# Patient Record
Sex: Female | Born: 1960 | Race: Black or African American | Hispanic: No | Marital: Single | Smoking: Current every day smoker
Health system: Southern US, Community
[De-identification: ages and names within clinical notes are randomized; demographics above are authoritative.]

## PROBLEM LIST (undated history)

## (undated) DIAGNOSIS — H15003 Unspecified scleritis, bilateral: Secondary | ICD-10-CM

## (undated) DIAGNOSIS — E876 Hypokalemia: Secondary | ICD-10-CM

## (undated) DIAGNOSIS — T884XXA Failed or difficult intubation, initial encounter: Secondary | ICD-10-CM

## (undated) DIAGNOSIS — H409 Unspecified glaucoma: Secondary | ICD-10-CM

## (undated) DIAGNOSIS — R011 Cardiac murmur, unspecified: Secondary | ICD-10-CM

## (undated) DIAGNOSIS — D689 Coagulation defect, unspecified: Secondary | ICD-10-CM

## (undated) DIAGNOSIS — K219 Gastro-esophageal reflux disease without esophagitis: Secondary | ICD-10-CM

## (undated) DIAGNOSIS — M1990 Unspecified osteoarthritis, unspecified site: Secondary | ICD-10-CM

## (undated) DIAGNOSIS — K5900 Constipation, unspecified: Secondary | ICD-10-CM

## (undated) DIAGNOSIS — Z86718 Personal history of other venous thrombosis and embolism: Secondary | ICD-10-CM

## (undated) DIAGNOSIS — M7989 Other specified soft tissue disorders: Secondary | ICD-10-CM

## (undated) DIAGNOSIS — F259 Schizoaffective disorder, unspecified: Secondary | ICD-10-CM

## (undated) DIAGNOSIS — L732 Hidradenitis suppurativa: Secondary | ICD-10-CM

## (undated) DIAGNOSIS — E119 Type 2 diabetes mellitus without complications: Secondary | ICD-10-CM

## (undated) DIAGNOSIS — F32A Depression, unspecified: Secondary | ICD-10-CM

## (undated) DIAGNOSIS — F419 Anxiety disorder, unspecified: Secondary | ICD-10-CM

## (undated) DIAGNOSIS — G709 Myoneural disorder, unspecified: Secondary | ICD-10-CM

## (undated) DIAGNOSIS — F329 Major depressive disorder, single episode, unspecified: Secondary | ICD-10-CM

## (undated) DIAGNOSIS — I10 Essential (primary) hypertension: Secondary | ICD-10-CM

## (undated) DIAGNOSIS — M359 Systemic involvement of connective tissue, unspecified: Secondary | ICD-10-CM

## (undated) DIAGNOSIS — G4730 Sleep apnea, unspecified: Secondary | ICD-10-CM

## (undated) DIAGNOSIS — E785 Hyperlipidemia, unspecified: Secondary | ICD-10-CM

## (undated) DIAGNOSIS — K2970 Gastritis, unspecified, without bleeding: Secondary | ICD-10-CM

## (undated) DIAGNOSIS — T8859XA Other complications of anesthesia, initial encounter: Secondary | ICD-10-CM

## (undated) DIAGNOSIS — I82409 Acute embolism and thrombosis of unspecified deep veins of unspecified lower extremity: Secondary | ICD-10-CM

## (undated) DIAGNOSIS — E2601 Conn's syndrome: Secondary | ICD-10-CM

## (undated) DIAGNOSIS — F429 Obsessive-compulsive disorder, unspecified: Secondary | ICD-10-CM

## (undated) DIAGNOSIS — G5600 Carpal tunnel syndrome, unspecified upper limb: Secondary | ICD-10-CM

## (undated) DIAGNOSIS — N289 Disorder of kidney and ureter, unspecified: Secondary | ICD-10-CM

## (undated) DIAGNOSIS — D6861 Antiphospholipid syndrome: Secondary | ICD-10-CM

## (undated) HISTORY — DX: Obsessive-compulsive disorder, unspecified: F42.9

## (undated) HISTORY — DX: Disorder of kidney and ureter, unspecified: N28.9

## (undated) HISTORY — PX: BUNIONECTOMY: SHX129

## (undated) HISTORY — DX: Unspecified osteoarthritis, unspecified site: M19.90

## (undated) HISTORY — DX: Anxiety disorder, unspecified: F41.9

## (undated) HISTORY — DX: Type 2 diabetes mellitus without complications: E11.9

## (undated) HISTORY — DX: Other specified soft tissue disorders: M79.89

## (undated) HISTORY — DX: Personal history of other venous thrombosis and embolism: Z86.718

## (undated) HISTORY — DX: Constipation, unspecified: K59.00

## (undated) HISTORY — DX: Schizoaffective disorder, unspecified: F25.9

## (undated) HISTORY — PX: LYMPH NODE DISSECTION: SHX5087

## (undated) HISTORY — DX: Hyperlipidemia, unspecified: E78.5

## (undated) HISTORY — DX: Unspecified scleritis, bilateral: H15.003

## (undated) HISTORY — DX: Coagulation defect, unspecified: D68.9

## (undated) HISTORY — DX: Systemic involvement of connective tissue, unspecified: M35.9

## (undated) HISTORY — DX: Carpal tunnel syndrome, unspecified upper limb: G56.00

## (undated) HISTORY — PX: CERVICAL CONIZATION W/BX: SHX1330

## (undated) HISTORY — DX: Conn's syndrome: E26.01

---
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
## 2001-01-20 ENCOUNTER — Other Ambulatory Visit (HOSPITAL_COMMUNITY): Admission: RE | Admit: 2001-01-20 | Discharge: 2001-02-01 | Payer: Self-pay | Admitting: *Deleted

## 2001-12-23 ENCOUNTER — Emergency Department (HOSPITAL_COMMUNITY): Admission: EM | Admit: 2001-12-23 | Discharge: 2001-12-23 | Payer: Self-pay | Admitting: Emergency Medicine

## 2010-11-10 ENCOUNTER — Inpatient Hospital Stay (INDEPENDENT_AMBULATORY_CARE_PROVIDER_SITE_OTHER)
Admission: RE | Admit: 2010-11-10 | Discharge: 2010-11-10 | Disposition: A | Discharge status: Home / Self Care | Payer: Medicare Other | Source: Ambulatory Visit | Attending: Emergency Medicine | Admitting: Emergency Medicine

## 2010-11-10 DIAGNOSIS — G609 Hereditary and idiopathic neuropathy, unspecified: Secondary | ICD-10-CM

## 2010-11-10 LAB — POCT I-STAT, CHEM 8
BUN: 9 mg/dL (ref 6–23)
Chloride: 103 mEq/L (ref 96–112)
HCT: 44 % (ref 36.0–46.0)
Sodium: 141 mEq/L (ref 135–145)

## 2010-11-11 LAB — VITAMIN B12: Vitamin B-12: 1144 pg/mL — ABNORMAL HIGH (ref 211–911)

## 2010-11-19 ENCOUNTER — Emergency Department (HOSPITAL_COMMUNITY)
Admission: EM | Admit: 2010-11-19 | Discharge: 2010-11-19 | Disposition: A | Discharge status: Home / Self Care | Payer: Medicare Other | Attending: Emergency Medicine | Admitting: Emergency Medicine

## 2010-11-19 DIAGNOSIS — H409 Unspecified glaucoma: Secondary | ICD-10-CM | POA: Insufficient documentation

## 2010-11-19 DIAGNOSIS — I10 Essential (primary) hypertension: Secondary | ICD-10-CM | POA: Insufficient documentation

## 2010-11-19 DIAGNOSIS — Z79899 Other long term (current) drug therapy: Secondary | ICD-10-CM | POA: Insufficient documentation

## 2010-11-19 DIAGNOSIS — R209 Unspecified disturbances of skin sensation: Secondary | ICD-10-CM | POA: Insufficient documentation

## 2010-11-19 DIAGNOSIS — G5690 Unspecified mononeuropathy of unspecified upper limb: Secondary | ICD-10-CM | POA: Insufficient documentation

## 2012-04-19 DIAGNOSIS — I82409 Acute embolism and thrombosis of unspecified deep veins of unspecified lower extremity: Secondary | ICD-10-CM

## 2012-04-19 HISTORY — DX: Acute embolism and thrombosis of unspecified deep veins of unspecified lower extremity: I82.409

## 2012-06-10 ENCOUNTER — Emergency Department (HOSPITAL_COMMUNITY)
Admission: EM | Admit: 2012-06-10 | Discharge: 2012-06-10 | Disposition: A | Discharge status: Home / Self Care | Payer: Medicare Other | Attending: Emergency Medicine | Admitting: Emergency Medicine

## 2012-06-10 ENCOUNTER — Encounter (HOSPITAL_COMMUNITY): Payer: Self-pay

## 2012-06-10 DIAGNOSIS — Z8669 Personal history of other diseases of the nervous system and sense organs: Secondary | ICD-10-CM | POA: Insufficient documentation

## 2012-06-10 DIAGNOSIS — I809 Phlebitis and thrombophlebitis of unspecified site: Secondary | ICD-10-CM | POA: Insufficient documentation

## 2012-06-10 DIAGNOSIS — I8000 Phlebitis and thrombophlebitis of superficial vessels of unspecified lower extremity: Secondary | ICD-10-CM

## 2012-06-10 DIAGNOSIS — Z8719 Personal history of other diseases of the digestive system: Secondary | ICD-10-CM | POA: Insufficient documentation

## 2012-06-10 DIAGNOSIS — F17200 Nicotine dependence, unspecified, uncomplicated: Secondary | ICD-10-CM | POA: Insufficient documentation

## 2012-06-10 DIAGNOSIS — I10 Essential (primary) hypertension: Secondary | ICD-10-CM | POA: Insufficient documentation

## 2012-06-10 DIAGNOSIS — M79609 Pain in unspecified limb: Secondary | ICD-10-CM

## 2012-06-10 DIAGNOSIS — I8390 Asymptomatic varicose veins of unspecified lower extremity: Secondary | ICD-10-CM | POA: Insufficient documentation

## 2012-06-10 DIAGNOSIS — R209 Unspecified disturbances of skin sensation: Secondary | ICD-10-CM | POA: Insufficient documentation

## 2012-06-10 DIAGNOSIS — Z8659 Personal history of other mental and behavioral disorders: Secondary | ICD-10-CM | POA: Insufficient documentation

## 2012-06-10 HISTORY — DX: Gastritis, unspecified, without bleeding: K29.70

## 2012-06-10 HISTORY — DX: Essential (primary) hypertension: I10

## 2012-06-10 HISTORY — DX: Schizoaffective disorder, unspecified: F25.9

## 2012-06-10 HISTORY — DX: Unspecified glaucoma: H40.9

## 2012-06-10 HISTORY — DX: Depression, unspecified: F32.A

## 2012-06-10 HISTORY — DX: Major depressive disorder, single episode, unspecified: F32.9

## 2012-06-10 MED ORDER — IBUPROFEN 800 MG PO TABS: 800.0000 mg | ORAL_TABLET | Freq: Three times a day (TID) | ORAL | Status: DC

## 2012-06-10 MED ORDER — HYDROCODONE-ACETAMINOPHEN 5-325 MG PO TABS: 2.0000 | ORAL_TABLET | ORAL | Status: DC | PRN

## 2012-06-10 MED ORDER — CEPHALEXIN 500 MG PO CAPS: 500.0000 mg | ORAL_CAPSULE | Freq: Four times a day (QID) | ORAL | Status: DC

## 2012-06-10 NOTE — ED Notes (Signed)
Patient presents with possible ruptured varicose vein to left lower extremity with tenderness, erythema since Friday evening.  DP and PT pulses 2+, patient ambulatory.  No history of DVT or blood clotting, positive family history.

## 2012-06-10 NOTE — ED Notes (Signed)
Pt discharged by another ED nurse

## 2012-06-10 NOTE — ED Provider Notes (Signed)
History     CSN: [id number]  Arrival date & time 06/10/12  0745   First MD Initiated Contact with Patient 06/10/12 [phone]      Chief Complaint  Patient presents with  . Leg Swelling    (Consider location/radiation/quality/duration/timing/severity/associated sxs/prior treatment) Patient is a 52 y.o. female presenting with extremity pain. The history is provided by the patient.  Extremity Pain This is a new problem. The current episode started yesterday. The problem has been gradually worsening. Associated symptoms include numbness. Pertinent negatives include no chest pain, diaphoresis or fever. Associated symptoms comments: Patient started having pain at 9pm located in the location of a chronic varicosity in her left lower leg. She denies having symptoms like this before. New numbness in left foot that began at 2am. She called her insurance nurse who told her to elevate her leg and come to the ER if no improvement. . The symptoms are aggravated by walking. Treatments tried: elevation of extremity. The treatment provided no relief.    Past Medical History  Diagnosis Date  . Hypertension   . Glaucoma   . Gastritis   . Schizo-affective psychosis   . Depression     Past Surgical History  Procedure Laterality Date  . Cervical conization w/bx    . Bunionectomy      No family history on file.  History  Substance Use Topics  . Smoking status: Current Every Day Smoker  . Smokeless tobacco: Not on file  . Alcohol Use: Yes    OB History   Grav Para Term Preterm Abortions TAB SAB Ect Mult Living                  Review of Systems  Constitutional: Negative for fever and diaphoresis.  Respiratory: Negative for shortness of breath.   Cardiovascular: Negative for chest pain.  Neurological: Positive for numbness.    Allergies  Ace inhibitors and Travatan  Home Medications  No current outpatient prescriptions on file.  BP 125/75  Temp(Src) 97.7 F (36.5 C) (Oral)  SpO2  98%  Physical Exam  Constitutional: She appears well-developed.  HENT:  Head: Normocephalic and atraumatic.  Neck: No tracheal deviation present.  Cardiovascular: Normal rate, regular rhythm, normal heart sounds and intact distal pulses.  Exam reveals no gallop and no friction rub.   No murmur heard. Pulmonary/Chest: Effort normal and breath sounds normal. No respiratory distress. She has no wheezes. She has no rales.  Musculoskeletal: She exhibits tenderness.  Tenderness on left posterior calf - tenderness over mildly erythematous 3cm lump on medial left lower leg  Neurological: She is alert.  Skin: Skin is warm and dry. There is erythema.  Mild erythema over left medial calf  Psychiatric: She has a normal mood and affect. Her behavior is normal.    ED Course  Procedures (including critical care time)  Labs Reviewed - No data to display No results found.   No diagnosis found. Superficial thrombophebitis Hx of varicose veins    MDM  Patient declines pain medications for now.   Venous doppler done and DVT ruled out. Evidence of superficial thrombophlebitis. Dr. Effie Shy in to see patient. Will advise warm compresses, elevation, anti-inflammatories. Will cover with an antibiotic and encourage follow up.   Mora Bellman, PA-C 06/10/12 1000

## 2012-06-10 NOTE — ED Provider Notes (Signed)
Jeanne [name] is a 52 y.o. female who is here for evaluation of left leg pain, with swelling. Onset is acute and pain is persistent.   Exam: Tender, red nodule, left medial calf. No fluctuance.  Doppler left leg. Negative for DVT  Evaluation is consistent with superficial thrombophlebitis.   Medical screening examination/treatment/procedure(s) were conducted as a shared visit with non-physician practitioner(s) and myself.  I personally evaluated the patient during the encounter  Flint Melter, MD 06/11/12 [phone]

## 2012-06-10 NOTE — Progress Notes (Signed)
VASCULAR LAB PRELIMINARY  PRELIMINARY  PRELIMINARY  PRELIMINARY  Left lower extremity venous Doppler completed.    Preliminary report:  There is no DVT noted in the left lower extremity.  There is superficial thrombosis noted in the greater saphenous vein at site of knot and tenderness.  Christyan Reger, RVT 06/10/2012, 9:20 AM

## 2012-06-19 ENCOUNTER — Encounter (INDEPENDENT_AMBULATORY_CARE_PROVIDER_SITE_OTHER): Payer: Medicare Other | Admitting: *Deleted

## 2012-06-19 DIAGNOSIS — I80299 Phlebitis and thrombophlebitis of other deep vessels of unspecified lower extremity: Secondary | ICD-10-CM

## 2012-06-19 DIAGNOSIS — R609 Edema, unspecified: Secondary | ICD-10-CM

## 2012-12-05 ENCOUNTER — Emergency Department (HOSPITAL_COMMUNITY)
Admission: EM | Admit: 2012-12-05 | Discharge: 2012-12-05 | Disposition: A | Discharge status: Home / Self Care | Payer: Medicare Other | Attending: Emergency Medicine | Admitting: Emergency Medicine

## 2012-12-05 ENCOUNTER — Encounter (HOSPITAL_COMMUNITY): Payer: Self-pay | Admitting: *Deleted

## 2012-12-05 ENCOUNTER — Emergency Department (HOSPITAL_COMMUNITY): Payer: Medicare Other

## 2012-12-05 DIAGNOSIS — Z8669 Personal history of other diseases of the nervous system and sense organs: Secondary | ICD-10-CM | POA: Insufficient documentation

## 2012-12-05 DIAGNOSIS — R202 Paresthesia of skin: Secondary | ICD-10-CM

## 2012-12-05 DIAGNOSIS — E876 Hypokalemia: Secondary | ICD-10-CM

## 2012-12-05 DIAGNOSIS — F259 Schizoaffective disorder, unspecified: Secondary | ICD-10-CM | POA: Insufficient documentation

## 2012-12-05 DIAGNOSIS — Z8719 Personal history of other diseases of the digestive system: Secondary | ICD-10-CM | POA: Insufficient documentation

## 2012-12-05 DIAGNOSIS — I10 Essential (primary) hypertension: Secondary | ICD-10-CM | POA: Insufficient documentation

## 2012-12-05 DIAGNOSIS — R42 Dizziness and giddiness: Secondary | ICD-10-CM | POA: Insufficient documentation

## 2012-12-05 DIAGNOSIS — Z79899 Other long term (current) drug therapy: Secondary | ICD-10-CM | POA: Insufficient documentation

## 2012-12-05 DIAGNOSIS — F3289 Other specified depressive episodes: Secondary | ICD-10-CM | POA: Insufficient documentation

## 2012-12-05 DIAGNOSIS — F329 Major depressive disorder, single episode, unspecified: Secondary | ICD-10-CM | POA: Insufficient documentation

## 2012-12-05 DIAGNOSIS — F17200 Nicotine dependence, unspecified, uncomplicated: Secondary | ICD-10-CM | POA: Insufficient documentation

## 2012-12-05 DIAGNOSIS — R209 Unspecified disturbances of skin sensation: Secondary | ICD-10-CM | POA: Insufficient documentation

## 2012-12-05 DIAGNOSIS — Z86718 Personal history of other venous thrombosis and embolism: Secondary | ICD-10-CM | POA: Insufficient documentation

## 2012-12-05 DIAGNOSIS — R109 Unspecified abdominal pain: Secondary | ICD-10-CM | POA: Insufficient documentation

## 2012-12-05 DIAGNOSIS — Z7901 Long term (current) use of anticoagulants: Secondary | ICD-10-CM | POA: Insufficient documentation

## 2012-12-05 HISTORY — DX: Acute embolism and thrombosis of unspecified deep veins of unspecified lower extremity: I82.409

## 2012-12-05 LAB — COMPREHENSIVE METABOLIC PANEL
AST: 28 U/L (ref 0–37)
Albumin: 3.5 g/dL (ref 3.5–5.2)
Alkaline Phosphatase: 119 U/L — ABNORMAL HIGH (ref 39–117)
CO2: 30 mEq/L (ref 19–32)
Chloride: 102 mEq/L (ref 96–112)
Creatinine, Ser: 0.54 mg/dL (ref 0.50–1.10)
GFR calc non Af Amer: >90 mL/min (ref >90)
Potassium: 2.8 mEq/L — ABNORMAL LOW (ref 3.5–5.1)
Total Bilirubin: 0.4 mg/dL (ref 0.3–1.2)

## 2012-12-05 LAB — URINALYSIS, ROUTINE W REFLEX MICROSCOPIC
Bilirubin Urine: NEGATIVE
Glucose, UA: NEGATIVE mg/dL
Hgb urine dipstick: NEGATIVE
Specific Gravity, Urine: 1.011 (ref 1.005–1.030)
Urobilinogen, UA: 0.2 mg/dL (ref 0.0–1.0)
pH: 7.5 (ref 5.0–8.0)

## 2012-12-05 LAB — CBC WITH DIFFERENTIAL/PLATELET
Basophils Absolute: 0 10*3/uL (ref 0.0–0.1)
HCT: 41.9 % (ref 36.0–46.0)
Hemoglobin: 14.9 g/dL (ref 12.0–15.0)
Lymphocytes Relative: 31 % (ref 12–46)
Monocytes Absolute: 0.5 10*3/uL (ref 0.1–1.0)
Monocytes Relative: 12 % (ref 3–12)
Neutro Abs: 2.5 10*3/uL (ref 1.7–7.7)
Neutrophils Relative %: 54 % (ref 43–77)
RDW: 13.4 % (ref 11.5–15.5)
WBC: 4.7 10*3/uL (ref 4.0–10.5)

## 2012-12-05 LAB — PROTIME-INR: INR: 1.11 (ref 0.00–1.49)

## 2012-12-05 MED ORDER — POTASSIUM CHLORIDE CRYS ER 20 MEQ PO TBCR
40.0000 meq | EXTENDED_RELEASE_TABLET | Freq: Once | ORAL | Status: AC
  Administered 2012-12-05: 40 meq via ORAL
  Filled 2012-12-05: qty 2

## 2012-12-05 MED ORDER — POTASSIUM CHLORIDE ER 10 MEQ PO TBCR: 10.0000 meq | EXTENDED_RELEASE_TABLET | ORAL | Status: DC

## 2012-12-05 MED ORDER — SODIUM CHLORIDE 0.9 % IV SOLN
Freq: Once | INTRAVENOUS | Status: AC
  Administered 2012-12-05: 03:00:00 via INTRAVENOUS

## 2012-12-05 NOTE — ED Provider Notes (Signed)
CSN: [id number]     Arrival date & time 12/05/12  0243 History     First MD Initiated Contact with Patient 12/05/12 0257     Chief Complaint  Patient presents with  . Abdominal Pain  . facial and neck numbness    (Consider location/radiation/quality/duration/timing/severity/associated sxs/prior Treatment) HPI Comments: Patient states she was awakened about midnight with right sided pain, and bloating.  This has since resolved, but now she is, "sore" but now the patient.  Has noticed, that her chin, and anterior neck, is numb , and has slight dizziness: denies any recent fevers, URI, symptoms, UTI symptoms, chest pain, shortness of breath.  Cold trauma visual disturbance, headache.  Patient is a 52 y.o. female presenting with abdominal pain. The history is provided by the patient.  Abdominal Pain Pain location: Right side. Pain quality: aching and bloating   Pain radiates to:  Does not radiate Pain severity:  Mild Onset quality:  Sudden Timing:  Unable to specify Progression:  Improving Chronicity:  New Relieved by: Time. Worsened by:  Nothing tried Ineffective treatments:  None tried Associated symptoms: no anorexia, no belching, no chest pain, no chills, no cough, no diarrhea, no dysuria, no fatigue, no fever, no flatus, no hematemesis, no melena, no nausea, no shortness of breath, no sore throat, no vaginal bleeding and no vaginal discharge   Associated symptoms comment:  Now has numbness of her chin, and anterior neck   Past Medical History  Diagnosis Date  . Hypertension   . Glaucoma   . Gastritis   . Schizo-affective psychosis   . Depression   . DVT (deep venous thrombosis)     on Xaralto   Past Surgical History  Procedure Laterality Date  . Cervical conization w/bx    . Bunionectomy     No family history on file. History  Substance Use Topics  . Smoking status: Current Every Day Smoker  . Smokeless tobacco: Not on file  . Alcohol Use: Yes   OB History   Grav Para Term Preterm Abortions TAB SAB Ect Mult Living                 Review of Systems  Constitutional: Negative for fever, chills and fatigue.  HENT: Negative for sore throat, neck pain and neck stiffness.   Respiratory: Negative for cough and shortness of breath.   Cardiovascular: Negative for chest pain.  Gastrointestinal: Positive for abdominal pain. Negative for nausea, diarrhea, melena, anorexia, flatus and hematemesis.  Genitourinary: Negative for dysuria, vaginal bleeding and vaginal discharge.  Neurological: Positive for dizziness. Negative for facial asymmetry, speech difficulty, weakness and headaches.    Allergies  Ace inhibitors and Travatan  Home Medications   Current Outpatient Rx  Name  Route  Sig  Dispense  Refill  . amLODipine (NORVASC) 5 MG tablet   Oral   Take 5 mg by mouth daily.         . ARIPiprazole (ABILIFY) 30 MG tablet   Oral   Take 30 mg by mouth daily.         . celecoxib (CELEBREX) 200 MG capsule   Oral   Take 200 mg by mouth daily.         [name] desvenlafaxine (PRISTIQ) 50 MG 24 hr tablet   Oral   Take 50 mg by mouth daily.         [name] eplerenone (INSPRA) 50 MG tablet   Oral   Take 50 mg by mouth 2 (two) times daily.         [name]  esomeprazole (NEXIUM) 40 MG capsule   Oral   Take 40 mg by mouth daily before breakfast.         . folic acid (FOLVITE) 1 MG tablet   Oral   Take 1 mg by mouth daily.         [name] latanoprost (XALATAN) 0.005 % ophthalmic solution   Both Eyes   Place 1 drop into both eyes at bedtime.         . methotrexate (RHEUMATREX) 2.5 MG tablet   Oral   Take 10 mg by mouth every 7 (seven) days. Mondays         . NIFEdipine (PROCARDIA XL) 60 MG 24 hr tablet   Oral   Take 120 mg by mouth daily.         . QUEtiapine (SEROQUEL) 300 MG tablet   Oral   Take 600 mg by mouth at bedtime.         . Rivaroxaban (XARELTO) 20 MG TABS tablet   Oral   Take 20 mg by mouth daily.         . verapamil  (CALAN-SR) 240 MG CR tablet   Oral   Take 240 mg by mouth daily.          . potassium chloride (K-DUR) 10 MEQ tablet   Oral   Take 1 tablet (10 mEq total) by mouth 1 day or 1 dose.   4 tablet   0    BP 125/56  Pulse 72  Temp(Src) 98.6 F (37 C) (Oral)  Resp 11  SpO2 99% Physical Exam  Nursing note and vitals reviewed. Constitutional: She appears well-developed and well-nourished.  HENT:  Head: Normocephalic.  Left Ear: External ear normal.  Eyes: Pupils are equal, round, and reactive to light.  Neck: Normal range of motion.  Cardiovascular: Normal rate and regular rhythm.   Pulmonary/Chest: Effort normal and breath sounds normal.  Abdominal: Soft. Bowel sounds are normal. She exhibits no distension. There is tenderness in the right upper quadrant and epigastric area.  Mild tenderness  Musculoskeletal: Normal range of motion.  Neurological: She is alert.  Skin: Skin is warm.    ED Course   Procedures (including critical care time)  Labs Reviewed  COMPREHENSIVE METABOLIC PANEL - Abnormal; Notable for the following:    Potassium 2.8 (*)    Glucose, Bld 122 (*)    Alkaline Phosphatase 119 (*)    All other components within normal limits  URINALYSIS, ROUTINE W REFLEX MICROSCOPIC - Abnormal; Notable for the following:    APPearance HAZY (*)    All other components within normal limits  CBC WITH DIFFERENTIAL  PROTIME-INR  TROPONIN I   Dg Chest 2 View  12/05/2012   *RADIOLOGY REPORT*  Clinical Data: Right-sided chest pain.  CHEST - 2 VIEW  Comparison: None.  Findings: Shallow inspiration.  Mild cardiac enlargement. Pulmonary vascularity is borderline but probably normal for technique.  No focal consolidation or airspace disease.  No blunting of costophrenic angles.  No pneumothorax.  Mediastinal contours appear intact.  Calcified and tortuous aorta.  IMPRESSION: Mild cardiac enlargement.  No evidence of active consolidation.   Original Report Authenticated By: Burman Nieves, M.D.   1. Paresthesia   2. Hypokalemia     Date: 12/05/2012  Rate: 67  Rhythm: normal sinus rhythm  QRS Axis: normal  Intervals: normal  ST/T Wave abnormalities: nonspecific T wave changes  Conduction Disutrbances:none  Narrative Interpretation: borderline  Old EKG Reviewed: none available  MDM  Patient's hypokalemia was treated in emergency milliequivalents of K. per by mouth.  She has been discharged home with a prescription for 20 mg milliequivalents daily.  For the next 4 days, and instructed to increase her dietary potassium.  Follow up with her primary care physician for recheck of her level.  Next week  Arman Filter, NP 12/05/12 [phone]

## 2012-12-05 NOTE — ED Notes (Signed)
Pt states was woke with right lateral abdominal pain and upper abdominal bloating and then developed facial and bilateral neck numbness.  No chest pain.

## 2012-12-05 NOTE — ED Notes (Signed)
NSR on monitor.

## 2012-12-05 NOTE — ED Provider Notes (Signed)
Medical screening examination/treatment/procedure(s) were performed by non-physician practitioner and as supervising physician I was immediately available for consultation/collaboration.   Latoi Giraldo B. Bernette Mayers, MD 12/05/12 [phone]

## 2013-01-24 ENCOUNTER — Other Ambulatory Visit: Payer: Self-pay

## 2013-01-24 DIAGNOSIS — Z1231 Encounter for screening mammogram for malignant neoplasm of breast: Secondary | ICD-10-CM

## 2013-02-19 ENCOUNTER — Telehealth: Payer: Self-pay | Admitting: Cardiology

## 2013-02-19 ENCOUNTER — Telehealth: Payer: Self-pay | Admitting: Hematology and Oncology

## 2013-02-19 NOTE — Telephone Encounter (Signed)
Called pt and gve np appt 11/19 @ 10:30 w/Dr Bertis Ruddy Referring Dr. Link Snuffer Dx- Thrombophilia Welcome packet mailed.

## 2013-02-19 NOTE — Telephone Encounter (Signed)
C/D 02/19/13 for appt. 03/07/13

## 2013-02-26 ENCOUNTER — Encounter: Payer: Self-pay | Admitting: Hematology and Oncology

## 2013-02-26 NOTE — Progress Notes (Signed)
Patient called and left message. I called and left her a message.

## 2013-02-27 ENCOUNTER — Encounter: Payer: Self-pay | Admitting: Hematology and Oncology

## 2013-02-27 NOTE — Progress Notes (Signed)
Had to leave the patient a message again. She called and left me a message.

## 2013-03-05 ENCOUNTER — Inpatient Hospital Stay (HOSPITAL_COMMUNITY)
Admission: EM | Admit: 2013-03-05 | Discharge: 2013-03-07 | DRG: 641 | Disposition: A | Discharge status: Home / Self Care | Payer: Medicare Other | Attending: Internal Medicine | Admitting: Internal Medicine

## 2013-03-05 ENCOUNTER — Telehealth: Payer: Self-pay | Admitting: Hematology and Oncology

## 2013-03-05 ENCOUNTER — Encounter (HOSPITAL_COMMUNITY): Payer: Self-pay | Admitting: Emergency Medicine

## 2013-03-05 DIAGNOSIS — F32A Depression, unspecified: Secondary | ICD-10-CM | POA: Diagnosis present

## 2013-03-05 DIAGNOSIS — Z833 Family history of diabetes mellitus: Secondary | ICD-10-CM

## 2013-03-05 DIAGNOSIS — I159 Secondary hypertension, unspecified: Secondary | ICD-10-CM | POA: Diagnosis present

## 2013-03-05 DIAGNOSIS — F17200 Nicotine dependence, unspecified, uncomplicated: Secondary | ICD-10-CM | POA: Diagnosis present

## 2013-03-05 DIAGNOSIS — I10 Essential (primary) hypertension: Secondary | ICD-10-CM | POA: Diagnosis present

## 2013-03-05 DIAGNOSIS — F429 Obsessive-compulsive disorder, unspecified: Secondary | ICD-10-CM | POA: Diagnosis present

## 2013-03-05 DIAGNOSIS — Z79899 Other long term (current) drug therapy: Secondary | ICD-10-CM

## 2013-03-05 DIAGNOSIS — H409 Unspecified glaucoma: Secondary | ICD-10-CM | POA: Diagnosis present

## 2013-03-05 DIAGNOSIS — F259 Schizoaffective disorder, unspecified: Secondary | ICD-10-CM | POA: Diagnosis present

## 2013-03-05 DIAGNOSIS — R9431 Abnormal electrocardiogram [ECG] [EKG]: Secondary | ICD-10-CM | POA: Diagnosis present

## 2013-03-05 DIAGNOSIS — Z86718 Personal history of other venous thrombosis and embolism: Secondary | ICD-10-CM

## 2013-03-05 DIAGNOSIS — F329 Major depressive disorder, single episode, unspecified: Secondary | ICD-10-CM | POA: Diagnosis present

## 2013-03-05 DIAGNOSIS — E873 Alkalosis: Secondary | ICD-10-CM | POA: Diagnosis present

## 2013-03-05 DIAGNOSIS — Z823 Family history of stroke: Secondary | ICD-10-CM

## 2013-03-05 DIAGNOSIS — E876 Hypokalemia: Principal | ICD-10-CM | POA: Diagnosis present

## 2013-03-05 DIAGNOSIS — Z803 Family history of malignant neoplasm of breast: Secondary | ICD-10-CM

## 2013-03-05 DIAGNOSIS — R209 Unspecified disturbances of skin sensation: Secondary | ICD-10-CM | POA: Diagnosis present

## 2013-03-05 DIAGNOSIS — R202 Paresthesia of skin: Secondary | ICD-10-CM | POA: Diagnosis present

## 2013-03-05 DIAGNOSIS — G4733 Obstructive sleep apnea (adult) (pediatric): Secondary | ICD-10-CM | POA: Diagnosis present

## 2013-03-05 DIAGNOSIS — M359 Systemic involvement of connective tissue, unspecified: Secondary | ICD-10-CM | POA: Diagnosis present

## 2013-03-05 HISTORY — DX: Hypokalemia: E87.6

## 2013-03-05 HISTORY — DX: Myoneural disorder, unspecified: G70.9

## 2013-03-05 HISTORY — DX: Sleep apnea, unspecified: G47.30

## 2013-03-05 HISTORY — DX: Unspecified osteoarthritis, unspecified site: M19.90

## 2013-03-05 HISTORY — DX: Cardiac murmur, unspecified: R01.1

## 2013-03-05 LAB — BASIC METABOLIC PANEL
CO2: 29 mEq/L (ref 19–32)
CO2: 28 mEq/L (ref 19–32)
Calcium: 9 mg/dL (ref 8.4–10.5)
Chloride: 102 mEq/L (ref 96–112)
Creatinine, Ser: 0.59 mg/dL (ref 0.50–1.10)
GFR calc Af Amer: >90 mL/min (ref >90)
GFR calc non Af Amer: >90 mL/min (ref >90)
Potassium: 2.9 mEq/L — ABNORMAL LOW (ref 3.5–5.1)
Sodium: 140 mEq/L (ref 135–145)
Sodium: 139 mEq/L (ref 135–145)

## 2013-03-05 LAB — COMPREHENSIVE METABOLIC PANEL
ALT: 23 U/L (ref 0–35)
AST: 24 U/L (ref 0–37)
Albumin: 3.7 g/dL (ref 3.5–5.2)
Alkaline Phosphatase: 123 U/L — ABNORMAL HIGH (ref 39–117)
BUN: 11 mg/dL (ref 6–23)
CO2: 29 mEq/L (ref 19–32)
Calcium: 8.7 mg/dL (ref 8.4–10.5)
Chloride: 101 mEq/L (ref 96–112)
Creatinine, Ser: 0.58 mg/dL (ref 0.50–1.10)
GFR calc Af Amer: >90 mL/min (ref >90)
GFR calc non Af Amer: >90 mL/min (ref >90)
Glucose, Bld: 111 mg/dL — ABNORMAL HIGH (ref 70–99)
Potassium: 2.4 mEq/L — CL (ref 3.5–5.1)
Sodium: 141 mEq/L (ref 135–145)
Total Bilirubin: 0.4 mg/dL (ref 0.3–1.2)
Total Protein: 7.6 g/dL (ref 6.0–8.3)

## 2013-03-05 LAB — CBC
HCT: 42.7 % (ref 36.0–46.0)
Hemoglobin: 15.6 g/dL — ABNORMAL HIGH (ref 12.0–15.0)
MCH: 34.3 pg — ABNORMAL HIGH (ref 26.0–34.0)
MCV: 93.8 fL (ref 78.0–100.0)
Platelets: 179 10*3/uL (ref 150–400)
RBC: 4.55 MIL/uL (ref 3.87–5.11)
WBC: 5.2 10*3/uL (ref 4.0–10.5)

## 2013-03-05 LAB — TROPONIN I: Troponin I: <0.3 ng/mL (ref <0.30)

## 2013-03-05 LAB — VITAMIN B12: Vitamin B-12: 1352 pg/mL — ABNORMAL HIGH (ref 211–911)

## 2013-03-05 LAB — TSH: TSH: 1.639 u[IU]/mL (ref 0.350–4.500)

## 2013-03-05 MED ORDER — ARIPIPRAZOLE 15 MG PO TABS
30.0000 mg | ORAL_TABLET | Freq: Every day | ORAL | Status: DC
  Filled 2013-03-05: qty 2

## 2013-03-05 MED ORDER — VERAPAMIL HCL ER 240 MG PO TBCR
240.0000 mg | EXTENDED_RELEASE_TABLET | Freq: Every day | ORAL | Status: DC
Start: 2013-03-05 — End: 2013-03-07
  Administered 2013-03-05 – 2013-03-07 (×3): 240 mg via ORAL
  Filled 2013-03-05 (×4): qty 1

## 2013-03-05 MED ORDER — POTASSIUM CHLORIDE 10 MEQ/100ML IV SOLN
10.0000 meq | Freq: Once | INTRAVENOUS | Status: AC
  Administered 2013-03-05: 10 meq via INTRAVENOUS
  Filled 2013-03-05: qty 100

## 2013-03-05 MED ORDER — VENLAFAXINE HCL ER 75 MG PO CP24
75.0000 mg | ORAL_CAPSULE | Freq: Every day | ORAL | Status: DC
  Administered 2013-03-05 – 2013-03-07 (×3): 75 mg via ORAL
  Filled 2013-03-05 (×5): qty 1

## 2013-03-05 MED ORDER — POTASSIUM CHLORIDE 10 MEQ/100ML IV SOLN
10.0000 meq | INTRAVENOUS | Status: AC
  Administered 2013-03-05 (×4): 10 meq via INTRAVENOUS
  Filled 2013-03-05 (×4): qty 100

## 2013-03-05 MED ORDER — CELECOXIB 200 MG PO CAPS
200.0000 mg | ORAL_CAPSULE | Freq: Every day | ORAL | Status: DC
  Administered 2013-03-05 – 2013-03-07 (×3): 200 mg via ORAL
  Filled 2013-03-05 (×3): qty 1

## 2013-03-05 MED ORDER — QUETIAPINE FUMARATE 300 MG PO TABS
600.0000 mg | ORAL_TABLET | Freq: Every day | ORAL | Status: DC
  Administered 2013-03-05 – 2013-03-06 (×2): 600 mg via ORAL
  Filled 2013-03-05 (×6): qty 2

## 2013-03-05 MED ORDER — PANTOPRAZOLE SODIUM 40 MG PO TBEC
40.0000 mg | DELAYED_RELEASE_TABLET | Freq: Every day | ORAL | Status: DC
  Administered 2013-03-05 – 2013-03-07 (×3): 40 mg via ORAL
  Filled 2013-03-05 (×4): qty 1

## 2013-03-05 MED ORDER — POTASSIUM CHLORIDE 10 MEQ/100ML IV SOLN
10.0000 meq | INTRAVENOUS | Status: AC
  Administered 2013-03-05 (×3): 10 meq via INTRAVENOUS
  Filled 2013-03-05 (×3): qty 100

## 2013-03-05 MED ORDER — LATANOPROST 0.005 % OP SOLN
1.0000 [drp] | Freq: Every day | OPHTHALMIC | Status: DC
  Administered 2013-03-05 – 2013-03-06 (×2): 1 [drp] via OPHTHALMIC
  Filled 2013-03-05 (×2): qty 2.5

## 2013-03-05 MED ORDER — BUPROPION HCL ER (XL) 300 MG PO TB24
300.0000 mg | ORAL_TABLET | Freq: Every day | ORAL | Status: DC
  Administered 2013-03-05 – 2013-03-07 (×3): 300 mg via ORAL
  Filled 2013-03-05 (×3): qty 1

## 2013-03-05 MED ORDER — FOLIC ACID 1 MG PO TABS
1.0000 mg | ORAL_TABLET | Freq: Every day | ORAL | Status: DC
  Administered 2013-03-05 – 2013-03-07 (×3): 1 mg via ORAL
  Filled 2013-03-05 (×3): qty 1

## 2013-03-05 MED ORDER — SPIRONOLACTONE 25 MG PO TABS
25.0000 mg | ORAL_TABLET | Freq: Every day | ORAL | Status: DC
  Administered 2013-03-05: 25 mg via ORAL
  Filled 2013-03-05 (×2): qty 1

## 2013-03-05 MED ORDER — NIFEDIPINE ER 60 MG PO TB24
120.0000 mg | ORAL_TABLET | Freq: Every day | ORAL | Status: DC
  Administered 2013-03-05 – 2013-03-07 (×3): 120 mg via ORAL
  Filled 2013-03-05 (×3): qty 2

## 2013-03-05 MED ORDER — MAGNESIUM OXIDE 400 (241.3 MG) MG PO TABS
400.0000 mg | ORAL_TABLET | Freq: Two times a day (BID) | ORAL | Status: DC
  Administered 2013-03-05 – 2013-03-06 (×3): 400 mg via ORAL
  Filled 2013-03-05 (×5): qty 1

## 2013-03-05 MED ORDER — AMLODIPINE BESYLATE 5 MG PO TABS
5.0000 mg | ORAL_TABLET | Freq: Every day | ORAL | Status: DC
  Administered 2013-03-05 – 2013-03-07 (×3): 5 mg via ORAL
  Filled 2013-03-05 (×3): qty 1

## 2013-03-05 MED ORDER — METHOTREXATE 2.5 MG PO TABS
10.0000 mg | ORAL_TABLET | ORAL | Status: DC
  Administered 2013-03-05: 10 mg via ORAL
  Filled 2013-03-05: qty 4

## 2013-03-05 MED ORDER — ARIPIPRAZOLE 10 MG PO TABS
30.0000 mg | ORAL_TABLET | Freq: Every day | ORAL | Status: DC
  Administered 2013-03-05 – 2013-03-07 (×3): 30 mg via ORAL
  Filled 2013-03-05 (×3): qty 3

## 2013-03-05 MED ORDER — POTASSIUM CHLORIDE CRYS ER 20 MEQ PO TBCR
40.0000 meq | EXTENDED_RELEASE_TABLET | Freq: Once | ORAL | Status: AC
  Administered 2013-03-05: 40 meq via ORAL
  Filled 2013-03-05: qty 2

## 2013-03-05 MED ORDER — ENOXAPARIN SODIUM 40 MG/0.4ML ~~LOC~~ SOLN
40.0000 mg | SUBCUTANEOUS | Status: DC
  Administered 2013-03-05 – 2013-03-06 (×2): 40 mg via SUBCUTANEOUS
  Filled 2013-03-05 (×3): qty 0.4

## 2013-03-05 NOTE — ED Notes (Signed)
Pt ambulatory to restroom unassisted with steady gait

## 2013-03-05 NOTE — ED Provider Notes (Signed)
Medical screening examination/treatment/procedure(s) were conducted as a shared visit with non-physician practitioner(s) and myself.  I personally evaluated the patient during the encounter.  EKG Interpretation     Ventricular Rate:  70 PR Interval:  182 QRS Duration: 110 QT Interval:  433 QTC Calculation: 467 R Axis:   9 Text Interpretation:  Sinus rhythm Borderline repolarization abnormality T wave inversions infero laterally with st depressions changed from prior            Pt with K+ of only 2.4, paresthesias in leg at home, abn ECG likely related to severe hypokalemia.  Will admit to obs status, spoke to Dr. Evlyn Kanner who will see pt in the hospital later today.  Will give 4 runs of 10 meq KCL by IV/  Pt understands plan.  40 meq given PO already.  Pt will be on telemetry during treatment.  BP meds can be adjusted and refilled prior to d/c home.     CRITICAL CARE Performed by: Lear Ng Total critical care time: 30 min Critical care time was exclusive of separately billable procedures and treating other patients. Critical care was necessary to treat or prevent imminent or life-threatening deterioration. Critical care was time spent personally by me on the following activities: development of treatment plan with patient and/or surrogate as well as nursing, discussions with consultants, evaluation of patient's response to treatment, examination of patient, obtaining history from patient or surrogate, ordering and performing treatments and interventions, ordering and review of laboratory studies, ordering and review of radiographic studies, pulse oximetry and re-evaluation of patient's condition.    Impression: Severe hypokalemia Abn ECG    Gavin Pound. Oletta Lamas, MD 03/05/13 [phone]

## 2013-03-05 NOTE — ED Notes (Signed)
PA-C at bedside 

## 2013-03-05 NOTE — Progress Notes (Signed)
Pt admitted to [facility] from [facility]. Pt is alert and oriented to staff, call bell, and room. Bed in lowest position. Call bell within reach. Full assessment to Epic. Will continue to monitor. Zimir Kittleson, RN   

## 2013-03-05 NOTE — Telephone Encounter (Signed)
C/D 03/05/13 for appt. 03/07/13 °

## 2013-03-05 NOTE — Progress Notes (Signed)
Pt has bipap at home of 12/8. Our machine would not give her pressure she was comfortable with so we placed machine on cpap of 14cmh2o.

## 2013-03-05 NOTE — ED Provider Notes (Signed)
CSN: [id number]     Arrival date & time 03/05/13  0548 History   First MD Initiated Contact with Patient 03/05/13 0555     Chief Complaint  Patient presents with  . Hypertension   (Consider location/radiation/quality/duration/timing/severity/associated sxs/prior Treatment) HPI Comments: The patient is a 52 year-old female with a past medical history of HTN, Thrombophilia, and DVT, presenting the Emergency Department with a chief complaint of high blood pressure and lower extremity hyperesthesia without pain. She reports she has been non-compliant with her Inspra 50 mg BID for 1 week. She reports "feeling like my blood pressure is up", reports home BP was 170/90.  She reports associated lightheadedness.  She also complains of Left Lower extremity paresthesia. And upper extremity paresthesia and pain which she reports started 4 days ago.  Denies chest pain, palpitations, headache, syncope or near syncope, confusion, or ataxia.     The history is provided by the patient. No language interpreter was used.    Past Medical History  Diagnosis Date  . Hypertension   . Glaucoma   . Gastritis   . Schizo-affective psychosis   . Depression   . DVT (deep venous thrombosis)     on Xaralto   Past Surgical History  Procedure Laterality Date  . Cervical conization w/bx    . Bunionectomy     No family history on file. History  Substance Use Topics  . Smoking status: Current Every Day Smoker  . Smokeless tobacco: Not on file  . Alcohol Use: Yes   OB History   Grav Para Term Preterm Abortions TAB SAB Ect Mult Living                 Review of Systems  All other systems reviewed and are negative.    Allergies  Ace inhibitors and Travatan  Home Medications   Current Outpatient Rx  Name  Route  Sig  Dispense  Refill  . amLODipine (NORVASC) 5 MG tablet   Oral   Take 5 mg by mouth daily.         . ARIPiprazole (ABILIFY) 30 MG tablet   Oral   Take 30 mg by mouth daily.         . celecoxib (CELEBREX) 200 MG capsule   Oral   Take 200 mg by mouth daily.         [name] desvenlafaxine (PRISTIQ) 50 MG 24 hr tablet   Oral   Take 50 mg by mouth daily.         [name] eplerenone (INSPRA) 50 MG tablet   Oral   Take 50 mg by mouth 2 (two) times daily.         [name] esomeprazole (NEXIUM) 40 MG capsule   Oral   Take 40 mg by mouth daily before breakfast.         . folic acid (FOLVITE) 1 MG tablet   Oral   Take 1 mg by mouth daily.         [name] latanoprost (XALATAN) 0.005 % ophthalmic solution   Both Eyes   Place 1 drop into both eyes at bedtime.         . methotrexate (RHEUMATREX) 2.5 MG tablet   Oral   Take 10 mg by mouth every 7 (seven) days. Mondays         . NIFEdipine (PROCARDIA XL) 60 MG 24 hr tablet   Oral   Take 120 mg by mouth daily.         [name]  potassium chloride (K-DUR) 10 MEQ tablet   Oral   Take 1 tablet (10 mEq total) by mouth 1 day or 1 dose.   4 tablet   0   . QUEtiapine (SEROQUEL) 300 MG tablet   Oral   Take 600 mg by mouth at bedtime.         . Rivaroxaban (XARELTO) 20 MG TABS tablet   Oral   Take 20 mg by mouth daily.         . verapamil (CALAN-SR) 240 MG CR tablet   Oral   Take 240 mg by mouth daily.           There were no vitals taken for this visit. Physical Exam  Nursing note and vitals reviewed. Constitutional: She is oriented to person, place, and time. She appears well-developed and well-nourished.  HENT:  Head: Normocephalic and atraumatic.  Eyes: EOM are normal. Pupils are equal, round, and reactive to light. Left eye exhibits no discharge.  Neck: Neck supple. No JVD present.  Cardiovascular: Normal rate and regular rhythm.   Murmur heard. Abdominal: Soft. Bowel sounds are normal. There is no tenderness. There is no rebound and no guarding.  Musculoskeletal: Normal range of motion. She exhibits no edema.  Neurological: She is alert and oriented to person, place, and time. She is not disoriented. She  displays no tremor. No cranial nerve deficit or sensory deficit. She exhibits normal muscle tone. Coordination normal. GCS eye subscore is 4. GCS verbal subscore is 5. GCS motor subscore is 6.  Reflex Scores:      Patellar reflexes are 1+ on the right side and 1+ on the left side.      Achilles reflexes are 1+ on the right side and 1+ on the left side. Skin: Skin is warm and dry.    ED Course  Procedures (including critical care time) Labs Review Labs Reviewed - No data to display Imaging Review No results found.  EKG Interpretation     Ventricular Rate:  70 PR Interval:  182 QRS Duration: 110 QT Interval:  433 QTC Calculation: 467 R Axis:   9 Text Interpretation:  Sinus rhythm Borderline repolarization abnormality T wave inversions infero laterally with st depressions changed from prior            MDM   1. Hypokalemia   2. Abnormal EKG    Patient with HTN and has been non compliant for 1 week.  She also complains of lower extremity hyperesthesias.  EKG, cardiac monitoring, labs ordered.   Discussed patient condition with Dr. Oletta Lamas who agrees on the current work up plan of the patient.  EKG changes noted since 12/05/2012-Lateral ST depressions, T-wave inversions K-2.4 will replete orally and IV at this time.  Discussed lab results with Dr. Oletta Lamas who will call for admission of the patient to correct her hypokalemia. Discussed these results with the patient and need to be admitted to correct these she reports understanding and her only concern is increase in stress due to going tot he heme-onc team for her Thrombophilia.       Clabe Seal, PA-C 03/08/13 1354

## 2013-03-05 NOTE — ED Notes (Signed)
Report given to McKenzie, RN

## 2013-03-05 NOTE — H&P (Signed)
PCP:   [name], MD   Chief Complaint:  Hands and feet tingling  HPI: This is a 52 year old black female with a history of hypertension under treatment. She had potassium added to her regimen back in August. She hasn't felt well for the last 3-4 days. She had general malaise and wasn't sure what was going on. She awakened at 4 AM this morning with both hands very numb in the left foot tingling and feeling odd. She was able to walk and had no motor deficits. She had no headache or other numbness or tingling in other body parts. She had a workup for neuropathy in her hands or 4 years ago with no specific diagnosis made. She did take B12 supplements many years ago. She's unaware of any thyroid history. She was seen and evaluated in the emergency room and had lab testing done. She was found to have very significantly low potassium at 2.4 mg/dL. She's been started on replacement. Her EKG however showed some ST changes in between this and the significance of the low potassium she is brought in on telemetry for an observation stay. She denies chest pain or shortness of breath. Her bowels are working well. She has some chronic arthritic complaints in her knees and back. Her weight has been stable. She's had no visual changes. She had no swallowing difficulty. She had been out of her medications for a few days prior to this presentation. At the moment she is feeling quite a bit better. Her foot feels entirely normal now. She has minimal neuropathy in her hands at the moment.  Review of Systems:  Review of Systems - negative except as above Past Medical History: Past Medical History  Diagnosis Date  . Hypertension   . Glaucoma   . Gastritis   . Schizo-affective psychosis   . Depression   . DVT (deep venous thrombosis)     on Xaralto  Past History Past Medical History (reviewed - no changes required): DVT , diagnosed June 19 2012, first DVT, unprovoked -- s/p 6 mo rx w/ xarelto  schizoaffective  disorder, major depression, OCD osteoarthritis, connective tissue disease, L sciatica , hydradenitis supporativa  B/L scleritis , CTD  HTN gastritis  glaucoma  Colonoscopy - [facility]   (Dr Hawkes*rheum)(Dr Cashwell*optho)(Dr Misty Stanley Flores*psych) prior PCP - Dr Posey Rea  Past Surgical History  Procedure Laterality Date  . Cervical conization w/bx    . Bunionectomy      Medications: Prior to Admission medications   Medication Sig Start Date End Date Taking? Authorizing Provider  amLODipine (NORVASC) 5 MG tablet Take 5 mg by mouth daily.   Yes Historical Provider, MD  ARIPiprazole (ABILIFY) 30 MG tablet Take 30 mg by mouth daily.   Yes Historical Provider, MD  buPROPion (WELLBUTRIN XL) 300 MG 24 hr tablet Take 300 mg by mouth daily.   Yes Historical Provider, MD  celecoxib (CELEBREX) 200 MG capsule Take 200 mg by mouth daily.   Yes Historical Provider, MD  desvenlafaxine (PRISTIQ) 50 MG 24 hr tablet Take 50 mg by mouth daily.   Yes Historical Provider, MD  esomeprazole (NEXIUM) 40 MG capsule Take 40 mg by mouth daily before breakfast.   Yes Historical Provider, MD  folic acid (FOLVITE) 1 MG tablet Take 1 mg by mouth daily.   Yes Historical Provider, MD  latanoprost (XALATAN) 0.005 % ophthalmic solution Place 1 drop into both eyes at bedtime.   Yes Historical Provider, MD  methotrexate (RHEUMATREX) 2.5 MG tablet Take  10 mg by mouth every 7 (seven) days. Mondays   Yes Historical Provider, MD  NIFEdipine (PROCARDIA XL) 60 MG 24 hr tablet Take 120 mg by mouth daily.   Yes Historical Provider, MD  potassium chloride (K-DUR) 10 MEQ tablet Take 1 tablet (10 mEq total) by mouth 1 day or 1 dose. 12/05/12  Yes Arman Filter, NP  QUEtiapine (SEROQUEL) 300 MG tablet Take 600 mg by mouth at bedtime.   Yes Historical Provider, MD  verapamil (CALAN-SR) 240 MG CR tablet Take 240 mg by mouth daily.    Yes Historical Provider, MD  eplerenone (INSPRA) 50 MG tablet Take 50 mg by mouth 2 (two) times  daily.    Historical Provider, MD    Allergies:   Allergies  Allergen Reactions  . Ace Inhibitors Anaphylaxis  . Travatan [Travoprost] Other (See Comments)    Red eyes, irritation     Social History:  reports that she has been smoking.  She does not have any smokeless tobacco history on file. She reports that she drinks alcohol. She reports that she does not use illicit drugs. Social History (reviewed - no changes required): lives at home w/ mother. 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 illness  EtOH - wine w/ dinner couple times per week drugs - none  tob - 1/2 PPD since 77   Family History: Family History (reviewed - no changes required): dad - d - failure of artificial aorta (placed 2/2 rheumatic fever) , HTN mom - DM, HTN, glaucoma, HLD, CVA > 60  sisters - HTN, HLD, mini-CVA, prediabetic  maternal aunt - breast cancer  mgm - MI at 89  mgf - MI at 50  Physical Exam: Filed Vitals:   03/05/13 0645 03/05/13 0715 03/05/13 0730 03/05/13 1036  BP: 156/72 155/73 150/79 173/79  Pulse: 78 72 63 65  Temp:    98.6 F (37 C)  TempSrc:    Oral  Resp: 12 14 16 20   Height:    5\' 11"  (1.803 m)  Weight:    114.3 kg (251 lb 15.8 oz)  SpO2: 96% 97% 97% 98%   General appearance: Black female sitting up in no distress  Mild prominence of her eyes is noted. There is no lid lag. Sclerae are anicteric. Extraocular movements are intact. Nasolabial folds are equal. Tongue protrudes midline. Neck: no adenopathy, no carotid bruit, no JVD and thyroid not enlarged, symmetric, no tenderness/mass/nodules Resp: Clear with no wheezes rales or rhonchi. No accessory muscles are in use. Cardio: Regular with systolic murmur. GI: Obese soft, non-tender; bowel sounds normal; no masses,  no organomegaly Extremities: Strong pulses with no edema. Neurologic: Alert and oriented X 3, with clear fluent speech . Grip is equal bilaterally.. Normal symmetric reflexes. No evidence of hallucination  or delusions are present. Skin exam reveals some acanthosis around the neck. NovoLog oh is noted.    Labs on Admission:   Recent Labs  03/05/13 0651  NA 141  K 2.4*  CL 101  CO2 29  GLUCOSE 111*  BUN 11  CREATININE 0.58  CALCIUM 8.7    Recent Labs  03/05/13 0651  AST 24  ALT 23  ALKPHOS 123*  BILITOT 0.4  PROT 7.6  ALBUMIN 3.7     Recent Labs  03/05/13 0651  WBC 5.2  HGB 15.6*  HCT 42.7  MCV 93.8  PLT 179        Radiological Exams on Admission: No results found. Orders placed during the hospital  encounter of 03/05/13  . ED EKG: Sinus rhythm Borderline repolarization abnormality T wave inversions infero laterally with st depressions changed from prior  .   .   .     Assessment/Plan Principal Problem:   Hypokalemia: With level this low and with EKG changes, needs observation stay to bring it up to safe levels. Add Mag Oxide as well. This appears to be easily provokable hypokalemia. In the setting of no significant diuretic use, we need to consider an aldosterone excess syndrome. She will need to be off the spironolactone to allow this to be done. She also will need to have her potassium repleted to get an accurate assessment. A plasma reading and activity and aldosterone at that time can be helpful. At the present we will replete her levels. Active Problems:   Nonspecific abnormal electrocardiogram (ECG) (EKG): will cycle enzymes but suspect this is related to K issues   Paresthesia: suspect K related but does have a history of other problems. Will check a B12 and thyroid level. Glucose is noted to be 111 postprandially.   Accelerated hypertension: due to missed Rx, coming down already.   Schizoaffective disorder: on RX, seems quite stable.   Obsessive compulsive disorder: on Rx   Depression: on several Rx   Obstructive sleep apnea   Glaucoma: continue Rx   Connective tissue disorder: on MTX   Suresh Audi ALAN 03/05/2013, 10:41 AM

## 2013-03-05 NOTE — ED Notes (Signed)
Pt c/o hypertension,numb hands and numb left leg. Pt states she ran out of her Inspra BP medication. At home her blood pressure was 170/90. Pt denies chest pain. Pt is A&Ox4, respirations equal and unlabored, skin warm and dry.

## 2013-03-06 ENCOUNTER — Observation Stay (HOSPITAL_COMMUNITY): Payer: Medicare Other

## 2013-03-06 LAB — BASIC METABOLIC PANEL
BUN: 9 mg/dL (ref 6–23)
Calcium: 9.1 mg/dL (ref 8.4–10.5)
Chloride: 101 mEq/L (ref 96–112)
GFR calc non Af Amer: 73 mL/min — ABNORMAL LOW (ref >90)
Sodium: 138 mEq/L (ref 135–145)

## 2013-03-06 LAB — MAGNESIUM: Magnesium: 2 mg/dL (ref 1.5–2.5)

## 2013-03-06 LAB — CBC
HCT: 40.8 % (ref 36.0–46.0)
Hemoglobin: 15.1 g/dL — ABNORMAL HIGH (ref 12.0–15.0)
MCHC: 37 g/dL — ABNORMAL HIGH (ref 30.0–36.0)
RBC: 4.36 MIL/uL (ref 3.87–5.11)

## 2013-03-06 LAB — NA AND K (SODIUM & POTASSIUM), RAND UR
Potassium Urine: 30 mEq/L
Sodium, Ur: 61 mEq/L

## 2013-03-06 LAB — COMPREHENSIVE METABOLIC PANEL
ALT: 22 U/L (ref 0–35)
Albumin: 3.5 g/dL (ref 3.5–5.2)
Alkaline Phosphatase: 113 U/L (ref 39–117)
CO2: 26 mEq/L (ref 19–32)
Calcium: 8.9 mg/dL (ref 8.4–10.5)
Creatinine, Ser: 0.58 mg/dL (ref 0.50–1.10)
GFR calc Af Amer: >90 mL/min (ref >90)
GFR calc non Af Amer: >90 mL/min (ref >90)
Glucose, Bld: 111 mg/dL — ABNORMAL HIGH (ref 70–99)
Sodium: 139 mEq/L (ref 135–145)
Total Protein: 7.6 g/dL (ref 6.0–8.3)

## 2013-03-06 LAB — URINALYSIS, ROUTINE W REFLEX MICROSCOPIC
Ketones, ur: NEGATIVE mg/dL
Leukocytes, UA: NEGATIVE
Nitrite: NEGATIVE
Protein, ur: NEGATIVE mg/dL
Specific Gravity, Urine: 1.031 — ABNORMAL HIGH (ref 1.005–1.030)
Urobilinogen, UA: 1 mg/dL (ref 0.0–1.0)

## 2013-03-06 LAB — CREATININE, URINE, RANDOM: Creatinine, Urine: 113.98 mg/dL

## 2013-03-06 MED ORDER — SPIRONOLACTONE 50 MG PO TABS
50.0000 mg | ORAL_TABLET | Freq: Every day | ORAL | Status: DC
  Administered 2013-03-06: 50 mg via ORAL
  Filled 2013-03-06: qty 1

## 2013-03-06 MED ORDER — POTASSIUM CHLORIDE 10 MEQ/100ML IV SOLN
10.0000 meq | INTRAVENOUS | Status: AC
  Administered 2013-03-06 (×4): 10 meq via INTRAVENOUS
  Filled 2013-03-06 (×4): qty 100

## 2013-03-06 MED ORDER — POTASSIUM CHLORIDE CRYS ER 20 MEQ PO TBCR
40.0000 meq | EXTENDED_RELEASE_TABLET | Freq: Once | ORAL | Status: AC
  Administered 2013-03-06: 40 meq via ORAL

## 2013-03-06 MED ORDER — AMILORIDE HCL 5 MG PO TABS
5.0000 mg | ORAL_TABLET | Freq: Every day | ORAL | Status: DC
  Administered 2013-03-06: 5 mg via ORAL
  Filled 2013-03-06 (×2): qty 1

## 2013-03-06 MED ORDER — POTASSIUM CHLORIDE CRYS ER 20 MEQ PO TBCR
20.0000 meq | EXTENDED_RELEASE_TABLET | Freq: Two times a day (BID) | ORAL | Status: DC
  Administered 2013-03-06: 20 meq via ORAL
  Filled 2013-03-06 (×3): qty 1

## 2013-03-06 MED ORDER — IOHEXOL 300 MG/ML  SOLN
100.0000 mL | Freq: Once | INTRAMUSCULAR | Status: AC | PRN
  Administered 2013-03-06: 100 mL via INTRAVENOUS

## 2013-03-06 NOTE — Progress Notes (Addendum)
Physician Daily Progress Note  Subjective: Her parasthesias are better but still some numbness in fingers K remains at 2.8 w/ level pending this AM On Tele On DVT PPx No other complaints   Objective: Vital signs in last 24 hours: Temp:  [97.4 F (36.3 C)-99 F (37.2 C)] 97.4 F (36.3 C) (11/18 0532) Pulse Rate:  [63-78] 78 (11/18 0532) Resp:  [18-21] 21 (11/18 0532) BP: (145-173)/(66-79) 149/76 mmHg (11/18 0532) SpO2:  [90 %-98 %] 94 % (11/18 0532) Weight:  [251 lb 15.8 oz (114.3 kg)-252 lb 6.4 oz (114.488 kg)] 252 lb 6.4 oz (114.488 kg) (11/17 2058) Weight change:  Last BM Date: 03/05/13  CBG (last 3)  No results found for this basename: GLUCAP,  in the last 72 hours  Intake/Output from previous day:  Intake/Output Summary (Last 24 hours) at 03/06/13 0750 Last data filed at 03/06/13 0600  Gross per 24 hour  Intake   1700 ml  Output      0 ml  Net   1700 ml   11/17 0701 - 11/18 0700 In: 1700 [P.O.:940; IV Piggyback:600] Out: -   Physical Exam General appearance: AAF in NAD  Eyes: no scleral icterus Throat: oropharynx moist without erythema Resp: CTAB, no wheezes or rales  Cardio: RRR, no MRG  GI: soft, non-tender; bowel sounds normal; no masses,  no organomegaly Extremities: no clubbing, cyanosis or edema   Lab Results:  Recent Labs  03/05/13 1500 03/05/13 2050  NA 140 139  K 2.9* 2.8*  CL 102 101  CO2 29 28  GLUCOSE 90 118*  BUN 10 10  CREATININE 0.62 0.59  CALCIUM 8.7 9.0     Recent Labs  03/05/13 0651  AST 24  ALT 23  ALKPHOS 123*  BILITOT 0.4  PROT 7.6  ALBUMIN 3.7     Recent Labs  03/05/13 0651  WBC 5.2  HGB 15.6*  HCT 42.7  MCV 93.8  PLT 179    Lab Results  Component Value Date   INR 1.11 12/05/2012     Recent Labs  03/05/13 1209 03/05/13 1607 03/05/13 2240  TROPONINI <0.30 <0.30 <0.30     Recent Labs  03/05/13 1500  TSH 1.639     Recent Labs  03/05/13 1500  VITAMINB12 1352*    Micro Results: No  results found for this or any previous visit (from the past 240 hour(s)).  Studies/Results: No results found.   Medications: Scheduled: . amLODipine  5 mg Oral Daily  . ARIPiprazole  30 mg Oral Daily  . buPROPion  300 mg Oral Daily  . celecoxib  200 mg Oral Daily  . enoxaparin (LOVENOX) injection  40 mg Subcutaneous Q24H  . folic acid  1 mg Oral Daily  . latanoprost  1 drop Both Eyes QHS  . magnesium oxide  400 mg Oral BID  . methotrexate  10 mg Oral Q7 days  . NIFEdipine  120 mg Oral Daily  . pantoprazole  40 mg Oral Daily  . QUEtiapine  600 mg Oral QHS  . spironolactone  50 mg Oral Daily  . venlafaxine XR  75 mg Oral Q breakfast  . verapamil  240 mg Oral Daily   Continuous:   Assessment/Plan:  Hypokalemia - concern that she may have a hyperaldo state contributing to her condition (she has HTN, hypokalemia,? alkalosis). We have resumed her aldactone today. She is s/p of KCl w/ K running 2.4>2.9>2.8>pending this AM. I am checking Mag, urine labs, VBG to help delineate  cause.I discussed case w/ nephrology and am adding 5mg  amiloride daily and ordering a CT A/P as well to look for adrenal adenoma, hyperplasia, etc    hypertension: moderate control on home meds  Schizoaffective disorder: on RX, seems quite stable.  Obsessive compulsive disorder: on Rx  Depression: on several Rx  Obstructive sleep apnea  Glaucoma: continue Rx  Connective tissue disorder: on MTX  DVT Prophylaxis - lovenox and PPI   Dispo - home when potassium stabilizes    LOS: 1 day   Theotis Gerdeman 03/06/2013, 7:50 AM

## 2013-03-06 NOTE — Progress Notes (Signed)
Pt is placing self on/off cpap. Rt will continue to monitor.

## 2013-03-06 NOTE — Progress Notes (Signed)
UR completed.  Patient changed to inpatient status r/t EKG changes on admission and requiring potassium IV

## 2013-03-07 ENCOUNTER — Ambulatory Visit: Payer: Medicare Other

## 2013-03-07 ENCOUNTER — Ambulatory Visit: Payer: Medicare Other | Admitting: Hematology and Oncology

## 2013-03-07 LAB — MAGNESIUM: Magnesium: 2.2 mg/dL (ref 1.5–2.5)

## 2013-03-07 LAB — CBC
HCT: 45.2 % (ref 36.0–46.0)
Hemoglobin: 16.7 g/dL — ABNORMAL HIGH (ref 12.0–15.0)
MCH: 35.1 pg — ABNORMAL HIGH (ref 26.0–34.0)
MCHC: 36.9 g/dL — ABNORMAL HIGH (ref 30.0–36.0)
MCV: 95 fL (ref 78.0–100.0)
RDW: 13.6 % (ref 11.5–15.5)
WBC: 4.4 10*3/uL (ref 4.0–10.5)

## 2013-03-07 LAB — COMPREHENSIVE METABOLIC PANEL
Albumin: 4.1 g/dL (ref 3.5–5.2)
BUN: 11 mg/dL (ref 6–23)
Chloride: 101 mEq/L (ref 96–112)
Creatinine, Ser: 0.72 mg/dL (ref 0.50–1.10)
GFR calc non Af Amer: >90 mL/min (ref >90)
Total Bilirubin: 0.6 mg/dL (ref 0.3–1.2)
Total Protein: 8.4 g/dL — ABNORMAL HIGH (ref 6.0–8.3)

## 2013-03-07 LAB — BASIC METABOLIC PANEL
BUN: 12 mg/dL (ref 6–23)
Calcium: 9.5 mg/dL (ref 8.4–10.5)
GFR calc non Af Amer: >90 mL/min (ref >90)
Glucose, Bld: 120 mg/dL — ABNORMAL HIGH (ref 70–99)
Sodium: 138 mEq/L (ref 135–145)

## 2013-03-07 MED ORDER — POTASSIUM CHLORIDE CRYS ER 20 MEQ PO TBCR
40.0000 meq | EXTENDED_RELEASE_TABLET | Freq: Once | ORAL | Status: AC
  Administered 2013-03-07: 40 meq via ORAL
  Filled 2013-03-07: qty 2

## 2013-03-07 MED ORDER — AMILORIDE HCL 5 MG PO TABS
10.0000 mg | ORAL_TABLET | Freq: Two times a day (BID) | ORAL | Status: DC
  Administered 2013-03-07 (×2): 10 mg via ORAL
  Filled 2013-03-07 (×3): qty 2

## 2013-03-07 MED ORDER — POTASSIUM CHLORIDE CRYS ER 20 MEQ PO TBCR
40.0000 meq | EXTENDED_RELEASE_TABLET | Freq: Once | ORAL | Status: AC
  Administered 2013-03-07: 40 meq via ORAL

## 2013-03-07 MED ORDER — POTASSIUM CHLORIDE CRYS ER 20 MEQ PO TBCR: EXTENDED_RELEASE_TABLET | ORAL | Status: DC

## 2013-03-07 MED ORDER — POTASSIUM CHLORIDE CRYS ER 20 MEQ PO TBCR: 40.0000 meq | EXTENDED_RELEASE_TABLET | Freq: Two times a day (BID) | ORAL | Status: DC

## 2013-03-07 MED ORDER — AMILORIDE HCL 5 MG PO TABS: ORAL_TABLET | ORAL | Status: DC

## 2013-03-07 NOTE — Progress Notes (Signed)
Patient discharged.  Patient given her 1600 dose of potassium ( PO) and Amiloride.  Prescriptions sent electronically to patient pharmacy.  IV removed without complication.  Telemetry discontinued and CMT notified.  Patient educated on discharge instructions, new medications, follow-up appointments, and signs and symptoms of hypokalemia.  Patient given information on high potassium foods.  Patient verbalized understanding of all education materials and discharge instructions.  Belongings gathered.  Patient escorted to car with NT Chermaine.

## 2013-03-07 NOTE — Discharge Summary (Signed)
Physician Discharge Summary    Jeanne [name]  MR#: [id number]  DOB:[birth year]  Date of Admission: 03/05/2013 Date of Discharge: 03/07/2013  Attending Physician:Tammala Weider  Patient's WUJ:[id number], Lorin Picket, MD  Consults: none   Discharge Diagnoses: Principal Problem:   Hypokalemia Active Problems:   Nonspecific abnormal electrocardiogram (ECG) (EKG)   Paresthesia   Accelerated hypertension   Schizoaffective disorder   Obsessive compulsive disorder   Depression   Obstructive sleep apnea   Glaucoma   Connective tissue disorder   Discharge Medications:   Medication List    STOP taking these medications       eplerenone 50 MG tablet  Commonly known as:  INSPRA     potassium chloride 10 MEQ tablet  Commonly known as:  K-DUR      TAKE these medications       aMILoride 5 MG tablet  Commonly known as:  MIDAMOR  Take 2 tabs by mouth twice daily     amLODipine 5 MG tablet  Commonly known as:  NORVASC  Take 5 mg by mouth daily.     ARIPiprazole 30 MG tablet  Commonly known as:  ABILIFY  Take 30 mg by mouth daily.     buPROPion 300 MG 24 hr tablet  Commonly known as:  WELLBUTRIN XL  Take 300 mg by mouth daily.     celecoxib 200 MG capsule  Commonly known as:  CELEBREX  Take 200 mg by mouth daily.     desvenlafaxine 50 MG 24 hr tablet  Commonly known as:  PRISTIQ  Take 50 mg by mouth daily.     esomeprazole 40 MG capsule  Commonly known as:  NEXIUM  Take 40 mg by mouth daily before breakfast.     folic acid 1 MG tablet  Commonly known as:  FOLVITE  Take 1 mg by mouth daily.     latanoprost 0.005 % ophthalmic solution  Commonly known as:  XALATAN  Place 1 drop into both eyes at bedtime.     methotrexate 2.5 MG tablet  Commonly known as:  RHEUMATREX  Take 10 mg by mouth every 7 (seven) days. Mondays     potassium chloride SA 20 MEQ tablet  Commonly known as:  K-DUR,KLOR-CON  Take 2 tablets by mouth twice dialy     PROCARDIA XL 60 MG 24 hr  tablet  Generic drug:  NIFEdipine  Take 120 mg by mouth daily.     QUEtiapine 300 MG tablet  Commonly known as:  SEROQUEL  Take 600 mg by mouth at bedtime.     verapamil 240 MG CR tablet  Commonly known as:  CALAN-SR  Take 240 mg by mouth daily.        Hospital Procedures: Ct Abdomen Pelvis Wo Contrast  03/06/2013   CLINICAL DATA:  Hypertension, evaluate for adrenal hyperplasia versus adenoma  EXAM: CT ABDOMEN AND PELVIS WITHOUT CONTRAST  TECHNIQUE: Multidetector CT imaging of the abdomen and pelvis was performed following the standard protocol without intravenous contrast.  COMPARISON:  None.  FINDINGS: Lung bases are essentially clear.  Cardiomegaly.  10 mm cyst in the medial segment left hepatic lobe (series 8/ image 56).  Spleen, pancreas, and right adrenal glands are within normal limits.  2.1 x 1.6 cm left adrenal nodule (series 8/image 50). Washout characteristics are typical of an adrenal adenoma (nodule measures 10, 70, and 23 HUs on all three phases).  Kidneys are within normal limits. No renal calculi or hydronephrosis.  No evidence of bowel obstruction.  Normal appendix.  Mild atherosclerotic calcifications of the abdominal aorta and branch vessels. No evidence of abdominal aortic aneurysm.  No abdominopelvic ascites.  No suspicious abdominopelvic lymphadenopathy.  Uterus and bilateral ovaries are unremarkable.  Mildly thick-walled bladder.  Mild degenerative changes of the visualized thoracolumbar spine.  IMPRESSION: 2.1 x 1.6 cm left adrenal adenoma.  Additional ancillary findings as above.   Electronically Signed   By: Charline Bills M.D.   On: 03/06/2013 12:11    History of Present Illness: Admitted for severe hypokalemia w/ EKG changes   Hospital Course: Hypokalemia - Patient was admitted for K of 2.4 and EKG changes w/ extremity parasthesia. Trop was neg x 3 in w/u of EKG changes. She was also found to be mildly alkalotic w/ bicarb of 29. She does endorse missing her  dose of eplerenone 50mg  BID for the past week b/c she ran out. Upon admission, she initially remained resistant to both IV and PO KCl supplements, but after placing back on oral aldo antagonists, her K levels began to improve, up to 3.4 at best day prior to discharge. A CT A/P was ordered that showed a 2cm adrenal adenoma, raising suspicion for hyperfunctioning adenoma leading to a hyperaldo state causing her low potassium, HTN. The patient will follow up w/ endocrinology, Dr Ardyth Harps, after discharge, to continue the w/u of this adenoma. During her hospitalization, our primary goal was stabilizing her BP and potassium to a point safe for discharge. She will be placed on of KCl BID and amiloride 10mg  BID (max dose equivalent to the eplerenone that she was on prior to admission, which was changed 2/2 apparent lack of efficacy) to help stabilize her potassium. I will follow her up on Friday (48 hrs after discharge) to recheck her potassium levels on these medications. Rxs for both meds were sent to the pharmacy. Urine potassium levels and urine metaneph/catechol levels have been ordered. As well as PRA and serum aldo levels. These numbers will be followed up by myself and endocrine after discharge in further w/up of the cause of her hypokalemia .   Day of Discharge Exam BP 131/82  Pulse 59  Temp(Src) 98.4 F (36.9 C) (Oral)  Resp 18  Ht 5\' 11"  (1.803 m)  Wt 256 lb 6.4 oz (116.302 kg)  BMI 35.78 kg/m2  SpO2 95%  Physical Exam: General appearance: AAF in NAD  Eyes: no scleral icterus Throat: oropharynx moist without erythema Resp: CTAB, no wheezes or rales  Cardio: RRR, 2/6 SEM, no RG  GI: soft, non-tender; bowel sounds normal; no masses,  no organomegaly Extremities: no clubbing, cyanosis or edema  Discharge Labs:  Recent Labs  03/06/13 0843 03/06/13 1449 03/07/13 0615  NA  --  138 141  K  --  3.4* 3.1*  CL  --  101 101  CO2  --  27 27  GLUCOSE  --  141* 108*  BUN  --  9 11   CREATININE  --  0.89 0.72  CALCIUM  --  9.1 9.7  MG 2.0  --  2.2    Recent Labs  03/06/13 0629 03/07/13 0615  AST 25 22  ALT 22 26  ALKPHOS 113 132*  BILITOT 0.6 0.6  PROT 7.6 8.4*  ALBUMIN 3.5 4.1    Recent Labs  03/06/13 0843 03/07/13 0615  WBC 4.2 4.4  HGB 15.1* 16.7*  HCT 40.8 45.2  MCV 93.6 95.0  PLT 182 196   Lab Results  Component Value Date  INR 1.11 12/05/2012    Recent Labs  03/05/13 1209 03/05/13 1607 03/05/13 2240  TROPONINI <0.30 <0.30 <0.30    Recent Labs  03/05/13 1500  TSH 1.639    Recent Labs  03/05/13 1500  VITAMINB12 1352*    Discharge instructions:     Discharge Orders   Future Appointments Provider Department Dept Phone   03/22/2013 10:30 AM [facility] [phone]   03/22/2013 11:00 AM Artis Delay, MD [facility] [phone]   03/26/2013 2:40 PM [facility] [phone]   Patient should wear two piece clothing and wear no powder or deodorant. Patient should arrive 15 minutes early.   Future Orders Complete By Expires   Call MD for:  As directed    Comments:     Worsening numbness/tingling/weakness of extremities, heart racing or palpitations, or any changes in physical condition   Diet - low sodium heart healthy  As directed    Increase activity slowly  As directed      01-Home or Self Care   Disposition: home   Follow-up Appts: Follow-up with Dr. Link Snuffer at [facility] on friday.    You will also follow up w/ Dr Evlyn Kanner, endocrinology on the afternoon of 11/26. Call [phone] if need further appt details   Condition on Discharge: stable   Tests Needing Follow-up: BMet , PRA , serum aldo  Time spent in discharge (includes decision making & examination of pt): 45 minutes    Signed: Emer Onnen 03/07/2013, 8:14 AM

## 2013-03-10 LAB — CATECHOLAMINES, FRACTIONATED, URINE, 24 HOUR
Creatinine, Urine mg/day-CATEUR: 0.94 g/(24.h) (ref 0.63–2.50)
Dopamine 24 Hr Urine: 172 mcg/24 h (ref 52–480)
Epinephrine 24 Hr Urine: 7 mcg/24 h (ref 2–24)
Norepinephrine 24 Hr Urine: 39 mcg/24 h (ref 15–100)
Total urine volume: 3000 mL

## 2013-03-10 LAB — CATECHOLAMINES, FRACTIONATED, PLASMA
Epinephrine: 41 pg/mL
Norepinephrine: 294 pg/mL

## 2013-03-11 LAB — ALDOSTERONE + RENIN ACTIVITY W/ RATIO
Aldosterone: 61 ng/dL — ABNORMAL HIGH
PRA LC/MS/MS: 0.15 ng/mL/h — ABNORMAL LOW (ref 0.25–5.82)

## 2013-03-13 LAB — METANEPHRINES, URINE, 24 HOUR
Metaneph Total, Ur: 736 mcg/24 h (ref 224–832)
Normetanephrine, 24H Ur: 632 mcg/24 h (ref 122–676)
Volume, Urine-METAN: 3000 mL

## 2013-03-20 ENCOUNTER — Other Ambulatory Visit (HOSPITAL_COMMUNITY): Payer: Self-pay | Admitting: Interventional Radiology

## 2013-03-22 ENCOUNTER — Ambulatory Visit (HOSPITAL_BASED_OUTPATIENT_CLINIC_OR_DEPARTMENT_OTHER): Payer: Medicare Other | Admitting: Hematology and Oncology

## 2013-03-22 ENCOUNTER — Ambulatory Visit (HOSPITAL_BASED_OUTPATIENT_CLINIC_OR_DEPARTMENT_OTHER): Payer: Medicare Other

## 2013-03-22 ENCOUNTER — Encounter: Payer: Self-pay | Admitting: Hematology and Oncology

## 2013-03-22 VITALS — BP 151/72 | HR 82 | Temp 98.0°F | Resp 18 | Ht 71.0 in | Wt 263.2 lb

## 2013-03-22 DIAGNOSIS — F17200 Nicotine dependence, unspecified, uncomplicated: Secondary | ICD-10-CM

## 2013-03-22 DIAGNOSIS — I82402 Acute embolism and thrombosis of unspecified deep veins of left lower extremity: Secondary | ICD-10-CM

## 2013-03-22 DIAGNOSIS — I82409 Acute embolism and thrombosis of unspecified deep veins of unspecified lower extremity: Secondary | ICD-10-CM | POA: Insufficient documentation

## 2013-03-22 DIAGNOSIS — M329 Systemic lupus erythematosus, unspecified: Secondary | ICD-10-CM

## 2013-03-22 DIAGNOSIS — O2230 Deep phlebothrombosis in pregnancy, unspecified trimester: Secondary | ICD-10-CM | POA: Insufficient documentation

## 2013-03-22 DIAGNOSIS — M359 Systemic involvement of connective tissue, unspecified: Secondary | ICD-10-CM

## 2013-03-22 NOTE — Progress Notes (Signed)
[facility] CONSULT NOTE  Patient Care Team: [name], MD as PCP - General (Internal Medicine)  CHIEF COMPLAINTS/PURPOSE OF CONSULTATION:  History of left lower extremity DVT, abnormal blood work  HISTORY OF PRESENTING ILLNESS:  Jeanne [name] 52 y.o. female is here because of history of DVT. According to the patient, it started with phlebitis in the left leg. The patient had background history of chronic varicose veins. The patient is also an active smoker.  She denies lower extremity swelling, warmth, tenderness & erythema.  She denies recent chest pain on exertion, shortness of breath on minimal exertion, pre-syncopal episodes, hemoptysis, or palpitation. She denies recent  history of trauma,  long distance travel, dehydration, recent surgery, or prolonged immobilization. She had no prior history or diagnosis of cancer. Her age appropriate screening programs are up-to-date. She had prior surgeries before and never had perioperative thromboembolic events. Her mother had history of DVT after needle replacement surgery. 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 She denies any abnormal skin rash or joint pain. She had 20 pounds of intentional weight loss this year.  MEDICAL HISTORY:  Past Medical History  Diagnosis Date  . Hypertension   . Glaucoma   . Gastritis   . Schizo-affective psychosis   . Depression   . DVT (deep venous thrombosis)     on [place]  . Heart murmur   . Hypokalemia 03/05/2013  . Neuromuscular disorder     " NEUROPATHY IN  MY HANDS"  . Arthritis   . Sleep apnea   . Scleritis of both eyes     SURGICAL HISTORY: Past Surgical History  Procedure Laterality Date  . Cervical conization w/bx    . Bunionectomy    . Lymph node dissection      SOCIAL HISTORY: History   Social History  . Marital Status: Single    Spouse Name: N/A    Number of  Children: N/A  . Years of Education: N/A   Occupational History  . Not on file.   Social History Main Topics  . Smoking status: Current Every Day Smoker -- 0.50 packs/day for 25 years    Types: Cigarettes  . Smokeless tobacco: Never Used  . Alcohol Use: Yes     Comment: " Idrink just on weekends "  . Drug Use: No  . Sexual Activity: Not on file   Other Topics Concern  . Not on file   Social History Narrative  . No narrative on file    FAMILY HISTORY: Family History  Problem Relation Age of Onset  . Cancer Maternal Aunt     breast ca    ALLERGIES:  is allergic to ace inhibitors and travatan.  MEDICATIONS:  Current Outpatient Prescriptions  Medication Sig Dispense Refill  . aMILoride (MIDAMOR) 5 MG tablet Take 2 tabs by mouth twice daily  120 tablet  2  . amLODipine (NORVASC) 5 MG tablet Take 5 mg by mouth daily.      . ARIPiprazole (ABILIFY) 30 MG tablet Take 30 mg by mouth daily.      [name] buPROPion (WELLBUTRIN XL) 300 MG 24 hr tablet Take 300 mg by mouth daily.      . celecoxib (CELEBREX) 200 MG capsule Take 200 mg by mouth daily.      [name] desvenlafaxine (PRISTIQ) 50 MG 24 hr tablet Take 50 mg by mouth daily.      [name] esomeprazole (  NEXIUM) 40 MG capsule Take 40 mg by mouth daily before breakfast.      . folic acid (FOLVITE) 1 MG tablet Take 1 mg by mouth daily.      [name] latanoprost (XALATAN) 0.005 % ophthalmic solution Place 1 drop into both eyes at bedtime.      . methotrexate (RHEUMATREX) 2.5 MG tablet Take 10 mg by mouth every 7 (seven) days. Mondays      . NIFEdipine (PROCARDIA XL) 60 MG 24 hr tablet Take 120 mg by mouth daily.      . potassium chloride SA (K-DUR,KLOR-CON) 20 MEQ tablet Take 2 tablets by mouth twice dialy  120 tablet  2  . QUEtiapine (SEROQUEL) 300 MG tablet Take 600 mg by mouth at bedtime.      . verapamil (CALAN-SR) 240 MG CR tablet Take 240 mg by mouth daily.        No current facility-administered medications for this visit.    REVIEW OF SYSTEMS:    Constitutional: Denies fevers, chills or abnormal night sweats Eyes: Denies blurriness of vision, double vision or watery eyes Ears, nose, mouth, throat, and face: Denies mucositis or sore throat Respiratory: Denies cough, dyspnea or wheezes Cardiovascular: Denies palpitation, chest discomfort or lower extremity swelling Gastrointestinal:  Denies nausea, heartburn or change in bowel habits Skin: Denies abnormal skin rashes Lymphatics: Denies new lymphadenopathy or easy bruising Neurological:Denies numbness, tingling or new weaknesses Behavioral/Psych: Mood is stable, no new changes  All other systems were reviewed with the patient and are negative.  PHYSICAL EXAMINATION: ECOG PERFORMANCE STATUS: 0 - Asymptomatic  Filed Vitals:   03/22/13 1101  BP: 151/72  Pulse: 82  Temp: 98 F (36.7 C)  Resp: 18   Filed Weights   03/22/13 1101  Weight: 263 lb 3.2 oz (119.387 kg)    GENERAL:alert, no distress and comfortable. She looked older than stated age SKIN: skin color, texture, turgor are normal, no rashes or significant lesions EYES: normal, conjunctiva are pink and non-injected, sclera clear OROPHARYNX:no exudate, no erythema and lips, buccal mucosa, and tongue normal  NECK: supple, thyroid normal size, non-tender, without nodularity LYMPH:  no palpable lymphadenopathy in the cervical, axillary or inguinal LUNGS: clear to auscultation and percussion with normal breathing effort HEART: regular rate & rhythm and no murmurs and no lower extremity edema ABDOMEN:abdomen soft, non-tender and normal bowel sounds Musculoskeletal:no cyanosis of digits and no clubbing . Noted mild variscose veins on the left leg PSYCH: alert & oriented x 3 with fluent speech NEURO: no focal motor/sensory deficits  LABORATORY DATA:  I have reviewed the data as listed Recent Results (from the past 2160 hour(s))  CBC     Status: Abnormal   Collection Time    03/05/13  6:51 AM      Result Value Range    WBC 5.2  4.0 - 10.5 K/uL   RBC 4.55  3.87 - 5.11 MIL/uL   Hemoglobin 15.6 (*) 12.0 - 15.0 g/dL   HCT 16.1  09.6 - 04.5 %   MCV 93.8  78.0 - 100.0 fL   MCH 34.3 (*) 26.0 - 34.0 pg   MCHC 36.5 (*) 30.0 - 36.0 g/dL   RDW 40.9  81.1 - 91.4 %   Platelets 179  150 - 400 K/uL  COMPREHENSIVE METABOLIC PANEL     Status: Abnormal   Collection Time    03/05/13  6:51 AM      Result Value Range   Sodium 141  135 - 145 mEq/L  Potassium 2.4 (*) 3.5 - 5.1 mEq/L   Comment: CRITICAL RESULT CALLED TO, READ BACK BY AND VERIFIED WITH:     L.SHUALER,RN 03/05/13 0748 BY BSLADE   Chloride 101  96 - 112 mEq/L   CO2 29  19 - 32 mEq/L   Glucose, Bld 111 (*) 70 - 99 mg/dL   BUN 11  6 - 23 mg/dL   Creatinine, Ser 0.86  0.50 - 1.10 mg/dL   Calcium 8.7  8.4 - 57.8 mg/dL   Total Protein 7.6  6.0 - 8.3 g/dL   Albumin 3.7  3.5 - 5.2 g/dL   AST 24  0 - 37 U/L   ALT 23  0 - 35 U/L   Alkaline Phosphatase 123 (*) 39 - 117 U/L   Total Bilirubin 0.4  0.3 - 1.2 mg/dL   GFR calc non Af Amer >90  >90 mL/min   GFR calc Af Amer >90  >90 mL/min   Comment: (NOTE)     The eGFR has been calculated using the CKD EPI equation.     This calculation has not been validated in all clinical situations.     eGFR's persistently <90 mL/min signify possible Chronic Kidney     Disease.  TROPONIN I     Status: None   Collection Time    03/05/13 12:09 PM      Result Value Range   Troponin I <0.30  <0.30 ng/mL   Comment:            Due to the release kinetics of cTnI,     a negative result within the first hours     of the onset of symptoms does not rule out     myocardial infarction with certainty.     If myocardial infarction is still suspected,     repeat the test at appropriate intervals.  BASIC METABOLIC PANEL     Status: Abnormal   Collection Time    03/05/13  3:00 PM      Result Value Range   Sodium 140  135 - 145 mEq/L   Potassium 2.9 (*) 3.5 - 5.1 mEq/L   Comment: NO VISIBLE HEMOLYSIS   Chloride 102  96 - 112 mEq/L    CO2 29  19 - 32 mEq/L   Glucose, Bld 90  70 - 99 mg/dL   BUN 10  6 - 23 mg/dL   Creatinine, Ser 4.69  0.50 - 1.10 mg/dL   Calcium 8.7  8.4 - 62.9 mg/dL   GFR calc non Af Amer >90  >90 mL/min   GFR calc Af Amer >90  >90 mL/min   Comment: (NOTE)     The eGFR has been calculated using the CKD EPI equation.     This calculation has not been validated in all clinical situations.     eGFR's persistently <90 mL/min signify possible Chronic Kidney     Disease.  TSH     Status: None   Collection Time    03/05/13  3:00 PM      Result Value Range   TSH 1.639  0.350 - 4.500 uIU/mL   Comment: Performed at [facility]  VITAMIN B12     Status: Abnormal   Collection Time    03/05/13  3:00 PM      Result Value Range   Vitamin B-12 1352 (*) 211 - 911 pg/mL   Comment: Performed at [facility]  TROPONIN I     Status: None  Collection Time    03/05/13  4:07 PM      Result Value Range   Troponin I <0.30  <0.30 ng/mL   Comment:            Due to the release kinetics of cTnI,     a negative result within the first hours     of the onset of symptoms does not rule out     myocardial infarction with certainty.     If myocardial infarction is still suspected,     repeat the test at appropriate intervals.  BASIC METABOLIC PANEL     Status: Abnormal   Collection Time    03/05/13  8:50 PM      Result Value Range   Sodium 139  135 - 145 mEq/L   Potassium 2.8 (*) 3.5 - 5.1 mEq/L   Chloride 101  96 - 112 mEq/L   CO2 28  19 - 32 mEq/L   Glucose, Bld 118 (*) 70 - 99 mg/dL   BUN 10  6 - 23 mg/dL   Creatinine, Ser 1.61  0.50 - 1.10 mg/dL   Calcium 9.0  8.4 - 09.6 mg/dL   GFR calc non Af Amer >90  >90 mL/min   GFR calc Af Amer >90  >90 mL/min   Comment: (NOTE)     The eGFR has been calculated using the CKD EPI equation.     This calculation has not been validated in all clinical situations.     eGFR's persistently <90 mL/min signify possible Chronic Kidney     Disease.  TROPONIN  I     Status: None   Collection Time    03/05/13 10:40 PM      Result Value Range   Troponin I <0.30  <0.30 ng/mL   Comment:            Due to the release kinetics of cTnI,     a negative result within the first hours     of the onset of symptoms does not rule out     myocardial infarction with certainty.     If myocardial infarction is still suspected,     repeat the test at appropriate intervals.  COMPREHENSIVE METABOLIC PANEL     Status: Abnormal   Collection Time    03/06/13  6:29 AM      Result Value Range   Sodium 139  135 - 145 mEq/L   Potassium 2.9 (*) 3.5 - 5.1 mEq/L   Comment: HEMOLYSIS AT THIS LEVEL MAY AFFECT RESULT   Chloride 100  96 - 112 mEq/L   CO2 26  19 - 32 mEq/L   Glucose, Bld 111 (*) 70 - 99 mg/dL   BUN 8  6 - 23 mg/dL   Creatinine, Ser 0.45  0.50 - 1.10 mg/dL   Calcium 8.9  8.4 - 40.9 mg/dL   Total Protein 7.6  6.0 - 8.3 g/dL   Albumin 3.5  3.5 - 5.2 g/dL   AST 25  0 - 37 U/L   ALT 22  0 - 35 U/L   Alkaline Phosphatase 113  39 - 117 U/L   Total Bilirubin 0.6  0.3 - 1.2 mg/dL   GFR calc non Af Amer >90  >90 mL/min   GFR calc Af Amer >90  >90 mL/min   Comment: (NOTE)     The eGFR has been calculated using the CKD EPI equation.     This calculation has not been validated in  all clinical situations.     eGFR's persistently <90 mL/min signify possible Chronic Kidney     Disease.  CBC     Status: Abnormal   Collection Time    03/06/13  8:43 AM      Result Value Range   WBC 4.2  4.0 - 10.5 K/uL   Comment: WHITE COUNT CONFIRMED ON SMEAR   RBC 4.36  3.87 - 5.11 MIL/uL   Hemoglobin 15.1 (*) 12.0 - 15.0 g/dL   HCT 98.1  19.1 - 47.8 %   MCV 93.6  78.0 - 100.0 fL   MCH 34.6 (*) 26.0 - 34.0 pg   MCHC 37.0 (*) 30.0 - 36.0 g/dL   RDW 29.5  62.1 - 30.8 %   Platelets 182  150 - 400 K/uL  MAGNESIUM     Status: None   Collection Time    03/06/13  8:43 AM      Result Value Range   Magnesium 2.0  1.5 - 2.5 mg/dL  CATECHOLAMINES, FRACTIONATED, PLASMA      Status: None   Collection Time    03/06/13  2:00 PM      Result Value Range   Norepinephrine 294     Epinephrine 41     Dopamine REPORT     Comment: (NOTE)     Results are below reportable range for this analyte,     which is 30 pg/mL.   Total Catecholamines (Nor+Epi) 335     Comment: (NOTE)     Adult Reference Ranges for Catecholamines, Plasma     Epinephrine         Supine:  LESS THAN 50 pg/mL                        Upright: LESS THAN 95 pg/mL     Norepinephrine      Supine:  112-658 pg/mL                        Upright: [id number] pg/mL     Dopamine            Supine:  LESS THAN 30 pg/mL                        Upright: LESS THAN 30 pg/mL     Total (N+E)         Supine:  123-671 pg/mL                        Upright: [phone] pg/mL     Pediatric Reference Ranges for Catecholamines, Plasma     Due to stress, plasma catecholamine levels are     generally unreliable in infants and small children.     Urinary catecholamine assays are more reliable.     Epinephrine         Supine:  LESS THAN OR EQUAL       3-15 Years                TO 464 pg/mL                        Upright: Not Available     Norepinephrine      Supine:  LESS THAN OR EQUAL       3-15 Years                TO  1251 pg/mL                        Upright: Not Available     Dopamine            Supine:  LESS THAN 60 pg/mL       3-15 Years       Upright: Not Available     Pediatric data from [facility] [phone].     Performed at [facility]  BASIC METABOLIC PANEL     Status: Abnormal   Collection Time    03/06/13  2:49 PM      Result Value Range   Sodium 138  135 - 145 mEq/L   Potassium 3.4 (*) 3.5 - 5.1 mEq/L   Comment: HEMOLYZED SPECIMEN, RESULTS MAY BE AFFECTED   Chloride 101  96 - 112 mEq/L   CO2 27  19 - 32 mEq/L   Glucose, Bld 141 (*) 70 - 99 mg/dL   BUN 9  6 - 23 mg/dL   Creatinine, Ser 1.30  0.50 - 1.10 mg/dL   Calcium 9.1  8.4 - 86.5 mg/dL   GFR calc non Af Amer 73 (*) >90 mL/min   GFR calc Af Amer 85  (*) >90 mL/min   Comment: (NOTE)     The eGFR has been calculated using the CKD EPI equation.     This calculation has not been validated in all clinical situations.     eGFR's persistently <90 mL/min signify possible Chronic Kidney     Disease.  NA AND K (SODIUM & POTASSIUM), RAND UR     Status: None   Collection Time    03/06/13  3:24 PM      Result Value Range   Sodium, Ur 61     Potassium Urine Timed 30    CREATININE, URINE, RANDOM     Status: None   Collection Time    03/06/13  3:24 PM      Result Value Range   Creatinine, Urine 113.98    URINALYSIS, ROUTINE W REFLEX MICROSCOPIC     Status: Abnormal   Collection Time    03/06/13  3:24 PM      Result Value Range   Color, Urine YELLOW  YELLOW   APPearance CLEAR  CLEAR   Specific Gravity, Urine 1.031 (*) 1.005 - 1.030   pH 7.0  5.0 - 8.0   Glucose, UA NEGATIVE  NEGATIVE mg/dL   Hgb urine dipstick NEGATIVE  NEGATIVE   Bilirubin Urine NEGATIVE  NEGATIVE   Ketones, ur NEGATIVE  NEGATIVE mg/dL   Protein, ur NEGATIVE  NEGATIVE mg/dL   Urobilinogen, UA 1.0  0.0 - 1.0 mg/dL   Nitrite NEGATIVE  NEGATIVE   Leukocytes, UA NEGATIVE  NEGATIVE   Comment: MICROSCOPIC NOT DONE ON URINES WITH NEGATIVE PROTEIN, BLOOD, LEUKOCYTES, NITRITE, OR GLUCOSE <1000 mg/dL.  ALDOSTERONE + RENIN ACTIVITY W/ RATIO     Status: Abnormal   Collection Time    03/06/13  9:00 PM      Result Value Range   PRA LC/MS/MS 0.15 (*) 0.25 - 5.82 ng/mL/h   ALDO / PRA Ratio 406.7 (*) 0.9 - 28.9 Ratio   Aldosterone 61 (*)    Comment: (NOTE)       Adult Reference Ranges for Aldosterone,       LC/MS/MS:        Upright 8:00-10:00 am    < or = 28 ng/dL  Upright 4:00-6:00 pm     < or = 21 ng/dL        Supine  1:61-09:60 am    3-16 ng/dL     Performed at [facility], [place]  CBC     Status: Abnormal   Collection Time    03/07/13  6:15 AM      Result Value Range   WBC 4.4  4.0 - 10.5 K/uL   RBC 4.76  3.87 - 5.11 MIL/uL   Hemoglobin 16.7 (*) 12.0 - 15.0 g/dL    HCT 45.4  09.8 - 11.9 %   MCV 95.0  78.0 - 100.0 fL   MCH 35.1 (*) 26.0 - 34.0 pg   MCHC 36.9 (*) 30.0 - 36.0 g/dL   RDW 14.7  82.9 - 56.2 %   Platelets 196  150 - 400 K/uL  COMPREHENSIVE METABOLIC PANEL     Status: Abnormal   Collection Time    03/07/13  6:15 AM      Result Value Range   Sodium 141  135 - 145 mEq/L   Potassium 3.1 (*) 3.5 - 5.1 mEq/L   Chloride 101  96 - 112 mEq/L   CO2 27  19 - 32 mEq/L   Glucose, Bld 108 (*) 70 - 99 mg/dL   BUN 11  6 - 23 mg/dL   Creatinine, Ser 1.30  0.50 - 1.10 mg/dL   Calcium 9.7  8.4 - 86.5 mg/dL   Total Protein 8.4 (*) 6.0 - 8.3 g/dL   Albumin 4.1  3.5 - 5.2 g/dL   AST 22  0 - 37 U/L   ALT 26  0 - 35 U/L   Alkaline Phosphatase 132 (*) 39 - 117 U/L   Total Bilirubin 0.6  0.3 - 1.2 mg/dL   GFR calc non Af Amer >90  >90 mL/min   GFR calc Af Amer >90  >90 mL/min   Comment: (NOTE)     The eGFR has been calculated using the CKD EPI equation.     This calculation has not been validated in all clinical situations.     eGFR's persistently <90 mL/min signify possible Chronic Kidney     Disease.  MAGNESIUM     Status: None   Collection Time    03/07/13  6:15 AM      Result Value Range   Magnesium 2.2  1.5 - 2.5 mg/dL  BASIC METABOLIC PANEL     Status: Abnormal   Collection Time    03/07/13  1:47 PM      Result Value Range   Sodium 138  135 - 145 mEq/L   Potassium 3.2 (*) 3.5 - 5.1 mEq/L   Chloride 101  96 - 112 mEq/L   CO2 26  19 - 32 mEq/L   Glucose, Bld 120 (*) 70 - 99 mg/dL   BUN 12  6 - 23 mg/dL   Creatinine, Ser 7.84  0.50 - 1.10 mg/dL   Calcium 9.5  8.4 - 69.6 mg/dL   GFR calc non Af Amer >90  >90 mL/min   GFR calc Af Amer >90  >90 mL/min   Comment: (NOTE)     The eGFR has been calculated using the CKD EPI equation.     This calculation has not been validated in all clinical situations.     eGFR's persistently <90 mL/min signify possible Chronic Kidney     Disease.  METANEPHRINES, URINE, 24 HOUR     Status: None    Collection  Time    03/07/13  2:48 PM      Result Value Range   Volume, Urine-METAN 3000     Metanephrines, Ur 104  90 - 315 mcg/24 h   Normetanephrine, 24H Ur 632  122 - 676 mcg/24 h   Metaneph Total, Ur 736  224 - 832 mcg/24 h   Comment: (NOTE)     A four fold elevation of urinary normetanephrines     is extremely likely to be due to a tumor, while a     four fold elevation of urinary metanephrines is     highly suggestive, but not diagnostic of the tumor.     Measurement of plasma Metanephrines and Chromogranin     A is recommended for confirmation.     Performed at [facility]  CATECHOLAMINES, FRACTIONATED, URINE, 24 HOUR     Status: None   Collection Time    03/07/13  2:48 PM      Result Value Range   Creatinine, Urine mg/day-CATEUR 0.94  0.63 - 2.50 g/24 h   Catecholamines T 46  26 - 121 mcg/24 h   Epinephrine 24 Hr Urine 7  2 - 24 mcg/24 h   Norepinephrine 24 Hr Urine 39  15 - 100 mcg/24 h   Dopamine 24 Hr Urine 172  52 - 480 mcg/24 h   Total urine volume 3000     Comment: Performed at [facility]    RADIOGRAPHIC STUDIES: I have personally reviewed the radiological images as listed and agreed with the findings in the report. Ct Abdomen Pelvis Wo Contrast  03/06/2013   CLINICAL DATA:  Hypertension, evaluate for adrenal hyperplasia versus adenoma  EXAM: CT ABDOMEN AND PELVIS WITHOUT CONTRAST  TECHNIQUE: Multidetector CT imaging of the abdomen and pelvis was performed following the standard protocol without intravenous contrast.  COMPARISON:  None.  FINDINGS: Lung bases are essentially clear.  Cardiomegaly.  10 mm cyst in the medial segment left hepatic lobe (series 8/ image 56).  Spleen, pancreas, and right adrenal glands are within normal limits.  2.1 x 1.6 cm left adrenal nodule (series 8/image 50). Washout characteristics are typical of an adrenal adenoma (nodule measures 10, 70, and 23 HUs on all three phases).  Kidneys are within normal limits. No renal calculi or  hydronephrosis.  No evidence of bowel obstruction.  Normal appendix.  Mild atherosclerotic calcifications of the abdominal aorta and branch vessels. No evidence of abdominal aortic aneurysm.  No abdominopelvic ascites.  No suspicious abdominopelvic lymphadenopathy.  Uterus and bilateral ovaries are unremarkable.  Mildly thick-walled bladder.  Mild degenerative changes of the visualized thoracolumbar spine.  IMPRESSION: 2.1 x 1.6 cm left adrenal adenoma.  Additional ancillary findings as above.   Electronically Signed   By: Charline Bills M.D.   On: 03/06/2013 12:11   Ct Abdomen W Contrast  03/08/2013   ADDENDUM REPORT: 03/08/2013 08:46  ADDENDUM: Addended to correct the exam title:  CT ABDOMEN WITH AND WITHOUT CONTRAST  CT PELVIS WITHOUT CONTRAST  The remainder of the report, including the findings and impression, remain unchanged.   Electronically Signed   By: Charline Bills M.D.   On: 03/08/2013 08:46   03/08/2013   CLINICAL DATA:  Hypertension, evaluate for adrenal hyperplasia versus adenoma  EXAM: CT ABDOMEN AND PELVIS WITHOUT CONTRAST  TECHNIQUE: Multidetector CT imaging of the abdomen and pelvis was performed following the standard protocol without intravenous contrast.  COMPARISON:  None.  FINDINGS: Lung bases are essentially clear.  Cardiomegaly.  10 mm cyst in the medial segment left hepatic lobe (series 8/ image 56).  Spleen, pancreas, and right adrenal glands are within normal limits.  2.1 x 1.6 cm left adrenal nodule (series 8/image 50). Washout characteristics are typical of an adrenal adenoma (nodule measures 10, 70, and 23 HUs on all three phases).  Kidneys are within normal limits. No renal calculi or hydronephrosis.  No evidence of bowel obstruction. Normal appendix.  Mild atherosclerotic calcifications of the abdominal aorta and branch vessels. No evidence of abdominal aortic aneurysm.  No abdominopelvic ascites.  No suspicious abdominopelvic lymphadenopathy.  Uterus and bilateral  ovaries are unremarkable.  Mildly thick-walled bladder.  Mild degenerative changes of the visualized thoracolumbar spine.  IMPRESSION: 2.1 x 1.6 cm left adrenal adenoma.  Additional ancillary findings as above.  Electronically Signed: By: Charline Bills M.D. On: 03/08/2013 08:31    ASSESSMENT:  #1 History of left lower extremity DVT #2 Positive tests for lupus anticoagulant #3 smoker  PLAN:  I reviewed with the patient about the plan for care for her history of DVT. The patient had underwent extensive testing recently. The only abnormalities found is the persistence of elevated aPTT. The elevated aPTT was not corrected by mixing study. DRVVT screen were within normal limits. Anti-cardiolipin antibody was mildly elevated. The patient had history of autoimmune disease and currently on methotrexate. That could probably accounts for the positive testing for lupus anticoagulant. The patient would be at risk of having recurrence of DVT due to her smoking habit.  I recommend treatment with aspirin, 2 tablets a day with food to reduce her risk of blood clot.  Finally, at the end of our consultation today, I reinforced the importance of preventive strategies such as avoiding hormonal supplement, avoiding cigarette smoking, keeping up-to-date with screening programs for early cancer detection, frequent ambulation for long distance travel and aggressive DVT prophylaxis in all surgical settings. Specifically, I reinforced the importance of smoking cessation and gave her phone number to call to quit line. I have not made a return appointment for the patient to come back.  All questions were answered. The patient knows to call the clinic with any problems, questions or concerns. I spent 40 minutes counseling the patient face to face. The total time spent in the appointment was 55 minutes and more than 50% was on counseling.     Alexandre Faries, MD 03/22/2013 11:55 AM

## 2013-03-22 NOTE — Progress Notes (Signed)
Checked in new patient with no financial issues. She has appt card and has not been to [place]. °

## 2013-03-22 NOTE — Patient Instructions (Signed)
Lupus Anticoagulant This test is used to help evaluate a prolonged activated partial thromboplastin time (aPTT) and/or a clotting episode. It is also used to help determine the cause of recurrent miscarriages, especially in the 2nd and 3rd trimesters. It is also used as a part of an evaluation for antiphospholipid syndrome. This is not a diagnostic test for lupus. Lupus anticoagulant is a protein that increases the risk of developing blood clots in both the veins and arteries. These clots may block blood flow in any part of the body, leading to strokes, heart attacks, pulmonary embolisms, deep vein thrombosis (usually clots in the legs), and to recurrent fetal loss, especially in the 2nd and 3rd trimesters (thought to be related to clotting in placental blood vessels). The lupus anticoagulant is an acquired, not inherited, condition. Although it is found most frequently in those with autoimmune diseases, such as Systemic Lupus Erythematosus (SLE) and HIV, the lupus anticoagulant may also be seen chronically or temporarily in those with infections or cancers and in those who are taking certain medications, such as phenothiazines, chlorpromazine, procainamide, and fansidar. It is thought to be present in about 1  2% of the general population, and may develop in people with no known risk factors.  The lupus anticoagulant (LA) is not a diagnostic test for lupus. It gets its name because it was first discovered to be associated with SLE.  PREPARATION FOR TEST  No preparation or fasting is necessary unless you have been informed otherwise. A blood sample is obtained by inserting a needle into a vein in the arm. NORMAL FINDINGS  Less than 23 GPL (IgG phospholipid units).  Less than 11 MPL (IgL phospholipid units). Ranges for normal findings may vary among different laboratories and hospitals. You should always check with your doctor after having lab work or other tests done to discuss the meaning of your test  results and whether your values are considered within normal limits. MEANING OF TEST  Your caregiver will go over the test results with you and discuss the importance and meaning of your results, as well as treatment options and the need for additional tests if necessary. OBTAINING THE TEST RESULTS It is your responsibility to obtain your test results. Ask the lab or department performing the test when and how you will get your results. Document Released: 05/08/2004 Document Revised: 06/28/2011 Document Reviewed: 03/18/2008 [facility] Patient Information 2014 [place], [place].

## 2013-03-26 ENCOUNTER — Ambulatory Visit: Payer: Medicare Other

## 2013-04-11 ENCOUNTER — Other Ambulatory Visit (HOSPITAL_COMMUNITY): Payer: Self-pay | Admitting: Internal Medicine

## 2013-04-11 ENCOUNTER — Ambulatory Visit (HOSPITAL_COMMUNITY)
Admission: RE | Admit: 2013-04-11 | Discharge: 2013-04-11 | Disposition: A | Discharge status: Home / Self Care | Payer: Medicare Other | Source: Ambulatory Visit | Attending: Vascular Surgery | Admitting: Vascular Surgery

## 2013-04-11 DIAGNOSIS — I82819 Embolism and thrombosis of superficial veins of unspecified lower extremities: Secondary | ICD-10-CM | POA: Insufficient documentation

## 2013-04-11 DIAGNOSIS — R52 Pain, unspecified: Secondary | ICD-10-CM

## 2013-04-11 DIAGNOSIS — M79609 Pain in unspecified limb: Secondary | ICD-10-CM | POA: Insufficient documentation

## 2013-05-17 ENCOUNTER — Other Ambulatory Visit (HOSPITAL_COMMUNITY): Payer: Self-pay | Admitting: Interventional Radiology

## 2013-05-17 DIAGNOSIS — D3500 Benign neoplasm of unspecified adrenal gland: Secondary | ICD-10-CM

## 2013-05-18 ENCOUNTER — Encounter (HOSPITAL_COMMUNITY): Payer: Self-pay | Admitting: Pharmacy Technician

## 2013-05-18 ENCOUNTER — Other Ambulatory Visit: Payer: Self-pay | Admitting: Radiology

## 2013-05-21 ENCOUNTER — Ambulatory Visit (HOSPITAL_COMMUNITY)
Admission: RE | Admit: 2013-05-21 | Discharge: 2013-05-21 | Disposition: A | Discharge status: Home / Self Care | Payer: Medicare Other | Source: Ambulatory Visit | Attending: Interventional Radiology | Admitting: Interventional Radiology

## 2013-05-21 ENCOUNTER — Encounter (HOSPITAL_COMMUNITY): Payer: Self-pay

## 2013-05-21 ENCOUNTER — Other Ambulatory Visit (HOSPITAL_COMMUNITY): Payer: Self-pay | Admitting: Interventional Radiology

## 2013-05-21 DIAGNOSIS — F17200 Nicotine dependence, unspecified, uncomplicated: Secondary | ICD-10-CM | POA: Insufficient documentation

## 2013-05-21 DIAGNOSIS — Z7901 Long term (current) use of anticoagulants: Secondary | ICD-10-CM | POA: Insufficient documentation

## 2013-05-21 DIAGNOSIS — R011 Cardiac murmur, unspecified: Secondary | ICD-10-CM | POA: Insufficient documentation

## 2013-05-21 DIAGNOSIS — H409 Unspecified glaucoma: Secondary | ICD-10-CM | POA: Insufficient documentation

## 2013-05-21 DIAGNOSIS — G4730 Sleep apnea, unspecified: Secondary | ICD-10-CM | POA: Insufficient documentation

## 2013-05-21 DIAGNOSIS — F329 Major depressive disorder, single episode, unspecified: Secondary | ICD-10-CM | POA: Insufficient documentation

## 2013-05-21 DIAGNOSIS — E269 Hyperaldosteronism, unspecified: Secondary | ICD-10-CM | POA: Insufficient documentation

## 2013-05-21 DIAGNOSIS — F3289 Other specified depressive episodes: Secondary | ICD-10-CM | POA: Insufficient documentation

## 2013-05-21 DIAGNOSIS — F259 Schizoaffective disorder, unspecified: Secondary | ICD-10-CM | POA: Insufficient documentation

## 2013-05-21 DIAGNOSIS — Z86718 Personal history of other venous thrombosis and embolism: Secondary | ICD-10-CM | POA: Insufficient documentation

## 2013-05-21 DIAGNOSIS — E876 Hypokalemia: Secondary | ICD-10-CM | POA: Insufficient documentation

## 2013-05-21 DIAGNOSIS — G5690 Unspecified mononeuropathy of unspecified upper limb: Secondary | ICD-10-CM | POA: Insufficient documentation

## 2013-05-21 DIAGNOSIS — D3500 Benign neoplasm of unspecified adrenal gland: Secondary | ICD-10-CM | POA: Insufficient documentation

## 2013-05-21 DIAGNOSIS — M129 Arthropathy, unspecified: Secondary | ICD-10-CM | POA: Insufficient documentation

## 2013-05-21 DIAGNOSIS — I10 Essential (primary) hypertension: Secondary | ICD-10-CM | POA: Insufficient documentation

## 2013-05-21 LAB — BASIC METABOLIC PANEL
BUN: 12 mg/dL (ref 6–23)
CHLORIDE: 100 meq/L (ref 96–112)
CO2: 25 meq/L (ref 19–32)
Calcium: 9.8 mg/dL (ref 8.4–10.5)
Creatinine, Ser: 0.84 mg/dL (ref 0.50–1.10)
GFR calc Af Amer: >90 mL/min (ref >90)
GFR calc non Af Amer: 78 mL/min — ABNORMAL LOW (ref >90)
GLUCOSE: 103 mg/dL — AB (ref 70–99)
POTASSIUM: 4.6 meq/L (ref 3.7–5.3)
Sodium: 139 mEq/L (ref 137–147)

## 2013-05-21 LAB — PROTIME-INR
INR: 1.06 (ref 0.00–1.49)
Prothrombin Time: 13.6 seconds (ref 11.6–15.2)

## 2013-05-21 LAB — CORTISOL: Cortisol, Plasma: 12.4 ug/dL

## 2013-05-21 LAB — CBC
HEMATOCRIT: 40.8 % (ref 36.0–46.0)
HEMOGLOBIN: 14.8 g/dL (ref 12.0–15.0)
MCH: 35.3 pg — AB (ref 26.0–34.0)
MCHC: 36.3 g/dL — ABNORMAL HIGH (ref 30.0–36.0)
MCV: 97.4 fL (ref 78.0–100.0)
Platelets: 194 10*3/uL (ref 150–400)
RBC: 4.19 MIL/uL (ref 3.87–5.11)
RDW: 13.2 % (ref 11.5–15.5)
WBC: 4.1 10*3/uL (ref 4.0–10.5)

## 2013-05-21 LAB — APTT: aPTT: 30 seconds (ref 24–37)

## 2013-05-21 MED ORDER — MIDAZOLAM HCL 2 MG/2ML IJ SOLN
INTRAMUSCULAR | Status: AC | PRN
  Administered 2013-05-21: 2 mg via INTRAVENOUS
  Administered 2013-05-21 (×4): 1 mg via INTRAVENOUS

## 2013-05-21 MED ORDER — FENTANYL CITRATE 0.05 MG/ML IJ SOLN
INTRAMUSCULAR | Status: AC
  Filled 2013-05-21: qty 2

## 2013-05-21 MED ORDER — IOHEXOL 300 MG/ML  SOLN
100.0000 mL | Freq: Once | INTRAMUSCULAR | Status: AC | PRN
  Administered 2013-05-21: 60 mL via INTRAVENOUS

## 2013-05-21 MED ORDER — FENTANYL CITRATE 0.05 MG/ML IJ SOLN
INTRAMUSCULAR | Status: AC | PRN
  Administered 2013-05-21 (×6): 50 ug via INTRAVENOUS

## 2013-05-21 MED ORDER — MIDAZOLAM HCL 2 MG/2ML IJ SOLN
INTRAMUSCULAR | Status: AC
  Filled 2013-05-21: qty 6

## 2013-05-21 MED ORDER — SODIUM CHLORIDE 0.9 % IV SOLN: INTRAVENOUS | Status: DC

## 2013-05-21 MED ORDER — SODIUM CHLORIDE 0.9 % IV SOLN
Freq: Once | INTRAVENOUS | Status: AC
Start: 2013-05-21 — End: 2013-05-21
  Administered 2013-05-21: 07:00:00 via INTRAVENOUS

## 2013-05-21 MED ORDER — FENTANYL CITRATE 0.05 MG/ML IJ SOLN
INTRAMUSCULAR | Status: AC
  Filled 2013-05-21: qty 4

## 2013-05-21 MED ORDER — NITROGLYCERIN 1 MG/10 ML FOR IR/CATH LAB
INTRA_ARTERIAL | Status: AC
  Administered 2013-05-21: 200 ug via INTRAVENOUS
  Filled 2013-05-21: qty 10

## 2013-05-21 MED ORDER — SODIUM CHLORIDE 0.9 % IV SOLN
Freq: Once | INTRAVENOUS | Status: AC
  Administered 2013-05-21: 11:00:00 via INTRAVENOUS
  Filled 2013-05-21: qty 250

## 2013-05-21 NOTE — Progress Notes (Signed)
Discharge instruction given per MD order.  Pt was able to verbalize understanding.  Pt to car via wheelchair. 

## 2013-05-21 NOTE — H&P (Signed)
Agree.  For adrenal vein sampling today.

## 2013-05-21 NOTE — Progress Notes (Signed)
Received pt fom Radiology procedure alert and complain of chronic back and knee pain from arthritis.

## 2013-05-21 NOTE — H&P (Signed)
Jeanne [name] is an 53 y.o. female.   Chief Complaint: Pt has moved to [place] from [place] approx 1.5 yrs ago Developed dizziness; tingling in fingers and was evaluated by PMD - Dr Ardeth Perfect. Hypokalemia/ hyperaldosterone and referred to Dr Forde Dandy.  Further work up- CT revealed L adrenal adenoma Has been using potassium supplement; sxs continue although better Now scheduled for adrenal venous sampling  HPI: HTN; glaucoma; Schizo- affective disorder; OSA; hypokalemia Hx DVT 06/2012- on Xarelto x 6 mo  Past Medical History  Diagnosis Date  . Hypertension   . Glaucoma   . Gastritis   . Schizo-affective psychosis   . Depression   . DVT (deep venous thrombosis)     on Xaralto  . Heart murmur   . Hypokalemia 03/05/2013  . Neuromuscular disorder     " NEUROPATHY IN  MY HANDS"  . Arthritis   . Sleep apnea   . Scleritis of both eyes     Past Surgical History  Procedure Laterality Date  . Cervical conization w/bx    . Bunionectomy    . Lymph node dissection      Family History  Problem Relation Age of Onset  . Cancer Maternal Aunt     breast ca   Social History:  reports that she has been smoking Cigarettes.  She has a 12.5 pack-year smoking history. She has never used smokeless tobacco. She reports that she drinks alcohol. She reports that she does not use illicit drugs.  Allergies:  Allergies  Allergen Reactions  . Ace Inhibitors Anaphylaxis  . Travatan [Travoprost] Other (See Comments)    Red eyes, irritation      (Not in a hospital admission)  Results for orders placed during the hospital encounter of 05/21/13 (from the past 48 hour(s))  APTT     Status: None   Collection Time    05/21/13  7:07 AM      Result Value Range   aPTT 30  24 - 37 seconds  BASIC METABOLIC PANEL     Status: Abnormal   Collection Time    05/21/13  7:07 AM      Result Value Range   Sodium 139  137 - 147 mEq/L   Potassium 4.6  3.7 - 5.3 mEq/L   Chloride 100  96 - 112 mEq/L   CO2 25  19 - 32  mEq/L   Glucose, Bld 103 (*) 70 - 99 mg/dL   BUN 12  6 - 23 mg/dL   Creatinine, Ser 0.84  0.50 - 1.10 mg/dL   Calcium 9.8  8.4 - 10.5 mg/dL   GFR calc non Af Amer 78 (*) >90 mL/min   GFR calc Af Amer >90  >90 mL/min   Comment: (NOTE)     The eGFR has been calculated using the CKD EPI equation.     This calculation has not been validated in all clinical situations.     eGFR's persistently <90 mL/min signify possible Chronic Kidney     Disease.  CBC     Status: Abnormal   Collection Time    05/21/13  7:07 AM      Result Value Range   WBC 4.1  4.0 - 10.5 K/uL   RBC 4.19  3.87 - 5.11 MIL/uL   Hemoglobin 14.8  12.0 - 15.0 g/dL   HCT 40.8  36.0 - 46.0 %   MCV 97.4  78.0 - 100.0 fL   MCH 35.3 (*) 26.0 - 34.0 pg   MCHC 36.3 (*)  30.0 - 36.0 g/dL   RDW 13.2  11.5 - 15.5 %   Platelets 194  150 - 400 K/uL  PROTIME-INR     Status: None   Collection Time    05/21/13  7:07 AM      Result Value Range   Prothrombin Time 13.6  11.6 - 15.2 seconds   INR 1.06  0.00 - 1.49   No results found.  Review of Systems  Constitutional: Negative for fever and weight loss.  HENT: Negative for hearing loss.   Eyes: Negative for blurred vision.  Respiratory: Negative for shortness of breath.   Cardiovascular: Negative for chest pain.  Gastrointestinal: Negative for nausea, vomiting and abdominal pain.  Neurological: Positive for dizziness, tingling and weakness. Negative for headaches.  Psychiatric/Behavioral: Positive for substance abuse. The patient is not nervous/anxious.        Smoker    Blood pressure 140/67, pulse 77, temperature 98.1 F (36.7 C), temperature source Oral, resp. rate 20, height $RemoveBe'5\' 11"'SIRVQemfP$  (1.803 m), weight 265 lb (120.203 kg), SpO2 94.00%. Physical Exam  Constitutional: She is oriented to person, place, and time. She appears well-nourished.  Cardiovascular: Normal rate and regular rhythm.   No murmur heard. Respiratory: Effort normal and breath sounds normal. She has no wheezes.   GI: Soft. Bowel sounds are normal. There is no tenderness.  Musculoskeletal: Normal range of motion.  Neurological: She is alert and oriented to person, place, and time.  Skin: Skin is warm and dry.  Psychiatric: She has a normal mood and affect. Her behavior is normal. Judgment and thought content normal.     Assessment/Plan Sxs of tingling in fingers and light headedness 02/2013; found to have hypokalemia/hyperaldosteronism Further work up revealed L adrenal adenoma Now scheduled for Adrenal venous sampling Pt aware of procedure benefits and risks and agreeable to proceed Consent signed and in chart  Jackson A 05/21/2013, 8:01 AM

## 2013-05-21 NOTE — Discharge Instructions (Signed)
Venogram, Care After ° °Refer to this sheet in the next few weeks. These instructions provide you with information on caring for yourself after your procedure. Your health care provider may also give you more specific instructions. Your treatment has been planned according to current medical practices, but problems sometimes occur. Call your health care provider if you have any problems or questions after your procedure. °WHAT TO EXPECT AFTER THE PROCEDURE °After your procedure, it is typical to have the following sensations: °· Mild discomfort at the catheter insertion site. °HOME CARE INSTRUCTIONS  °· Take all medicines exactly as directed. °· Follow any prescribed diet. °· Follow instructions regarding both rest and physical activity. °· Drink more fluids for the first several days after the procedure, in order to help flush dye from your kidneys. °SEEK MEDICAL CARE IF: °· You develop a rash. °SEEK IMMEDIATE MEDICAL CARE IF °· You have fever not controlled by medicine. °· There is pain, drainage, bleeding, redness, swelling, warmth or a red streak at the site of the IV tube. °· The extremity where your IV tube was placed becomes discolored, numb, or cool. °· You have difficulty breathing or shortness of breath. °· You develop chest pain. °· You have excessive dizziness or fainting. °Document Released: 01/24/2013 Document Reviewed: 12/11/2012 °[employer]® Patient Information ©2014 [employer]. ° °

## 2013-05-21 NOTE — Procedures (Signed)
Procedure:  Bilateral renal and adrenal venography with bilateral venous sampling Findings:  Left adrenal vein catheterized with microcatheter.  Right adrenal vein catheterized, but due to small size, spasm and poor flow, sampling was performed at the orifice of the vessel. Prestimulation sampling was followed by post-ACTH stimulation sampling after bolus of 100 mcg ACTH and infusion of 50 mcg/hr. Both common femoral veins accessed.

## 2013-05-22 ENCOUNTER — Other Ambulatory Visit (HOSPITAL_COMMUNITY): Payer: Self-pay | Admitting: Interventional Radiology

## 2013-05-22 DIAGNOSIS — D3500 Benign neoplasm of unspecified adrenal gland: Secondary | ICD-10-CM

## 2013-05-22 LAB — CORTISOL
CORTISOL PLASMA: 13.3 ug/dL
CORTISOL PLASMA: 25.8 ug/dL
CORTISOL PLASMA: 3.1 ug/dL
Cortisol, Plasma: 4.8 ug/dL
Cortisol, Plasma: 4.8 ug/dL
Cortisol, Plasma: 23.5 ug/dL
Cortisol, Plasma: 30.8 ug/dL

## 2013-05-24 LAB — ALDOSTERONE
ALDOSTERONE, SERUM: 5 ng/dL
ALDOSTERONE, SERUM: 101 ng/dL
ALDOSTERONE, SERUM: 279 ng/dL
ALDOSTERONE, SERUM: 7 ng/dL
Aldosterone, Serum: 27 ng/dL
Aldosterone, Serum: 6 ng/dL
Aldosterone, Serum: 287 ng/dL
Aldosterone, Serum: 105 ng/dL

## 2013-05-30 LAB — ALDOSTERONE: Aldosterone, Serum: 29122 ng/dL

## 2013-06-15 ENCOUNTER — Encounter: Payer: Self-pay | Admitting: Neurology

## 2013-06-19 ENCOUNTER — Encounter: Payer: Self-pay | Admitting: Neurology

## 2013-06-19 ENCOUNTER — Ambulatory Visit (INDEPENDENT_AMBULATORY_CARE_PROVIDER_SITE_OTHER): Payer: Medicare Other | Admitting: Neurology

## 2013-06-19 VITALS — Ht 70.0 in | Wt 259.0 lb

## 2013-06-19 DIAGNOSIS — R209 Unspecified disturbances of skin sensation: Secondary | ICD-10-CM

## 2013-06-19 DIAGNOSIS — M359 Systemic involvement of connective tissue, unspecified: Secondary | ICD-10-CM

## 2013-06-19 DIAGNOSIS — R202 Paresthesia of skin: Secondary | ICD-10-CM
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
## 2013-06-20 ENCOUNTER — Encounter: Payer: Medicare Other | Admitting: Radiology

## 2013-07-03 ENCOUNTER — Encounter (INDEPENDENT_AMBULATORY_CARE_PROVIDER_SITE_OTHER): Payer: Self-pay | Admitting: Surgery

## 2013-07-03 ENCOUNTER — Ambulatory Visit (INDEPENDENT_AMBULATORY_CARE_PROVIDER_SITE_OTHER): Payer: Medicare Other | Admitting: Surgery

## 2013-07-03 VITALS — BP 130/86 | HR 76 | Temp 97.4°F | Resp 14 | Ht 71.0 in | Wt 263.4 lb

## 2013-07-03 DIAGNOSIS — D3500 Benign neoplasm of unspecified adrenal gland: Secondary | ICD-10-CM

## 2013-07-03 DIAGNOSIS — D3502 Benign neoplasm of left adrenal gland: Secondary | ICD-10-CM

## 2013-07-03 DIAGNOSIS — E269 Hyperaldosteronism, unspecified: Secondary | ICD-10-CM

## 2013-07-03 DIAGNOSIS — E876 Hypokalemia: Secondary | ICD-10-CM

## 2013-07-03 NOTE — Progress Notes (Signed)
General Surgery [facility]  Chief Complaint  Patient presents with  . New Evaluation    left adrenal adenoma, hyperaldosteronism - referral from Dr. Carrolyn Meiers    HISTORY: Patient is a 53 year old female referred by her endocrinologist for evaluation of newly identified left adrenal adenoma. Patient had been hospitalized in November 2014 with hypokalemia. Evaluation included a CT scan of the abdomen and pelvis on 03/06/2013. This showed a 2.1 cm left adrenal adenoma. Subsequent selective venous sampling was performed. This showed lateralization of elevated aldosterone level and cortisol level to the left adrenal vein. Patient is now referred for consideration for left adrenalectomy for management of hyperaldosteronism.  Patient has had no prior abdominal surgery. She has undergone a conization procedure by gynecology.  There is no family history of endocrine neoplasms.  Past Medical History  Diagnosis Date  . Hypertension   . Glaucoma   . Gastritis   . Schizo-affective psychosis   . Depression   . DVT (deep venous thrombosis)   . Heart murmur   . Hypokalemia 03/05/2013  . Neuromuscular disorder     " NEUROPATHY IN  MY HANDS"  . Arthritis   . Sleep apnea     Bipap  . Scleritis of both eyes   . Lupus anticoagulant disorder   . Conn disease   . Clotting disorder     Current Outpatient Prescriptions  Medication Sig Dispense Refill  . aMILoride (MIDAMOR) 5 MG tablet Take 2 tabs by mouth twice daily  120 tablet  2  . amLODipine (NORVASC) 5 MG tablet Take 5 mg by mouth daily.      [name] aspirin EC 81 MG tablet Take 162 mg by mouth daily.      [name] buPROPion (WELLBUTRIN XL) 300 MG 24 hr tablet Take 300 mg by mouth daily.      . celecoxib (CELEBREX) 200 MG capsule Take 200 mg by mouth daily.      . DULoxetine (CYMBALTA) 60 MG capsule Take 60 mg by mouth daily.      [name] esomeprazole (NEXIUM) 40 MG capsule Take 40 mg by mouth daily before breakfast.      . folic acid  (FOLVITE) 1 MG tablet Take 1 mg by mouth daily.      [name] levobunolol (BETAGAN) 0.5 % ophthalmic solution Place 1 drop into both eyes at bedtime.      . methotrexate (RHEUMATREX) 2.5 MG tablet Take 10 mg by mouth every 7 (seven) days. Mondays      . NIFEdipine (PROCARDIA XL) 60 MG 24 hr tablet Take 120 mg by mouth daily.      . potassium chloride SA (K-DUR,KLOR-CON) 20 MEQ tablet Take 2 tablets by mouth twice dialy  120 tablet  2  . QUEtiapine (SEROQUEL) 400 MG tablet Take 400 mg by mouth 2 (two) times daily.      . verapamil (CALAN-SR) 240 MG CR tablet Take 240 mg by mouth daily.        No current facility-administered medications for this visit.    Allergies  Allergen Reactions  . Ace Inhibitors Anaphylaxis  . Travatan [Travoprost] Other (See Comments)    Red eyes, irritation     Family History  Problem Relation Age of Onset  . Cancer Maternal Aunt     breast ca  . Heart attack Maternal Grandmother 54  . Heart attack Maternal Grandfather 62  . Hypertension Sister   . Hypertension Sister     History   Social History  .  Marital Status: Single    Spouse Name: N/A    Number of Children: 0  . Years of Education: college   Occupational History  . Retired    Social History Main Topics  . Smoking status: Current Every Day Smoker -- 0.50 packs/day for 25 years    Types: Cigarettes  . Smokeless tobacco: Never Used  . Alcohol Use: Yes     Comment: " drink just on weekends "  . Drug Use: No  . Sexual Activity: None   Other Topics Concern  . None   Social History Narrative  . None    REVIEW OF SYSTEMS - PERTINENT POSITIVES ONLY: Hypertension. Hypokalemia. No abdominal pain. No prior abdominal surgery.  EXAM: Filed Vitals:   07/03/13 0909  BP: 130/86  Pulse: 76  Temp: 97.4 F (36.3 C)  Resp: 14    GENERAL: well-developed, well-nourished, no acute distress HEENT: normocephalic; pupils equal and reactive; sclerae clear; dentition good; mucous membranes  moist NECK:  No palpable nodules in the thyroid bed; symmetric on extension; no palpable anterior or posterior cervical lymphadenopathy; no supraclavicular masses; no tenderness CHEST: clear to auscultation bilaterally without rales, rhonchi, or wheezes CARDIAC: regular rate and rhythm without significant murmur; peripheral pulses are full ABDOMEN: soft without distension; bowel sounds present; no mass; no hepatosplenomegaly; no hernia EXT:  non-tender without edema; no deformity NEURO: no gross focal deficits; no sign of tremor   LABORATORY RESULTS: See [facility] (CHL-Epic) for most recent results  RADIOLOGY RESULTS: See [facility] (CHL-Epic) for most recent results  IMPRESSION: #1 hyperaldosteronism with left adrenal adenoma, 2.1 cm #2 hypertension #3 hypokalemia #4 lupus anticoagulant on aspirin therapy  PLAN: Patient and I discussed the above findings at length. We reviewed her CT scan and her selective venous sampling. I have recommended left adrenalectomy which should be possible by laparoscopic technique. We discussed the procedure, the potential complications including conversion to open surgery and possible need for splenectomy. We discussed the hospital stay to be anticipated and her recovery and return to activity. She understands and wishes to proceed in the near future.  The risks and benefits of the procedure have been discussed at length with the patient.  The patient understands the proposed procedure, potential alternative treatments, and the course of recovery to be expected.  All of the patient's questions have been answered at this time.  The patient wishes to proceed with surgery.  Earnstine Regal, MD, [facility]  Primary Care Physician: Velna Hatchet, MD

## 2013-07-03 NOTE — Patient Instructions (Signed)
  [facility]  LAPAROSCOPIC SURGERY - POST-OP INSTRUCTIONS  Always review your discharge instruction sheet given to you by the facility where your surgery was performed.  A prescription for pain medication may be given to you upon discharge.  Take your pain medication as prescribed.  If narcotic pain medicine is not needed, then you may take acetaminophen (Tylenol) or ibuprofen (Advil) as needed.  Take your usually prescribed medications unless otherwise directed.  If you need a refill on your pain medication, please contact your pharmacy.  They will contact our office to request authorization. Prescriptions will not be filled after 5 P.M. or on weekends.  You should follow a light diet the first few days after arrival home, such as soup and crackers or toast.  Be sure to include plenty of fluids daily.  Most patients will experience some swelling and bruising in the area of the incisions.  Ice packs will help.  Swelling and bruising can take several days to resolve.   It is common to experience some constipation if taking pain medication after surgery.  Increasing fluid intake and taking a stool softener (such as Colace) will usually help or prevent this problem from occurring.  A mild laxative (Milk of Magnesia or Miralax) should be taken according to package instructions if there are no bowel movements after 48 hours.  Unless discharge instructions indicate otherwise, you may remove your bandages 24-48 hours after surgery, and you may shower at that time.  You may have steri-strips (small skin tapes) in place directly over the incision.  These strips should be left on the skin for 7-10 days.  If your surgeon used skin glue on the incision, you may shower in 24 hours.  The glue will flake off over the next 2-3 weeks.  Any sutures or staples will be removed at the office during your follow-up visit.  ACTIVITIES:  You may resume regular (light) daily activities beginning the  next day-such as daily self-care, walking, climbing stairs-gradually increasing activities as tolerated.  You may have sexual intercourse when it is comfortable.  Refrain from any heavy lifting or straining until approved by your doctor.  You may drive when you are no longer taking prescription pain medication, you can comfortably wear a seatbelt, and you can safely maneuver your car and apply brakes.  You should see your doctor in the office for a follow-up appointment approximately 2-3 weeks after your surgery.  Make sure that you call for this appointment within a day or two after you arrive home to insure a convenient appointment time.  WHEN TO CALL YOUR DOCTOR: 1. Fever over 101.0 2. Inability to urinate 3. Continued bleeding from incision 4. Increased pain, redness, or drainage from the incision 5. Increasing abdominal pain  The clinic staff is available to answer your questions during regular business hours.  Please don't hesitate to call and ask to speak to one of the nurses for clinical concerns.  If you have a medical emergency, go to the nearest emergency room or call 911.  A surgeon from [facility] is always on call for the hospital.  Yuji Walth M. Aayden Cefalu, MD, FACS [facility] Office: [phone] Toll Free:  [phone] FAX [phone]  Web site: [email or web address] 

## 2013-07-05 ENCOUNTER — Encounter (HOSPITAL_COMMUNITY): Payer: Self-pay | Admitting: Pharmacy Technician

## 2013-07-06 ENCOUNTER — Encounter (HOSPITAL_COMMUNITY): Payer: Self-pay

## 2013-07-06 ENCOUNTER — Encounter (HOSPITAL_COMMUNITY)
Admission: RE | Admit: 2013-07-06 | Discharge: 2013-07-06 | Disposition: A | Discharge status: Home / Self Care | Payer: Medicare Other | Source: Ambulatory Visit | Attending: Surgery | Admitting: Surgery

## 2013-07-06 DIAGNOSIS — Z01812 Encounter for preprocedural laboratory examination: Secondary | ICD-10-CM | POA: Insufficient documentation

## 2013-07-06 HISTORY — DX: Hidradenitis suppurativa: L73.2

## 2013-07-06 HISTORY — DX: Antiphospholipid syndrome: D68.61

## 2013-07-06 LAB — BASIC METABOLIC PANEL
BUN: 15 mg/dL (ref 6–23)
CALCIUM: 9.4 mg/dL (ref 8.4–10.5)
CO2: 26 meq/L (ref 19–32)
Chloride: 102 mEq/L (ref 96–112)
Creatinine, Ser: 0.81 mg/dL (ref 0.50–1.10)
GFR calc Af Amer: >90 mL/min (ref >90)
GFR, EST NON AFRICAN AMERICAN: 81 mL/min — AB (ref >90)
Glucose, Bld: 95 mg/dL (ref 70–99)
Potassium: 4.6 mEq/L (ref 3.7–5.3)
SODIUM: 140 meq/L (ref 137–147)

## 2013-07-06 LAB — ABO/RH: ABO/RH(D): B POS

## 2013-07-06 LAB — APTT: APTT: 30 s (ref 24–37)

## 2013-07-06 LAB — PROTIME-INR
INR: 1.02 (ref 0.00–1.49)
PROTHROMBIN TIME: 13.2 s (ref 11.6–15.2)

## 2013-07-06 NOTE — Patient Instructions (Signed)
Jeanne [name]  07/06/2013                           YOUR PROCEDURE IS SCHEDULED ON: 07/09/13               PLEASE REPORT TO SHORT STAY CENTER AT : 6:30 AM               CALL THIS NUMBER IF ANY PROBLEMS THE DAY OF SURGERY :               [phone]                                REMEMBER:   Do not eat food or drink liquids AFTER MIDNIGHT                  Take these medicines the morning of surgery with A SIP OF WATER:  AMLODIPINE / WELLBUTRIN / CYMBALTA / NEXIUM / METHOTREXATE / PROCARDIA / VERAPAMIL    Do not wear jewelry, make-up   Do not wear lotions, powders, or perfumes.   Do not shave legs or underarms 12 hrs. before surgery (men may shave face)  Do not bring valuables to the hospital.  Contacts, dentures or bridgework may not be worn into surgery.  Leave suitcase in the car. After surgery it may be brought to your room.  For patients admitted to the hospital more than one night, checkout time is            11:00 AM                                                       The day of discharge.   Patients discharged the day of surgery will not be allowed to drive home.            If going home same day of surgery, must have someone stay with you              FIRST 24 hrs at home and arrange for some one to drive you              home from hospital.    Special Instructions             Please read over the following fact sheets that you were given:               1. [place]                2. STOP ASPIRIN / HERBAL MEDS 5 DAYS PREOP                                                X_____________________________________________________________________        Failure to follow these instructions may result in cancellation of your surgery

## 2013-07-06 NOTE — Progress Notes (Signed)
Lab is unable to locate blood for CBC - will need to be redrawn when pt arrives for surg

## 2013-07-08 MED ORDER — DEXTROSE 5 % IV SOLN
3.0000 g | INTRAVENOUS | Status: AC
  Administered 2013-07-09: 3 g via INTRAVENOUS
  Filled 2013-07-08: qty 3000

## 2013-07-09 ENCOUNTER — Encounter (HOSPITAL_COMMUNITY): Payer: Self-pay | Admitting: *Deleted

## 2013-07-09 ENCOUNTER — Encounter (HOSPITAL_COMMUNITY): Payer: Medicare Other | Admitting: Anesthesiology

## 2013-07-09 ENCOUNTER — Inpatient Hospital Stay (HOSPITAL_COMMUNITY)
Admission: RE | Admit: 2013-07-09 | Discharge: 2013-07-16 | DRG: 614 | Disposition: A | Discharge status: Home / Self Care | Payer: Medicare Other | Source: Ambulatory Visit | Attending: Surgery | Admitting: Surgery

## 2013-07-09 ENCOUNTER — Encounter (HOSPITAL_COMMUNITY)
Admission: RE | Disposition: A | Discharge status: Home / Self Care | Payer: Self-pay | Source: Ambulatory Visit | Attending: Surgery

## 2013-07-09 ENCOUNTER — Inpatient Hospital Stay (HOSPITAL_COMMUNITY): Payer: Medicare Other | Admitting: Anesthesiology

## 2013-07-09 DIAGNOSIS — G4733 Obstructive sleep apnea (adult) (pediatric): Secondary | ICD-10-CM | POA: Diagnosis present

## 2013-07-09 DIAGNOSIS — E269 Hyperaldosteronism, unspecified: Secondary | ICD-10-CM | POA: Diagnosis present

## 2013-07-09 DIAGNOSIS — D3500 Benign neoplasm of unspecified adrenal gland: Principal | ICD-10-CM | POA: Diagnosis present

## 2013-07-09 DIAGNOSIS — D3502 Benign neoplasm of left adrenal gland: Secondary | ICD-10-CM

## 2013-07-09 DIAGNOSIS — E2749 Other adrenocortical insufficiency: Secondary | ICD-10-CM | POA: Diagnosis present

## 2013-07-09 DIAGNOSIS — F3289 Other specified depressive episodes: Secondary | ICD-10-CM | POA: Diagnosis present

## 2013-07-09 DIAGNOSIS — F32A Depression, unspecified: Secondary | ICD-10-CM | POA: Diagnosis present

## 2013-07-09 DIAGNOSIS — M329 Systemic lupus erythematosus, unspecified: Secondary | ICD-10-CM | POA: Diagnosis present

## 2013-07-09 DIAGNOSIS — F259 Schizoaffective disorder, unspecified: Secondary | ICD-10-CM | POA: Diagnosis present

## 2013-07-09 DIAGNOSIS — K929 Disease of digestive system, unspecified: Secondary | ICD-10-CM | POA: Diagnosis not present

## 2013-07-09 DIAGNOSIS — Z6837 Body mass index (BMI) 37.0-37.9, adult: Secondary | ICD-10-CM

## 2013-07-09 DIAGNOSIS — I10 Essential (primary) hypertension: Secondary | ICD-10-CM | POA: Diagnosis present

## 2013-07-09 DIAGNOSIS — M129 Arthropathy, unspecified: Secondary | ICD-10-CM | POA: Diagnosis present

## 2013-07-09 DIAGNOSIS — R894 Abnormal immunological findings in specimens from other organs, systems and tissues: Secondary | ICD-10-CM | POA: Diagnosis present

## 2013-07-09 DIAGNOSIS — H15009 Unspecified scleritis, unspecified eye: Secondary | ICD-10-CM | POA: Diagnosis present

## 2013-07-09 DIAGNOSIS — Z01812 Encounter for preprocedural laboratory examination: Secondary | ICD-10-CM

## 2013-07-09 DIAGNOSIS — F329 Major depressive disorder, single episode, unspecified: Secondary | ICD-10-CM | POA: Diagnosis present

## 2013-07-09 DIAGNOSIS — K560 Paralytic ileus: Secondary | ICD-10-CM | POA: Diagnosis not present

## 2013-07-09 DIAGNOSIS — K567 Ileus, unspecified: Secondary | ICD-10-CM | POA: Diagnosis present

## 2013-07-09 DIAGNOSIS — Z7982 Long term (current) use of aspirin: Secondary | ICD-10-CM

## 2013-07-09 DIAGNOSIS — Z8249 Family history of ischemic heart disease and other diseases of the circulatory system: Secondary | ICD-10-CM

## 2013-07-09 DIAGNOSIS — R011 Cardiac murmur, unspecified: Secondary | ICD-10-CM | POA: Diagnosis present

## 2013-07-09 DIAGNOSIS — Z888 Allergy status to other drugs, medicaments and biological substances status: Secondary | ICD-10-CM

## 2013-07-09 DIAGNOSIS — E66813 Obesity, class 3: Secondary | ICD-10-CM

## 2013-07-09 DIAGNOSIS — T884XXA Failed or difficult intubation, initial encounter: Secondary | ICD-10-CM

## 2013-07-09 DIAGNOSIS — Z79899 Other long term (current) drug therapy: Secondary | ICD-10-CM

## 2013-07-09 DIAGNOSIS — R339 Retention of urine, unspecified: Secondary | ICD-10-CM | POA: Diagnosis not present

## 2013-07-09 DIAGNOSIS — M359 Systemic involvement of connective tissue, unspecified: Secondary | ICD-10-CM | POA: Diagnosis present

## 2013-07-09 DIAGNOSIS — E871 Hypo-osmolality and hyponatremia: Secondary | ICD-10-CM | POA: Diagnosis not present

## 2013-07-09 DIAGNOSIS — E876 Hypokalemia: Secondary | ICD-10-CM | POA: Diagnosis present

## 2013-07-09 DIAGNOSIS — D6859 Other primary thrombophilia: Secondary | ICD-10-CM | POA: Diagnosis present

## 2013-07-09 DIAGNOSIS — Z6841 Body Mass Index (BMI) 40.0 and over, adult: Secondary | ICD-10-CM

## 2013-07-09 DIAGNOSIS — H409 Unspecified glaucoma: Secondary | ICD-10-CM | POA: Diagnosis present

## 2013-07-09 DIAGNOSIS — F429 Obsessive-compulsive disorder, unspecified: Secondary | ICD-10-CM | POA: Diagnosis present

## 2013-07-09 DIAGNOSIS — Z86718 Personal history of other venous thrombosis and embolism: Secondary | ICD-10-CM

## 2013-07-09 HISTORY — DX: Failed or difficult intubation, initial encounter: T88.4XXA

## 2013-07-09 HISTORY — PX: LAPAROSCOPIC ADRENALECTOMY: SHX999

## 2013-07-09 LAB — CBC
HCT: 39.6 % (ref 36.0–46.0)
HEMATOCRIT: 39.9 % (ref 36.0–46.0)
Hemoglobin: 14.1 g/dL (ref 12.0–15.0)
Hemoglobin: 13.7 g/dL (ref 12.0–15.0)
MCH: 34.5 pg — ABNORMAL HIGH (ref 26.0–34.0)
MCH: 34.3 pg — ABNORMAL HIGH (ref 26.0–34.0)
MCHC: 35.3 g/dL (ref 30.0–36.0)
MCHC: 34.6 g/dL (ref 30.0–36.0)
MCV: 97.6 fL (ref 78.0–100.0)
MCV: 99.2 fL (ref 78.0–100.0)
Platelets: 185 10*3/uL (ref 150–400)
Platelets: 169 10*3/uL (ref 150–400)
RBC: 4.09 MIL/uL (ref 3.87–5.11)
RBC: 3.99 MIL/uL (ref 3.87–5.11)
RDW: 12.9 % (ref 11.5–15.5)
RDW: 13.1 % (ref 11.5–15.5)
WBC: 6.5 10*3/uL (ref 4.0–10.5)
WBC: 8.1 10*3/uL (ref 4.0–10.5)

## 2013-07-09 LAB — CREATININE, SERUM
CREATININE: 1.04 mg/dL (ref 0.50–1.10)
GFR, EST AFRICAN AMERICAN: 70 mL/min — AB (ref >90)
GFR, EST NON AFRICAN AMERICAN: 60 mL/min — AB (ref >90)

## 2013-07-09 LAB — TYPE AND SCREEN
ABO/RH(D): B POS
ANTIBODY SCREEN: NEGATIVE

## 2013-07-09 SURGERY — ADRENALECTOMY, LAPAROSCOPIC
Anesthesia: General | Laterality: Left

## 2013-07-09 MED ORDER — ENOXAPARIN SODIUM 40 MG/0.4ML ~~LOC~~ SOLN
40.0000 mg | SUBCUTANEOUS | Status: DC
  Administered 2013-07-10 – 2013-07-16 (×7): 40 mg via SUBCUTANEOUS
  Filled 2013-07-09 (×7): qty 0.4

## 2013-07-09 MED ORDER — DEXAMETHASONE SODIUM PHOSPHATE 10 MG/ML IJ SOLN
INTRAMUSCULAR | Status: AC
  Filled 2013-07-09: qty 1

## 2013-07-09 MED ORDER — HYDROCODONE-ACETAMINOPHEN 5-325 MG PO TABS
1.0000 | ORAL_TABLET | ORAL | Status: DC | PRN
  Administered 2013-07-10 – 2013-07-14 (×3): 2 via ORAL
  Filled 2013-07-09 (×3): qty 2

## 2013-07-09 MED ORDER — MIDAZOLAM HCL 5 MG/5ML IJ SOLN
INTRAMUSCULAR | Status: DC | PRN
  Administered 2013-07-09: 2 mg via INTRAVENOUS

## 2013-07-09 MED ORDER — PROPOFOL 10 MG/ML IV BOLUS
INTRAVENOUS | Status: DC | PRN
  Administered 2013-07-09: 100 mg via INTRAVENOUS
  Administered 2013-07-09 (×3): 50 mg via INTRAVENOUS
  Administered 2013-07-09: 200 mg via INTRAVENOUS
  Administered 2013-07-09: 50 mg via INTRAVENOUS

## 2013-07-09 MED ORDER — LACTATED RINGERS IV SOLN
INTRAVENOUS | Status: DC | PRN
  Administered 2013-07-09: 1000 mL via INTRAVENOUS

## 2013-07-09 MED ORDER — SUFENTANIL CITRATE 50 MCG/ML IV SOLN
INTRAVENOUS | Status: DC | PRN
  Administered 2013-07-09: 10 ug via INTRAVENOUS
  Administered 2013-07-09: 20 ug via INTRAVENOUS
  Administered 2013-07-09: 10 ug via INTRAVENOUS

## 2013-07-09 MED ORDER — SUFENTANIL CITRATE 50 MCG/ML IV SOLN
INTRAVENOUS | Status: AC
  Filled 2013-07-09: qty 1

## 2013-07-09 MED ORDER — HYDROMORPHONE HCL PF 1 MG/ML IJ SOLN
1.0000 mg | INTRAMUSCULAR | Status: DC | PRN
  Administered 2013-07-09 – 2013-07-14 (×22): 1 mg via INTRAVENOUS
  Filled 2013-07-09 (×23): qty 1

## 2013-07-09 MED ORDER — CISATRACURIUM BESYLATE 20 MG/10ML IV SOLN
INTRAVENOUS | Status: AC
  Filled 2013-07-09: qty 10

## 2013-07-09 MED ORDER — LEVOBUNOLOL HCL 0.5 % OP SOLN
1.0000 [drp] | Freq: Every morning | OPHTHALMIC | Status: DC
  Administered 2013-07-10 – 2013-07-12 (×3): 1 [drp] via OPHTHALMIC

## 2013-07-09 MED ORDER — LACTATED RINGERS IV SOLN: INTRAVENOUS | Status: DC

## 2013-07-09 MED ORDER — SUCCINYLCHOLINE CHLORIDE 20 MG/ML IJ SOLN
INTRAMUSCULAR | Status: DC | PRN
  Administered 2013-07-09: 40 mg via INTRAVENOUS
  Administered 2013-07-09: 60 mg via INTRAVENOUS
  Administered 2013-07-09: 100 mg via INTRAVENOUS

## 2013-07-09 MED ORDER — NIFEDIPINE ER 30 MG PO TB24
30.0000 mg | ORAL_TABLET | Freq: Every day | ORAL | Status: DC
  Administered 2013-07-10 – 2013-07-11 (×2): 30 mg via ORAL
  Filled 2013-07-09 (×2): qty 1

## 2013-07-09 MED ORDER — PROPOFOL 10 MG/ML IV BOLUS
INTRAVENOUS | Status: AC
  Filled 2013-07-09: qty 20

## 2013-07-09 MED ORDER — BUPROPION HCL ER (XL) 300 MG PO TB24
300.0000 mg | ORAL_TABLET | Freq: Every morning | ORAL | Status: DC
  Administered 2013-07-10 – 2013-07-16 (×7): 300 mg via ORAL
  Filled 2013-07-09 (×7): qty 1

## 2013-07-09 MED ORDER — GLYCOPYRROLATE 0.2 MG/ML IJ SOLN
INTRAMUSCULAR | Status: AC
  Filled 2013-07-09: qty 3

## 2013-07-09 MED ORDER — DEXAMETHASONE SODIUM PHOSPHATE 10 MG/ML IJ SOLN
INTRAMUSCULAR | Status: DC | PRN
  Administered 2013-07-09: 10 mg via INTRAVENOUS

## 2013-07-09 MED ORDER — ONDANSETRON HCL 4 MG/2ML IJ SOLN
4.0000 mg | Freq: Four times a day (QID) | INTRAMUSCULAR | Status: DC | PRN
Start: 2013-07-09 — End: 2013-07-16
  Administered 2013-07-10: 4 mg via INTRAVENOUS
  Filled 2013-07-09: qty 2

## 2013-07-09 MED ORDER — HYDRALAZINE HCL 20 MG/ML IJ SOLN
10.0000 mg | INTRAMUSCULAR | Status: DC | PRN
  Filled 2013-07-09: qty 0.5

## 2013-07-09 MED ORDER — AMLODIPINE BESYLATE 5 MG PO TABS: 5.0000 mg | ORAL_TABLET | Freq: Every morning | ORAL | Status: DC

## 2013-07-09 MED ORDER — LIDOCAINE HCL (CARDIAC) 20 MG/ML IV SOLN
INTRAVENOUS | Status: AC
  Filled 2013-07-09: qty 5

## 2013-07-09 MED ORDER — HYDROMORPHONE HCL PF 1 MG/ML IJ SOLN
0.2500 mg | INTRAMUSCULAR | Status: DC | PRN
  Administered 2013-07-09 (×4): 0.5 mg via INTRAVENOUS

## 2013-07-09 MED ORDER — ONDANSETRON HCL 4 MG/2ML IJ SOLN
INTRAMUSCULAR | Status: AC
  Filled 2013-07-09: qty 2

## 2013-07-09 MED ORDER — DULOXETINE HCL 60 MG PO CPEP
60.0000 mg | ORAL_CAPSULE | Freq: Every morning | ORAL | Status: DC
  Administered 2013-07-10 – 2013-07-16 (×7): 60 mg via ORAL
  Filled 2013-07-09 (×7): qty 1

## 2013-07-09 MED ORDER — LIDOCAINE HCL (CARDIAC) 20 MG/ML IV SOLN
INTRAVENOUS | Status: DC | PRN
  Administered 2013-07-09: 100 mg via INTRAVENOUS

## 2013-07-09 MED ORDER — ONDANSETRON HCL 4 MG PO TABS: 4.0000 mg | ORAL_TABLET | Freq: Four times a day (QID) | ORAL | Status: DC | PRN

## 2013-07-09 MED ORDER — PROMETHAZINE HCL 25 MG/ML IJ SOLN: 6.2500 mg | INTRAMUSCULAR | Status: DC | PRN

## 2013-07-09 MED ORDER — BUPIVACAINE-EPINEPHRINE 0.25% -1:200000 IJ SOLN
INTRAMUSCULAR | Status: AC
  Filled 2013-07-09: qty 1

## 2013-07-09 MED ORDER — KCL IN DEXTROSE-NACL 30-5-0.45 MEQ/L-%-% IV SOLN
INTRAVENOUS | Status: DC
  Administered 2013-07-09 – 2013-07-10 (×3): via INTRAVENOUS
  Filled 2013-07-09 (×5): qty 1000

## 2013-07-09 MED ORDER — GLYCOPYRROLATE 0.2 MG/ML IJ SOLN
INTRAMUSCULAR | Status: DC | PRN
  Administered 2013-07-09: .6 mg via INTRAVENOUS

## 2013-07-09 MED ORDER — VERAPAMIL HCL ER 240 MG PO TBCR
240.0000 mg | EXTENDED_RELEASE_TABLET | Freq: Every morning | ORAL | Status: DC
Start: 2013-07-09 — End: 2013-07-09

## 2013-07-09 MED ORDER — NIFEDIPINE ER 60 MG PO TB24: 120.0000 mg | ORAL_TABLET | Freq: Every morning | ORAL | Status: DC

## 2013-07-09 MED ORDER — CISATRACURIUM BESYLATE (PF) 10 MG/5ML IV SOLN
INTRAVENOUS | Status: DC | PRN
  Administered 2013-07-09: 1 mg via INTRAVENOUS
  Administered 2013-07-09 (×3): 2 mg via INTRAVENOUS
  Administered 2013-07-09: 12 mg via INTRAVENOUS

## 2013-07-09 MED ORDER — PANTOPRAZOLE SODIUM 40 MG PO TBEC
40.0000 mg | DELAYED_RELEASE_TABLET | Freq: Every day | ORAL | Status: DC
  Administered 2013-07-10 – 2013-07-16 (×7): 40 mg via ORAL
  Filled 2013-07-09 (×7): qty 1

## 2013-07-09 MED ORDER — ACETAMINOPHEN 325 MG PO TABS: 650.0000 mg | ORAL_TABLET | ORAL | Status: DC | PRN

## 2013-07-09 MED ORDER — QUETIAPINE FUMARATE 400 MG PO TABS
800.0000 mg | ORAL_TABLET | Freq: Every day | ORAL | Status: DC
  Administered 2013-07-10 – 2013-07-15 (×7): 800 mg via ORAL
  Filled 2013-07-09 (×8): qty 2

## 2013-07-09 MED ORDER — MIDAZOLAM HCL 2 MG/2ML IJ SOLN
INTRAMUSCULAR | Status: AC
  Filled 2013-07-09: qty 2

## 2013-07-09 MED ORDER — TISSEEL VH 10 ML EX KIT
PACK | CUTANEOUS | Status: AC
  Filled 2013-07-09: qty 1

## 2013-07-09 MED ORDER — HYDROMORPHONE HCL PF 1 MG/ML IJ SOLN
INTRAMUSCULAR | Status: AC
  Administered 2013-07-09: 1 mg
  Filled 2013-07-09: qty 1

## 2013-07-09 MED ORDER — LACTATED RINGERS IV SOLN
INTRAVENOUS | Status: DC
  Administered 2013-07-09: 10:00:00 via INTRAVENOUS
  Administered 2013-07-09: 1000 mL via INTRAVENOUS

## 2013-07-09 MED ORDER — HYDROMORPHONE HCL PF 1 MG/ML IJ SOLN
INTRAMUSCULAR | Status: AC
  Filled 2013-07-09: qty 1

## 2013-07-09 MED ORDER — ONDANSETRON HCL 4 MG/2ML IJ SOLN
INTRAMUSCULAR | Status: DC | PRN
  Administered 2013-07-09: 4 mg via INTRAVENOUS

## 2013-07-09 MED ORDER — BUPIVACAINE-EPINEPHRINE 0.25% -1:200000 IJ SOLN
INTRAMUSCULAR | Status: DC | PRN
  Administered 2013-07-09: 50 mL

## 2013-07-09 MED ORDER — NEOSTIGMINE METHYLSULFATE 1 MG/ML IJ SOLN
INTRAMUSCULAR | Status: DC | PRN
  Administered 2013-07-09: 4 mg via INTRAVENOUS

## 2013-07-09 MED ORDER — NEOSTIGMINE METHYLSULFATE 1 MG/ML IJ SOLN
INTRAMUSCULAR | Status: AC
  Filled 2013-07-09: qty 10

## 2013-07-09 MED ORDER — VERAPAMIL HCL ER 180 MG PO TBCR
180.0000 mg | EXTENDED_RELEASE_TABLET | Freq: Every day | ORAL | Status: DC
  Administered 2013-07-10 – 2013-07-16 (×7): 180 mg via ORAL
  Filled 2013-07-09 (×7): qty 1

## 2013-07-09 SURGICAL SUPPLY — 51 items
APPLIER CLIP ROT 10 11.4 M/L (STAPLE) ×2
APR CLP MED LRG 11.4X10 (STAPLE) ×1
BAG SPEC RTRVL LRG 6X4 10 (ENDOMECHANICALS) ×1
BLADE HEX COATED 2.75 (ELECTRODE) IMPLANT
CLAMP ENDO BABCK 10MM (STAPLE) IMPLANT
CLIP APPLIE ROT 10 11.4 M/L (STAPLE) IMPLANT
CONNECTOR 5 IN 1 STRAIGHT STRL (MISCELLANEOUS) ×2 IMPLANT
DECANTER SPIKE VIAL GLASS SM (MISCELLANEOUS) ×2 IMPLANT
DEVICE TROCAR PUNCTURE CLOSURE (ENDOMECHANICALS) ×1 IMPLANT
DISSECTOR BLUNT TIP ENDO 5MM (MISCELLANEOUS) ×2 IMPLANT
DRAIN CHANNEL RND F F (WOUND CARE) IMPLANT
DRAPE LAPAROSCOPIC ABDOMINAL (DRAPES) ×2 IMPLANT
DRAPE WARM FLUID 44X44 (DRAPE) ×2 IMPLANT
EVACUATOR SILICONE 100CC (DRAIN) IMPLANT
GLOVE BIOGEL PI IND STRL 7.0 (GLOVE) ×1 IMPLANT
GLOVE BIOGEL PI INDICATOR 7.0 (GLOVE) ×4
GLOVE SURG ORTHO 8.0 STRL STRW (GLOVE) ×4 IMPLANT
GOWN STRL REUS W/TWL LRG LVL3 (GOWN DISPOSABLE) ×1 IMPLANT
GOWN STRL REUS W/TWL XL LVL3 (GOWN DISPOSABLE) ×6 IMPLANT
HOLDER FOLEY CATH W/STRAP (MISCELLANEOUS) ×1 IMPLANT
KIT BASIN OR (CUSTOM PROCEDURE TRAY) ×2 IMPLANT
NS IRRIG 1000ML POUR BTL (IV SOLUTION) ×2 IMPLANT
PENCIL BUTTON HOLSTER BLD 10FT (ELECTRODE) IMPLANT
POUCH ENDO CATCH II 15MM (MISCELLANEOUS) IMPLANT
POUCH SPECIMEN RETRIEVAL 10MM (ENDOMECHANICALS) ×1 IMPLANT
RELOAD BLUE (STAPLE) IMPLANT
RELOAD WHITE ECR60W (STAPLE) IMPLANT
SCALPEL HARMONIC ACE (MISCELLANEOUS) ×2 IMPLANT
SCISSORS LAP 5X35 DISP (ENDOMECHANICALS) ×2 IMPLANT
SET IRRIG TUBING LAPAROSCOPIC (IRRIGATION / IRRIGATOR) ×2 IMPLANT
SOLUTION ANTI FOG 6CC (MISCELLANEOUS) ×2 IMPLANT
SPONGE LAP 18X18 X RAY DECT (DISPOSABLE) IMPLANT
STAPLER VISISTAT 35W (STAPLE) ×2 IMPLANT
SUT ETHILON 3 0 PS 1 (SUTURE) IMPLANT
SUT MNCRL AB 4-0 PS2 18 (SUTURE) ×2 IMPLANT
SYS LAPSCP GELPORT 120MM (MISCELLANEOUS)
SYSTEM LAPSCP GELPORT 120MM (MISCELLANEOUS) IMPLANT
TAPE CLOTH 4X10 WHT NS (GAUZE/BANDAGES/DRESSINGS) ×2 IMPLANT
TOWEL OR 17X26 10 PK STRL BLUE (TOWEL DISPOSABLE) ×2 IMPLANT
TRAY FOLEY CATH 14FRSI W/METER (CATHETERS) ×2 IMPLANT
TRAY LAP CHOLE (CUSTOM PROCEDURE TRAY) ×2 IMPLANT
TROCAR BLADELESS OPT 5 75 (ENDOMECHANICALS) ×2 IMPLANT
TROCAR XCEL 12X100 BLDLESS (ENDOMECHANICALS) IMPLANT
TROCAR XCEL BLUNT TIP 100MML (ENDOMECHANICALS) IMPLANT
TROCAR XCEL NON-BLD 11X100MML (ENDOMECHANICALS) ×1 IMPLANT
TROCAR XCEL NON-BLD 5MMX100MML (ENDOMECHANICALS) ×1 IMPLANT
TROCAR XCEL UNIV SLVE 11M 100M (ENDOMECHANICALS) ×3 IMPLANT
TUBING CONNECTING 10 (TUBING) ×2 IMPLANT
TUBING FILTER THERMOFLATOR (ELECTROSURGICAL) ×2 IMPLANT
WATER STERILE IRR 1500ML POUR (IV SOLUTION) ×1 IMPLANT
YANKAUER SUCT BULB TIP 10FT TU (MISCELLANEOUS) IMPLANT

## 2013-07-09 NOTE — Progress Notes (Signed)
Spoke with pt regarding bipap.  Pt stated she wants to watch some more tv and is not ready for bed at this time.  Pt asked that I come back after 1am to setup bipap.  RN aware, at bedside.  RT will follow-up with pt at that time.

## 2013-07-09 NOTE — Transfer of Care (Signed)
Immediate Anesthesia Transfer of Care Note  Patient: Jeanne [name]  Procedure(s) Performed: Procedure(s): LAPAROSCOPIC LEFT  ADRENALECTOMY (Left)  Patient Location: PACU  Anesthesia Type:General  Level of Consciousness: awake  Airway & Oxygen Therapy: Patient Spontanous Breathing and Patient connected to face mask oxygen  Post-op Assessment: Report given to PACU RN and Post -op Vital signs reviewed and stable  Post vital signs: Reviewed and stable  Complications: No apparent anesthesia complications

## 2013-07-09 NOTE — Interval H&P Note (Signed)
History and Physical Interval Note:  07/09/2013 7:17 AM  Jeanne [name]  has presented today for surgery, with the diagnosis of hyperaldosteronism.  The various methods of treatment have been discussed with the patient and family. After consideration of risks, benefits and other options for treatment, the patient has consented to    Procedure(s): LAPAROSCOPIC LEFT  ADRENALECTOMY (Left) as a surgical intervention .    The patient's history has been reviewed, patient examined, no change in status, stable for surgery.  I have reviewed the patient's chart and labs.  Questions were answered to the patient's satisfaction.    Earnstine Regal, MD, [facility] Office: [place]

## 2013-07-09 NOTE — Progress Notes (Signed)
IM Physician Consult Note  Subjective: Pt is resting comfortably in PACU w/ normal vitals on supplemental O2 at this time . I have been consulted to provide a medical regimen for her HTN at discharge   Objective: Vital signs in last 24 hours: Temp:  [98 F (36.7 C)-98.4 F (36.9 C)] 98.4 F (36.9 C) (03/23 1215) Pulse Rate:  [75-77] 75 (03/23 1215) Resp:  [18] 18 (03/23 1215) BP: (133-137)/(71-72) 137/72 mmHg (03/23 1215) SpO2:  [98 %-99 %] 98 % (03/23 1215) Weight change:     CBG (last 3)  No results found for this basename: GLUCAP,  in the last 72 hours  Intake/Output from previous day:  Intake/Output Summary (Last 24 hours) at 07/09/13 1258 Last data filed at 07/09/13 1219  Gross per 24 hour  Intake   1600 ml  Output    225 ml  Net   1375 ml      Physical Exam General appearance: AAF in NAD  Eyes: no scleral icterus Throat: oropharynx moist without erythema Resp: CTAB, no wheezing/rales  Cardio: RRR, no MRG  GI: soft, non-tender; bowel sounds normal; no masses,  no organomegaly Extremities: no clubbing, cyanosis or edema   Lab Results: No results found for this basename: NA, K, CL, CO2, GLUCOSE, BUN, CREATININE, CALCIUM, MG, PHOS,  in the last 72 hours  No results found for this basename: AST, ALT, ALKPHOS, BILITOT, PROT, ALBUMIN,  in the last 72 hours   Recent Labs  07/09/13 0700  WBC 6.5  HGB 14.1  HCT 39.9  MCV 97.6  PLT 185    Lab Results  Component Value Date   INR 1.02 07/06/2013   INR 1.06 05/21/2013   INR 1.11 12/05/2012    No results found for this basename: CKTOTAL, CKMB, CKMBINDEX, TROPONINI,  in the last 72 hours  No results found for this basename: TSH, T4TOTAL, FREET3, T3FREE, THYROIDAB,  in the last 72 hours  No results found for this basename: VITAMINB12, FOLATE, FERRITIN, TIBC, IRON, RETICCTPCT,  in the last 72 hours  Micro Results: No results found for this or any previous visit (from the past 240  hour(s)).  Studies/Results: No results found.   Medications: Scheduled: . HYDROmorphone      . HYDROmorphone       Continuous: . lactated ringers    . 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 She is now s/p L adrenalectomy in the setting of cohn's. We will decrease her HTN regimen as below. In addition, we will follow her up in my clinic later this week to ensure the new regimen is controlling her appropriately and make any further adjustments on an outpt basis. My office will contact her w/ appt details. I again thank Dr Harlow Asa for his care of Ms Niue.   New HTN regimen for discharge. I have entered the following meds to start in the AM, and all of these medications will require a new Rx at discharge:   1) discontinue her norvasc 5mg   2) Decrease her verapamil CR 240mg  to 180mg  tabs, once daily - send new Rx  3) discontinue her potassium chloride 41mEq BID  4) Decrease her procardia XL 60mg  to 30mg  tabs, once daily - send new Rx  5) discontinue her amiloride 10mg  daily   If you have any further questions, please contact my office.  LOS: 0 days   Draco Malczewski 07/09/2013, 12:58 PM

## 2013-07-09 NOTE — Anesthesia Preprocedure Evaluation (Addendum)
Anesthesia Evaluation  Patient identified by MRN, date of birth, ID band Patient awake    Reviewed: Allergy & Precautions, H&P , NPO status , Patient's Chart, lab work & pertinent test results  Airway Mallampati: III TM Distance: >3 FB Neck ROM: Full    Dental  (+) Missing,    Pulmonary neg pulmonary ROS, sleep apnea and Continuous Positive Airway Pressure Ventilation , Current Smoker,  breath sounds clear to auscultation  Pulmonary exam normal       Cardiovascular hypertension, Pt. on medications DVT negative cardio ROS  Rhythm:Regular Rate:Normal     Neuro/Psych PSYCHIATRIC DISORDERS Depression negative neurological ROS  negative psych ROS   GI/Hepatic negative GI ROS, Neg liver ROS,   Endo/Other  Morbid obesity  Renal/GU negative Renal ROS  negative genitourinary   Musculoskeletal negative musculoskeletal ROS (+)   Abdominal   Peds negative pediatric ROS (+)  Hematology negative hematology ROS (+)   Anesthesia Other Findings Glaucoma  Reproductive/Obstetrics negative OB ROS                        Anesthesia Physical Anesthesia Plan  ASA: III  Anesthesia Plan: General   Post-op Pain Management:    Induction: Intravenous  Airway Management Planned: Oral ETT  Additional Equipment:   Intra-op Plan:   Post-operative Plan: Extubation in OR  Informed Consent: I have reviewed the patients History and Physical, chart, labs and discussed the procedure including the risks, benefits and alternatives for the proposed anesthesia with the patient or authorized representative who has indicated his/her understanding and acceptance.   Dental advisory given  Plan Discussed with: CRNA  Anesthesia Plan Comments:       Anesthesia Quick Evaluation

## 2013-07-09 NOTE — Anesthesia Postprocedure Evaluation (Signed)
Anesthesia Post Note  Patient: Jeanne [name]  Procedure(s) Performed: Procedure(s) (LRB): LAPAROSCOPIC LEFT  ADRENALECTOMY (Left)  Anesthesia type: General  Patient location: PACU  Post pain: Pain level controlled  Post assessment: Post-op Vital signs reviewed  Last Vitals:  Filed Vitals:   07/09/13 1528  BP: 147/79  Pulse: 80  Temp: 36.8 C  Resp: 18    Post vital signs: Reviewed  Level of consciousness: sedated  Complications: No apparent anesthesia complications

## 2013-07-09 NOTE — Preoperative (Signed)
Beta Blockers   Reason not to administer Beta Blockers:Not Applicable 

## 2013-07-09 NOTE — Brief Op Note (Signed)
07/09/2013  12:05 PM  PATIENT:  Jeanne [name]  53 y.o. female  PRE-OPERATIVE DIAGNOSIS:  left adrenal adenoma, hyperaldosteronism  POST-OPERATIVE DIAGNOSIS:  same  PROCEDURE:  Procedure(s): LAPAROSCOPIC LEFT  ADRENALECTOMY (Left)  SURGEON:  Earnstine Regal, MD, FACS  ASSISTANT:  Leighton Ruff, MD   ANESTHESIA:   general  EBL:  Total I/O In: 1000 [I.V.:1000] Out: 200 [Urine:200]  BLOOD ADMINISTERED:none  DRAINS: none   LOCAL MEDICATIONS USED:  MARCAINE     SPECIMEN:  Excision  DISPOSITION OF SPECIMEN:  PATHOLOGY  COUNTS:  YES  TOURNIQUET:  * No tourniquets in log *  DICTATION: .Other Dictation: Dictation Number [id number]  PLAN OF CARE: Admit for overnight observation  PATIENT DISPOSITION:  PACU - hemodynamically stable.   Delay start of Pharmacological VTE agent (>24hrs) due to surgical blood loss or risk of bleeding: yes  Earnstine Regal, MD, [facility] Office: [phone]

## 2013-07-09 NOTE — Anesthesia Procedure Notes (Signed)
Procedure Name: Intubation Date/Time: 07/09/2013 9:09 AM Performed by: Danley Danker L Patient Re-evaluated:Patient Re-evaluated prior to inductionOxygen Delivery Method: Circle system utilized Preoxygenation: Pre-oxygenation with 100% oxygen Intubation Type: IV induction Ventilation: Mask ventilation without difficulty and Oral airway inserted - appropriate to patient size Grade View: Grade IV Tube type: Oral Tube size: 7.5 mm Number of attempts: 5 or more Airway Equipment and Method: Video-laryngoscopy Placement Confirmation: ETT inserted through vocal cords under direct vision,  positive ETCO2 and breath sounds checked- equal and bilateral Secured at: 21 cm Tube secured with: Tape Dental Injury: Bloody posterior oropharynx, Teeth and Oropharynx as per pre-operative assessment and Injury to lip  Difficulty Due To: Difficulty was unanticipated, Difficult Airway- due to reduced neck mobility and Difficult Airway- due to anterior larynx Future Recommendations: Recommend- induction with short-acting agent, and alternative techniques readily available Comments: Attempted oral intubation per CRNA x2 with Sabra Heck 2.  Unable to visualize cords.  Dr. Winfred Leeds attempted with Sabra Heck 3 and head cushion removed without success.  LMA 4.0 placed and  Glidescope sent for and on arrival, 3.0 blade placed on glidescope.  Difficult view but airway secured on 3rd attempt.

## 2013-07-09 NOTE — H&P (View-Only) (Signed)
General Surgery [facility]  Chief Complaint  Patient presents with  . New Evaluation    left adrenal adenoma, hyperaldosteronism - referral from Dr. Carrolyn Meiers    HISTORY: Patient is a 53 year old female referred by her endocrinologist for evaluation of newly identified left adrenal adenoma. Patient had been hospitalized in November 2014 with hypokalemia. Evaluation included a CT scan of the abdomen and pelvis on 03/06/2013. This showed a 2.1 cm left adrenal adenoma. Subsequent selective venous sampling was performed. This showed lateralization of elevated aldosterone level and cortisol level to the left adrenal vein. Patient is now referred for consideration for left adrenalectomy for management of hyperaldosteronism.  Patient has had no prior abdominal surgery. She has undergone a conization procedure by gynecology.  There is no family history of endocrine neoplasms.  Past Medical History  Diagnosis Date  . Hypertension   . Glaucoma   . Gastritis   . Schizo-affective psychosis   . Depression   . DVT (deep venous thrombosis)   . Heart murmur   . Hypokalemia 03/05/2013  . Neuromuscular disorder     " NEUROPATHY IN  MY HANDS"  . Arthritis   . Sleep apnea     Bipap  . Scleritis of both eyes   . Lupus anticoagulant disorder   . Conn disease   . Clotting disorder     Current Outpatient Prescriptions  Medication Sig Dispense Refill  . aMILoride (MIDAMOR) 5 MG tablet Take 2 tabs by mouth twice daily  120 tablet  2  . amLODipine (NORVASC) 5 MG tablet Take 5 mg by mouth daily.      [name] aspirin EC 81 MG tablet Take 162 mg by mouth daily.      [name] buPROPion (WELLBUTRIN XL) 300 MG 24 hr tablet Take 300 mg by mouth daily.      . celecoxib (CELEBREX) 200 MG capsule Take 200 mg by mouth daily.      . DULoxetine (CYMBALTA) 60 MG capsule Take 60 mg by mouth daily.      [name] esomeprazole (NEXIUM) 40 MG capsule Take 40 mg by mouth daily before breakfast.      . folic acid  (FOLVITE) 1 MG tablet Take 1 mg by mouth daily.      [name] levobunolol (BETAGAN) 0.5 % ophthalmic solution Place 1 drop into both eyes at bedtime.      . methotrexate (RHEUMATREX) 2.5 MG tablet Take 10 mg by mouth every 7 (seven) days. Mondays      . NIFEdipine (PROCARDIA XL) 60 MG 24 hr tablet Take 120 mg by mouth daily.      . potassium chloride SA (K-DUR,KLOR-CON) 20 MEQ tablet Take 2 tablets by mouth twice dialy  120 tablet  2  . QUEtiapine (SEROQUEL) 400 MG tablet Take 400 mg by mouth 2 (two) times daily.      . verapamil (CALAN-SR) 240 MG CR tablet Take 240 mg by mouth daily.        No current facility-administered medications for this visit.    Allergies  Allergen Reactions  . Ace Inhibitors Anaphylaxis  . Travatan [Travoprost] Other (See Comments)    Red eyes, irritation     Family History  Problem Relation Age of Onset  . Cancer Maternal Aunt     breast ca  . Heart attack Maternal Grandmother 54  . Heart attack Maternal Grandfather 62  . Hypertension Sister   . Hypertension Sister     History   Social History  .  Marital Status: Single    Spouse Name: N/A    Number of Children: 0  . Years of Education: college   Occupational History  . Retired    Social History Main Topics  . Smoking status: Current Every Day Smoker -- 0.50 packs/day for 25 years    Types: Cigarettes  . Smokeless tobacco: Never Used  . Alcohol Use: Yes     Comment: " drink just on weekends "  . Drug Use: No  . Sexual Activity: None   Other Topics Concern  . None   Social History Narrative  . None    REVIEW OF SYSTEMS - PERTINENT POSITIVES ONLY: Hypertension. Hypokalemia. No abdominal pain. No prior abdominal surgery.  EXAM: Filed Vitals:   07/03/13 0909  BP: 130/86  Pulse: 76  Temp: 97.4 F (36.3 C)  Resp: 14    GENERAL: well-developed, well-nourished, no acute distress HEENT: normocephalic; pupils equal and reactive; sclerae clear; dentition good; mucous membranes  moist NECK:  No palpable nodules in the thyroid bed; symmetric on extension; no palpable anterior or posterior cervical lymphadenopathy; no supraclavicular masses; no tenderness CHEST: clear to auscultation bilaterally without rales, rhonchi, or wheezes CARDIAC: regular rate and rhythm without significant murmur; peripheral pulses are full ABDOMEN: soft without distension; bowel sounds present; no mass; no hepatosplenomegaly; no hernia EXT:  non-tender without edema; no deformity NEURO: no gross focal deficits; no sign of tremor   LABORATORY RESULTS: See [facility] (CHL-Epic) for most recent results  RADIOLOGY RESULTS: See [facility] (CHL-Epic) for most recent results  IMPRESSION: #1 hyperaldosteronism with left adrenal adenoma, 2.1 cm #2 hypertension #3 hypokalemia #4 lupus anticoagulant on aspirin therapy  PLAN: Patient and I discussed the above findings at length. We reviewed her CT scan and her selective venous sampling. I have recommended left adrenalectomy which should be possible by laparoscopic technique. We discussed the procedure, the potential complications including conversion to open surgery and possible need for splenectomy. We discussed the hospital stay to be anticipated and her recovery and return to activity. She understands and wishes to proceed in the near future.  The risks and benefits of the procedure have been discussed at length with the patient.  The patient understands the proposed procedure, potential alternative treatments, and the course of recovery to be expected.  All of the patient's questions have been answered at this time.  The patient wishes to proceed with surgery.  Earnstine Regal, MD, [facility]  Primary Care Physician: Velna Hatchet, MD

## 2013-07-10 ENCOUNTER — Encounter (HOSPITAL_COMMUNITY): Payer: Self-pay | Admitting: Surgery

## 2013-07-10 LAB — BASIC METABOLIC PANEL
BUN: 14 mg/dL (ref 6–23)
CALCIUM: 9.4 mg/dL (ref 8.4–10.5)
CO2: 24 mEq/L (ref 19–32)
CREATININE: 0.9 mg/dL (ref 0.50–1.10)
Chloride: 96 mEq/L (ref 96–112)
GFR, EST AFRICAN AMERICAN: 83 mL/min — AB (ref >90)
GFR, EST NON AFRICAN AMERICAN: 72 mL/min — AB (ref >90)
Glucose, Bld: 128 mg/dL — ABNORMAL HIGH (ref 70–99)
Potassium: 4.2 mEq/L (ref 3.7–5.3)
Sodium: 134 mEq/L — ABNORMAL LOW (ref 137–147)

## 2013-07-10 LAB — GLUCOSE, CAPILLARY: Glucose-Capillary: 135 mg/dL — ABNORMAL HIGH (ref 70–99)

## 2013-07-10 LAB — CBC
HEMATOCRIT: 42.9 % (ref 36.0–46.0)
Hemoglobin: 15.2 g/dL — ABNORMAL HIGH (ref 12.0–15.0)
MCH: 34.7 pg — ABNORMAL HIGH (ref 26.0–34.0)
MCHC: 35.4 g/dL (ref 30.0–36.0)
MCV: 97.9 fL (ref 78.0–100.0)
Platelets: 187 10*3/uL (ref 150–400)
RBC: 4.38 MIL/uL (ref 3.87–5.11)
RDW: 12.9 % (ref 11.5–15.5)
WBC: 12.2 10*3/uL — AB (ref 4.0–10.5)

## 2013-07-10 MED ORDER — BISACODYL 5 MG PO TBEC: 5.0000 mg | DELAYED_RELEASE_TABLET | Freq: Every day | ORAL | Status: DC | PRN

## 2013-07-10 MED ORDER — BISACODYL 10 MG RE SUPP
10.0000 mg | Freq: Once | RECTAL | Status: AC
  Administered 2013-07-10: 10 mg via RECTAL
  Filled 2013-07-10: qty 1

## 2013-07-10 MED ORDER — SODIUM CHLORIDE 0.9 % IV BOLUS (SEPSIS)
500.0000 mL | Freq: Once | INTRAVENOUS | Status: AC
  Administered 2013-07-10: 500 mL via INTRAVENOUS

## 2013-07-10 NOTE — Progress Notes (Signed)
Reassessed patient, patient now sleeping peaceful Neta Mends RN 07-10-2013 18:11pm

## 2013-07-10 NOTE — Progress Notes (Signed)
Telephone order from [place] to infuse NS bolus 500 ml once and then increase current IV fluid rate to 125 ml/hr and order to in and put cath patient every 6 hours as needed Neta Mends RN 07-10-2013 19:03pm

## 2013-07-10 NOTE — Progress Notes (Signed)
Patient has not voided since foley catheter removal

## 2013-07-10 NOTE — Progress Notes (Signed)
Pt complains foley cath causing discomfort. Saline removed from balloon cath advanced 10cc saline replaced into bulb, foley draining straw colored urine.  Pain continued in left flank radiating to mid abdomen, abdomen is slightly more distended, bowel sounds active, denies nausea, no flatus.  Patient has ambulating x3 times through night shift and in chair greater than 2 hours.  Will pass on current information to day shift to continue to evaluate patient for changes.

## 2013-07-10 NOTE — Progress Notes (Signed)
UR completed 

## 2013-07-10 NOTE — Progress Notes (Signed)
Patient is alert and oriented and vital signs are stable. Patient ambulated in hall way several times and tolerated well. Patient still continues to complain of abdominal pain. Patient had a large bowel movement with some flatus. Patient's urinary catheter was removed and patient has still not voided. Patient suddenly started sweating profusely but vital signs and CBG were within normal limits. MD contacted. Will continue to monitor patient. Marlinda Mike (student nurse) 07/10/2013. 18:30pm

## 2013-07-10 NOTE — Progress Notes (Signed)
Patient awoke stating her pain is getting worse and she felt like MD needed to be called.  Explained to patient that she had not had medication for 4 hours as she had been sleeping.  Abdomen reevaluated, no change in distention, abd soft tender over left flank that radiates across the abdomen toward umbilicus.  This too is same pain described by patient throughout this nurses shift. Bowel sounds active, patient has burped but not passed gas.  I encouraged patient to walk in the hallway and administered IV Dilaudid.  Patient ambulated then helped to bed. Foley catheter in place draining golden yellow urine at quantity sufficient.

## 2013-07-10 NOTE — Op Note (Signed)
NAME:  Jeanne [name], Jeanne [name]            ACCOUNT NO.:  [id number]  MEDICAL RECORD NO.:  [id number]  LOCATION:  1517                         FACILITY:  [facility]  PHYSICIAN:  Earnstine Regal, MD      DATE OF BIRTH:  22-Jan-1961  DATE OF PROCEDURE:  07/09/2013                               OPERATIVE REPORT   PREOPERATIVE DIAGNOSIS:  Left adrenal adenoma, hypoaldosteronism.  POSTOPERATIVE DIAGNOSIS:  Left adrenal adenoma, hypoaldosteronism.  PROCEDURE:  Laparoscopic left adrenalectomy.  SURGEON:  Earnstine Regal, MD, FACS  ASSISTANT:  Leighton Ruff, MD  ANESTHESIA:  General per Dr. Freddie Apley.  ESTIMATED BLOOD LOSS:  Minimal.  PREPARATION:  ChloraPrep.  COMPLICATIONS:  None.  INDICATIONS:  The patient is a 53 year old female referred by her endocrinologist for left adrenalectomy for a newly identified left adrenal adenoma.  The patient had been found in November 2014 with hypokalemia.  Evaluation included a CT scan of the abdomen and pelvis on March 06, 2013.  This showed a 2.1 cm left adrenal adenoma. Subsequent selective venous sampling was performed and showed lateralization of elevated aldosterone level and cortisol level to the left adrenal vein.  The patient now comes to surgery for left adrenalectomy for management of hyperaldosteronism.  BODY OF REPORT:  Procedures done in OR #11 at the [facility].  The patient was brought to the operating room and placed in supine position on the operating room table.  Following induction of general anesthesia, the patient was turned to a right lateral decubitus position, the table was flexed, and the bean bag was inflated.  After properly padding and positioning the patient, the patient was prepped and draped in the usual aseptic fashion.  After ascertaining that an adequate level of anesthesia had been achieved, an incision was made in the anterior axillary line at the left costal margin.  Using a 5-mm Optiview  trocar, the camera was advanced directly into the peritoneal cavity and pneumoperitoneum was established.  Further operative reports were placed along the left costal margin in the midclavicular line, the posterior axillary line, and the left flank.  These trocars were advanced under direct vision.  Using the Harmonic Scalpel, the splenic flexure of the colon was mobilized partially.  The peritoneum lateral to the spleen was then incised and this incision was carried to the diaphragm and medially to the esophageal hiatus.  This allows the spleen to be gently mobilized medially and inferiorly, exposing the anterior left kidney and the medial aspect of the superior pole of the kidney.  The tail of the pancreas was identified.  Gerota's fascia was opened with the Harmonic scalpel.  Adrenal gland was identified.  The medial and inferior aspects of the adrenal are dissected out using the Harmonic scalpel for hemostasis. The adrenal vein was identified.  It was doubly clipped proximally and distally, and divided sharply with the scissors.  The adrenal gland was then mobilized and excised using the Harmonic scalpel.  There was a substantial medial arterial branch which was divided between Ligaclips with the Harmonic scalpel.  Remainder of the diaphragmatic attachments of the adrenal gland were divided with the Harmonic Scalpel and the adrenal was separated completely.  Adrenal was  placed into an EndoCatch bag and withdrawn through the 11-mm trocar site.  The site was dilated slightly with a Kelly clamp and the entire specimen was removed from the peritoneal cavity.  Left upper quadrant was inspected closely for hemostasis.  The adrenal bed was irrigated with warm saline which was evacuated.  Good hemostasis was noted.  The 11-mm trocar site was then closed with a 0 Vicryl using the EndoCatch, then passed a suture through the abdominal wall.  The suture was tied securely, closing this port  site.  Ports were removed under direct vision and good hemostasis was noted at all port sites. Pneumoperitoneum was released.  Port sites were anesthetized with local anesthetic.  Skin edges were reapproximated with interrupted 4-0 Monocryl subcuticular sutures.  Wounds were washed and dried and benzoin and Steri-Strips were applied.  Sterile dressings were applied.  Specimen was opened on the back table.  The adrenal gland was incised with a #15 blade.  There was normal appearing adrenal tissue and then there is a 2 cm nodular tumor which is cadmium yellow in appearance consistent with aldosteronoma.  The entire specimen was submitted to Pathology for review.  The patient was awakened from anesthesia and transported to the recovery room.  The patient tolerated the procedure well.   Earnstine Regal, MD, [facility] Office: [phone]   TMG/MEDQ  D:  07/09/2013  T:  07/10/2013  Job:  [id number]  cc:   Annie Main A. Forde Dandy, M.D. Fax: [phone]  Velna Hatchet, MD

## 2013-07-10 NOTE — Progress Notes (Signed)
Notified MD Rosenbower that patient is generally not feeling well, sweating profusely, cbg checked within normal limits, vital signs are stable, order for stat CBC from physican and order entered, supplemental oxygen applied as ordered Neta Mends RN 07-10-2013 17:30pm

## 2013-07-10 NOTE — Progress Notes (Signed)
Patient ID: Jeanne [name], female   DOB: [birth year], 52 y.o.   MRN: [id number]  [facility] - Progress Note  POD# 1  Subjective: Patient up in chair.  Complains of gas pains.  No nausea or emesis.  On clear liquid diet.  Objective: Vital signs in last 24 hours: Temp:  [97.5 F (36.4 C)-99.2 F (37.3 C)] 99.2 F (37.3 C) (03/24 0549) Pulse Rate:  [64-87] 87 (03/24 0549) Resp:  [13-20] 18 (03/24 0549) BP: (137-169)/(22-94) 149/94 mmHg (03/24 0549) SpO2:  [93 %-100 %] 93 % (03/24 0549) Weight:  [265 lb (120.203 kg)] 265 lb (120.203 kg) (03/23 1330) Last BM Date: 07/06/13  Intake/Output from previous day: 03/23 0701 - 03/24 0700 In: 3450 [P.O.:600; I.V.:2850] Out: 5150 [Urine:5125; Blood:25]  Exam: HEENT - clear, not icteric Neck - soft Chest - clear bilaterally Cor - RRR, no murmur Abd - soft, moderate distension; dressings dry and intact Ext - no significant edema Neuro - grossly intact, no focal deficits  Lab Results:   Recent Labs  07/09/13 0700 07/09/13 1430  WBC 6.5 8.1  HGB 14.1 13.7  HCT 39.9 39.6  PLT 185 169     Recent Labs  07/09/13 1430 07/10/13 0434  NA  --  134*  K  --  4.2  CL  --  96  CO2  --  24  GLUCOSE  --  128*  BUN  --  14  CREATININE 1.04 0.90  CALCIUM  --  9.4    Studies/Results: No results found.  Assessment / Plan: 1.  Status post lap left adrenalectomy  Encourage OOB, ambulation today  Advance to regular diet  Appreciate Dr. Ardeth Perfect management of HTN post op  Discontinue Foley catheter  Earnstine Regal, MD, [facility] Office: [phone]  07/10/2013

## 2013-07-10 NOTE — Progress Notes (Signed)
Telephone order from MD Gerkin for dulcolax suppository once today, prder entered Neta Mends RN 07-10-2013 13:42pm

## 2013-07-10 NOTE — Progress Notes (Signed)
Patient with complaints of gas pain, patient up and ambulated around the nursing station about four times, patient belching and tolerating diet no nausea but just feeling bloated, wants something ordered for gas pain, MD notified Neta Mends RN 07-10-2013 13:35pm

## 2013-07-11 ENCOUNTER — Observation Stay (HOSPITAL_COMMUNITY): Payer: Medicare Other

## 2013-07-11 DIAGNOSIS — K567 Ileus, unspecified: Secondary | ICD-10-CM | POA: Diagnosis present

## 2013-07-11 LAB — CBC WITH DIFFERENTIAL/PLATELET
BASOS ABS: 0 10*3/uL (ref 0.0–0.1)
BASOS PCT: 0 % (ref 0–1)
EOS ABS: 0 10*3/uL (ref 0.0–0.7)
Eosinophils Relative: 0 % (ref 0–5)
HCT: 39.5 % (ref 36.0–46.0)
HEMOGLOBIN: 13.8 g/dL (ref 12.0–15.0)
Lymphocytes Relative: 7 % — ABNORMAL LOW (ref 12–46)
Lymphs Abs: 0.9 10*3/uL (ref 0.7–4.0)
MCH: 34.3 pg — AB (ref 26.0–34.0)
MCHC: 34.9 g/dL (ref 30.0–36.0)
MCV: 98.3 fL (ref 78.0–100.0)
MONOS PCT: 14 % — AB (ref 3–12)
Monocytes Absolute: 1.7 10*3/uL — ABNORMAL HIGH (ref 0.1–1.0)
Neutro Abs: 9.7 10*3/uL — ABNORMAL HIGH (ref 1.7–7.7)
Neutrophils Relative %: 79 % — ABNORMAL HIGH (ref 43–77)
Platelets: 164 10*3/uL (ref 150–400)
RBC: 4.02 MIL/uL (ref 3.87–5.11)
RDW: 13 % (ref 11.5–15.5)
WBC: 12.3 10*3/uL — ABNORMAL HIGH (ref 4.0–10.5)

## 2013-07-11 LAB — BASIC METABOLIC PANEL
BUN: 23 mg/dL (ref 6–23)
CO2: 21 mEq/L (ref 19–32)
CREATININE: 1.44 mg/dL — AB (ref 0.50–1.10)
Calcium: 9.1 mg/dL (ref 8.4–10.5)
Chloride: 92 mEq/L — ABNORMAL LOW (ref 96–112)
GFR, EST AFRICAN AMERICAN: 47 mL/min — AB (ref >90)
GFR, EST NON AFRICAN AMERICAN: 41 mL/min — AB (ref >90)
Glucose, Bld: 126 mg/dL — ABNORMAL HIGH (ref 70–99)
Potassium: 4.4 mEq/L (ref 3.7–5.3)
Sodium: 129 mEq/L — ABNORMAL LOW (ref 137–147)

## 2013-07-11 MED ORDER — NIFEDIPINE ER 60 MG PO TB24
60.0000 mg | ORAL_TABLET | Freq: Every day | ORAL | Status: DC
  Administered 2013-07-12 – 2013-07-16 (×5): 60 mg via ORAL
  Filled 2013-07-11 (×5): qty 1

## 2013-07-11 MED ORDER — KCL IN DEXTROSE-NACL 20-5-0.9 MEQ/L-%-% IV SOLN
INTRAVENOUS | Status: DC
  Administered 2013-07-11 – 2013-07-14 (×8): via INTRAVENOUS
  Filled 2013-07-11 (×9): qty 1000

## 2013-07-11 MED ORDER — SODIUM CHLORIDE 0.9 % IV BOLUS (SEPSIS)
1000.0000 mL | Freq: Once | INTRAVENOUS | Status: AC
  Administered 2013-07-11: 1000 mL via INTRAVENOUS

## 2013-07-11 NOTE — Progress Notes (Signed)
Patient is alert and oriented and vital signs are stable. Patient still complains of severe abdominal pains. Patient is distended and has not had a bowel movement or flatus. Patient has been voiding well, patient ambulated independently in hallway around nursing station and tolerated well. Patient tolerated regular diet with no complaints of nausea or vomiting. Patient's X-ray of abdomen showed a bowel gas pattern consistent with a diffuse ileus. Will continue to monitor patient. Marlinda Mike (student nurse) 03/25/205. 17:45 pm.

## 2013-07-11 NOTE — Progress Notes (Signed)
UR Completed.  Jeanne [name] Jane [phone] 07/11/2013  

## 2013-07-11 NOTE — Plan of Care (Signed)
Re: med changes for discharge .  I had noted that she was on procardia 60 at home, but she states she was on 120 daily prior to admission  I had intended to cut dose by 50% from prior to admission.   Please d/c home on procardia 60mg  daily. I have made the med change while she is currently admitted , starting w/ tomorrow dose.   Velna Hatchet, MD 07/11/2013 6:01 PM

## 2013-07-11 NOTE — Clinical Documentation Improvement (Signed)
[facility] MD's, PA's and NP's  Noted NA 129 on admit patient given NS 500 ml IV bolus x2 with NA level at 134 on repeat BMET. If either condition listed below is appropriate please document in notes. Thank you  Possible Clinical Conditions?                      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

## 2013-07-11 NOTE — Progress Notes (Signed)
Patient ID: Jeanne [name], female   DOB: 1960/06/04, 53 y.o.   MRN: [id number]  [facility] - Progress Note  POD# 2  Subjective: Patient up in chair wearing CPAP.  Complains of abdominal pain, distension.  Unable to eat very much but not nauseated.  Had BM with suppository, but not passing flatus.  Complains she is unable to void, but 300cc with I&O cath at 3 AM.  Objective: Vital signs in last 24 hours: Temp:  [98.2 F (36.8 C)-99 F (37.2 C)] 99 F (37.2 C) (03/25 0621) Pulse Rate:  [86-99] 97 (03/25 0621) Resp:  [16-20] 18 (03/25 0621) BP: (136-180)/(89-114) 143/89 mmHg (03/25 0621) SpO2:  [90 %-93 %] 92 % (03/25 0621) Last BM Date: 07/09/13  Intake/Output from previous day: 03/24 0701 - 03/25 0700 In: 2682.9 [P.O.:720; I.V.:1962.9] Out: 1150 [Urine:1150]  Exam: HEENT - clear, not icteric Neck - soft Chest - clear bilaterally Cor - RRR, no murmur Abd - moderate distension, BS present; dressings dry and intact Ext - no significant edema Neuro - grossly intact, no focal deficits  Lab Results:   Recent Labs  07/09/13 1430 07/10/13 1757  WBC 8.1 12.2*  HGB 13.7 15.2*  HCT 39.6 42.9  PLT 169 187     Recent Labs  07/10/13 0434 07/11/13 0430  NA 134* 129*  K 4.2 4.4  CL 96 92*  CO2 24 21  GLUCOSE 128* 126*  BUN 14 23  CREATININE 0.90 1.44*  CALCIUM 9.4 9.1    Studies/Results: No results found.  Assessment / Plan: 1.  Status post left adrenalectomy for Conn's tumor  Pain control with narcotics  Encourage OOB, ambulation  Appreciate medical service guidance for BP control 2.  Post op ileus?  Increase IVF, bolus  Change to NS IV  Check AXR today  Check CBC today 3.  Urinary retention  I&O cath if needed  May need urology consult?  Earnstine Regal, MD, [facility] Office: [phone]  07/11/2013

## 2013-07-12 MED ORDER — LEVOBUNOLOL HCL 0.5 % OP SOLN
1.0000 [drp] | Freq: Every morning | OPHTHALMIC | Status: DC
  Administered 2013-07-13 – 2013-07-16 (×4): 1 [drp] via OPHTHALMIC
  Filled 2013-07-12: qty 5

## 2013-07-12 MED ORDER — BISACODYL 10 MG RE SUPP
10.0000 mg | Freq: Every day | RECTAL | Status: DC | PRN
  Administered 2013-07-12 – 2013-07-13 (×2): 10 mg via RECTAL
  Filled 2013-07-12 (×2): qty 1

## 2013-07-12 NOTE — Progress Notes (Signed)
Patient ID: Jeanne [name], female   DOB: July 30, 1960, 53 y.o.   MRN: [id number]  [facility] - Progress Note  POD# 3  Subjective: Patient ambulating in halls.  No nausea, but doesn't want to eat.  Complains of abdominal pain and tightness. Passing some flatus overnight and this AM.  No BM.  Objective: Vital signs in last 24 hours: Temp:  [99.1 F (37.3 C)-99.7 F (37.6 C)] 99.7 F (37.6 C) (03/26 0545) Pulse Rate:  [81-94] 94 (03/26 0545) Resp:  [16-18] 16 (03/26 0545) BP: (135-146)/(81-83) 136/81 mmHg (03/26 0545) SpO2:  [91 %-94 %] 93 % (03/26 0545) Last BM Date: 07/10/13  Intake/Output from previous day: 07-29-22 0701 - 03/26 0700 In: 3480 [P.O.:480; I.V.:3000] Out: 3025 [Urine:3025]  Exam: HEENT - clear, not icteric Neck - soft Chest - clear bilaterally Cor - RRR, no murmur Abd - tense, distended; diffuse mild tenderness; BS present; dressings dry and intact Ext - no significant edema Neuro - grossly intact, no focal deficits  Lab Results:   Recent Labs  07/10/13 1757 28-Jul-2013 0815  WBC 12.2* 12.3*  HGB 15.2* 13.8  HCT 42.9 39.5  PLT 187 164     Recent Labs  07/10/13 0434 07-28-13 0430  NA 134* 129*  K 4.2 4.4  CL 96 92*  CO2 24 21  GLUCOSE 128* 126*  BUN 14 23  CREATININE 0.90 1.44*  CALCIUM 9.4 9.1    Studies/Results: Dg Abd 2 Views  2013/07/28   CLINICAL DATA:  Postoperative from laparoscopic adrenalectomy 2 days ago now with diffuse abdominal pain and possible ileus  EXAM: ABDOMEN - 2 VIEW  COMPARISON:  None.  FINDINGS: There are numerous loops of gas-filled mildly distended small bowel over the mid and lower abdomen. There is gaseous distention of colonic loops as well. There is a moderate amount of stool in the right colon and there is some stool in the region of the rectum. No free extraluminal gas collections are demonstrated today. There are surgical clips to the left of the lower thoracic spine. There is  increased density at both lung bases with small air bronchograms.  IMPRESSION: 1. The bowel gas pattern is consistent with a diffuse ileus. Nasogastric suction may be of value. 2. Atelectasis versus early pneumonia is present at both lung bases.   Electronically Signed   By: David  Martinique   On: Jul 28, 2013 09:46    Assessment / Plan: 1. Status post left adrenalectomy for Conn's tumor   Encourage OOB, ambulation   Appreciate medical service guidance for BP control  2. Post op ileus  Continue IVF  Change to clear liquid diet - discussed with patient  AXR shows diffuse ileus  Discussed NG tube with patient - will hold off for now and place if nausea and emesis develops  Check CBC, BMET in AM 3. Urinary retention   Improved, voiding  Continue to monitor output   Earnstine Regal, MD, [facility] Office: [phone]  07/12/2013

## 2013-07-13 LAB — BASIC METABOLIC PANEL
BUN: 5 mg/dL — ABNORMAL LOW (ref 6–23)
CO2: 28 mEq/L (ref 19–32)
Calcium: 9.4 mg/dL (ref 8.4–10.5)
Chloride: 102 mEq/L (ref 96–112)
Creatinine, Ser: 0.64 mg/dL (ref 0.50–1.10)
GFR calc Af Amer: >90 mL/min (ref >90)
Glucose, Bld: 126 mg/dL — ABNORMAL HIGH (ref 70–99)
Potassium: 4.1 mEq/L (ref 3.7–5.3)
SODIUM: 141 meq/L (ref 137–147)

## 2013-07-13 LAB — CBC
HCT: 36.7 % (ref 36.0–46.0)
Hemoglobin: 12.8 g/dL (ref 12.0–15.0)
MCH: 34.3 pg — AB (ref 26.0–34.0)
MCHC: 34.9 g/dL (ref 30.0–36.0)
MCV: 98.4 fL (ref 78.0–100.0)
PLATELETS: 182 10*3/uL (ref 150–400)
RBC: 3.73 MIL/uL — AB (ref 3.87–5.11)
RDW: 13 % (ref 11.5–15.5)
WBC: 8 10*3/uL (ref 4.0–10.5)

## 2013-07-13 MED ORDER — HYDROCODONE-ACETAMINOPHEN 5-325 MG PO TABS: 1.0000 | ORAL_TABLET | ORAL | Status: DC | PRN

## 2013-07-13 NOTE — Progress Notes (Signed)
Patient ID: Jeanne [name], female   DOB: 1960-06-24, 53 y.o.   MRN: [id number]  [facility] - Progress Note  POD# 4  Subjective: Patient up at bedside.  Still complaining of abdominal distension and discomfort.  No nausea or emesis.  Taking po clear liquids.  Voiding.  Ambulating.  Some flatus, no BM.  Objective: Vital signs in last 24 hours: Temp:  [98.2 F (36.8 C)-98.9 F (37.2 C)] 98.2 F (36.8 C) (03/27 0511) Pulse Rate:  [79-99] 82 (03/27 0511) Resp:  [16-22] 18 (03/27 0511) BP: (142-165)/(80-91) 154/80 mmHg (03/27 0511) SpO2:  [90 %-95 %] 92 % (03/27 0511) Last BM Date: 07/10/13  Intake/Output from previous day: 03/26 0701 - 03/27 0700 In: 2720 [P.O.:720; I.V.:2000] Out: 8200 [Urine:8200]  Exam: HEENT - clear, not icteric Neck - soft Chest - clear bilaterally Cor - RRR, no murmur Abd - slightly softer, less distended; mild diffuse tenderness; few BS present; dressings dry and intact Ext - no significant edema Neuro - grossly intact, no focal deficits  Lab Results:   Recent Labs  07/11/13 0815 07/13/13 0432  WBC 12.3* 8.0  HGB 13.8 12.8  HCT 39.5 36.7  PLT 164 182     Recent Labs  07/11/13 0430 07/13/13 0432  NA 129* 141  K 4.4 4.1  CL 92* 102  CO2 21 28  GLUCOSE 126* 126*  BUN 23 5*  CREATININE 1.44* 0.64  CALCIUM 9.1 9.4    Studies/Results: No results found.  Assessment / Plan: 1.  Status post lap adrenalectomy  Hypokalemia resolved  Hyponatremia resolved  HTN improved - appreciate Dr. Hoover Brunette help in managing Rx 2.  Post op ileus  Some improvement  Decrease IVF  Continue clear liquid diet until better GI function  Earnstine Regal, MD, [facility] Office: [phone]  07/13/2013

## 2013-07-13 NOTE — Discharge Instructions (Signed)
  [facility]  LAPAROSCOPIC SURGERY - POST-OP INSTRUCTIONS  Always review your discharge instruction sheet given to you by the facility where your surgery was performed.  A prescription for pain medication may be given to you upon discharge.  Take your pain medication as prescribed.  If narcotic pain medicine is not needed, then you may take acetaminophen (Tylenol) or ibuprofen (Advil) as needed.  Take your usually prescribed medications unless otherwise directed.  If you need a refill on your pain medication, please contact your pharmacy.  They will contact our office to request authorization. Prescriptions will not be filled after 5 P.M. or on weekends.  You should follow a light diet the first few days after arrival home, such as soup and crackers or toast.  Be sure to include plenty of fluids daily.  Most patients will experience some swelling and bruising in the area of the incisions.  Ice packs will help.  Swelling and bruising can take several days to resolve.   It is common to experience some constipation if taking pain medication after surgery.  Increasing fluid intake and taking a stool softener (such as Colace) will usually help or prevent this problem from occurring.  A mild laxative (Milk of Magnesia or Miralax) should be taken according to package instructions if there are no bowel movements after 48 hours.  Unless discharge instructions indicate otherwise, you may remove your bandages 24-48 hours after surgery, and you may shower at that time.  You may have steri-strips (small skin tapes) in place directly over the incision.  These strips should be left on the skin for 7-10 days.  If your surgeon used skin glue on the incision, you may shower in 24 hours.  The glue will flake off over the next 2-3 weeks.  Any sutures or staples will be removed at the office during your follow-up visit.  ACTIVITIES:  You may resume regular (light) daily activities beginning the  next day-such as daily self-care, walking, climbing stairs-gradually increasing activities as tolerated.  You may have sexual intercourse when it is comfortable.  Refrain from any heavy lifting or straining until approved by your doctor.  You may drive when you are no longer taking prescription pain medication, you can comfortably wear a seatbelt, and you can safely maneuver your car and apply brakes.  You should see your doctor in the office for a follow-up appointment approximately 2-3 weeks after your surgery.  Make sure that you call for this appointment within a day or two after you arrive home to insure a convenient appointment time.  WHEN TO CALL YOUR DOCTOR: 1. Fever over 101.0 2. Inability to urinate 3. Continued bleeding from incision 4. Increased pain, redness, or drainage from the incision 5. Increasing abdominal pain  The clinic staff is available to answer your questions during regular business hours.  Please don't hesitate to call and ask to speak to one of the nurses for clinical concerns.  If you have a medical emergency, go to the nearest emergency room or call 911.  A surgeon from [facility] is always on call for the hospital.  Sharlie Shreffler M. Raeghan Demeter, MD, FACS [facility] Office: [phone] Toll Free:  [phone] FAX [phone]  Web site: [email or web address] 

## 2013-07-14 MED ORDER — MAGIC MOUTHWASH
15.0000 mL | Freq: Four times a day (QID) | ORAL | Status: DC | PRN
  Filled 2013-07-14: qty 15

## 2013-07-14 MED ORDER — SODIUM CHLORIDE 0.9 % IJ SOLN: 3.0000 mL | INTRAMUSCULAR | Status: DC | PRN

## 2013-07-14 MED ORDER — LACTATED RINGERS IV BOLUS (SEPSIS)
1000.0000 mL | Freq: Three times a day (TID) | INTRAVENOUS | Status: AC | PRN
Start: 2013-07-14 — End: 2013-07-16

## 2013-07-14 MED ORDER — METOPROLOL TARTRATE 1 MG/ML IV SOLN
5.0000 mg | Freq: Four times a day (QID) | INTRAVENOUS | Status: DC | PRN
  Filled 2013-07-14: qty 5

## 2013-07-14 MED ORDER — SODIUM CHLORIDE 0.9 % IJ SOLN
3.0000 mL | Freq: Two times a day (BID) | INTRAMUSCULAR | Status: DC
  Administered 2013-07-14 – 2013-07-15 (×3): 3 mL via INTRAVENOUS

## 2013-07-14 MED ORDER — LIP MEDEX EX OINT
1.0000 "application " | TOPICAL_OINTMENT | Freq: Two times a day (BID) | CUTANEOUS | Status: DC
  Administered 2013-07-15 – 2013-07-16 (×3): 1 via TOPICAL

## 2013-07-14 MED ORDER — CELECOXIB 200 MG PO CAPS
200.0000 mg | ORAL_CAPSULE | Freq: Two times a day (BID) | ORAL | Status: DC
  Administered 2013-07-14 – 2013-07-16 (×5): 200 mg via ORAL
  Filled 2013-07-14 (×6): qty 1

## 2013-07-14 MED ORDER — ASPIRIN EC 81 MG PO TBEC
162.0000 mg | DELAYED_RELEASE_TABLET | Freq: Every day | ORAL | Status: DC
  Administered 2013-07-14 – 2013-07-16 (×3): 162 mg via ORAL
  Filled 2013-07-14 (×3): qty 2

## 2013-07-14 MED ORDER — ALUM & MAG HYDROXIDE-SIMETH 200-200-20 MG/5ML PO SUSP
30.0000 mL | Freq: Four times a day (QID) | ORAL | Status: DC | PRN
Start: 2013-07-14 — End: 2013-07-16

## 2013-07-14 MED ORDER — SACCHAROMYCES BOULARDII 250 MG PO CAPS
250.0000 mg | ORAL_CAPSULE | Freq: Two times a day (BID) | ORAL | Status: DC
  Administered 2013-07-14 – 2013-07-16 (×5): 250 mg via ORAL
  Filled 2013-07-14 (×6): qty 1

## 2013-07-14 MED ORDER — METHOTREXATE 2.5 MG PO TABS
10.0000 mg | ORAL_TABLET | ORAL | Status: DC
  Administered 2013-07-16: 10 mg via ORAL
  Filled 2013-07-14: qty 4

## 2013-07-14 MED ORDER — OXYCODONE HCL 5 MG PO TABS
5.0000 mg | ORAL_TABLET | ORAL | Status: DC | PRN
  Administered 2013-07-14 – 2013-07-16 (×7): 10 mg via ORAL
  Filled 2013-07-14 (×8): qty 2

## 2013-07-14 MED ORDER — HYDROMORPHONE HCL PF 1 MG/ML IJ SOLN
0.5000 mg | INTRAMUSCULAR | Status: DC | PRN
  Administered 2013-07-15: 1 mg via INTRAVENOUS
  Filled 2013-07-14: qty 1

## 2013-07-14 MED ORDER — POLYETHYLENE GLYCOL 3350 17 G PO PACK
17.0000 g | PACK | Freq: Two times a day (BID) | ORAL | Status: DC
  Administered 2013-07-14 – 2013-07-15 (×3): 17 g via ORAL
  Filled 2013-07-14 (×6): qty 1

## 2013-07-14 MED ORDER — FOLIC ACID 1 MG PO TABS
1.0000 mg | ORAL_TABLET | Freq: Every day | ORAL | Status: DC
  Administered 2013-07-14 – 2013-07-16 (×3): 1 mg via ORAL
  Filled 2013-07-14 (×3): qty 1

## 2013-07-14 MED ORDER — CELECOXIB 200 MG PO CAPS
200.0000 mg | ORAL_CAPSULE | Freq: Every day | ORAL | Status: DC
  Filled 2013-07-14: qty 1

## 2013-07-14 MED ORDER — ACETAMINOPHEN 500 MG PO TABS
1000.0000 mg | ORAL_TABLET | Freq: Three times a day (TID) | ORAL | Status: DC
  Administered 2013-07-14 – 2013-07-16 (×6): 1000 mg via ORAL
  Filled 2013-07-14 (×8): qty 2

## 2013-07-14 MED ORDER — ADULT MULTIVITAMIN W/MINERALS CH
1.0000 | ORAL_TABLET | Freq: Every day | ORAL | Status: DC
  Administered 2013-07-14 – 2013-07-16 (×3): 1 via ORAL
  Filled 2013-07-14 (×3): qty 1

## 2013-07-14 NOTE — Progress Notes (Signed)
Pt already on BIPAP when RT stopped by to check on.  Pt stated that everything was fine and would call if she needed assistance with her machine.  RT to monitor and assess as needed.

## 2013-07-14 NOTE — Progress Notes (Signed)
[place], MD, [address] [place], [place] [address] Phone: [phone] FAX: Galena D [name] [phone] 12-01-1960  CARE TEAM:  PCP: Velna Hatchet, MD  Outpatient Care Team: Patient Care Team: Velna Hatchet, MD as PCP - General (Internal Medicine) Heath Lark, MD as Consulting Physician (Hematology and Oncology) Campbell Lerner, MD as Consulting Physician (Rheumatology)  Inpatient Treatment Team: Treatment Team: Attending Provider: Earnstine Regal, MD; Registered Nurse: Fulton Reek, [place]; Registered Nurse: Roma Schanz, RN; Registered Nurse: Emmie Niemann, RN; Registered Nurse: Celedonio Savage, RN   Subjective:  Sore but better.  Patient's mother and nurse in room.  Had bowel movement last night.  Woriied about her blood pressure.  Objective:  Vital signs:  Filed Vitals:   07/13/13 2010 07/13/13 2100 07/14/13 0600 07/14/13 1036  BP:  167/71 184/82 171/79  Pulse: 73 79 92   Temp:  98.9 F (37.2 C) 98.8 F (37.1 C)   TempSrc:  Oral Oral   Resp: _0 Height:      Weight:      SpO2: 94% 93% 95%     Last BM Date: 07/13/13  Intake/Output   Yesterday:  03/27 0701 - 03/28 0700 In: 2624.2 [P.O.:600; I.V.:2024.2] Out: 3251 [Urine:3250; Stool:1] This shift:  Total I/O In: 120 [P.O.:120] Out: 500 [Urine:500]  Bowel function:  Flatus: y  BM: y  Drain: n/a  Physical Exam:  General: Pt awake/alert/oriented x4 in no acute distress Eyes: PERRL, normal EOM.  Sclera clear.  No icterus Neuro: CN II-XII intact w/o focal sensory/motor deficits. Lymph: No head/neck/groin lymphadenopathy Psych:  No delerium/psychosis/paranoia HENT: Normocephalic, Mucus membranes moist.  No thrush Neck: Supple, No tracheal deviation Chest: No chest wall pain w good excursion CV:  Pulses intact.  Regular rhythm MS: Normal AROM mjr joints.  No obvious  deformity Abdomen: Soft.  Nondistended.  Mildly tender at incisions only.  No evidence of peritonitis.  No incarcerated hernias. Ext:  SCDs BLE.  No mjr edema.  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 [name]  5 Days Post-Op  Procedure(s): LAPAROSCOPIC LEFT  ADRENALECTOMY  Slowly improving  Plan:  -adv to fulls -bowel regimen -HTN control - lowering nifedipine.  Prn backup -Improve pain control.  Will try restarting Celebrex but follow blood pressure closely. -Continue medications for depression and obsessive compulsive disorder. -Restart vitamins her request.  Hold off on potassium per medicines request since adrenalectomy should help treat the Conn's syndrome -VTE prophylaxis- SCDs, etc -mobilize as tolerated to help recovery  I updated the patient's status to the patient.  Recommendations were made.  Questions were answered.  The patient expressed understanding & appreciation.   Adin Hector, M.D., F.A.C.S. Gastrointestinal and Minimally Invasive Surgery [facility] [address], [place], [place] [address] [phone] Main / Paging   07/14/2013   Results:   Labs: Results for orders placed during the hospital encounter of 07/09/13 (from the past 48 hour(s))  CBC     Status: Abnormal   Collection Time    07/13/13  4:32 AM      Result Value Ref Range   WBC 8.0  4.0 - 10.5 K/uL   RBC 3.73 (*) 3.87 - 5.11 MIL/uL   Hemoglobin 12.8  12.0 - 15.0 g/dL   HCT 36.7  36.0 - 46.0 %   MCV 98.4  78.0 - 100.0 fL   MCH 34.3 (*) 26.0 - 34.0 pg   MCHC  34.9  30.0 - 36.0 g/dL   RDW 13.0  11.5 - 15.5 %   Platelets 182  150 - 400 K/uL  BASIC METABOLIC PANEL     Status: Abnormal   Collection Time    07/13/13  4:32 AM      Result Value Ref Range   Sodium 141  137 - 147 mEq/L   Potassium 4.1  3.7 - 5.3 mEq/L   Chloride 102  96 - 112 mEq/L   CO2 28  19 - 32 mEq/L   Glucose, Bld 126 (*) 70 - 99 mg/dL   BUN 5 (*) 6 - 23 mg/dL   Creatinine, Ser 0.64  0.50 - 1.10 mg/dL   Calcium 9.4  8.4 - 10.5 mg/dL   GFR calc non Af Amer >90  >90 mL/min   GFR calc Af Amer >90  >90 mL/min   Comment: (NOTE)     The eGFR has been calculated using the CKD EPI equation.     This calculation has not been validated in all clinical situations.     eGFR's persistently <90 mL/min signify possible Chronic Kidney     Disease.    Imaging / Studies: No results found.  Medications / Allergies: per chart  Antibiotics: Anti-infectives   Start     Dose/Rate Route Frequency Ordered Stop   07/09/13 0600  ceFAZolin (ANCEF) 3 g in dextrose 5 % 50 mL IVPB     3 g 160 mL/hr over 30 Minutes Intravenous On call to O.R. 07/08/13 1804 07/09/13 0930       Note: This dictation was prepared with Dragon/digital dictation along with [employer]. Any transcriptional errors that result from this process are unintentional.

## 2013-07-15 NOTE — Progress Notes (Signed)
6 Days Post-Op  Subjective: More comfortable today, less anxious +BM's  Objective: Vital signs in last 24 hours: Temp:  [98.3 F (36.8 C)-99 F (37.2 C)] 98.3 F (36.8 C) (03/29 0814) Pulse Rate:  [58-93] 73 (03/29 0633) Resp:  [16-18] 18 (03/29 0633) BP: (145-171)/(79-90) 145/80 mmHg (03/29 0633) SpO2:  [93 %-96 %] 96 % (03/29 4818) Last BM Date: 07/14/13  Intake/Output from previous day: 03/28 0701 - 03/29 0700 In: 600 [P.O.:600] Out: 4250 [Urine:4250] Intake/Output this shift: Total I/O In: 240 [P.O.:240] Out: 550 [Urine:550]  Looks comfortable Abdomen soft, appropriately tender, non distended  Lab Results:   Recent Labs  07/13/13 0432  WBC 8.0  HGB 12.8  HCT 36.7  PLT 182   BMET  Recent Labs  07/13/13 0432  NA 141  K 4.1  CL 102  CO2 28  GLUCOSE 126*  BUN 5*  CREATININE 0.64  CALCIUM 9.4   PT/INR No results found for this basename: LABPROT, INR,  in the last 72 hours ABG No results found for this basename: PHART, PCO2, PO2, HCO3,  in the last 72 hours  Studies/Results: No results found.  Anti-infectives: Anti-infectives   Start     Dose/Rate Route Frequency Ordered Stop   07/09/13 0600  ceFAZolin (ANCEF) 3 g in dextrose 5 % 50 mL IVPB     3 g 160 mL/hr over 30 Minutes Intravenous On call to O.R. 07/08/13 1804 07/09/13 0930      Assessment/Plan: s/p Procedure(s): LAPAROSCOPIC LEFT  ADRENALECTOMY (Left)  Continuing current care Hopeful for discharge tomorrow  LOS: 6 days    Jeanne [name] A 07/15/2013

## 2013-07-15 NOTE — Progress Notes (Signed)
Pt already on BIPAP when RT rounded. Pt stated that she was fine and would call if she needed anything regarding the BIPAP.  RT to monitor and assess as needed.

## 2013-07-16 LAB — CREATININE, SERUM
CREATININE: 1.05 mg/dL (ref 0.50–1.10)
GFR, EST AFRICAN AMERICAN: 69 mL/min — AB (ref >90)
GFR, EST NON AFRICAN AMERICAN: 60 mL/min — AB (ref >90)

## 2013-07-16 MED ORDER — VERAPAMIL HCL ER 180 MG PO TBCR: 180.0000 mg | EXTENDED_RELEASE_TABLET | Freq: Every day | ORAL | Status: AC

## 2013-07-16 MED ORDER — OXYCODONE HCL 5 MG PO TABS: 5.0000 mg | ORAL_TABLET | ORAL | Status: DC | PRN

## 2013-07-16 MED ORDER — NIFEDIPINE ER 60 MG PO TB24
60.0000 mg | ORAL_TABLET | Freq: Every day | ORAL | Status: DC
Start: 2013-07-16 — End: 2021-07-15

## 2013-07-16 NOTE — Discharge Summary (Signed)
Physician Discharge Summary [facility]  Patient ID: Jeanne [name] MRN: [id number] DOB/AGE: 1960-09-26 53 y.o.  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  Post op course complicated by development of ileus.  Required IV hydration, restricted diet.  Gradual resolution with advancement of diet and return of bowel function.    Patient seen by primary MD during hospital stay and anti-hypertension regimen adjusted per Dr. Hoover Brunette recommendations.  Prepared for discharge home on POD#7.  Consults: Internal Medicine (Dr. Ardeth Perfect)  Significant Diagnostic Studies: AXR, labs  Treatments: surgery: laparoscopic left adrenalectomy  Discharge Exam: Blood pressure 153/84, pulse 57, temperature 98.3 F (36.8 C), temperature source Oral, resp. rate 18, height 5\' 11"  (1.803 m), weight 265 lb (120.203 kg), SpO2 97.00%. HEENT - clear Neck - soft, non-tender Chest - clear bilaterally Cor - RRR Abd - softer, less distended, BS present; incisions clear and dry with steri-strips in place  Disposition: Home with family  Discharge Orders   Future Appointments Provider Department Dept Phone   07/24/2013 11:00 AM Earnstine Regal, MD [facility], [place] [phone]   Future Orders Complete By Expires   Diet - low sodium heart healthy  As directed    Discharge instructions  As directed    Comments:     [facility]  LAPAROSCOPIC SURGERY - POST-OP INSTRUCTIONS  Always review your discharge instruction sheet given to you by the facility where your surgery was performed.  A prescription for pain medication may be given to you upon discharge.  Take your pain medication as prescribed.  If narcotic pain medicine is not needed, then you may take acetaminophen (Tylenol) or ibuprofen (Advil) as needed.  Take your usually prescribed medications unless otherwise directed.  If you need a refill on your pain medication, please contact your pharmacy.  They will contact our office to request authorization. Prescriptions will not be filled after 5 P.M. or on weekends.  You should follow a light diet the first few days after arrival home, such as soup and crackers or toast.  Be sure to include plenty of fluids daily.  Most patients will experience some swelling and bruising in the area of the incisions.  Ice packs will help.  Swelling and bruising can take several days to resolve.   It is common to experience some constipation if taking pain medication after surgery.  Increasing fluid intake and taking a stool softener (such as Colace) will usually help or prevent this problem from occurring.  A mild laxative (Milk of Magnesia or Miralax) should be taken according to package instructions if there are no bowel movements after 48 hours.  Unless discharge instructions indicate otherwise, you may remove your bandages 24-48 hours after surgery, and you may shower at that time.  You may have steri-strips (small skin tapes) in place directly over the incision.  These strips should be left on the skin for 7-10 days.  If your surgeon used skin glue on the incision, you may shower in 24 hours.  The  glue will flake off over the next 2-3 weeks.  Any sutures or staples will be removed at the office during your follow-up visit.  ACTIVITIES:  You may resume regular (light) daily activities beginning the next day-such as daily  self-care, walking, climbing stairs-gradually increasing activities as tolerated.  You may have sexual intercourse when it is comfortable.  Refrain from any heavy lifting or straining until approved by your doctor.  You may drive when you are no longer taking prescription pain medication, you can comfortably wear a seatbelt, and you can safely maneuver your car and apply brakes.  You should see your doctor in the office for a follow-up appointment approximately 2-3 weeks after your surgery.  Make sure that you call for this appointment within a day or two after you arrive home to insure a convenient appointment time.  WHEN TO CALL YOUR DOCTOR: Fever over 101.0 Inability to urinate Continued bleeding from incision Increased pain, redness, or drainage from the incision Increasing abdominal pain  The clinic staff is available to answer your questions during regular business hours.  Please don't hesitate to call and ask to speak to one of the nurses for clinical concerns.  If you have a medical emergency, go to the nearest emergency room or call 911.  A surgeon from [facility] is always on call for the hospital.  Earnstine Regal, MD, [facility] Office: [place] Free:  [phone] FAX [phone]  Web site: [email or web address]   Increase activity slowly  As directed        Medication List         aMILoride 5 MG tablet  Commonly known as:  MIDAMOR  Take 2 tabs by mouth twice daily     amLODipine 5 MG tablet  Commonly known as:  NORVASC  Take 5 mg by mouth every morning.     aspirin EC 81 MG tablet  Take 162 mg by mouth daily.     buPROPion 300 MG 24 hr tablet  Commonly known as:  WELLBUTRIN XL  Take 300 mg by mouth every morning.     celecoxib 200 MG capsule  Commonly known as:  CELEBREX  Take 200 mg by mouth daily.     DULoxetine 60 MG capsule  Commonly known as:  CYMBALTA  Take 60 mg by mouth every morning.      esomeprazole 40 MG capsule  Commonly known as:  NEXIUM  Take 40 mg by mouth daily before breakfast.     folic acid 1 MG tablet  Commonly known as:  FOLVITE  Take 1 mg by mouth daily.     HYDROcodone-acetaminophen 5-325 MG per tablet  Commonly known as:  NORCO/VICODIN  Take 1-2 tablets by mouth every 4 (four) hours as needed.     levobunolol 0.5 % ophthalmic solution  Commonly known as:  BETAGAN  Place 1 drop into both eyes every morning.     methotrexate 2.5 MG tablet  Commonly known as:  RHEUMATREX  Take 10 mg by mouth every 7 (seven) days. Mondays     multivitamin with minerals Tabs tablet  Take 1 tablet by mouth daily.     oxyCODONE 5 MG immediate release tablet  Commonly known as:  Oxy IR/ROXICODONE  Take 1-2 tablets (5-10 mg total) by mouth every 4 (four) hours as needed for moderate pain, severe pain or breakthrough pain.     potassium chloride SA 20 MEQ tablet  Commonly known as:  K-DUR,KLOR-CON  Take 2 tablets by mouth twice dialy     PROCARDIA XL 60 MG 24 hr tablet  Generic drug:  NIFEdipine  Take 120 mg by mouth every morning.     NIFEdipine 60 MG 24 hr tablet  Commonly known as:  PROCARDIA-XL/ADALAT CC  Take 1 tablet (60 mg total) by mouth daily.     SEROQUEL 400 MG tablet  Generic drug:  QUEtiapine  Take 800 mg by mouth at bedtime.     verapamil 180 MG CR tablet  Commonly known as:  CALAN-SR  Take 1 tablet (180 mg total) by mouth daily.           Follow-up Information   Follow up with Earnstine Regal, MD In 3 weeks. (For wound re-check)    Specialty:  General Surgery   Contact information:   [address] [place] [address] [phone]       Earnstine Regal, MD, [facility] Office: [phone]   Signed: Earnstine Regal 07/16/2013, 10:51 AM

## 2013-07-24 ENCOUNTER — Encounter (INDEPENDENT_AMBULATORY_CARE_PROVIDER_SITE_OTHER): Payer: Medicare Other | Admitting: Surgery

## 2013-07-30 ENCOUNTER — Telehealth (INDEPENDENT_AMBULATORY_CARE_PROVIDER_SITE_OTHER): Payer: Self-pay | Admitting: General Surgery

## 2013-07-30 NOTE — Telephone Encounter (Signed)
Pt called to ask if could move up appt / having some pain( level 5)  but had not taken pain meds yet today. She was trying to monitor her pain/  Spoke with dr Harlow Asa and he said keep appt Wednesday and try taking ibuprofen   And use heat pad/ pt instructed if gets worse to call back. She seemed satisfied  To try the ibuprofen and keep Wednesday appt//edie Clenton Esper 07-30-13

## 2013-07-31 ENCOUNTER — Encounter (INDEPENDENT_AMBULATORY_CARE_PROVIDER_SITE_OTHER): Payer: Self-pay

## 2013-08-01 ENCOUNTER — Encounter (INDEPENDENT_AMBULATORY_CARE_PROVIDER_SITE_OTHER): Payer: Self-pay | Admitting: Surgery

## 2013-08-01 ENCOUNTER — Ambulatory Visit (INDEPENDENT_AMBULATORY_CARE_PROVIDER_SITE_OTHER): Payer: Medicare Other | Admitting: Surgery

## 2013-08-01 VITALS — BP 155/80 | HR 91 | Temp 98.2°F | Resp 14 | Ht 71.0 in | Wt 255.8 lb

## 2013-08-01 DIAGNOSIS — R110 Nausea: Secondary | ICD-10-CM | POA: Insufficient documentation

## 2013-08-01 DIAGNOSIS — D3502 Benign neoplasm of left adrenal gland: Secondary | ICD-10-CM

## 2013-08-01 DIAGNOSIS — D3500 Benign neoplasm of unspecified adrenal gland: Secondary | ICD-10-CM

## 2013-08-01 MED ORDER — ONDANSETRON HCL 4 MG PO TABS: 4.0000 mg | ORAL_TABLET | Freq: Three times a day (TID) | ORAL | Status: DC | PRN

## 2013-08-01 NOTE — Patient Instructions (Signed)
  CARE OF INCISION   Apply cocoa butter/vitamin E cream (Palmer's brand) to your incision 2 - 3 times daily.  Massage cream into incision for one minute with each application.  Use sunscreen (50 SPF or higher) for first 6 months after surgery if area is exposed to sun.  You may alternate Mederma or other scar reducing cream with cocoa butter cream if desired.       Zarria Towell M. Aaliayah Miao, MD, FACS      [facility]      Office: [phone]    

## 2013-08-01 NOTE — Progress Notes (Signed)
General Surgery [facility]  Chief Complaint  Patient presents with  . Routine Post Op    left adrenalectomy 07/09/2013    HISTORY: The patient is a 53 year old female who underwent left laparoscopic adrenalectomy on 07/09/2013. She final pathology shows a 2 cm left adrenal cortical adenoma. Patient has seen her primary care physician postoperatively with normalization of her electrolytes. Her hypertensive medications are being adjusted currently.  Pain at the incision sites has improved dramatically with the addition of ibuprofen as needed.  Patient has experienced some morning nausea the past few weeks.  EXAM: Surgical incisions are well-healed. No sign of herniation. No sign of infection. Minimally tender.  IMPRESSION: Status post laparoscopic left adrenalectomy for adrenal cortical adenoma and hyperaldosteronism  PLAN: Patient is doing well. She will begin to apply topical creams to her incisions. Remaining soreness at the incision sites should resolve over the next few weeks. She will take ibuprofen as needed.  The patient complains of significant nausea in the morning. I am uncertain as to its etiology. I will give her a prescription for Zofran to see if this helps over the next few days.  Patient will continue follow-up with her primary care physician.  Patient will return for surgical care as needed.  Earnstine Regal, MD, [facility]   Visit Diagnoses: 1. 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 Nausea alone

## 2013-10-08 ENCOUNTER — Other Ambulatory Visit: Payer: Self-pay | Admitting: Internal Medicine

## 2013-10-08 ENCOUNTER — Other Ambulatory Visit: Payer: Self-pay

## 2013-10-08 DIAGNOSIS — Z1231 Encounter for screening mammogram for malignant neoplasm of breast: Secondary | ICD-10-CM

## 2013-10-31 ENCOUNTER — Ambulatory Visit: Payer: Medicare Other

## 2014-01-02 ENCOUNTER — Ambulatory Visit (INDEPENDENT_AMBULATORY_CARE_PROVIDER_SITE_OTHER): Payer: Medicare Other | Admitting: Neurology

## 2014-01-02 ENCOUNTER — Encounter: Payer: Self-pay | Admitting: Neurology

## 2014-01-02 ENCOUNTER — Ambulatory Visit
Admission: RE | Admit: 2014-01-02 | Discharge: 2014-01-02 | Disposition: A | Discharge status: Home / Self Care | Payer: Medicare Other | Source: Ambulatory Visit | Attending: Internal Medicine | Admitting: Internal Medicine

## 2014-01-02 ENCOUNTER — Encounter (INDEPENDENT_AMBULATORY_CARE_PROVIDER_SITE_OTHER): Payer: Self-pay

## 2014-01-02 DIAGNOSIS — R202 Paresthesia of skin: Secondary | ICD-10-CM

## 2014-01-02 DIAGNOSIS — Z1231 Encounter for screening mammogram for malignant neoplasm of breast: Secondary | ICD-10-CM

## 2014-01-02 DIAGNOSIS — G5600 Carpal tunnel syndrome, unspecified upper limb: Secondary | ICD-10-CM | POA: Insufficient documentation

## 2014-01-02 DIAGNOSIS — G5603 Carpal tunnel syndrome, bilateral upper limbs: Secondary | ICD-10-CM

## 2014-01-02 DIAGNOSIS — Z0289 Encounter for other administrative examinations: Secondary | ICD-10-CM

## 2014-01-02 DIAGNOSIS — M359 Systemic involvement of connective tissue, unspecified: Secondary | ICD-10-CM

## 2014-01-02 HISTORY — DX: Carpal tunnel syndrome, unspecified upper limb: G56.00

## 2014-01-02 NOTE — Procedures (Signed)
     HISTORY:  Jeanne [name] is a 53 year old patient with a history of numbness in both hands. The patient is being evaluated for possible carpal tunnel syndrome. She denies any significant neck discomfort or pain radiating down the arms from the neck.  NERVE CONDUCTION STUDIES:  Nerve conduction studies were performed on both upper extremities. The distal motor latencies for the median nerves were prolonged bilaterally, with a low motor amplitude on the left, normal on the right. The distal motor latencies and motor amplitudes for the ulnar nerves were normal bilaterally. The F wave latencies for the median nerves were prolonged bilaterally, normal for the ulnar nerves bilaterally. The nerve conduction velocities for the median and ulnar nerves were normal bilaterally. The sensory latencies for the median nerves were prolonged bilaterally, normal for the ulnar nerves bilaterally.  Nerve conduction studies were performed on the left lower extremity. The distal motor latencies and motor amplitudes for the peroneal and posterior tibial nerves were within normal limits. The nerve conduction velocities for these nerves were also normal. The sensory latency for the peroneal nerve was within normal limits.   EMG STUDIES:  EMG study was performed on the left upper extremity:  The first dorsal interosseous muscle reveals 2 to 4 K units with full recruitment. No fibrillations or positive waves were noted. The abductor pollicis brevis muscle reveals 2 to 5 K units with slightly decreased recruitment. No fibrillations or positive waves were noted. The extensor indicis proprius muscle reveals 1 to 3 K units with full recruitment. No fibrillations or positive waves were noted. The pronator teres muscle reveals 2 to 3 K units with full recruitment. No fibrillations or positive waves were noted. The biceps muscle reveals 1 to 2 K units with full recruitment. No fibrillations or positive waves were  noted. The triceps muscle reveals 2 to 4 K units with full recruitment. No fibrillations or positive waves were noted. The anterior deltoid muscle reveals 2 to 3 K units with full recruitment. No fibrillations or positive waves were noted. The cervical paraspinal muscles were tested at 2 levels. No abnormalities of insertional activity were seen at either level tested. There was good relaxation.   IMPRESSION:  Nerve conduction studies done on both upper extremities and on the left lower extremity shows no clear evidence of an overlying peripheral neuropathy. There appears to be evidence of bilateral carpal tunnel syndrome of mild severity on the right, and moderate severity on the left. EMG evaluation of the left upper extremity is relatively unremarkable, without evidence of an overlying cervical radiculopathy.  Jill Alexanders MD 01/02/2014 4:41 PM  [facility] [address] [place], [place] [address]  Phone [phone] Fax [phone]

## 2014-01-02 NOTE — Progress Notes (Signed)
Jeanne [name] is a 53 year old patient with a history of a mixed connective tissue disease, and a history of numbness in both hands. The patient was last seen in March of 2015, and nerve conduction studies were ordered, but they have not been done for now secondary to financial reasons on the part the patient. The patient has been having ongoing symptoms.  Nerve conduction studies on both arms and the left leg shows no evidence of a peripheral neuropathy, but there is evidence of bilateral carpal tunnel syndrome. EMG of the left upper extremity does not show an overlying cervical radiculopathy.  The patient will be set up for wrist splints bilaterally. If she continues to have ongoing symptoms after 2 months, she will contact our office. We will consider a referral to a hand surgeon at that time. The patient will followup through this office if needed.

## 2014-01-07 ENCOUNTER — Emergency Department (HOSPITAL_COMMUNITY): Payer: Medicare Other

## 2014-01-07 ENCOUNTER — Emergency Department (HOSPITAL_COMMUNITY)
Admission: EM | Admit: 2014-01-07 | Discharge: 2014-01-07 | Disposition: A | Discharge status: Home / Self Care | Payer: Medicare Other | Attending: Emergency Medicine | Admitting: Emergency Medicine

## 2014-01-07 ENCOUNTER — Encounter (HOSPITAL_COMMUNITY): Payer: Self-pay | Admitting: Emergency Medicine

## 2014-01-07 DIAGNOSIS — H409 Unspecified glaucoma: Secondary | ICD-10-CM | POA: Insufficient documentation

## 2014-01-07 DIAGNOSIS — R6884 Jaw pain: Secondary | ICD-10-CM | POA: Diagnosis not present

## 2014-01-07 DIAGNOSIS — Z86718 Personal history of other venous thrombosis and embolism: Secondary | ICD-10-CM | POA: Diagnosis not present

## 2014-01-07 DIAGNOSIS — Z7982 Long term (current) use of aspirin: Secondary | ICD-10-CM | POA: Diagnosis not present

## 2014-01-07 DIAGNOSIS — R011 Cardiac murmur, unspecified: Secondary | ICD-10-CM | POA: Diagnosis not present

## 2014-01-07 DIAGNOSIS — Z79899 Other long term (current) drug therapy: Secondary | ICD-10-CM | POA: Diagnosis not present

## 2014-01-07 DIAGNOSIS — I10 Essential (primary) hypertension: Secondary | ICD-10-CM | POA: Insufficient documentation

## 2014-01-07 DIAGNOSIS — F3289 Other specified depressive episodes: Secondary | ICD-10-CM | POA: Diagnosis not present

## 2014-01-07 DIAGNOSIS — G5600 Carpal tunnel syndrome, unspecified upper limb: Secondary | ICD-10-CM | POA: Diagnosis not present

## 2014-01-07 DIAGNOSIS — M5380 Other specified dorsopathies, site unspecified: Secondary | ICD-10-CM | POA: Insufficient documentation

## 2014-01-07 DIAGNOSIS — M129 Arthropathy, unspecified: Secondary | ICD-10-CM | POA: Diagnosis not present

## 2014-01-07 DIAGNOSIS — Z872 Personal history of diseases of the skin and subcutaneous tissue: Secondary | ICD-10-CM | POA: Insufficient documentation

## 2014-01-07 DIAGNOSIS — R42 Dizziness and giddiness: Secondary | ICD-10-CM | POA: Diagnosis not present

## 2014-01-07 DIAGNOSIS — F329 Major depressive disorder, single episode, unspecified: Secondary | ICD-10-CM | POA: Insufficient documentation

## 2014-01-07 DIAGNOSIS — G4730 Sleep apnea, unspecified: Secondary | ICD-10-CM | POA: Insufficient documentation

## 2014-01-07 DIAGNOSIS — F17200 Nicotine dependence, unspecified, uncomplicated: Secondary | ICD-10-CM | POA: Insufficient documentation

## 2014-01-07 DIAGNOSIS — F259 Schizoaffective disorder, unspecified: Secondary | ICD-10-CM | POA: Insufficient documentation

## 2014-01-07 DIAGNOSIS — M62830 Muscle spasm of back: Secondary | ICD-10-CM

## 2014-01-07 LAB — BASIC METABOLIC PANEL
Anion gap: 13 (ref 5–15)
BUN: 27 mg/dL — ABNORMAL HIGH (ref 6–23)
CO2: 24 meq/L (ref 19–32)
Calcium: 9.5 mg/dL (ref 8.4–10.5)
Chloride: 101 mEq/L (ref 96–112)
Creatinine, Ser: 1.37 mg/dL — ABNORMAL HIGH (ref 0.50–1.10)
GFR calc Af Amer: 50 mL/min — ABNORMAL LOW (ref >90)
GFR calc non Af Amer: 43 mL/min — ABNORMAL LOW (ref >90)
GLUCOSE: 99 mg/dL (ref 70–99)
Potassium: 4.4 mEq/L (ref 3.7–5.3)
SODIUM: 138 meq/L (ref 137–147)

## 2014-01-07 LAB — I-STAT TROPONIN, ED
TROPONIN I, POC: 0.01 ng/mL (ref 0.00–0.08)
Troponin i, poc: 0 ng/mL (ref 0.00–0.08)

## 2014-01-07 LAB — CBC
HCT: 34.2 % — ABNORMAL LOW (ref 36.0–46.0)
HEMOGLOBIN: 11.9 g/dL — AB (ref 12.0–15.0)
MCH: 34.7 pg — ABNORMAL HIGH (ref 26.0–34.0)
MCHC: 34.8 g/dL (ref 30.0–36.0)
MCV: 99.7 fL (ref 78.0–100.0)
Platelets: 188 10*3/uL (ref 150–400)
RBC: 3.43 MIL/uL — AB (ref 3.87–5.11)
RDW: 12.9 % (ref 11.5–15.5)
WBC: 4.4 10*3/uL (ref 4.0–10.5)

## 2014-01-07 MED ORDER — SODIUM CHLORIDE 0.9 % IV BOLUS (SEPSIS)
1000.0000 mL | Freq: Once | INTRAVENOUS | Status: AC
  Administered 2014-01-07: 1000 mL via INTRAVENOUS

## 2014-01-07 MED ORDER — ASPIRIN 325 MG PO TABS
325.0000 mg | ORAL_TABLET | ORAL | Status: AC
  Administered 2014-01-07: 325 mg via ORAL
  Filled 2014-01-07: qty 1

## 2014-01-07 NOTE — Discharge Instructions (Signed)
Followup with her primary care physician tomorrow at 9:30 AM. Return to the ER if he developed any chest pressure/pain/discomfort, shortness of breath, dizziness, nausea, vomiting or if you have any questions or concerns.   Back Pain, Adult Low back pain is very common. About 1 in 5 people have back pain.The cause of low back pain is rarely dangerous. The pain often gets better over time.About half of people with a sudden onset of back pain feel better in just 2 weeks. About 8 in 10 people feel better by 6 weeks.  CAUSES Some common causes of back pain include:  Strain of the muscles or ligaments supporting the spine.  Wear and tear (degeneration) of the spinal discs.  Arthritis.  Direct injury to the back. DIAGNOSIS Most of the time, the direct cause of low back pain is not known.However, back pain can be treated effectively even when the exact cause of the pain is unknown.Answering your caregiver's questions about your overall health and symptoms is one of the most accurate ways to make sure the cause of your pain is not dangerous. If your caregiver needs more information, he or she may order lab work or imaging tests (X-rays or MRIs).However, even if imaging tests show changes in your back, this usually does not require surgery. HOME CARE INSTRUCTIONS For many people, back pain returns.Since low back pain is rarely dangerous, it is often a condition that people can learn to [facility] their own.   Remain active. It is stressful on the back to sit or stand in one place. Do not sit, drive, or stand in one place for more than 30 minutes at a time. Take short walks on level surfaces as soon as pain allows.Try to increase the length of time you walk each day.  Do not stay in bed.Resting more than 1 or 2 days can delay your recovery.  Do not avoid exercise or work.Your body is made to move.It is not dangerous to be active, even though your back may hurt.Your back will likely heal  faster if you return to being active before your pain is gone.  Pay attention to your body when you bend and lift. Many people have less discomfortwhen lifting if they bend their knees, keep the load close to their bodies,and avoid twisting. Often, the most comfortable positions are those that put less stress on your recovering back.  Find a comfortable position to sleep. Use a firm mattress and lie on your side with your knees slightly bent. If you lie on your back, put a pillow under your knees.  Only take over-the-counter or prescription medicines as directed by your caregiver. Over-the-counter medicines to reduce pain and inflammation are often the most helpful.Your caregiver may prescribe muscle relaxant drugs.These medicines help dull your pain so you can more quickly return to your normal activities and healthy exercise.  Put ice on the injured area.  Put ice in a plastic bag.  Place a towel between your skin and the bag.  Leave the ice on for 15-20 minutes, 03-04 times a day for the first 2 to 3 days. After that, ice and heat may be alternated to reduce pain and spasms.  Ask your caregiver about trying back exercises and gentle massage. This may be of some benefit.  Avoid feeling anxious or stressed.Stress increases muscle tension and can worsen back pain.It is important to recognize when you are anxious or stressed and learn ways to manage it.Exercise is a great option. SEEK MEDICAL CARE IF:  You have pain that is not relieved with rest or medicine.  You have pain that does not improve in 1 week.  You have new symptoms.  You are generally not feeling well. SEEK IMMEDIATE MEDICAL CARE IF:   You have pain that radiates from your back into your legs.  You develop new bowel or bladder control problems.  You have unusual weakness or numbness in your arms or legs.  You develop nausea or vomiting.  You develop abdominal pain.  You feel faint. Document Released:  04/05/2005 Document Revised: 10/05/2011 Document Reviewed: 08/07/2013 [facility] Patient Information 2015 [place], [place]. This information is not intended to replace advice given to you by your health care provider. Make sure you discuss any questions you have with your health care provider.

## 2014-01-07 NOTE — ED Notes (Signed)
The pt was sitting at home and started having bilateral jaw pain and radiated down her back and felt lightheaded and hot all over. Ems arrived and she felt lightheaded and hot but the jaw and back pain and subsided. No chest pain/sob/nause or vomiting. The pt stated that she had an episode like this a year ago but never did seek treatment.    EMS vs 160/90 Hr 80 o2 97 rr 18

## 2014-01-07 NOTE — ED Notes (Signed)
Pt stated that the episode this morning of the jaw pain and back pain lasted around 90 seconds.

## 2014-01-07 NOTE — ED Notes (Signed)
Pt ambulated to the bathroom and back.

## 2014-01-07 NOTE — ED Provider Notes (Signed)
CSN: [id number]     Arrival date & time 01/07/14  8469 History   First MD Initiated Contact with Patient 01/07/14 [phone]     Chief Complaint  Patient presents with  . Jaw Pain     (Consider location/radiation/quality/duration/timing/severity/associated sxs/prior Treatment) HPI Ms. Niue is a 53 year old female past medical history of hypertension who presents the ER with complaint of bilateral jaw pain and back pain. Patient states she's sitting at home, and noted a sharp pain in her lower jaw bilaterally which radiated up into her cheeks laterally. Patient states she then felt a sharp pain in the middle of her shoulder blades which radiated up to the base of her neck. Patient states she noted some mild lightheadedness throughout this entire episode. She states the sharp pain lasted approximately 90 seconds, and her lightheadedness began beforehand, and persisted after until she was brought to the ER by EMS. Patient states her sharp pain nor her lightheadedness were changed in any way by movement, walking, rest. He has no aggravating or alleviating factors. Patient denying any associated nausea, vomiting, shortness of breath, syncope, abdominal pain, dysuria, diarrhea. Past Medical History  Diagnosis Date  . Hypertension   . Glaucoma   . Gastritis   . Schizo-affective psychosis   . Depression   . DVT (deep venous thrombosis) 2014    LEFT LEG  . Heart murmur   . Hypokalemia 03/05/2013  . Neuromuscular disorder     " NEUROPATHY IN  MY HANDS"  . Arthritis   . Sleep apnea     Bipap  . Scleritis of both eyes   . Conn disease   . Clotting disorder   . Antiphospholipid antibody syndrome   . Suppurative hidradenitis   . Difficult intubation 07/09/2013    Glidescope used, see Anesthesia note  . Carpal tunnel syndrome 01/02/2014    Bilateral   Past Surgical History  Procedure Laterality Date  . Cervical conization w/bx    . Bunionectomy    . Lymph node dissection    . Laparoscopic  adrenalectomy Left 07/09/2013    Procedure: LAPAROSCOPIC LEFT  ADRENALECTOMY;  Surgeon: Earnstine Regal, MD;  Location: [facility];  Service: General;  Laterality: Left;   Family History  Problem Relation Age of Onset  . Cancer Maternal Aunt     breast ca  . Heart attack Maternal Grandmother 54  . Heart attack Maternal Grandfather 62  . Hypertension Sister   . Hypertension Sister    History  Substance Use Topics  . Smoking status: Current Every Day Smoker -- 0.50 packs/day for 25 years    Types: Cigarettes  . Smokeless tobacco: Never Used  . Alcohol Use: Yes     Comment: " drink just on weekends "   OB History   Grav Para Term Preterm Abortions TAB SAB Ect Mult Living                 Review of Systems  Constitutional: Negative for fever.  HENT: Negative for trouble swallowing.   Eyes: Negative for visual disturbance.  Respiratory: Negative for shortness of breath.   Cardiovascular: Negative for chest pain, palpitations and leg swelling.  Gastrointestinal: Negative for nausea, vomiting and abdominal pain.  Genitourinary: Negative for dysuria.  Musculoskeletal: Positive for myalgias and neck pain.  Skin: Negative for rash.  Neurological: Positive for light-headedness. Negative for dizziness, syncope, weakness, numbness and headaches.  Psychiatric/Behavioral: Negative.       Allergies  Ace inhibitors and Travatan  Home Medications  Prior to Admission medications   Medication Sig Start Date End Date Taking? Authorizing Provider  amLODipine (NORVASC) 5 MG tablet Take 5 mg by mouth every morning.    Yes Historical Provider, MD  ARIPiprazole (ABILIFY) 30 MG tablet Take 30 mg by mouth daily.   Yes Historical Provider, MD  aspirin EC 81 MG tablet Take 162 mg by mouth daily.   Yes Historical Provider, MD  buPROPion (WELLBUTRIN XL) 300 MG 24 hr tablet Take 300 mg by mouth every morning.    Yes Historical Provider, MD  celecoxib (CELEBREX) 200 MG capsule Take 200 mg by mouth every  Monday, Wednesday, and Friday.    Yes Historical Provider, MD  DULoxetine (CYMBALTA) 60 MG capsule Take 60 mg by mouth every morning.    Yes Historical Provider, MD  esomeprazole (NEXIUM) 40 MG capsule Take 40 mg by mouth daily before breakfast.   Yes Historical Provider, MD  folic acid (FOLVITE) 1 MG tablet Take 1 mg by mouth daily.   Yes Historical Provider, MD  levobunolol (BETAGAN) 0.5 % ophthalmic solution Place 1 drop into both eyes every morning.    Yes Historical Provider, MD  methotrexate (RHEUMATREX) 2.5 MG tablet Take 15 mg by mouth every 7 (seven) days. Mondays   Yes Historical Provider, MD  Multiple Vitamin (MULTIVITAMIN WITH MINERALS) TABS tablet Take 1 tablet by mouth daily.   Yes Historical Provider, MD  NIFEdipine (PROCARDIA-XL/ADALAT CC) 60 MG 24 hr tablet Take 1 tablet (60 mg total) by mouth daily. 07/16/13  Yes Armandina Gemma, MD  oxyCODONE-acetaminophen (PERCOCET) 10-650 MG per tablet Take 1 tablet by mouth every 8 (eight) hours as needed for pain.   Yes Historical Provider, MD  QUEtiapine (SEROQUEL) 400 MG tablet Take 600 mg by mouth at bedtime.    Yes Historical Provider, MD  verapamil (CALAN-SR) 180 MG CR tablet Take 1 tablet (180 mg total) by mouth daily. 07/16/13  Yes Armandina Gemma, MD   BP 147/62  Pulse 82  Temp(Src) 98.8 F (37.1 C) (Oral)  Resp 19  SpO2 100% Physical Exam  Constitutional: She is oriented to person, place, and time. She appears well-developed and well-nourished. No distress.  HENT:  Head: Normocephalic and atraumatic.  Mouth/Throat: Oropharynx is clear and moist. No oropharyngeal exudate.  Eyes: Right eye exhibits no discharge. Left eye exhibits no discharge. No scleral icterus.  Neck: Normal range of motion.  Cardiovascular: Normal rate, regular rhythm and S2 normal.   Murmur heard.  Systolic murmur is present with a grade of 2/6  Pulmonary/Chest: Effort normal and breath sounds normal. No respiratory distress.  Abdominal: Soft. There is no  tenderness.  Musculoskeletal: Normal range of motion. She exhibits no edema and no tenderness.  Mild tenderness along the thoracic spine, which patient states is normal for her.  Neurological: She is alert and oriented to person, place, and time. She has normal strength. No cranial nerve deficit or sensory deficit. Coordination normal. GCS eye subscore is 4. GCS verbal subscore is 5. GCS motor subscore is 6.  Cranial nerves II through XII intact. Motor strength 5 out of 5 in all major muscle groups of upper and lower extremity, finger to nose normal.  Skin: Skin is warm and dry. No rash noted. She is not diaphoretic.  Psychiatric: She has a normal mood and affect.    ED Course  Procedures (including critical care time) Labs Review Labs Reviewed  CBC - Abnormal; Notable for the following:    RBC 3.43 (*)  Hemoglobin 11.9 (*)    HCT 34.2 (*)    MCH 34.7 (*)    All other components within normal limits  BASIC METABOLIC PANEL - Abnormal; Notable for the following:    BUN 27 (*)    Creatinine, Ser 1.37 (*)    GFR calc non Af Amer 43 (*)    GFR calc Af Amer 50 (*)    All other components within normal limits  I-STAT TROPOININ, ED  Randolm Idol, ED    Imaging Review Dg Chest Port 1 View  01/07/2014   CLINICAL DATA:  Jaw pain, interscapular pain.  EXAM: PORTABLE CHEST - 1 VIEW  COMPARISON:  12/05/2012.  FINDINGS: Trachea is midline. Heart size stable. Lungs are low in volume with probable mild bibasilar atelectasis. No airspace consolidation or pleural fluid.  IMPRESSION: Low lung volumes with probable mild bibasilar atelectasis.   Electronically Signed   By: Lorin Picket M.D.   On: 01/07/2014 11:24     EKG Interpretation   Date/Time:  Monday January 07 2014 10:12:44 EDT Ventricular Rate:  74 PR Interval:  189 QRS Duration: 97 QT Interval:  384 QTC Calculation: 426 R Axis:   2 Text Interpretation:  Normal sinus rhythm T waves and ST segments improved  from 2014  Confirmed by GOLDSTON  MD, SCOTT ([id number]) on 01/07/2014 10:30:11 AM      MDM   Final diagnoses:  Jaw pain  Back muscle spasm   53 year old female with past medical history of hypertension here with atypical jaw and back pain. Rule out for ACS. Patient's pain not reproducible with any physical exam a new her, and she is stating she is slightly lightheaded in the ED which has mostly subsided since this episode began.  12:20 PM: Orthostatic vital signs negative. Patient's lightheadedness had some has subsided, patient asymptomatic. We'll continue with second troponin at 3:00.  3:00 PM: Second troponin negative, patient remained asymptomatic. Patient lives remarkable for mildly elevated renal function. We treat patient with fluid bolus, and I encouraged patient to follow up with her primary care physician regarding this. With patient's workup otherwise unremarkable, and patient not having any the same symptoms she experienced earlier today, along with the fact the patient has remained stable, and non-hypoxic, nontachypneic, nontoxic cardiac, and in no acute distress, we have ruled out atypical presentation of ACS, and we do not believe patient requires any further workup at this point. We will discharge patient, and have her followup with her primary care physician regarding these pains, along with her elevated renal function. I discussed return precautions with patient, and encourage her to call or return to the ER should she have any questions or concerns. Patient to see PCP at 9:30 AM tomorrow.  BP 147/62  Pulse 82  Temp(Src) 98.8 F (37.1 C) (Oral)  Resp 19  SpO2 100%   Signed,  Dahlia Bailiff, PA-C 8:57 AM   This patient seen and discussed with Dr. Sherwood Gambler, M.D.  Carrie Mew, PA-C 01/09/14 [phone]

## 2014-01-10 NOTE — ED Provider Notes (Signed)
Medical screening examination/treatment/procedure(s) were conducted as a shared visit with non-physician practitioner(s) and myself.  I personally evaluated the patient during the encounter.   EKG Interpretation   Date/Time:  Monday January 07 2014 10:12:44 EDT Ventricular Rate:  74 PR Interval:  189 QRS Duration: 97 QT Interval:  384 QTC Calculation: 426 R Axis:   2 Text Interpretation:  Normal sinus rhythm T waves and ST segments improved  from 2014 Confirmed by Amelianna Meller  MD, Matthewjames Petrasek ([id number]) on 01/07/2014 10:30:11 AM       Patient with atypical jaw pain. No focal jaw injuries or tenderness on my exam. Concern for possible atypical cardiac equivalent. EKG shows no acute ischemia and initial troponin is normal. Given her risk factors I discussed her case with her PCP, Dr. Brigitte Pulse, who notes that if her workup is negative and she has a second negative troponin he would prefer her to be discharged and followup in his office the next day. Patient is amenable to this plan.  Ephraim Hamburger, MD 01/10/14 2259

## 2014-04-19 DIAGNOSIS — M069 Rheumatoid arthritis, unspecified: Secondary | ICD-10-CM

## 2014-04-19 HISTORY — DX: Rheumatoid arthritis, unspecified: M06.9

## 2014-10-22 ENCOUNTER — Encounter (HOSPITAL_COMMUNITY): Payer: Self-pay

## 2014-10-22 ENCOUNTER — Emergency Department (HOSPITAL_COMMUNITY)
Admission: EM | Admit: 2014-10-22 | Discharge: 2014-10-22 | Disposition: A | Discharge status: Home / Self Care | Payer: Medicare Other | Attending: Emergency Medicine | Admitting: Emergency Medicine

## 2014-10-22 ENCOUNTER — Emergency Department (HOSPITAL_COMMUNITY): Payer: Medicare Other

## 2014-10-22 DIAGNOSIS — Z7982 Long term (current) use of aspirin: Secondary | ICD-10-CM | POA: Diagnosis not present

## 2014-10-22 DIAGNOSIS — F329 Major depressive disorder, single episode, unspecified: Secondary | ICD-10-CM | POA: Insufficient documentation

## 2014-10-22 DIAGNOSIS — R011 Cardiac murmur, unspecified: Secondary | ICD-10-CM | POA: Diagnosis not present

## 2014-10-22 DIAGNOSIS — R6510 Systemic inflammatory response syndrome (SIRS) of non-infectious origin without acute organ dysfunction: Secondary | ICD-10-CM | POA: Insufficient documentation

## 2014-10-22 DIAGNOSIS — Z8639 Personal history of other endocrine, nutritional and metabolic disease: Secondary | ICD-10-CM | POA: Insufficient documentation

## 2014-10-22 DIAGNOSIS — F259 Schizoaffective disorder, unspecified: Secondary | ICD-10-CM | POA: Diagnosis not present

## 2014-10-22 DIAGNOSIS — Z9981 Dependence on supplemental oxygen: Secondary | ICD-10-CM | POA: Diagnosis not present

## 2014-10-22 DIAGNOSIS — H409 Unspecified glaucoma: Secondary | ICD-10-CM | POA: Insufficient documentation

## 2014-10-22 DIAGNOSIS — M1990 Unspecified osteoarthritis, unspecified site: Secondary | ICD-10-CM | POA: Insufficient documentation

## 2014-10-22 DIAGNOSIS — D849 Immunodeficiency, unspecified: Secondary | ICD-10-CM | POA: Diagnosis not present

## 2014-10-22 DIAGNOSIS — R531 Weakness: Secondary | ICD-10-CM | POA: Diagnosis not present

## 2014-10-22 DIAGNOSIS — G4730 Sleep apnea, unspecified: Secondary | ICD-10-CM | POA: Insufficient documentation

## 2014-10-22 DIAGNOSIS — Z79899 Other long term (current) drug therapy: Secondary | ICD-10-CM | POA: Diagnosis not present

## 2014-10-22 DIAGNOSIS — I10 Essential (primary) hypertension: Secondary | ICD-10-CM | POA: Diagnosis not present

## 2014-10-22 DIAGNOSIS — Z720 Tobacco use: Secondary | ICD-10-CM | POA: Diagnosis not present

## 2014-10-22 DIAGNOSIS — Z86718 Personal history of other venous thrombosis and embolism: Secondary | ICD-10-CM | POA: Insufficient documentation

## 2014-10-22 DIAGNOSIS — Z872 Personal history of diseases of the skin and subcutaneous tissue: Secondary | ICD-10-CM | POA: Insufficient documentation

## 2014-10-22 DIAGNOSIS — D899 Disorder involving the immune mechanism, unspecified: Secondary | ICD-10-CM

## 2014-10-22 DIAGNOSIS — R197 Diarrhea, unspecified: Secondary | ICD-10-CM | POA: Diagnosis present

## 2014-10-22 LAB — URINALYSIS, ROUTINE W REFLEX MICROSCOPIC
Bilirubin Urine: NEGATIVE
Glucose, UA: NEGATIVE mg/dL
Hgb urine dipstick: NEGATIVE
Ketones, ur: NEGATIVE mg/dL
Leukocytes, UA: NEGATIVE
Nitrite: NEGATIVE
Protein, ur: NEGATIVE mg/dL
Specific Gravity, Urine: 1.017 (ref 1.005–1.030)
Urobilinogen, UA: 0.2 mg/dL (ref 0.0–1.0)
pH: 5.5 (ref 5.0–8.0)

## 2014-10-22 LAB — I-STAT TROPONIN, ED: TROPONIN I, POC: 0 ng/mL (ref 0.00–0.08)

## 2014-10-22 LAB — COMPREHENSIVE METABOLIC PANEL
ALT: 49 U/L (ref 14–54)
AST: 68 U/L — ABNORMAL HIGH (ref 15–41)
Albumin: 4.1 g/dL (ref 3.5–5.0)
Alkaline Phosphatase: 136 U/L — ABNORMAL HIGH (ref 38–126)
Anion gap: 8 (ref 5–15)
BUN: 28 mg/dL — ABNORMAL HIGH (ref 6–20)
CO2: 25 mmol/L (ref 22–32)
Calcium: 9.2 mg/dL (ref 8.9–10.3)
Chloride: 104 mmol/L (ref 101–111)
Creatinine, Ser: 1.48 mg/dL — ABNORMAL HIGH (ref 0.44–1.00)
GFR calc Af Amer: 45 mL/min — ABNORMAL LOW (ref >60)
GFR calc non Af Amer: 39 mL/min — ABNORMAL LOW (ref >60)
Glucose, Bld: 119 mg/dL — ABNORMAL HIGH (ref 65–99)
Potassium: 4.3 mmol/L (ref 3.5–5.1)
Sodium: 137 mmol/L (ref 135–145)
Total Bilirubin: 0.5 mg/dL (ref 0.3–1.2)
Total Protein: 7.9 g/dL (ref 6.5–8.1)

## 2014-10-22 LAB — CBC WITH DIFFERENTIAL/PLATELET
Basophils Absolute: 0 10*3/uL (ref 0.0–0.1)
Basophils Relative: 0 % (ref 0–1)
Eosinophils Absolute: 0.1 10*3/uL (ref 0.0–0.7)
Eosinophils Relative: 2 % (ref 0–5)
HCT: 34.4 % — ABNORMAL LOW (ref 36.0–46.0)
Hemoglobin: 11.9 g/dL — ABNORMAL LOW (ref 12.0–15.0)
Lymphocytes Relative: 5 % — ABNORMAL LOW (ref 12–46)
Lymphs Abs: 0.4 10*3/uL — ABNORMAL LOW (ref 0.7–4.0)
MCH: 34.1 pg — ABNORMAL HIGH (ref 26.0–34.0)
MCHC: 34.6 g/dL (ref 30.0–36.0)
MCV: 98.6 fL (ref 78.0–100.0)
Monocytes Absolute: 0.7 10*3/uL (ref 0.1–1.0)
Monocytes Relative: 9 % (ref 3–12)
Neutro Abs: 6.5 10*3/uL (ref 1.7–7.7)
Neutrophils Relative %: 84 % — ABNORMAL HIGH (ref 43–77)
Platelets: 128 10*3/uL — ABNORMAL LOW (ref 150–400)
RBC: 3.49 MIL/uL — ABNORMAL LOW (ref 3.87–5.11)
RDW: 13.3 % (ref 11.5–15.5)
WBC: 7.7 10*3/uL (ref 4.0–10.5)

## 2014-10-22 LAB — I-STAT CG4 LACTIC ACID, ED
Lactic Acid, Venous: 1.11 mmol/L (ref 0.5–2.0)
Lactic Acid, Venous: 0.65 mmol/L (ref 0.5–2.0)

## 2014-10-22 LAB — LIPASE, BLOOD: Lipase: 22 U/L (ref 22–51)

## 2014-10-22 MED ORDER — ACETAMINOPHEN 325 MG PO TABS
650.0000 mg | ORAL_TABLET | Freq: Once | ORAL | Status: AC
  Administered 2014-10-22: 650 mg via ORAL
  Filled 2014-10-22: qty 2

## 2014-10-22 MED ORDER — PIPERACILLIN-TAZOBACTAM 3.375 G IVPB: 3.3750 g | Freq: Three times a day (TID) | INTRAVENOUS | Status: DC

## 2014-10-22 MED ORDER — ACETAMINOPHEN 325 MG PO TABS: 650.0000 mg | ORAL_TABLET | Freq: Four times a day (QID) | ORAL | Status: DC | PRN

## 2014-10-22 MED ORDER — SODIUM CHLORIDE 0.9 % IV BOLUS (SEPSIS)
1000.0000 mL | INTRAVENOUS | Status: AC
  Administered 2014-10-22 (×2): 1000 mL via INTRAVENOUS

## 2014-10-22 MED ORDER — CIPROFLOXACIN HCL 500 MG PO TABS: 500.0000 mg | ORAL_TABLET | Freq: Two times a day (BID) | ORAL | Status: DC

## 2014-10-22 MED ORDER — SODIUM CHLORIDE 0.9 % IV BOLUS (SEPSIS)
500.0000 mL | INTRAVENOUS | Status: AC
  Administered 2014-10-22: 500 mL via INTRAVENOUS

## 2014-10-22 MED ORDER — METRONIDAZOLE 500 MG PO TABS: 500.0000 mg | ORAL_TABLET | Freq: Two times a day (BID) | ORAL | Status: DC

## 2014-10-22 MED ORDER — PIPERACILLIN-TAZOBACTAM 3.375 G IVPB 30 MIN
3.3750 g | Freq: Once | INTRAVENOUS | Status: AC
  Administered 2014-10-22: 3.375 g via INTRAVENOUS
  Filled 2014-10-22 (×2): qty 50

## 2014-10-22 NOTE — ED Notes (Signed)
Unable to complete orders, making bed available for this pt at this time.

## 2014-10-22 NOTE — ED Notes (Signed)
Pt reports that she cannot take Tylenol.

## 2014-10-22 NOTE — ED Provider Notes (Signed)
CSN: [id number]     Arrival date & time 10/22/14  1358 History   First MD Initiated Contact with Patient 10/22/14 1507     Chief Complaint  Patient presents with  . Chills  . Diarrhea     (Consider location/radiation/quality/duration/timing/severity/associated sxs/prior Treatment) HPI Comments: Pt comes in with cc of chills, weakness and diarrhea. Pt has hx of RA on immunosupressives. She reports that she started feeling sick this afternoon and has generalized weakness. She also had some chills. Pt has been dieting x 2 weeks and thought that the sx were from that. Pt was unaware that she had a fever until she got here. Pt had 5 loose BM today, watery, no blood. She has no abd pain and no nausea or emesis. Pt denies chest pains, cough, nausea, emesis, shortness of breath, headaches, abdominal pain, uti like symptoms, neck pain.   ROS 10 Systems reviewed and are negative for acute change except as noted in the HPI.         Patient is a 54 y.o. female presenting with diarrhea. The history is provided by the patient.  Diarrhea Associated symptoms: chills and fever     Past Medical History  Diagnosis Date  . Hypertension   . Glaucoma   . Gastritis   . Schizo-affective psychosis   . Depression   . DVT (deep venous thrombosis) 2014    LEFT LEG  . Heart murmur   . Hypokalemia 03/05/2013  . Neuromuscular disorder     " NEUROPATHY IN  MY HANDS"  . Arthritis   . Sleep apnea     Bipap  . Scleritis of both eyes   . Conn disease   . Clotting disorder   . Antiphospholipid antibody syndrome   . Suppurative hidradenitis   . Difficult intubation 07/09/2013    Glidescope used, see Anesthesia note  . Carpal tunnel syndrome 01/02/2014    Bilateral   Past Surgical History  Procedure Laterality Date  . Cervical conization w/bx    . Bunionectomy    . Lymph node dissection    . Laparoscopic adrenalectomy Left 07/09/2013    Procedure: LAPAROSCOPIC LEFT  ADRENALECTOMY;  Surgeon: Earnstine Regal, MD;  Location: [facility];  Service: General;  Laterality: Left;   Family History  Problem Relation Age of Onset  . Cancer Maternal Aunt     breast ca  . Heart attack Maternal Grandmother 54  . Heart attack Maternal Grandfather 62  . Hypertension Sister   . Hypertension Sister    History  Substance Use Topics  . Smoking status: Current Every Day Smoker -- 0.50 packs/day for 25 years    Types: Cigarettes  . Smokeless tobacco: Never Used  . Alcohol Use: Yes     Comment: " drink just on weekends "   OB History    No data available     Review of Systems  Constitutional: Positive for fever, chills and fatigue.  Gastrointestinal: Positive for diarrhea.  All other systems reviewed and are negative.     Allergies  Ace inhibitors and Travatan  Home Medications   Prior to Admission medications   Medication Sig Start Date End Date Taking? Authorizing Provider  amLODipine (NORVASC) 5 MG tablet Take 5 mg by mouth every morning.    Yes Historical Provider, MD  ARIPiprazole (ABILIFY) 30 MG tablet Take 30 mg by mouth every other day.    Yes Historical Provider, MD  aspirin EC 81 MG tablet Take 162 mg by mouth  daily.   Yes Historical Provider, MD  atorvastatin (LIPITOR) 20 MG tablet Take 1 tablet by mouth daily. 10/22/14  Yes Historical Provider, MD  buPROPion (WELLBUTRIN XL) 300 MG 24 hr tablet Take 300 mg by mouth every morning.    Yes Historical Provider, MD  Calcium Carbonate (CALCIUM 600 PO) Take 1 tablet by mouth daily.   Yes Historical Provider, MD  DULoxetine (CYMBALTA) 60 MG capsule Take 60 mg by mouth every morning.    Yes Historical Provider, MD  esomeprazole (NEXIUM) 40 MG capsule Take 40 mg by mouth daily before breakfast.   Yes Historical Provider, MD  folic acid (FOLVITE) 1 MG tablet Take 1 mg by mouth daily.   Yes Historical Provider, MD  glucosamine-chondroitin 500-400 MG tablet Take 2 tablets by mouth every morning.   Yes Historical Provider, MD  leflunomide (ARAVA)  20 MG tablet Take 1 tablet by mouth daily. 10/22/14  Yes Historical Provider, MD  levobunolol (BETAGAN) 0.5 % ophthalmic solution Place 1 drop into both eyes every morning.    Yes Historical Provider, MD  methotrexate 50 MG/2ML injection Inject 1 mL into the skin once a week. Wednesday 09/17/14  Yes Historical Provider, MD  Multiple Vitamin (MULTIVITAMIN WITH MINERALS) TABS tablet Take 1 tablet by mouth daily.   Yes Historical Provider, MD  NIFEdipine (PROCARDIA-XL/ADALAT CC) 60 MG 24 hr tablet Take 1 tablet (60 mg total) by mouth daily. 07/16/13  Yes Armandina Gemma, MD  oxyCODONE-acetaminophen (PERCOCET) 10-650 MG per tablet Take 1 tablet by mouth every 8 (eight) hours as needed for pain.   Yes Historical Provider, MD  QUEtiapine (SEROQUEL) 300 MG tablet Take 600 mg by mouth at bedtime. 10/07/14  Yes Historical Provider, MD  verapamil (CALAN-SR) 180 MG CR tablet Take 1 tablet (180 mg total) by mouth daily. 07/16/13  Yes Armandina Gemma, MD  ciprofloxacin (CIPRO) 500 MG tablet Take 1 tablet (500 mg total) by mouth 2 (two) times daily. 10/22/14   Varney Biles, MD  metroNIDAZOLE (FLAGYL) 500 MG tablet Take 1 tablet (500 mg total) by mouth 2 (two) times daily. 10/22/14   Ashmi Blas, MD   BP 122/51 mmHg  Pulse 69  Temp(Src) 99.7 F (37.6 C) (Oral)  Resp 18  Ht 5\' 9"  (1.753 m)  Wt 260 lb (117.935 kg)  BMI 38.38 kg/m2  SpO2 95% Physical Exam  Constitutional: She is oriented to person, place, and time. She appears well-developed and well-nourished.  HENT:  Head: Normocephalic and atraumatic.  Eyes: EOM are normal. Pupils are equal, round, and reactive to light.  Neck: Neck supple.  Cardiovascular: Normal rate, regular rhythm and normal heart sounds.   No murmur heard. Pulmonary/Chest: Effort normal. No respiratory distress.  Abdominal: Soft. She exhibits no distension. There is no tenderness. There is no rebound and no guarding.  Neurological: She is alert and oriented to person, place, and time.  Skin:  Skin is warm and dry.  Nursing note and vitals reviewed.   ED Course  Procedures (including critical care time) Labs Review Labs Reviewed  CBC WITH DIFFERENTIAL/PLATELET - Abnormal; Notable for the following:    RBC 3.49 (*)    Hemoglobin 11.9 (*)    HCT 34.4 (*)    MCH 34.1 (*)    Platelets 128 (*)    Neutrophils Relative % 84 (*)    Lymphocytes Relative 5 (*)    Lymphs Abs 0.4 (*)    All other components within normal limits  COMPREHENSIVE METABOLIC PANEL - Abnormal; Notable for  the following:    Glucose, Bld 119 (*)    BUN 28 (*)    Creatinine, Ser 1.48 (*)    AST 68 (*)    Alkaline Phosphatase 136 (*)    GFR calc non Af Amer 39 (*)    GFR calc Af Amer 45 (*)    All other components within normal limits  CULTURE, BLOOD (ROUTINE X 2)  CULTURE, BLOOD (ROUTINE X 2)  URINE CULTURE  LIPASE, BLOOD  URINALYSIS, ROUTINE W REFLEX MICROSCOPIC (NOT AT [facility])  GI PATHOGEN PANEL BY PCR, STOOL  I-STAT CG4 LACTIC ACID, ED  I-STAT TROPOININ, ED  I-STAT CG4 LACTIC ACID, ED    Imaging Review Dg Chest 2 View  10/22/2014   CLINICAL DATA:  Fever and chills for 1 day  EXAM: CHEST  2 VIEW  COMPARISON:  January 07, 2014  FINDINGS: There is no edema or consolidation. The heart size and pulmonary vascularity are normal. No adenopathy. There is atherosclerotic calcification in the aortic arch. No bone lesions.  IMPRESSION: No edema or consolidation.   Electronically Signed   By: Lowella Grip III M.D.   On: 10/22/2014 16:42     EKG Interpretation   Date/Time:  Tuesday October 22 2014 16:26:00 EDT Ventricular Rate:  85 PR Interval:  192 QRS Duration: 95 QT Interval:  338 QTC Calculation: 402 R Axis:   3 Text Interpretation:  Sinus rhythm Low voltage, precordial leads  Borderline T abnormalities, inferior leads No significant change since  last tracing Confirmed by Rhen Kawecki, MD, Demont Linford [phone]) on 10/22/2014 5:47:03  PM      @8 :00 pm - Pt's results discussed. Advised admission vs. D/c  with return precautions, and she prefers going home. Her WC is normal, fever improved. She has had no diarrhea here, we have asked her to give a sample if she has a BM. She agrees with iv fluids and antibiotics. She will be reassessed post therapy.  @9 :15 pm: Pt still has no diarrhea. Feels better. Prefers going home as she has mother to take care of. Strict return precautions discussed for sepsis. Pt also explained the importance of close f/u and prompt return given her immunosuppressed status. She expresses understanding.    MDM   Final diagnoses:  SIRS (systemic inflammatory response syndrome)  Immunosuppressed status    Pt comes in with cc of fevers, chills, weakness. Pt has hx of connective tissues disorder and is on immunosupressives. She has a fever of 103 and reports loose BM x 5, no blood. She has no abd pain and ROS is neg for any other source of infection over the neurologic, pulm, skin organ system. Will get sepsis workup initiated. Will get blood cultures and stool studies. Empiric antibiotics to be started.     Varney Biles, MD 10/22/14 2137

## 2014-10-22 NOTE — Progress Notes (Signed)
ANTIBIOTIC CONSULT NOTE - INITIAL  Pharmacy Consult for Zosyn Indication: Sepsis 2/2 intra-abd infxn  Allergies  Allergen Reactions  . Ace Inhibitors Anaphylaxis  . Travatan [Travoprost] Other (See Comments)    Red eyes, irritation     Patient Measurements: Height: 5\' 9"  (175.3 cm) Weight: 260 lb (117.935 kg) IBW/kg (Calculated) : 66.2 Adjusted Body Weight:   Vital Signs: Temp: 103 F (39.4 C) (07/05 1429) Temp Source: Oral (07/05 1429) BP: 150/67 mmHg (07/05 1429) Pulse Rate: 107 (07/05 1429) Intake/Output from previous day:   Intake/Output from this shift:    Labs:  Recent Labs  10/22/14 1454  WBC 7.7  HGB 11.9*  PLT 128*  CREATININE 1.48*   Estimated Creatinine Clearance: 59.6 mL/min (by C-G formula based on Cr of 1.48). No results for input(s): VANCOTROUGH, VANCOPEAK, VANCORANDOM, GENTTROUGH, GENTPEAK, GENTRANDOM, TOBRATROUGH, TOBRAPEAK, TOBRARND, AMIKACINPEAK, AMIKACINTROU, AMIKACIN in the last 72 hours.   Microbiology: No results found for this or any previous visit (from the past 720 hour(s)).  Medical History: Past Medical History  Diagnosis Date  . Hypertension   . Glaucoma   . Gastritis   . Schizo-affective psychosis   . Depression   . DVT (deep venous thrombosis) 2014    LEFT LEG  . Heart murmur   . Hypokalemia 03/05/2013  . Neuromuscular disorder     " NEUROPATHY IN  MY HANDS"  . Arthritis   . Sleep apnea     Bipap  . Scleritis of both eyes   . Conn disease   . Clotting disorder   . Antiphospholipid antibody syndrome   . Suppurative hidradenitis   . Difficult intubation 07/09/2013    Glidescope used, see Anesthesia note  . Carpal tunnel syndrome 01/02/2014    Bilateral    Assessment: 41 yoF with PMHx arthritis on leflunomide and weekly methotrexate, anti-phospholipid syndrome, depression and schizo-affective psychosis presents with chills, lightheadedness, and diarrhea. SCr elevated and temperature 103.  Code sepsis called.   Pharmacy consulted to start zosyn for sepsis 2/2 possible intra-abdominal infection.  CrCl ~60 ml/min.   7/5 >> Zosyn  >>  7/5 blood: ordered 7/5 urine: collected  / sputum:   Goal of Therapy:  Eradication of infection  Plan:  Zosyn 3.375g IV q8h (infuse over 4 hours) F/u renal fxn, cultures, clinical course  Ralene Bathe, PharmD, BCPS 10/22/2014, 5:20 PM  Pager: [phone]

## 2014-10-22 NOTE — Discharge Instructions (Signed)
We saw you in the ER for the diarrhea and fevers, chills. All the results in the ER are normal. We are not sure what is causing your symptoms - but we think it is important to cover for abdominal infection. The workup in the ER is not complete, and is limited to screening for life threatening and emergent conditions only, so please see a primary care doctor for further evaluation.  Please return to the ER if your symptoms worsen; you have increased pain, fevers, chills, inability to keep any medications down, confusion. Otherwise see the outpatient doctor as requested. Cup for stool sample is provided.   Infection Control in the Home These are general guidelines on infection control for family and friends providing care in the home. This information is not a substitute for instructions given by your doctor. HOW INFECTIONS ARE SPREAD In order for an infection to spread, the following must be present:  A germ - virus or bacteria.  A place for the germ to live - person, animal, plant, food, soil or water.  A susceptible host - a person who does not have resistance (immunity) to the germ.  A way for the germ to enter the host:  Direct contact - shaking hands, hugging, etc.  Indirect contact - when food, water, bandages or other substances contaminated by the germ enter the host.  Droplets - such as those produced by a sneeze or cough which can travel several feet in the air.  Tiny dust particles that travel long distances in the air.  Cover nose and mouth with a tissue when coughing or sneezing or cough into your elbow.  Dispose of used tissue in the nearest waste receptacle.  Clean hands with soap and water, an alcohol-based rub after touching respiratory secretions or handling contaminated objects. CLEANLINESS IS THE KEY!  Everyone must wash hands before handling or eating food, and after using the toilet, changing a diaper, touching pets, money, uncooked food, coughing, sneezing,  blowing nose, touching nose, eyes or mouth.  Caregivers must wash hands before and after giving care: cleaning wounds or carrying for catheters, changing bandages or handling soiled linens, and giving mouth care or cleaning private parts.  Wash hands properly - use lots of warm, running water and soap to lather hands and wrist. Scrub for at least 15 seconds including under the fingernails.  Rinse well with hands pointing down to allow germs to flow into sink. Dry with clean paper or cloth towel. Use lotion for dry skin.  Stock up on cleaning supplies: disinfectants, separate sponges for bathroom and kitchen, paper towels, utility gloves (replace when cracked, torn or start to peel).  Use bleach safely - never mix with other cleaning products!  Take care of your cleaning supplies - toilet brushes, mops and sponges can breed germs. Soak them in bleach and water for 5 minutes after each use. HOME CARE INSTRUCTIONS  Provide good ventilation in the home.  Be cautious with pet care - do not let those with weakened immune systems touch bird dropping, fish tank water or cat feces (change litter daily). Bathroom  Provide liquid soap - bar soap can become a home for bacteria.  Change towels and washcloths daily to prevent possible fungal infections.  Change toothbrushes often and store them separately in a clean dry place.  Disinfect the toilet.  Clean the tub, shower and sink with standard cleaning products.  Mop the floor once a week with a standard cleaner.  Do not share personal  items such as razors, toothbrushes, glasses, combs, brushes, towels and washcloths. Kitchen  Store food carefully: refrigerate leftovers promptly in covered containers. Throw out stale or spoiled food. Clean the inside of refrigerator weekly. Keep refrigerator set at 71F or less and freezer at [facility] or less. Thaw foods in the refrigerator or microwave. Serve foods at proper temperature.  Take care with meat:  never eat raw. Make sure it cooked to the appropriate temperature and cook eggs until they are firm.  Wash fruits and vegetables under running water.  Use separate cutting boards, plates and utensils for raw and cooked foods.  Keep work surfaces clean.  Use a clean spoon each time you sample food while cooking.  Wash dishes and utensils in hot soapy water, air dry or use a dishwasher.  Do not share forks, cups or spoons during meals.  Do not pour used mop water down the sink. Pour it down the toilet instead. Doing laundry  Wear gloves if laundry is visibly soiled.  Change linens once a week or when soiled.  Do not shake soiled linens - you may send germs into the air. Dispose of contaminated wastes  Place dressings, sanitary or incontinence pads, diapers, or gloves in plastic garbage bags for disposal. [place] OF INFECTION  Inflamed skin-skin that is red, hot, swollen or has a rash.  Fever or chills.  Pus-green or yellow drainage from a wound.  Nausea or vomiting.  Persistent diarrhea.  Cough or sore throat.  Painful urination. Document Released: 01/13/2008 Document Revised: 06/28/2011 Document Reviewed: 01/13/2008 [facility] Patient Information 2015 [place], [place]. This information is not intended to replace advice given to you by your health care provider. Make sure you discuss any questions you have with your health care provider.    Sepsis Sepsis is a serious infection of your blood or tissues that affects your whole body. The infection that causes sepsis may be bacterial, viral, fungal, or parasitic. Sepsis may be life threatening. Sepsis can cause your blood pressure to drop. This may result in shock. Shock causes your central nervous system and your organs to stop working correctly.  RISK FACTORS Sepsis can happen in anyone, but it is more likely to happen in people who have weakened immune systems. SIGNS AND SYMPTOMS  Symptoms of sepsis can  include: Fever or low body temperature (hypothermia). Rapid breathing (hyperventilation). Chills. Rapid heartbeat (tachycardia). Confusion or light-headedness. Trouble breathing. Urinating much less than usual. Cool, clammy skin or red, flushed skin. Other problems with the heart, kidneys, or brain. DIAGNOSIS  Your health care provider will likely do tests to look for an infection, to see if the infection has spread to your blood, and to see how serious your condition is. Tests can include: Blood tests, including cultures of your blood. Cultures of other fluids from your body, such as: Urine. Pus from wounds. Mucus coughed up from your lungs. Urine tests other than cultures. X-ray exams or other imaging tests. TREATMENT  Treatment will begin with elimination of the source of infection. If your sepsis is likely caused by a bacterial or fungal infection, you will be given antibiotic or antifungal medicines. You may also receive: Oxygen. Fluids through an IV tube. Medicines to increase your blood pressure. A machine to clean your blood (dialysis) if your kidneys fail. A machine to help you breathe if your lungs fail. SEEK IMMEDIATE MEDICAL CARE IF: You get an infection or develop any of the signs and symptoms of sepsis after surgery  or a hospitalization. Document Released: 01/02/2003 Document Revised: 04/10/2013 Document Reviewed: 12/11/2012 [facility] Patient Information 2015 [place], [place]. This information is not intended to replace advice given to you by your health care provider. Make sure you discuss any questions you have with your health care provider. Colitis Colitis is inflammation of the colon. Colitis can be a short-term or long-standing (chronic) illness. Crohn's disease and ulcerative colitis are 2 types of colitis which are chronic. They usually require lifelong treatment. CAUSES  There are many different causes of colitis, including: Viruses. Germs  (bacteria). Medicine reactions. SYMPTOMS  Diarrhea. Intestinal bleeding. Pain. Fever. Throwing up (vomiting). Tiredness (fatigue). Weight loss. Bowel blockage. DIAGNOSIS  The diagnosis of colitis is based on examination and stool or blood tests. X-rays, CT scan, and colonoscopy may also be needed. TREATMENT  Treatment may include: Fluids given through the vein (intravenously). Bowel rest (nothing to eat or drink for a period of time). Medicine for pain and diarrhea. Medicines (antibiotics) that kill germs. Cortisone medicines. Surgery. HOME CARE INSTRUCTIONS  Get plenty of rest. Drink enough water and fluids to keep your urine clear or pale yellow. Eat a well-balanced diet. Call your caregiver for follow-up as recommended. SEEK IMMEDIATE MEDICAL CARE IF:  You develop chills. You have an oral temperature above 102 F (38.9 C), not controlled by medicine. You have extreme weakness, fainting, or dehydration. You have repeated vomiting. You develop severe belly (abdominal) pain or are passing bloody or tarry stools. MAKE SURE YOU:  Understand these instructions. Will watch your condition. Will get help right away if you are not doing well or get worse. Document Released: 05/13/2004 Document Revised: 06/28/2011 Document Reviewed: 08/08/2009 [facility] Patient Information 2015 [place], [place]. This information is not intended to replace advice given to you by your health care provider. Make sure you discuss any questions you have with your health care provider.

## 2014-10-22 NOTE — ED Notes (Signed)
Questions r/t dc were denied. Pt ambulatory and a&ox4 

## 2014-10-22 NOTE — ED Notes (Signed)
Pt c/o chills, lightheadedness, and diarrhea starting this afternoon.  Denies pain.  Pt reports that she has been dieting x 2 weeks.  Sts "the symptoms came on all of a sudden."

## 2014-10-24 LAB — URINE CULTURE

## 2014-10-27 LAB — CULTURE, BLOOD (ROUTINE X 2)
CULTURE: NO GROWTH
Culture: NO GROWTH

## 2014-12-09 ENCOUNTER — Other Ambulatory Visit: Payer: Self-pay

## 2014-12-09 DIAGNOSIS — Z1231 Encounter for screening mammogram for malignant neoplasm of breast: Secondary | ICD-10-CM

## 2015-01-21 ENCOUNTER — Ambulatory Visit
Admission: RE | Admit: 2015-01-21 | Discharge: 2015-01-21 | Disposition: A | Discharge status: Home / Self Care | Payer: Medicare Other | Source: Ambulatory Visit

## 2015-01-21 DIAGNOSIS — Z1231 Encounter for screening mammogram for malignant neoplasm of breast: Secondary | ICD-10-CM

## 2015-04-10 ENCOUNTER — Ambulatory Visit (INDEPENDENT_AMBULATORY_CARE_PROVIDER_SITE_OTHER): Payer: Medicare Other | Admitting: Psychiatry

## 2015-04-10 ENCOUNTER — Encounter (HOSPITAL_COMMUNITY): Payer: Self-pay | Admitting: Psychiatry

## 2015-04-10 VITALS — BP 144/82 | HR 80 | Ht 69.5 in | Wt 263.4 lb

## 2015-04-10 DIAGNOSIS — F251 Schizoaffective disorder, depressive type: Secondary | ICD-10-CM

## 2015-04-10 DIAGNOSIS — F429 Obsessive-compulsive disorder, unspecified: Secondary | ICD-10-CM | POA: Insufficient documentation

## 2015-04-10 DIAGNOSIS — F17210 Nicotine dependence, cigarettes, uncomplicated: Secondary | ICD-10-CM | POA: Diagnosis not present

## 2015-04-10 MED ORDER — ARIPIPRAZOLE 30 MG PO TABS: 30.0000 mg | ORAL_TABLET | Freq: Every day | ORAL | Status: DC

## 2015-04-10 MED ORDER — DULOXETINE HCL 60 MG PO CPEP: 60.0000 mg | ORAL_CAPSULE | Freq: Every morning | ORAL | Status: DC

## 2015-04-10 MED ORDER — QUETIAPINE FUMARATE 300 MG PO TABS: 600.0000 mg | ORAL_TABLET | Freq: Every day | ORAL | Status: DC

## 2015-04-10 MED ORDER — BUPROPION HCL ER (XL) 300 MG PO TB24: 300.0000 mg | ORAL_TABLET | Freq: Every morning | ORAL | Status: DC

## 2015-04-10 NOTE — Progress Notes (Signed)
Psychiatric Initial Adult Assessment   Patient Identification: Jeanne [name] MRN:  [id number] Date of Evaluation:  04/10/2015 Referral Source: self Chief Complaint:   Chief Complaint    Depression     Visit Diagnosis:    ICD-9-CM ICD-10-CM   1. Schizoaffective disorder, depressive type ([place]) 295.70 F25.1 QUEtiapine (SEROQUEL) 300 MG tablet     DULoxetine (CYMBALTA) 60 MG capsule     buPROPion (WELLBUTRIN XL) 300 MG 24 hr tablet     ARIPiprazole (ABILIFY) 30 MG tablet  2. OCD (obsessive compulsive disorder) 300.3 F42.9 DULoxetine (CYMBALTA) 60 MG capsule  3. Cigarette nicotine dependence without complication [id number]    Diagnosis:   Patient Active Problem List   Diagnosis Date Noted  . Cigarette nicotine dependence without complication [id number]  . OCD (obsessive compulsive disorder) [F42.9] 04/10/2015  . Schizoaffective disorder, depressive type ([place]) [F25.1] 04/10/2015  . Carpal tunnel syndrome [G56.00] 01/02/2014  . Nausea alone [R11.0] 08/01/2013  . Obesity, Class III, BMI 40-49.9 (morbid obesity) ([place]) [E66.01] 07/14/2013  . Ileus ([place]) [K56.7] 07/11/2013  . 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 DVT (deep venous thrombosis) ([place]) [I82.409] 03/22/2013  . Nonspecific abnormal electrocardiogram (ECG) (EKG) [R94.31] 03/05/2013  . Paresthesia [R20.2] 03/05/2013  . Accelerated hypertension [I10] 03/05/2013  . Schizoaffective disorder ([place]) [F25.9] 03/05/2013  . Obsessive compulsive disorder [F42.9] 03/05/2013  . Depression [F32.9] 03/05/2013  . Obstructive sleep apnea [G47.33] 03/05/2013  . Glaucoma [H40.9] 03/05/2013  . 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 Pt has been taking Abilify, Seroquel, Wellbutrin and Cymbalta for many years and pt states she is stable  on this combination.   Trey Paula is her therapist and pt will continue to see her.  Stater "right now everything is pretty quiet".   Pt reports last time she had any AVH was about 4 years ago. She Schizophrenia symptoms flair with stress. Overall she has been doing well.   When her depression gets bad pt will not get out of bed and thoughts tend toward catastrophe. Last depression episode was about 4 yrs ago. Pt denies depression. Denies anhedonia, isolation, crying spells, low motivation, poor hygiene, worthlessness and hopelessness. Denies SI/HI. States she doesn't do much but does go to [employer].  Spends her days taking car of her sick mom. She spends time with her nieces and nephews about once a month.   Sleeping about 6 hrs/night.  Appetite is good. Energy is ok and pt is working out twice a week. Concentration is good.   Pt is taking her meds as prescribed and reports SE of weight gain. Pt has gained 100lbs since 2003. States this combination is effective and she doesn't want meds changed.    Elements:  Severity:  severe. Timing:  ongoing. Duration:  years. Context:  quality of life. Associated Signs/Symptoms: Depression Symptoms:  denies (Hypo) Manic Symptoms:  negative Anxiety Symptoms:  Obsessive Compulsive Symptoms:   None,, denies symptoms of social anxiety, specific phobias, panic attacks and excessive  worry last flair was in fall of 2015 and is associated with stress Psychotic Symptoms:  negative. last time she experienced AVH was in 2012 PTSD Symptoms: Negative  Past Medical History:  Past Medical History  Diagnosis Date  . Hypertension   . Glaucoma   . Gastritis   . Schizo-affective psychosis ([place])   . Depression   .  DVT (deep venous thrombosis) ([place]) 2014    LEFT LEG  . Heart murmur   . Hypokalemia 03/05/2013  . Neuromuscular disorder ([place])     " NEUROPATHY IN  MY HANDS"  . Arthritis   . Sleep apnea     Bipap  . Scleritis of both eyes   . 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 Clotting disorder ([place])   . Antiphospholipid antibody syndrome ([place])   . Suppurative hidradenitis   . Difficult intubation 07/09/2013    Glidescope used, see Anesthesia note  . Carpal tunnel syndrome 01/02/2014    Bilateral  . Obsessive-compulsive disorder     Past Surgical History  Procedure Laterality Date  . Cervical conization w/bx    . Bunionectomy    . Lymph node dissection    . Laparoscopic adrenalectomy Left 07/09/2013    Procedure: LAPAROSCOPIC LEFT  ADRENALECTOMY;  Surgeon: Earnstine Regal, MD;  Location: [facility];  Service: General;  Laterality: Left;   Past Psych Hx: Dx: Schizoaffective disorder- depressed type dx in 2001; OCD hx in 1994 Meds: Risperdal, Zyprexa, Prolixin, Prozac, Zoloft, Effexor, Lamictal Previous psychiatrist/therapist: Dr. Wylene Simmer last seen in Aug 2016 due to insurance reasons had to stop Hospitalizations: 5-6 times, last time was in 2004 in [place].[name] 2 yr outpatient day program in [place]. , 1 yr of outpt at [facility] and 3 hospitalizations at [facility] and [facility].  SIB: denies Suicide attempts: denies, but did have many years of SI Hx of violent behavior towards others: denies Current access to guns: debues Hx of abuse: denies Military Hx: denies   Family History:  Family History  Problem Relation Age of Onset  . Cancer Maternal Aunt     breast ca  . Heart attack Maternal Grandmother 54  . Heart attack Maternal Grandfather 62  . Hypertension Sister   . Hypertension Sister   . Seizures Maternal Uncle   . Alcohol abuse Neg Hx   . Anxiety disorder Neg Hx   . Bipolar disorder Neg Hx   . Depression Neg Hx   . Drug abuse Neg Hx   . Schizophrenia Neg Hx    Social History:   Social History   Social History  . Marital Status: Single    Spouse Name: N/A  . Number of Children: 0  . Years of Education: college   Occupational History  . Retired    Social History Main Topics  . Smoking status: Current Every Day Smoker -- 0.50  packs/day for 25 years    Types: Cigarettes  . Smokeless tobacco: Never Used  . Alcohol Use: No     Comment: " drink just on weekends " Last drink was in the spring of 2016  . Drug Use: No  . Sexual Activity: Not Currently   Other Topics Concern  . None   Social History Narrative   Pt is living in [place] with mother. Born and raised in [place] by parents. Childhood was fine. Pt has 3 older sisters. Pt has a law degree. 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 Single, no kids.    Additional Social History: n/a  Musculoskeletal: Strength & Muscle Tone: within normal limits Gait & Station: normal, walks with cane Patient leans: N/A  Psychiatric Specialty Exam: HPI  Review of Systems  Constitutional: Negative for fever and chills.  HENT: Negative for congestion, nosebleeds and tinnitus.   Eyes: Negative for blurred vision, double vision and pain.  Respiratory: Negative for cough, shortness of breath and wheezing.   Cardiovascular: Negative for chest pain, palpitations and leg swelling.  Gastrointestinal: Positive for heartburn. Negative for nausea, vomiting and abdominal pain.  Musculoskeletal: Positive for joint pain. Negative for back pain and neck pain.  Skin: Negative for itching and rash.  Neurological: Negative for dizziness, tremors, seizures, loss of consciousness, weakness and headaches.  Psychiatric/Behavioral: Negative for depression, suicidal ideas, hallucinations and substance abuse. The patient is not nervous/anxious and does not have insomnia.     Blood pressure 144/82, pulse 80, height 5' 9.5" (1.765 m), weight 263 lb 6.4 oz (119.477 kg).Body mass index is 38.35 kg/(m^2).  General Appearance: Casual  Eye Contact:  Fair  Speech:  Clear and Coherent and Normal Rate  Volume:  Normal  Mood:  Euthymic  Affect:  Blunt  Thought Process:  Goal Directed  Orientation:  Full (Time, Place, and Person)  Thought  Content:  Negative  Suicidal Thoughts:  No  Homicidal Thoughts:  No  Memory:  Immediate;   Good Recent;   Good Remote;   Good  Judgement:  Good  Insight:  Good  Psychomotor Activity:  Normal  Concentration:  Good  Recall:  Good  Fund of Knowledge:Good  Language: Good  Akathisia:  No  Handed:  Right  AIMS (if indicated):  AIMS:  Facial and Oral Movements  Muscles of Facial Expression: None, normal  Lips and Perioral Area: None, normal  Jaw: None, normal  Tongue: None, normal Extremity Movements: Upper (arms, wrists, hands, fingers): None, normal  Lower (legs, knees, ankles, toes): None, normal,  Trunk Movements:  Neck, shoulders, hips: None, normal,  Overall Severity : Severity of abnormal movements (highest score from questions above): None, normal  Incapacitation due to abnormal movements: None, normal  Patient's awareness of abnormal movements (rate only patient's report): No Awareness, Dental Status  Current problems with teeth and/or dentures?: No  Does patient usually wear dentures?: No     Assets:  Communication Skills Desire for Improvement Housing Talents/Skills Transportation Vocational/Educational  ADL's:  Intact  Cognition: WNL  Sleep:  fair   Is the patient at risk to self?  No. Has the patient been a risk to self in the past 6 months?  No. Has the patient been a risk to self within the distant past?  No. Is the patient a risk to others?  No. Has the patient been a risk to others in the past 6 months?  No. Has the patient been a risk to others within the distant past?  No.  Allergies:   Allergies  Allergen Reactions  . Ace Inhibitors Anaphylaxis  . Travatan [Travoprost] Other (See Comments)    Red eyes, irritation    Current Medications: Current Outpatient Prescriptions  Medication Sig Dispense Refill  . amLODipine (NORVASC) 5 MG tablet Take 5 mg by mouth every morning.     . ARIPiprazole (ABILIFY) 30 MG tablet Take 30 mg by mouth every other  day.     [name] aspirin EC 81 MG tablet Take 162 mg by mouth daily.    [name] atorvastatin (LIPITOR) 20 MG tablet Take 1 tablet by mouth daily.    [name] buPROPion (WELLBUTRIN XL) 300 MG 24 hr tablet Take 300 mg by mouth every morning.     . Calcium Carbonate (CALCIUM 600 PO) Take 1 tablet by mouth daily.    . DULoxetine (CYMBALTA) 60 MG capsule Take 60 mg by mouth every morning.     [name] esomeprazole (NEXIUM) 40  MG capsule Take 40 mg by mouth daily before breakfast.    . folic acid (FOLVITE) 1 MG tablet Take 1 mg by mouth daily.    [name] glucosamine-chondroitin 500-400 MG tablet Take 2 tablets by mouth every morning.    . leflunomide (ARAVA) 20 MG tablet Take 1 tablet by mouth daily.    [name] levobunolol (BETAGAN) 0.5 % ophthalmic solution Place 1 drop into both eyes every morning.     . methotrexate 50 MG/2ML injection Inject 1 mL into the skin once a week. Wednesday  3  . metroNIDAZOLE (FLAGYL) 500 MG tablet Take 1 tablet (500 mg total) by mouth 2 (two) times daily. 14 tablet 0  . Multiple Vitamin (MULTIVITAMIN WITH MINERALS) TABS tablet Take 1 tablet by mouth daily.    [name] NIFEdipine (PROCARDIA-XL/ADALAT CC) 60 MG 24 hr tablet Take 1 tablet (60 mg total) by mouth daily. 30 tablet 1  . oxyCODONE-acetaminophen (PERCOCET) 10-650 MG per tablet Take 1 tablet by mouth every 8 (eight) hours as needed for pain.    [name] QUEtiapine (SEROQUEL) 300 MG tablet Take 600 mg by mouth at bedtime.  12  . verapamil (CALAN-SR) 180 MG CR tablet Take 1 tablet (180 mg total) by mouth daily. 30 tablet 1  . ciprofloxacin (CIPRO) 500 MG tablet Take 1 tablet (500 mg total) by mouth 2 (two) times daily. (Patient not taking: Reported on 04/10/2015) 14 tablet 0   No current facility-administered medications for this visit.    Previous Psychotropic Medications: Yes   Substance Abuse History in the last 12 months:  No.  Consequences of Substance Abuse: Negative  Medical Decision Making:  Established Problem, Stable/Improving (1), Review of  Psycho-Social Stressors (1), Review or order clinical lab tests (1) and Review of Medication Regimen & Side Effects (2)  Treatment Plan Summary: Medication management and Plan see below    Assessment: Schizoaffective- depressive type- currently stable; OCD by hx;    Medication management with supportive therapy. Risks/benefits and SE of the medication discussed. Pt verbalized understanding and verbal consent obtained for treatment.  Affirm with the patient that the medications are taken as ordered. Patient expressed understanding of how their medications were to be used.  Meds: Seroquel 600mg  po qD for schizoaffective disorder Abilify 30mg  po qD for schizoaffective disorder Cymbalta 60mg  po qD for mood and OCD Wellbutrin XL 300mg  po qD for mood Pt reports she is unstable with monoantipsychotic therapy. She has been on this regime for many years and quality of life is good. Symptoms are stable. Benefit outweights the risk.   Labs: 10/22/2014:Hb 11.9, platelets 128, Creat 1.48, BUN 28, Glu 119, AST 68  QTc 402  Therapy: brief supportive therapy provided. Discussed psychosocial stressors in detail.    Consultations:  Encouraged to continue individual therapy  Pt denies SI and is at an acute low risk for suicide. Patient told to call clinic if any problems occur. Patient advised to go to ER if they should develop SI/HI, side effects, or if symptoms worsen. Has crisis numbers to call if needed. Pt verbalized understanding.  F/up in 3 months or sooner if needed     Antasia Haider 12/22/20169:21 AM

## 2015-05-30 ENCOUNTER — Ambulatory Visit (HOSPITAL_COMMUNITY)
Admission: RE | Admit: 2015-05-30 | Discharge: 2015-05-30 | Disposition: A | Discharge status: Home / Self Care | Payer: Medicare Other | Source: Ambulatory Visit | Attending: Urology | Admitting: Urology

## 2015-05-30 ENCOUNTER — Other Ambulatory Visit (HOSPITAL_COMMUNITY): Payer: Self-pay | Admitting: Internal Medicine

## 2015-05-30 DIAGNOSIS — M79605 Pain in left leg: Secondary | ICD-10-CM

## 2015-05-30 DIAGNOSIS — I8310 Varicose veins of unspecified lower extremity with inflammation: Secondary | ICD-10-CM | POA: Insufficient documentation

## 2015-05-30 DIAGNOSIS — I82812 Embolism and thrombosis of superficial veins of left lower extremities: Secondary | ICD-10-CM | POA: Diagnosis not present

## 2015-05-30 DIAGNOSIS — I10 Essential (primary) hypertension: Secondary | ICD-10-CM | POA: Diagnosis not present

## 2015-05-30 DIAGNOSIS — R600 Localized edema: Secondary | ICD-10-CM | POA: Insufficient documentation

## 2015-05-30 DIAGNOSIS — M79604 Pain in right leg: Secondary | ICD-10-CM

## 2015-07-10 ENCOUNTER — Encounter (HOSPITAL_COMMUNITY): Payer: Self-pay | Admitting: Psychiatry

## 2015-07-10 ENCOUNTER — Ambulatory Visit (INDEPENDENT_AMBULATORY_CARE_PROVIDER_SITE_OTHER): Payer: Medicare Other | Admitting: Psychiatry

## 2015-07-10 VITALS — BP 128/82 | HR 76 | Ht 69.0 in | Wt 268.0 lb

## 2015-07-10 DIAGNOSIS — Z79899 Other long term (current) drug therapy: Secondary | ICD-10-CM

## 2015-07-10 DIAGNOSIS — F429 Obsessive-compulsive disorder, unspecified: Secondary | ICD-10-CM

## 2015-07-10 DIAGNOSIS — F251 Schizoaffective disorder, depressive type: Secondary | ICD-10-CM

## 2015-07-10 MED ORDER — QUETIAPINE FUMARATE 300 MG PO TABS: 600.0000 mg | ORAL_TABLET | Freq: Every day | ORAL | Status: DC

## 2015-07-10 MED ORDER — DULOXETINE HCL 60 MG PO CPEP: 60.0000 mg | ORAL_CAPSULE | Freq: Every morning | ORAL | Status: DC

## 2015-07-10 MED ORDER — ARIPIPRAZOLE 30 MG PO TABS: 30.0000 mg | ORAL_TABLET | Freq: Every day | ORAL | Status: DC

## 2015-07-10 MED ORDER — BUPROPION HCL ER (XL) 300 MG PO TB24: 300.0000 mg | ORAL_TABLET | Freq: Every morning | ORAL | Status: DC

## 2015-07-10 NOTE — Patient Instructions (Signed)
EKG call [phone] 
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
## 2015-08-04 ENCOUNTER — Emergency Department (HOSPITAL_COMMUNITY)
Admission: EM | Admit: 2015-08-04 | Discharge: 2015-08-05 | Disposition: A | Discharge status: Home / Self Care | Payer: Medicare Other | Attending: Emergency Medicine | Admitting: Emergency Medicine

## 2015-08-04 ENCOUNTER — Emergency Department (HOSPITAL_COMMUNITY): Payer: Medicare Other

## 2015-08-04 ENCOUNTER — Encounter (HOSPITAL_COMMUNITY): Payer: Self-pay | Admitting: *Deleted

## 2015-08-04 DIAGNOSIS — F17210 Nicotine dependence, cigarettes, uncomplicated: Secondary | ICD-10-CM | POA: Insufficient documentation

## 2015-08-04 DIAGNOSIS — R109 Unspecified abdominal pain: Secondary | ICD-10-CM

## 2015-08-04 DIAGNOSIS — H409 Unspecified glaucoma: Secondary | ICD-10-CM | POA: Insufficient documentation

## 2015-08-04 DIAGNOSIS — K2970 Gastritis, unspecified, without bleeding: Secondary | ICD-10-CM | POA: Insufficient documentation

## 2015-08-04 DIAGNOSIS — Z792 Long term (current) use of antibiotics: Secondary | ICD-10-CM | POA: Diagnosis not present

## 2015-08-04 DIAGNOSIS — Z7982 Long term (current) use of aspirin: Secondary | ICD-10-CM | POA: Diagnosis not present

## 2015-08-04 DIAGNOSIS — R1030 Lower abdominal pain, unspecified: Secondary | ICD-10-CM | POA: Diagnosis present

## 2015-08-04 DIAGNOSIS — G4730 Sleep apnea, unspecified: Secondary | ICD-10-CM | POA: Diagnosis not present

## 2015-08-04 DIAGNOSIS — R197 Diarrhea, unspecified: Secondary | ICD-10-CM | POA: Insufficient documentation

## 2015-08-04 DIAGNOSIS — Z9981 Dependence on supplemental oxygen: Secondary | ICD-10-CM | POA: Diagnosis not present

## 2015-08-04 DIAGNOSIS — Z872 Personal history of diseases of the skin and subcutaneous tissue: Secondary | ICD-10-CM | POA: Diagnosis not present

## 2015-08-04 DIAGNOSIS — F259 Schizoaffective disorder, unspecified: Secondary | ICD-10-CM | POA: Diagnosis not present

## 2015-08-04 DIAGNOSIS — F429 Obsessive-compulsive disorder, unspecified: Secondary | ICD-10-CM | POA: Diagnosis not present

## 2015-08-04 DIAGNOSIS — K921 Melena: Secondary | ICD-10-CM | POA: Diagnosis not present

## 2015-08-04 DIAGNOSIS — F329 Major depressive disorder, single episode, unspecified: Secondary | ICD-10-CM | POA: Diagnosis not present

## 2015-08-04 DIAGNOSIS — Z8639 Personal history of other endocrine, nutritional and metabolic disease: Secondary | ICD-10-CM | POA: Diagnosis not present

## 2015-08-04 DIAGNOSIS — M1990 Unspecified osteoarthritis, unspecified site: Secondary | ICD-10-CM | POA: Diagnosis not present

## 2015-08-04 DIAGNOSIS — R1032 Left lower quadrant pain: Secondary | ICD-10-CM | POA: Diagnosis not present

## 2015-08-04 DIAGNOSIS — R1012 Left upper quadrant pain: Secondary | ICD-10-CM | POA: Diagnosis not present

## 2015-08-04 DIAGNOSIS — I10 Essential (primary) hypertension: Secondary | ICD-10-CM | POA: Insufficient documentation

## 2015-08-04 DIAGNOSIS — Z79899 Other long term (current) drug therapy: Secondary | ICD-10-CM | POA: Diagnosis not present

## 2015-08-04 DIAGNOSIS — R011 Cardiac murmur, unspecified: Secondary | ICD-10-CM | POA: Insufficient documentation

## 2015-08-04 DIAGNOSIS — Z862 Personal history of diseases of the blood and blood-forming organs and certain disorders involving the immune mechanism: Secondary | ICD-10-CM | POA: Insufficient documentation

## 2015-08-04 DIAGNOSIS — Z86718 Personal history of other venous thrombosis and embolism: Secondary | ICD-10-CM | POA: Insufficient documentation

## 2015-08-04 LAB — CBC
HCT: 36.4 % (ref 36.0–46.0)
Hemoglobin: 12.6 g/dL (ref 12.0–15.0)
MCH: 35 pg — AB (ref 26.0–34.0)
MCHC: 34.6 g/dL (ref 30.0–36.0)
MCV: 101.1 fL — ABNORMAL HIGH (ref 78.0–100.0)
PLATELETS: 197 10*3/uL (ref 150–400)
RBC: 3.6 MIL/uL — ABNORMAL LOW (ref 3.87–5.11)
RDW: 13.4 % (ref 11.5–15.5)
WBC: 6.4 10*3/uL (ref 4.0–10.5)

## 2015-08-04 LAB — COMPREHENSIVE METABOLIC PANEL
ALBUMIN: 4 g/dL (ref 3.5–5.0)
ALT: 77 U/L — AB (ref 14–54)
AST: 37 U/L (ref 15–41)
Alkaline Phosphatase: 130 U/L — ABNORMAL HIGH (ref 38–126)
Anion gap: 11 (ref 5–15)
BUN: 18 mg/dL (ref 6–20)
CO2: 23 mmol/L (ref 22–32)
CREATININE: 1.37 mg/dL — AB (ref 0.44–1.00)
Calcium: 9.8 mg/dL (ref 8.9–10.3)
Chloride: 105 mmol/L (ref 101–111)
GFR calc Af Amer: 49 mL/min — ABNORMAL LOW (ref >60)
GFR, EST NON AFRICAN AMERICAN: 43 mL/min — AB (ref >60)
GLUCOSE: 89 mg/dL (ref 65–99)
POTASSIUM: 4.4 mmol/L (ref 3.5–5.1)
SODIUM: 139 mmol/L (ref 135–145)
Total Bilirubin: 0.7 mg/dL (ref 0.3–1.2)
Total Protein: 7.8 g/dL (ref 6.5–8.1)

## 2015-08-04 LAB — TYPE AND SCREEN
ABO/RH(D): B POS
Antibody Screen: NEGATIVE

## 2015-08-04 LAB — POC OCCULT BLOOD, ED: Fecal Occult Bld: NEGATIVE

## 2015-08-04 LAB — ABO/RH: ABO/RH(D): B POS

## 2015-08-04 LAB — LIPASE, BLOOD: LIPASE: 42 U/L (ref 11–51)

## 2015-08-04 MED ORDER — ACETAMINOPHEN 325 MG PO TABS
650.0000 mg | ORAL_TABLET | Freq: Once | ORAL | Status: AC
  Administered 2015-08-04: 650 mg via ORAL
  Filled 2015-08-04: qty 2

## 2015-08-04 MED ORDER — PANTOPRAZOLE SODIUM 40 MG IV SOLR
40.0000 mg | INTRAVENOUS | Status: AC
  Administered 2015-08-04: 40 mg via INTRAVENOUS
  Filled 2015-08-04: qty 40

## 2015-08-04 MED ORDER — SODIUM CHLORIDE 0.9 % IV BOLUS (SEPSIS)
1000.0000 mL | Freq: Once | INTRAVENOUS | Status: AC
  Administered 2015-08-04: 1000 mL via INTRAVENOUS

## 2015-08-04 MED ORDER — HYDROMORPHONE HCL 1 MG/ML IJ SOLN
1.0000 mg | Freq: Once | INTRAMUSCULAR | Status: DC
  Filled 2015-08-04: qty 1

## 2015-08-04 MED ORDER — IOPAMIDOL (ISOVUE-300) INJECTION 61%
INTRAVENOUS | Status: AC
  Administered 2015-08-04: 100 mL
  Filled 2015-08-04: qty 100

## 2015-08-04 NOTE — ED Provider Notes (Signed)
CSN: [id number]     Arrival date & time 08/04/15  1607 History   First MD Initiated Contact with Patient 08/04/15 2135     Chief Complaint  Patient presents with  . Abdominal Pain  . Blood In Stools    HPI   Jeanne [name] is a 55 y.o. female with a PMH of HTN who presents to the ED with lower abdominal cramping and black tarry diarrhea x 2 days. She reports her symptoms have been constant. She denies exacerbating or alleviating factors. She denies history of similar symptoms. She states her pain is 7/10. She denies fever, chills, chest pain, shortness of breath, nausea, vomiting, dysuria, urgency, frequency. She denies anticoagulant use.   Past Medical History  Diagnosis Date  . Hypertension   . Glaucoma   . Gastritis   . Schizo-affective psychosis ([place])   . Depression   . DVT (deep venous thrombosis) ([place]) 2014    LEFT LEG  . Heart murmur   . Hypokalemia 03/05/2013  . Neuromuscular disorder ([place])     " NEUROPATHY IN  MY HANDS"  . Arthritis   . Sleep apnea     Bipap  . Scleritis of both eyes   . 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 Clotting disorder ([place])   . Antiphospholipid antibody syndrome ([place])   . Suppurative hidradenitis   . Difficult intubation 07/09/2013    Glidescope used, see Anesthesia note  . Carpal tunnel syndrome 01/02/2014    Bilateral  . Obsessive-compulsive disorder    Past Surgical History  Procedure Laterality Date  . Cervical conization w/bx    . Bunionectomy    . Lymph node dissection    . Laparoscopic adrenalectomy Left 07/09/2013    Procedure: LAPAROSCOPIC LEFT  ADRENALECTOMY;  Surgeon: Earnstine Regal, MD;  Location: [facility];  Service: General;  Laterality: Left;   Family History  Problem Relation Age of Onset  . Cancer Maternal Aunt     breast ca  . Heart attack Maternal Grandmother 54  . Heart attack Maternal Grandfather 62  . Hypertension Sister   . Hypertension Sister   . Seizures Maternal Uncle   . Alcohol abuse Neg Hx   . Anxiety disorder Neg  Hx   . Bipolar disorder Neg Hx   . Depression Neg Hx   . Drug abuse Neg Hx   . Schizophrenia Neg Hx    Social History  Substance Use Topics  . Smoking status: Current Every Day Smoker -- 0.50 packs/day for 25 years    Types: Cigarettes  . Smokeless tobacco: Never Used  . Alcohol Use: No     Comment: " drink just on weekends " Last drink was in the spring of 2016   OB History    No data available      Review of Systems  Constitutional: Negative for fever and chills.  Respiratory: Negative for shortness of breath.   Cardiovascular: Negative for chest pain.  Gastrointestinal: Positive for abdominal pain, diarrhea and blood in stool. Negative for nausea and vomiting.  Genitourinary: Negative for dysuria, urgency and frequency.  All other systems reviewed and are negative.     Allergies  Ace inhibitors; Arava; and Travatan  Home Medications   Prior to Admission medications   Medication Sig Start Date End Date Taking? Authorizing Provider  amLODipine (NORVASC) 5 MG tablet Take 5 mg by mouth every morning.    Yes Historical Provider, MD  ARIPiprazole (ABILIFY) 30 MG tablet Take 1 tablet (30 mg total)  by mouth daily. 07/10/15  Yes Charlcie Cradle, MD  aspirin EC 81 MG tablet Take 162 mg by mouth daily.   Yes Historical Provider, MD  atorvastatin (LIPITOR) 20 MG tablet Take 1 tablet by mouth daily. 10/22/14  Yes Historical Provider, MD  buPROPion (WELLBUTRIN XL) 300 MG 24 hr tablet Take 1 tablet (300 mg total) by mouth every morning. 07/10/15  Yes Charlcie Cradle, MD  Calcium Carb-Cholecalciferol 600-800 MG-UNIT TABS Take 1 tablet by mouth daily.   Yes Historical Provider, MD  DULoxetine (CYMBALTA) 60 MG capsule Take 1 capsule (60 mg total) by mouth every morning. 07/10/15  Yes Charlcie Cradle, MD  esomeprazole (NEXIUM) 40 MG capsule Take 40 mg by mouth daily before breakfast.   Yes Historical Provider, MD  folic acid (FOLVITE) 1 MG tablet Take 1 mg by mouth daily.   Yes Historical  Provider, MD  glucosamine-chondroitin 500-400 MG tablet Take 2 tablets by mouth every morning.   Yes Historical Provider, MD  Javier Docker Oil 300 MG CAPS Take 300 mg by mouth daily.   Yes Historical Provider, MD  levobunolol (BETAGAN) 0.5 % ophthalmic solution Place 1 drop into both eyes every morning.    Yes Historical Provider, MD  lubiprostone (AMITIZA) 24 MCG capsule Take 24 mcg by mouth 2 (two) times daily with a meal.   Yes Historical Provider, MD  methotrexate 50 MG/2ML injection Inject 1 mL into the skin every Monday.  09/17/14  Yes Historical Provider, MD  Multiple Vitamin (MULTIVITAMIN WITH MINERALS) TABS tablet Take 1 tablet by mouth daily.   Yes Historical Provider, MD  NIFEdipine (PROCARDIA-XL/ADALAT CC) 60 MG 24 hr tablet Take 1 tablet (60 mg total) by mouth daily. 07/16/13  Yes Armandina Gemma, MD  oxyCODONE-acetaminophen (PERCOCET/ROXICET) 5-325 MG tablet Take 1-2 tablets by mouth every 4 (four) hours as needed for severe pain.   Yes Historical Provider, MD  PRESCRIPTION MEDICATION Inhale 1 application into the lungs at bedtime. BiPAP setting 8/12   Yes Historical Provider, MD  QUEtiapine (SEROQUEL) 300 MG tablet Take 2 tablets (600 mg total) by mouth at bedtime. 07/10/15  Yes Charlcie Cradle, MD  verapamil (CALAN-SR) 180 MG CR tablet Take 1 tablet (180 mg total) by mouth daily. 07/16/13  Yes Armandina Gemma, MD  ciprofloxacin (CIPRO) 500 MG tablet Take 1 tablet (500 mg total) by mouth 2 (two) times daily. Patient not taking: Reported on 07/10/2015 10/22/14   Varney Biles, MD  leflunomide (ARAVA) 20 MG tablet Take 1 tablet by mouth daily. Reported on 07/10/2015 10/22/14   Historical Provider, MD  metroNIDAZOLE (FLAGYL) 500 MG tablet Take 1 tablet (500 mg total) by mouth 2 (two) times daily. 10/22/14   Varney Biles, MD  oxyCODONE-acetaminophen (PERCOCET) 10-650 MG per tablet Take 1 tablet by mouth every 8 (eight) hours as needed for pain.    Historical Provider, MD    BP 115/61 mmHg  Pulse 61  Temp(Src)  98 F (36.7 C) (Oral)  Resp 20  SpO2 99% Physical Exam  Constitutional: She is oriented to person, place, and time. She appears well-developed and well-nourished. No distress.  HENT:  Head: Normocephalic and atraumatic.  Right Ear: External ear normal.  Left Ear: External ear normal.  Nose: Nose normal.  Mouth/Throat: Uvula is midline, oropharynx is clear and moist and mucous membranes are normal.  Eyes: Conjunctivae, EOM and lids are normal. Pupils are equal, round, and reactive to light. Right eye exhibits no discharge. Left eye exhibits no discharge. No scleral icterus.  Neck: Normal range  of motion. Neck supple.  Cardiovascular: Normal rate, regular rhythm, normal heart sounds, intact distal pulses and normal pulses.   Pulmonary/Chest: Effort normal and breath sounds normal. No respiratory distress. She has no wheezes. She has no rales.  Abdominal: Soft. Normal appearance and bowel sounds are normal. She exhibits no distension and no mass. There is tenderness. There is no rigidity, no rebound and no guarding.  TTP in LUQ and LLQ.  Genitourinary: Rectal exam shows no external hemorrhoid, no internal hemorrhoid, no fissure, no mass and no tenderness. Guaiac negative stool.  No gross blood or melanotic stool in rectal vault.  Musculoskeletal: Normal range of motion. She exhibits no edema or tenderness.  Neurological: She is alert and oriented to person, place, and time. She has normal strength. No cranial nerve deficit or sensory deficit.  Skin: Skin is warm, dry and intact. No rash noted. She is not diaphoretic. No erythema. No pallor.  Psychiatric: She has a normal mood and affect. Her speech is normal and behavior is normal.  Nursing note and vitals reviewed.   ED Course  Procedures (including critical care time)  Labs Review Labs Reviewed  COMPREHENSIVE METABOLIC PANEL - Abnormal; Notable for the following:    Creatinine, Ser 1.37 (*)    ALT 77 (*)    Alkaline Phosphatase 130  (*)    GFR calc non Af Amer 43 (*)    GFR calc Af Amer 49 (*)    All other components within normal limits  CBC - Abnormal; Notable for the following:    RBC 3.60 (*)    MCV 101.1 (*)    MCH 35.0 (*)    All other components within normal limits  LIPASE, BLOOD  URINALYSIS, ROUTINE W REFLEX MICROSCOPIC (NOT AT [facility])  POC OCCULT BLOOD, ED  POC OCCULT BLOOD, ED  TYPE AND SCREEN  ABO/RH    Imaging Review Ct Abdomen Pelvis W Contrast  08/04/2015  CLINICAL DATA:  Left-sided abdominal pain for 2 days. Distended abdomen. Dark tarry stools and abdominal cramping today. EXAM: CT ABDOMEN AND PELVIS WITH CONTRAST TECHNIQUE: Multidetector CT imaging of the abdomen and pelvis was performed using the standard protocol following bolus administration of intravenous contrast. CONTRAST:  160mL ISOVUE-300 IOPAMIDOL (ISOVUE-300) INJECTION 61% COMPARISON:  03/08/2013 FINDINGS: Atelectasis in the lung bases. The liver, spleen, gallbladder, pancreas, kidneys, abdominal aorta, inferior vena cava, and retroperitoneal lymph nodes are unremarkable. Surgical absence of the left adrenal gland. Right adrenal gland appears unremarkable. Stomach, small bowel, and colon are mostly decompressed. No free air or free fluid in the abdomen. Small periumbilical hernia containing fat. Pelvis: Appendix is normal. Uterus and ovaries are not enlarged. Small amount of free fluid in the pelvis is most likely to be physiologic. No loculated fluid collections. Mild bladder wall thickening may indicate cystitis. Mild degenerative changes in the spine. No destructive bone lesions. Sclerosis in the SI joints bilaterally suggesting sacral ileitis. Degenerative changes in the hips, more prominent on the left hip, with prominent subcortical cysts on both sides of the joint. IMPRESSION: Mild bladder wall thickening may indicate cystitis. No evidence of bowel obstruction or inflammation. Prominent asymmetric degenerative changes in the left hip.  Electronically Signed   By: Lucienne Capers M.D.   On: 08/04/2015 23:50   I have personally reviewed and evaluated these images and lab results as part of my medical decision-making.   EKG Interpretation None      MDM   Final diagnoses:  Abdominal pain    55 year old  female presents with lower abdominal cramping and black tarry stools for the past 2 days. Denies fever, chills, nausea, vomiting. States she has never experienced these symptoms in the past. Denies anticoagulant use (though takes ASA daily).  Patient is afebrile. Vital signs stable. Heart regular rate and rhythm. Lungs clear to auscultation bilaterally. Abdomen soft, nondistended, with tenderness to palpation in left upper and left lower quadrants. No rebound, guarding, or masses.   CBC negative for leukocytosis or anemia. CMP remarkable for creatinine 1.37, ALT 77, alkaline phosphatase 130. Lipase within normal limits. Hemoccult negative. Given patient reports abdominal pain and has TTP on exam, will obtain CT abdomen pelvis. CT abdomen pelvis remarkable for mild bladder wall thickening, which may indicate cystitis, no evidence of bowel obstruction or inflammation. UA pending. UA negative for infection.  Patient discussed with Dr. Vanita Panda. On reassessment, she reports significant symptom improvement. She is nontoxic and well-appearing, feel she is stable for discharge at this time given stable hemoglobin and reassuring imaging. Advised to continue taking nexium at home. Patient to follow-up with GI for further evaluation and management. Strict return precautions discussed. Patient verbalizes her understanding and is in agreement with plan.  BP 115/61 mmHg  Pulse 61  Temp(Src) 98 F (36.7 C) (Oral)  Resp 20  SpO2 99%   Marella Chimes, PA-C 08/05/15 0045  Carmin Muskrat, MD 08/05/15 [phone]

## 2015-08-04 NOTE — ED Notes (Signed)
Pt reports dark tarry stools x 2 days and abd cramping today. Denies vomiting. No acute distress noted at triage.

## 2015-08-04 NOTE — ED Notes (Signed)
Spoke to lab.  Will add lipase.

## 2015-08-05 LAB — URINALYSIS, ROUTINE W REFLEX MICROSCOPIC
BILIRUBIN URINE: NEGATIVE
GLUCOSE, UA: NEGATIVE mg/dL
Hgb urine dipstick: NEGATIVE
KETONES UR: NEGATIVE mg/dL
LEUKOCYTES UA: NEGATIVE
NITRITE: NEGATIVE
PROTEIN: NEGATIVE mg/dL
Specific Gravity, Urine: 1.017 (ref 1.005–1.030)
pH: 5.5 (ref 5.0–8.0)

## 2015-08-05 NOTE — ED Notes (Signed)
Patient able to ambulate independently  

## 2015-08-05 NOTE — Discharge Instructions (Signed)
1. Medications: usual home medications 2. Treatment: rest, drink plenty of fluids 3. Follow Up: please followup with your PCP and with your GI doctor for discussion of your diagnoses and further evaluation after today's visit; if you do not have a primary care doctor use the phone number listed in your discharge paperwork to find one; please return to the ER for severe pain, persistent bleeding, new or worsening symptoms   Abdominal Pain, Adult Many things can cause belly (abdominal) pain. Most times, the belly pain is not dangerous. Many cases of belly pain can be watched and treated at home. HOME CARE   Do not take medicines that help you go poop (laxatives) unless told to by your doctor.  Only take medicine as told by your doctor.  Eat or drink as told by your doctor. Your doctor will tell you if you should be on a special diet. GET HELP IF:  You do not know what is causing your belly pain.  You have belly pain while you are sick to your stomach (nauseous) or have runny poop (diarrhea).  You have pain while you pee or poop.  Your belly pain wakes you up at night.  You have belly pain that gets worse or better when you eat.  You have belly pain that gets worse when you eat fatty foods.  You have a fever. GET HELP RIGHT AWAY IF:   The pain does not go away within 2 hours.  You keep throwing up (vomiting).  The pain changes and is only in the right or left part of the belly.  You have bloody or tarry looking poop. MAKE SURE YOU:   Understand these instructions.  Will watch your condition.  Will get help right away if you are not doing well or get worse.   This information is not intended to replace advice given to you by your health care provider. Make sure you discuss any questions you have with your health care provider.   Document Released: 09/22/2007 Document Revised: 04/26/2014 Document Reviewed: 12/13/2012 Elsevier Interactive Patient Education [employer].

## 2015-08-05 NOTE — ED Notes (Signed)
Pt up to restroom.  Gait steady and even.   

## 2015-08-08 ENCOUNTER — Ambulatory Visit: Payer: Self-pay | Admitting: Gastroenterology

## 2015-09-24 ENCOUNTER — Ambulatory Visit (HOSPITAL_COMMUNITY)
Admission: RE | Admit: 2015-09-24 | Discharge: 2015-09-24 | Disposition: A | Discharge status: Home / Self Care | Payer: Medicare Other | Source: Ambulatory Visit | Attending: Psychiatry | Admitting: Psychiatry

## 2015-09-24 DIAGNOSIS — Z79899 Other long term (current) drug therapy: Secondary | ICD-10-CM | POA: Diagnosis not present

## 2015-09-24 DIAGNOSIS — Z5181 Encounter for therapeutic drug level monitoring: Secondary | ICD-10-CM | POA: Diagnosis present

## 2015-10-16 ENCOUNTER — Ambulatory Visit (INDEPENDENT_AMBULATORY_CARE_PROVIDER_SITE_OTHER): Payer: Medicare Other | Admitting: Psychiatry

## 2015-10-16 ENCOUNTER — Encounter (HOSPITAL_COMMUNITY): Payer: Self-pay | Admitting: Psychiatry

## 2015-10-16 VITALS — BP 120/74 | HR 78 | Ht 69.0 in | Wt 251.6 lb

## 2015-10-16 DIAGNOSIS — F251 Schizoaffective disorder, depressive type: Secondary | ICD-10-CM | POA: Diagnosis not present

## 2015-10-16 DIAGNOSIS — F429 Obsessive-compulsive disorder, unspecified: Secondary | ICD-10-CM | POA: Diagnosis not present

## 2015-10-16 MED ORDER — BUPROPION HCL ER (XL) 300 MG PO TB24: 300.0000 mg | ORAL_TABLET | Freq: Every morning | ORAL | Status: DC

## 2015-10-16 MED ORDER — DULOXETINE HCL 60 MG PO CPEP: 60.0000 mg | ORAL_CAPSULE | Freq: Every morning | ORAL | Status: DC

## 2015-10-16 MED ORDER — QUETIAPINE FUMARATE 300 MG PO TABS: 600.0000 mg | ORAL_TABLET | Freq: Every day | ORAL | Status: DC

## 2015-10-16 MED ORDER — ARIPIPRAZOLE 30 MG PO TABS: 30.0000 mg | ORAL_TABLET | Freq: Every day | ORAL | Status: DC
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
## 2015-11-27 ENCOUNTER — Telehealth (HOSPITAL_COMMUNITY): Payer: Self-pay

## 2015-11-27 DIAGNOSIS — F429 Obsessive-compulsive disorder, unspecified: Secondary | ICD-10-CM

## 2015-11-27 DIAGNOSIS — F251 Schizoaffective disorder, depressive type: Secondary | ICD-10-CM

## 2015-11-27 NOTE — Telephone Encounter (Signed)
Patient is calling states her depression is increasing and she does not see you until next month on the 28th. Patient would like to know if you can increase something that she is currently on, or try something else. Please advise, thank you

## 2015-12-02 MED ORDER — DULOXETINE HCL 30 MG PO CPEP: 90.0000 mg | ORAL_CAPSULE | Freq: Every morning | ORAL | 0 refills | Status: DC

## 2015-12-02 NOTE — Telephone Encounter (Signed)
Assess for safety. If safe then increase Cymbalta to 90mg  po qD and have pt call in a few weeks with update.

## 2015-12-02 NOTE — Telephone Encounter (Signed)
I called and spoke with patient, she is not in crisis...just feeling more depressed. I let her know about the dose change and told her to call back in a couple of weeks to let us know how she is doing

## 2015-12-19 ENCOUNTER — Telehealth (HOSPITAL_COMMUNITY): Payer: Self-pay

## 2015-12-19 NOTE — Telephone Encounter (Signed)
Levada Dy with [facility] called and left voicemail that patients Cymbalta was approved, approval number is [id number]. I called pharmacy and let them know and called patient to let her know as well.

## 2016-01-15 ENCOUNTER — Encounter (HOSPITAL_COMMUNITY): Payer: Self-pay | Admitting: Psychiatry

## 2016-01-15 ENCOUNTER — Other Ambulatory Visit (HOSPITAL_COMMUNITY): Payer: Self-pay | Admitting: Psychiatry

## 2016-01-15 ENCOUNTER — Ambulatory Visit (INDEPENDENT_AMBULATORY_CARE_PROVIDER_SITE_OTHER): Payer: Medicare Other | Admitting: Psychiatry

## 2016-01-15 DIAGNOSIS — F251 Schizoaffective disorder, depressive type: Secondary | ICD-10-CM

## 2016-01-15 DIAGNOSIS — F429 Obsessive-compulsive disorder, unspecified: Secondary | ICD-10-CM | POA: Diagnosis not present

## 2016-01-15 MED ORDER — ARIPIPRAZOLE 30 MG PO TABS: 30.0000 mg | ORAL_TABLET | Freq: Every day | ORAL | 0 refills | Status: DC

## 2016-01-15 MED ORDER — BUPROPION HCL ER (XL) 300 MG PO TB24: 300.0000 mg | ORAL_TABLET | Freq: Every morning | ORAL | 0 refills | Status: DC

## 2016-01-15 MED ORDER — QUETIAPINE FUMARATE 300 MG PO TABS: 600.0000 mg | ORAL_TABLET | Freq: Every day | ORAL | 0 refills | Status: DC

## 2016-01-15 MED ORDER — DULOXETINE HCL 30 MG PO CPEP: 90.0000 mg | ORAL_CAPSULE | Freq: Every morning | ORAL | 0 refills | Status: DC
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
## 2016-02-25 ENCOUNTER — Other Ambulatory Visit (HOSPITAL_COMMUNITY): Payer: Self-pay | Admitting: Psychiatry

## 2016-02-25 DIAGNOSIS — F251 Schizoaffective disorder, depressive type: Secondary | ICD-10-CM

## 2016-02-25 DIAGNOSIS — F429 Obsessive-compulsive disorder, unspecified: Secondary | ICD-10-CM

## 2016-03-02 ENCOUNTER — Ambulatory Visit: Payer: Medicare Other | Admitting: Sports Medicine

## 2016-03-08 ENCOUNTER — Ambulatory Visit: Payer: Medicare Other | Admitting: Podiatry

## 2016-03-18 ENCOUNTER — Other Ambulatory Visit (HOSPITAL_COMMUNITY): Payer: Self-pay | Admitting: Psychiatry

## 2016-03-18 DIAGNOSIS — F251 Schizoaffective disorder, depressive type: Secondary | ICD-10-CM

## 2016-04-15 ENCOUNTER — Other Ambulatory Visit (HOSPITAL_COMMUNITY): Payer: Self-pay | Admitting: Psychiatry

## 2016-04-15 DIAGNOSIS — F251 Schizoaffective disorder, depressive type: Secondary | ICD-10-CM

## 2016-04-15 DIAGNOSIS — F429 Obsessive-compulsive disorder, unspecified: Secondary | ICD-10-CM

## 2016-04-22 ENCOUNTER — Encounter (HOSPITAL_COMMUNITY): Payer: Self-pay | Admitting: Psychiatry

## 2016-04-22 ENCOUNTER — Ambulatory Visit (INDEPENDENT_AMBULATORY_CARE_PROVIDER_SITE_OTHER): Payer: Medicare Other | Admitting: Psychiatry

## 2016-04-22 DIAGNOSIS — F429 Obsessive-compulsive disorder, unspecified: Secondary | ICD-10-CM | POA: Diagnosis not present

## 2016-04-22 DIAGNOSIS — F251 Schizoaffective disorder, depressive type: Secondary | ICD-10-CM

## 2016-04-22 DIAGNOSIS — Z9889 Other specified postprocedural states: Secondary | ICD-10-CM | POA: Diagnosis not present

## 2016-04-22 DIAGNOSIS — Z803 Family history of malignant neoplasm of breast: Secondary | ICD-10-CM

## 2016-04-22 DIAGNOSIS — Z8249 Family history of ischemic heart disease and other diseases of the circulatory system: Secondary | ICD-10-CM

## 2016-04-22 DIAGNOSIS — F17210 Nicotine dependence, cigarettes, uncomplicated: Secondary | ICD-10-CM

## 2016-04-22 MED ORDER — DULOXETINE HCL 30 MG PO CPEP: ORAL_CAPSULE | ORAL | 0 refills | Status: DC

## 2016-04-22 MED ORDER — BUPROPION HCL ER (XL) 300 MG PO TB24: 300.0000 mg | ORAL_TABLET | Freq: Every morning | ORAL | 0 refills | Status: DC

## 2016-04-22 MED ORDER — QUETIAPINE FUMARATE 300 MG PO TABS: 600.0000 mg | ORAL_TABLET | Freq: Every day | ORAL | 0 refills | Status: DC

## 2016-04-22 MED ORDER — ARIPIPRAZOLE 30 MG PO TABS: 30.0000 mg | ORAL_TABLET | Freq: Every day | ORAL | 0 refills | Status: DC
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
## 2016-06-24 ENCOUNTER — Encounter (HOSPITAL_COMMUNITY): Payer: Self-pay | Admitting: Psychiatry

## 2016-06-24 ENCOUNTER — Ambulatory Visit (INDEPENDENT_AMBULATORY_CARE_PROVIDER_SITE_OTHER): Payer: Medicare Other | Admitting: Psychiatry

## 2016-06-24 DIAGNOSIS — Z79891 Long term (current) use of opiate analgesic: Secondary | ICD-10-CM | POA: Diagnosis not present

## 2016-06-24 DIAGNOSIS — F251 Schizoaffective disorder, depressive type: Secondary | ICD-10-CM

## 2016-06-24 DIAGNOSIS — F17210 Nicotine dependence, cigarettes, uncomplicated: Secondary | ICD-10-CM

## 2016-06-24 DIAGNOSIS — F429 Obsessive-compulsive disorder, unspecified: Secondary | ICD-10-CM | POA: Diagnosis not present

## 2016-06-24 DIAGNOSIS — Z79899 Other long term (current) drug therapy: Secondary | ICD-10-CM | POA: Diagnosis not present

## 2016-06-24 MED ORDER — BUPROPION HCL ER (XL) 300 MG PO TB24: 300.0000 mg | ORAL_TABLET | Freq: Every morning | ORAL | 0 refills | Status: DC

## 2016-06-24 MED ORDER — DULOXETINE HCL 30 MG PO CPEP: ORAL_CAPSULE | ORAL | 0 refills | Status: DC

## 2016-06-24 MED ORDER — QUETIAPINE FUMARATE 300 MG PO TABS: 600.0000 mg | ORAL_TABLET | Freq: Every day | ORAL | 0 refills | Status: DC

## 2016-06-24 MED ORDER — ARIPIPRAZOLE 30 MG PO TABS: 30.0000 mg | ORAL_TABLET | Freq: Every day | ORAL | 0 refills | Status: DC
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
## 2016-07-02 ENCOUNTER — Ambulatory Visit (INDEPENDENT_AMBULATORY_CARE_PROVIDER_SITE_OTHER): Payer: Medicare Other | Admitting: Psychology

## 2016-07-02 DIAGNOSIS — F250 Schizoaffective disorder, bipolar type: Secondary | ICD-10-CM

## 2016-07-23 ENCOUNTER — Ambulatory Visit (INDEPENDENT_AMBULATORY_CARE_PROVIDER_SITE_OTHER): Payer: 59 | Admitting: Psychology

## 2016-07-23 DIAGNOSIS — F250 Schizoaffective disorder, bipolar type: Secondary | ICD-10-CM

## 2016-08-12 ENCOUNTER — Ambulatory Visit (INDEPENDENT_AMBULATORY_CARE_PROVIDER_SITE_OTHER): Payer: 59 | Admitting: Psychology

## 2016-08-12 DIAGNOSIS — F250 Schizoaffective disorder, bipolar type: Secondary | ICD-10-CM | POA: Diagnosis not present

## 2016-08-30 ENCOUNTER — Ambulatory Visit (INDEPENDENT_AMBULATORY_CARE_PROVIDER_SITE_OTHER): Payer: 59 | Admitting: Psychology

## 2016-08-30 DIAGNOSIS — F250 Schizoaffective disorder, bipolar type: Secondary | ICD-10-CM | POA: Diagnosis not present

## 2016-09-20 ENCOUNTER — Ambulatory Visit (INDEPENDENT_AMBULATORY_CARE_PROVIDER_SITE_OTHER): Payer: 59 | Admitting: Psychology

## 2016-09-20 DIAGNOSIS — F250 Schizoaffective disorder, bipolar type: Secondary | ICD-10-CM

## 2016-09-30 ENCOUNTER — Ambulatory Visit (INDEPENDENT_AMBULATORY_CARE_PROVIDER_SITE_OTHER): Payer: 59 | Admitting: Psychiatry

## 2016-09-30 ENCOUNTER — Encounter (HOSPITAL_COMMUNITY): Payer: Self-pay | Admitting: Psychiatry

## 2016-09-30 DIAGNOSIS — F250 Schizoaffective disorder, bipolar type: Secondary | ICD-10-CM | POA: Diagnosis not present

## 2016-09-30 DIAGNOSIS — Z79899 Other long term (current) drug therapy: Secondary | ICD-10-CM

## 2016-09-30 DIAGNOSIS — F17210 Nicotine dependence, cigarettes, uncomplicated: Secondary | ICD-10-CM | POA: Diagnosis not present

## 2016-09-30 DIAGNOSIS — F251 Schizoaffective disorder, depressive type: Secondary | ICD-10-CM

## 2016-09-30 DIAGNOSIS — F429 Obsessive-compulsive disorder, unspecified: Secondary | ICD-10-CM | POA: Diagnosis not present

## 2016-09-30 DIAGNOSIS — Z79891 Long term (current) use of opiate analgesic: Secondary | ICD-10-CM

## 2016-09-30 DIAGNOSIS — Z7982 Long term (current) use of aspirin: Secondary | ICD-10-CM | POA: Diagnosis not present

## 2016-09-30 MED ORDER — DULOXETINE HCL 30 MG PO CPEP: ORAL_CAPSULE | ORAL | 0 refills | Status: DC

## 2016-09-30 MED ORDER — QUETIAPINE FUMARATE 300 MG PO TABS: 600.0000 mg | ORAL_TABLET | Freq: Every day | ORAL | 0 refills | Status: DC

## 2016-09-30 MED ORDER — BUPROPION HCL ER (XL) 300 MG PO TB24: 300.0000 mg | ORAL_TABLET | Freq: Every morning | ORAL | 0 refills | Status: DC

## 2016-09-30 MED ORDER — ARIPIPRAZOLE 30 MG PO TABS: 30.0000 mg | ORAL_TABLET | Freq: Every day | ORAL | 0 refills | Status: DC

## 2016-09-30 NOTE — Progress Notes (Signed)
Patient ID: Jeanne [name], female   DOB: [birth year], 56 y.o.   MRN: [id number]  [facility] MD/PA/NP OP Progress Note  09/30/2016 2:30 PM Jeanne [name]  MRN:  [id number]  CC: follow up.  HPI: Pt continues to care for her mother. States mom is ludic today which is why she was able to leave her for a little while today.  Pt states she is stressed caring for her mother. Pt is trying to attend some caregiver support groups. Pt sometimes feels caregiver fatigue and is looking for ways to relax and recoup. She does have rare help from her sisters.  Pt goes to [employer] every Sunday and that is good for her.  Depression is more situational. Mood is better than at the last appointment. She is trying to stay positive. She has 2-3 bad days and uses good coping skills. She is no longer staying in bed all day. She is not isolating. Pt denies SI/HI.   Sleep is 5-6 hrs/night.  Pt wants to exercising but hasn't gone in 4 months.   Denies manic and hypomanic symptoms including periods of decreased need for sleep, increased energy, mood lability, impulsivity, FOI, and excessive spending.  Pt denies paranoia and AVH.  Taking meds as prescribed and denies SE.    Visit Diagnosis:     ICD-10-CM   1. Schizoaffective disorder, depressive type ([place]) F25.1 QUEtiapine (SEROQUEL) 300 MG tablet    buPROPion (WELLBUTRIN XL) 300 MG 24 hr tablet    ARIPiprazole (ABILIFY) 30 MG tablet    DULoxetine (CYMBALTA) 30 MG capsule  2. Obsessive-compulsive disorder, unspecified type F42.9 DULoxetine (CYMBALTA) 30 MG capsule       Past Psych Hx: Dx: Schizoaffective disorder- depressed type dx in 2001; OCD hx in 1994 Meds: Risperdal, Zyprexa, Prolixin, Prozac, Zoloft, Effexor, Lamictal Previous psychiatrist/therapist: Dr. Wylene Simmer last seen in Aug 2016 due to insurance reasons had to stop Hospitalizations: 5-6 times, last time was in 2004 in [place].[name] 2 yr outpatient day program in [place]. , 1 yr of outpt at [place] and 3  hospitalizations at [place] and [place].  SIB: denies Suicide attempts: denies, but did have many years of SI Hx of violent behavior towards others: denies Current access to guns: debues Hx of abuse: denies Military Hx: denies  Past Medical History:  Past Medical History:  Diagnosis Date  . Antiphospholipid antibody syndrome ([place])   . Arthritis   . Carpal tunnel syndrome 01/02/2014   Bilateral  . Clotting disorder ([place])   . 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 Depression   . Difficult intubation 07/09/2013   Glidescope used, see Anesthesia note  . DVT (deep venous thrombosis) ([place]) 2014   LEFT LEG  . Gastritis   . Glaucoma   . Heart murmur   . Hypertension   . Hypokalemia 03/05/2013  . Neuromuscular disorder ([place])    " NEUROPATHY IN  MY HANDS"  . Obsessive-compulsive disorder   . Schizo-affective psychosis ([place])   . Scleritis of both eyes   . Sleep apnea    Bipap  . Suppurative hidradenitis     Past Surgical History:  Procedure Laterality Date  . BUNIONECTOMY    . CERVICAL CONIZATION W/BX    . LAPAROSCOPIC ADRENALECTOMY Left 07/09/2013   Procedure: LAPAROSCOPIC LEFT  ADRENALECTOMY;  Surgeon: Earnstine Regal, MD;  Location: [facility];  Service: General;  Laterality: Left;  . LYMPH NODE DISSECTION     Family Psychiatric and Medical History:  Family History  Problem Relation Age of Onset  .  Cancer Maternal Aunt        breast ca  . Heart attack Maternal Grandmother 54  . Heart attack Maternal Grandfather 62  . Hypertension Sister   . Hypertension Sister   . Seizures Maternal Uncle   . Alcohol abuse Neg Hx   . Anxiety disorder Neg Hx   . Bipolar disorder Neg Hx   . Depression Neg Hx   . Drug abuse Neg Hx   . Schizophrenia Neg Hx    Social History:  Social History   Social History  . Marital status: Single    Spouse name: N/A  . Number of children: 0  . Years of education: college   Occupational History  . Retired    Social History Main Topics  . Smoking status:  Current Every Day Smoker    Packs/day: 0.50    Years: 25.00    Types: Cigarettes  . Smokeless tobacco: Never Used  . Alcohol use No     Comment: " drink just on weekends " Last drink was in the spring of 2016  . Drug use: No  . Sexual activity: Not Currently   Other Topics Concern  . None   Social History Narrative   Pt is living in [place] with mother. Born and raised in [place] by parents. Childhood was fine. Pt has 3 older sisters. Pt has a law degree. 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 Single, no kids.      Musculoskeletal: Strength & Muscle Tone: decreased Gait & Station: normal, walking with cane Patient leans: N/A  Psychiatric Specialty Exam:   HPI  Review of Systems  Musculoskeletal: Positive for joint pain. Negative for back pain, falls and neck pain.  Neurological: Negative for dizziness, sensory change, speech change and headaches.  Psychiatric/Behavioral: Negative for depression, hallucinations, substance abuse and suicidal ideas. The patient is not nervous/anxious and does not have insomnia.     Blood pressure 120/66, pulse 74, height 5\' 10"  (1.778 m), weight 281 lb (127.5 kg).Body mass index is 40.32 kg/m.  General Appearance: Fairly Groomed  Eye Contact:  Good  Speech:  Clear and Coherent and Normal Rate  Volume:  Normal  Mood:  Euthymic  Affect:  Restricted  Thought Process:  Goal Directed and Descriptions of Associations: Intact  Orientation:  Full (Time, Place, and Person)  Thought Content:  Logical  Suicidal Thoughts:  No  Homicidal Thoughts:  No  Memory:  Immediate;   Fair Recent;   Fair Remote;   Fair  Judgement:  Intact  Insight:  Present  Psychomotor Activity:  Normal  Concentration:  Good  Recall:  Good  Fund of Knowledge: Fair  Language: Fair  Akathisia:  No  Handed:  Right  AIMS (if indicated):  AIMS:  Facial and Oral Movements  Muscles of Facial Expression: None,  normal  Lips and Perioral Area: None, normal  Jaw: None, normal  Tongue: None, normal Extremity Movements: Upper (arms, wrists, hands, fingers): None, normal  Lower (legs, knees, ankles, toes): None, normal,  Trunk Movements:  Neck, shoulders, hips: None, normal,  Overall Severity : Severity of abnormal movements (highest score from questions above): None, normal  Incapacitation due to abnormal movements: None, normal  Patient's awareness of abnormal movements (rate only patient's report): No Awareness, Dental Status  Current problems with teeth and/or dentures?: No  Does patient usually wear dentures?: No     Assets:  Communication Skills Desire for  Improvement Financial Resources/Insurance Housing Social Support Talents/Skills Transportation Vocational/Educational  ADL's:  Intact  Cognition: WNL  Sleep: good   Is the patient at risk to self?  No. Has the patient been a risk to self in the past 6 months?  No. Has the patient been a risk to self within the distant past?  No. Is the patient a risk to others?  No. Has the patient been a risk to others in the past 6 months?  No. Has the patient been a risk to others within the distant past?  No.  Current Medications: Current Outpatient Prescriptions  Medication Sig Dispense Refill  . amLODipine (NORVASC) 5 MG tablet Take 5 mg by mouth every morning.     . ARIPiprazole (ABILIFY) 30 MG tablet Take 1 tablet (30 mg total) by mouth daily. 90 tablet 0  . aspirin EC 81 MG tablet Take 162 mg by mouth daily.    [name] atorvastatin (LIPITOR) 20 MG tablet Take 1 tablet by mouth daily.    [name] buPROPion (WELLBUTRIN XL) 300 MG 24 hr tablet Take 1 tablet (300 mg total) by mouth every morning. 90 tablet 0  . Calcium Carb-Cholecalciferol 600-800 MG-UNIT TABS Take 1 tablet by mouth daily.    . DULoxetine (CYMBALTA) 30 MG capsule TAKE 3 CAPSULES(90 MG) BY MOUTH EVERY MORNING 270 capsule 0  . esomeprazole (NEXIUM) 40 MG capsule Take 40 mg by mouth  daily before breakfast.    . Krill Oil 300 MG CAPS Take 300 mg by mouth daily.    [name] levobunolol (BETAGAN) 0.5 % ophthalmic solution Place 1 drop into both eyes every morning.     . lubiprostone (AMITIZA) 24 MCG capsule Take 24 mcg by mouth 2 (two) times daily with a meal.    . Multiple Vitamin (MULTIVITAMIN WITH MINERALS) TABS tablet Take 1 tablet by mouth daily.    [name] NIFEdipine (PROCARDIA-XL/ADALAT CC) 60 MG 24 hr tablet Take 1 tablet (60 mg total) by mouth daily. 30 tablet 1  . oxyCODONE-acetaminophen (PERCOCET/ROXICET) 5-325 MG tablet Take 1-2 tablets by mouth every 4 (four) hours as needed for severe pain.    [name] PRESCRIPTION MEDICATION Inhale 1 application into the lungs at bedtime. BiPAP setting 8/12    . QUEtiapine (SEROQUEL) 300 MG tablet Take 2 tablets (600 mg total) by mouth at bedtime. 180 tablet 0  . verapamil (CALAN-SR) 180 MG CR tablet Take 1 tablet (180 mg total) by mouth daily. 30 tablet 1  . glucosamine-chondroitin 500-400 MG tablet Take 2 tablets by mouth every morning.    . leflunomide (ARAVA) 20 MG tablet Take 1 tablet by mouth daily. Reported on 07/10/2015     No current facility-administered medications for this visit.     reviewed A&P on 09/30/16 and same as previous visits except as noted  Treatment Plan Summary:Medication management  Assessment: Schizoaffective disorder- Bipolar disorder type- currently stable; OCD by hx  -Risks and benefits, side effects and alternatives discussed with patient, pt was given an opportunity to ask questions about medication, illness, and treatment. All current psychiatric medications have been reviewed and discussed with the patient and adjusted as clinically appropriate. The patient has been provided an accurate and updated list of the medications being now prescribed.  Meds: Seroquel 600mg  po qD for schizoaffective disorder Abilify 30mg  po qD for schizoaffective disorder Cymbalta 90mg  po qD for mood and OCD Wellbutrin XL 300mg  po qD  for mood Pt reports she was unstable with monoantipsychotic therapy. She has been on this regime  for many years and quality of life is good. Symptoms are stable. Benefit outweights the risk. Pt does not want meds changed today   Labs: Pt will fax in recent labs  Therapy: brief supportive therapy provided. Discussed psychosocial stressors in detail.   Consultations: Encouraged to continue individual therapy  F/up in 3 months or sooner if needed. Charlcie Cradle 09/30/2016, 2:30 PM

## 2016-10-11 ENCOUNTER — Ambulatory Visit: Payer: 59 | Admitting: Psychology

## 2016-11-02 ENCOUNTER — Ambulatory Visit (INDEPENDENT_AMBULATORY_CARE_PROVIDER_SITE_OTHER): Payer: 59 | Admitting: Psychology

## 2016-11-02 DIAGNOSIS — F250 Schizoaffective disorder, bipolar type: Secondary | ICD-10-CM

## 2016-11-22 ENCOUNTER — Ambulatory Visit (INDEPENDENT_AMBULATORY_CARE_PROVIDER_SITE_OTHER): Payer: 59 | Admitting: Psychology

## 2016-11-22 DIAGNOSIS — F250 Schizoaffective disorder, bipolar type: Secondary | ICD-10-CM

## 2016-12-16 ENCOUNTER — Ambulatory Visit: Payer: 59 | Admitting: Psychology

## 2016-12-30 ENCOUNTER — Encounter (HOSPITAL_COMMUNITY): Payer: Self-pay | Admitting: Psychiatry

## 2016-12-30 ENCOUNTER — Ambulatory Visit (INDEPENDENT_AMBULATORY_CARE_PROVIDER_SITE_OTHER): Payer: 59 | Admitting: Psychiatry

## 2016-12-30 DIAGNOSIS — F17210 Nicotine dependence, cigarettes, uncomplicated: Secondary | ICD-10-CM

## 2016-12-30 DIAGNOSIS — G4700 Insomnia, unspecified: Secondary | ICD-10-CM | POA: Diagnosis not present

## 2016-12-30 DIAGNOSIS — F429 Obsessive-compulsive disorder, unspecified: Secondary | ICD-10-CM

## 2016-12-30 DIAGNOSIS — F39 Unspecified mood [affective] disorder: Secondary | ICD-10-CM | POA: Diagnosis not present

## 2016-12-30 DIAGNOSIS — Z634 Disappearance and death of family member: Secondary | ICD-10-CM | POA: Diagnosis not present

## 2016-12-30 DIAGNOSIS — F251 Schizoaffective disorder, depressive type: Secondary | ICD-10-CM

## 2016-12-30 MED ORDER — QUETIAPINE FUMARATE 300 MG PO TABS: 600.0000 mg | ORAL_TABLET | Freq: Every day | ORAL | 0 refills | Status: DC

## 2016-12-30 MED ORDER — ARIPIPRAZOLE 30 MG PO TABS: 30.0000 mg | ORAL_TABLET | Freq: Every day | ORAL | 0 refills | Status: DC

## 2016-12-30 MED ORDER — DULOXETINE HCL 30 MG PO CPEP: ORAL_CAPSULE | ORAL | 0 refills | Status: DC

## 2016-12-30 MED ORDER — BUPROPION HCL ER (XL) 300 MG PO TB24: 300.0000 mg | ORAL_TABLET | Freq: Every morning | ORAL | 0 refills | Status: DC

## 2016-12-30 NOTE — Progress Notes (Signed)
[facility] MD/PA/NP OP Progress Note  12/30/2016 1:20 PM Jeanne [name]  MRN:  [id number]  Chief Complaint:  Chief Complaint    Follow-up; Medication Refill     HPI: pt states she is not doing well. Her mother died in 12-04-2022. She is not doing well in self care. Pt forgets to take her meds about once a week. Pt was supposed to start a new injection for her RA but never got around to it. Pt is not going to the gym and doesn't have the motivation to do it. She tries to stay busy with volunteering at the [employer]. Pt is watching alt of tv. She is sleeping well.  She is depressed but she is surprised she is didn't fall apart when her mom died. The depression is more situational but not overwhelming. It comes on 1-2x/week and on those days she is sad, isolate and is easily irritated. It lasts for a few hours. Pt denies SI/HI.  Pt denies AVH and paranoia.  Denies manic and hypomanic symptoms including periods of decreased need for sleep, increased energy, mood lability, impulsivity, FOI, and excessive spending.  Pt talks to one of her sisters daily.  Taking meds as prescribed and denies SE.   Visit Diagnosis:    ICD-10-CM   1. Schizoaffective disorder, depressive type ([place]) F25.1 ARIPiprazole (ABILIFY) 30 MG tablet    buPROPion (WELLBUTRIN XL) 300 MG 24 hr tablet    DULoxetine (CYMBALTA) 30 MG capsule    QUEtiapine (SEROQUEL) 300 MG tablet  2. Obsessive-compulsive disorder, unspecified type F42.9 DULoxetine (CYMBALTA) 30 MG capsule       Past Psychiatric History:  Dx: Schizoaffective disorder- depressed type dx in 2001; OCD hx in 1994 Meds: Risperdal, Zyprexa, Prolixin, Prozac, Zoloft, Effexor, Lamictal Previous psychiatrist/therapist: Dr. Wylene Simmer last seen in Aug 2016 due to insurance reasons had to stop Hospitalizations: 5-6 times, last time was in 2004 in [place].[name] 2 yr outpatient day program in [place]. , 1 yr of outpt at [facility] and 3 hospitalizations at [facility] and [facility].  SIB: denies Suicide  attempts: denies, but did have many years of SI Hx of violent behavior towards others: denies Current access to guns: debues Hx of abuse: denies Military Hx: denies  Past Medical History:  Past Medical History:  Diagnosis Date  . Antiphospholipid antibody syndrome ([place])   . Arthritis   . Carpal tunnel syndrome 01/02/2014   Bilateral  . Clotting disorder ([place])   . 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 Depression   . Difficult intubation 07/09/2013   Glidescope used, see Anesthesia note  . DVT (deep venous thrombosis) ([place]) 2014   LEFT LEG  . Gastritis   . Glaucoma   . Heart murmur   . Hypertension   . Hypokalemia 03/05/2013  . Neuromuscular disorder ([place])    " NEUROPATHY IN  MY HANDS"  . Obsessive-compulsive disorder   . Schizo-affective psychosis ([place])   . Scleritis of both eyes   . Sleep apnea    Bipap  . Suppurative hidradenitis     Past Surgical History:  Procedure Laterality Date  . BUNIONECTOMY    . CERVICAL CONIZATION W/BX    . LAPAROSCOPIC ADRENALECTOMY Left 07/09/2013   Procedure: LAPAROSCOPIC LEFT  ADRENALECTOMY;  Surgeon: Earnstine Regal, MD;  Location: [facility];  Service: General;  Laterality: Left;  . LYMPH NODE DISSECTION      Family Psychiatric History:  Family History  Problem Relation Age of Onset  . Cancer Maternal Aunt  breast ca  . Heart attack Maternal Grandmother 54  . Heart attack Maternal Grandfather 62  . Hypertension Sister   . Hypertension Sister   . Seizures Maternal Uncle   . Alcohol abuse Neg Hx   . Anxiety disorder Neg Hx   . Bipolar disorder Neg Hx   . Depression Neg Hx   . Drug abuse Neg Hx   . Schizophrenia Neg Hx     Social History:  Social History   Social History  . Marital status: Single    Spouse name: N/A  . Number of children: 0  . Years of education: college   Occupational History  . Retired    Social History Main Topics  . Smoking status: Current Every Day Smoker    Packs/day: 0.50    Years: 25.00    Types:  Cigarettes  . Smokeless tobacco: Never Used  . Alcohol use No     Comment: " drink just on weekends " Last drink was in the spring of 2016  . Drug use: No  . Sexual activity: Not Currently   Other Topics Concern  . None   Social History Narrative   Pt is living in [place] with mother. Born and raised in [place] by parents. Childhood was fine. Pt has 3 older sisters. Pt has a law degree. 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 Single, no kids.     Allergies:  Allergies  Allergen Reactions  . Ace Inhibitors Anaphylaxis  . Arava [Leflunomide] Other (See Comments)    Nephritis and kidney clearance issues  . Travatan [Travoprost] Other (See Comments)    Red eyes, irritation     Metabolic Disorder Labs: No results found for: HGBA1C, MPG No results found for: PROLACTIN No results found for: CHOL, TRIG, HDL, CHOLHDL, VLDL, LDLCALC Lab Results  Component Value Date   TSH 1.639 03/05/2013    Therapeutic Level Labs: No results found for: LITHIUM No results found for: VALPROATE No components found for:  CBMZ  Current Medications: Current Outpatient Prescriptions  Medication Sig Dispense Refill  . amLODipine (NORVASC) 5 MG tablet Take 5 mg by mouth every morning.     . ARIPiprazole (ABILIFY) 30 MG tablet Take 1 tablet (30 mg total) by mouth daily. 90 tablet 0  . aspirin EC 81 MG tablet Take 162 mg by mouth daily.    [name] atorvastatin (LIPITOR) 20 MG tablet Take 1 tablet by mouth daily.    [name] buPROPion (WELLBUTRIN XL) 300 MG 24 hr tablet Take 1 tablet (300 mg total) by mouth every morning. 90 tablet 0  . Calcium Carb-Cholecalciferol 600-800 MG-UNIT TABS Take 1 tablet by mouth daily.    . DULoxetine (CYMBALTA) 30 MG capsule TAKE 3 CAPSULES(90 MG) BY MOUTH EVERY MORNING 270 capsule 0  . esomeprazole (NEXIUM) 40 MG capsule Take 40 mg by mouth daily before breakfast.    . glucosamine-chondroitin 500-400 MG tablet Take 2 tablets  by mouth every morning.    Javier Docker Oil 300 MG CAPS Take 300 mg by mouth daily.    [name] leflunomide (ARAVA) 20 MG tablet Take 1 tablet by mouth daily. Reported on 07/10/2015    . levobunolol (BETAGAN) 0.5 % ophthalmic solution Place 1 drop into both eyes every morning.     . lubiprostone (AMITIZA) 24 MCG capsule Take 24 mcg by mouth 2 (two) times daily with a meal.    . Multiple Vitamin (MULTIVITAMIN WITH MINERALS)  TABS tablet Take 1 tablet by mouth daily.    [name] NIFEdipine (PROCARDIA-XL/ADALAT CC) 60 MG 24 hr tablet Take 1 tablet (60 mg total) by mouth daily. 30 tablet 1  . oxyCODONE-acetaminophen (PERCOCET/ROXICET) 5-325 MG tablet Take 1-2 tablets by mouth every 4 (four) hours as needed for severe pain.    [name] PRESCRIPTION MEDICATION Inhale 1 application into the lungs at bedtime. BiPAP setting 8/12    . QUEtiapine (SEROQUEL) 300 MG tablet Take 2 tablets (600 mg total) by mouth at bedtime. 180 tablet 0  . verapamil (CALAN-SR) 180 MG CR tablet Take 1 tablet (180 mg total) by mouth daily. 30 tablet 1   No current facility-administered medications for this visit.      Musculoskeletal: Strength & Muscle Tone: within normal limits Gait & Station: normal, walks with cane Patient leans: N/A  Psychiatric Specialty Exam: Review of Systems  Neurological: Negative for dizziness, tremors, sensory change and headaches.  Psychiatric/Behavioral: Positive for depression. Negative for hallucinations, substance abuse and suicidal ideas. The patient has insomnia. The patient is not nervous/anxious.     Blood pressure (!) 142/82, pulse 77, height 5\' 9"  (1.753 m), weight 281 lb 9.6 oz (127.7 kg).Body mass index is 41.59 kg/m.  General Appearance: Fairly Groomed  Eye Contact:  Fair  Speech:  Clear and Coherent and Normal Rate  Volume:  Normal  Mood:  Depressed  Affect:  Full Range  Thought Process:  Goal Directed and Descriptions of Associations: Intact  Orientation:  Full (Time, Place, and Person)  Thought  Content: Logical   Suicidal Thoughts:  No  Homicidal Thoughts:  No  Memory:  Immediate;   Good Recent;   Good Remote;   Good  Judgement:  Good  Insight:  Good  Psychomotor Activity:  Normal  Concentration:  Concentration: Good and Attention Span: Good  Recall:  Good  Fund of Knowledge: Good  Language: Good  Akathisia:  No  Handed:  Right  AIMS (if indicated): not done  Assets:  Communication Skills Desire for Improvement Housing Leisure Time Herbalist Vocational/Educational  ADL's:  Intact  Cognition: WNL  Sleep:  Good   Screenings:   Assessment and Plan: Schizoaffective d/o-Bipolar type-currently stable; OCD by hx   Medication management with supportive therapy. Risks/benefits and SE of the medication discussed. Pt verbalized understanding and verbal consent obtained for treatment.  Affirm with the patient that the medications are taken as ordered. Patient expressed understanding of how their medications were to be used.   The risk of un-intended pregnancy is low based on the fact that pt reports she is post- menopausal. Pt is aware that these meds carry a teratogenic risk. Pt will discuss plan of action if she does or plans to become pregnant in the future.   Meds:  Seroquel 600mg  po qD for schizoaffective disorder Abilify 30mg  po qD for schizoaffective disorder Cymbalta 90mg  po qD for mood and OCD Wellbutrin XL 300mg  po qD for mood Pt reports she was unstable with monoantipsychotic therapy. She has been on this regime for many years and quality of life is good. Symptoms are stable. Benefit outweights the risk. Pt does not want meds changed today    Labs: pt will sign MR to get recent labs  Therapy: brief supportive therapy provided. Discussed psychosocial stressors in detail.  Encouraged patient to work on increasing social supports   Consultations: none  Pt denies SI and is at an acute low risk for suicide. Patient told to call clinic if any  problems occur. Patient advised to go to ER if they should develop SI/HI, side effects, or if symptoms worsen. Has crisis numbers to call if needed. Pt verbalized understanding.  F/up in 2 months or sooner if needed    Charlcie Cradle, MD 12/30/2016, 1:20 PM

## 2017-01-13 ENCOUNTER — Telehealth: Payer: Self-pay

## 2017-01-13 NOTE — Telephone Encounter (Signed)
ROI Faxed to [facility] [phone]

## 2017-02-11 ENCOUNTER — Ambulatory Visit (INDEPENDENT_AMBULATORY_CARE_PROVIDER_SITE_OTHER): Payer: 59 | Admitting: Psychology

## 2017-02-11 DIAGNOSIS — F321 Major depressive disorder, single episode, moderate: Secondary | ICD-10-CM

## 2017-02-14 NOTE — Telephone Encounter (Signed)
Rec'd from [facility] forwarded 8 pages to GI Historical Provider

## 2017-02-15 ENCOUNTER — Telehealth: Payer: Self-pay | Admitting: Gastroenterology

## 2017-02-22 NOTE — Telephone Encounter (Signed)
I reviewed the records.  (For documentation purposes - in Feb 2007, tubular adenoma polyp < 52mm removed.  In Aug 2013, excellent prep and only hyperplastic polyps removed).  The polyps she had in 2013 were not the precancerous type.  Given all that, and as she has no reported family history of colorectal cancer in parents or siblings, current screening guidelines state that she would therefore have her next routine colonoscopy at a 10 year interval. She is due in August 2023.  If there are questions or concerns about that, please make the patient an office appointment with me.  If she turns out to have a family history of colorectal cancer in parents or siblings, or the patient is having symptoms, she should have an appointment with me.  Otherwise, primary care should refer the patient back to [name] in 2023.  Please send that reply to primary care since they are outside the Epic system and need to add this to her medical history and plan for referral at the appropriate time.

## 2017-02-22 NOTE — Telephone Encounter (Signed)
Left message for patient notifying her of this.

## 2017-03-03 ENCOUNTER — Ambulatory Visit (HOSPITAL_COMMUNITY): Payer: Self-pay | Admitting: Psychiatry

## 2017-03-24 ENCOUNTER — Ambulatory Visit: Payer: Medicare Other | Admitting: Psychology

## 2017-03-24 ENCOUNTER — Ambulatory Visit (INDEPENDENT_AMBULATORY_CARE_PROVIDER_SITE_OTHER): Payer: Medicare Other | Admitting: Psychology

## 2017-03-24 DIAGNOSIS — F250 Schizoaffective disorder, bipolar type: Secondary | ICD-10-CM | POA: Diagnosis not present

## 2017-03-31 ENCOUNTER — Ambulatory Visit (INDEPENDENT_AMBULATORY_CARE_PROVIDER_SITE_OTHER): Payer: Medicare Other | Admitting: Psychiatry

## 2017-03-31 ENCOUNTER — Encounter (HOSPITAL_COMMUNITY): Payer: Self-pay | Admitting: Psychiatry

## 2017-03-31 DIAGNOSIS — F429 Obsessive-compulsive disorder, unspecified: Secondary | ICD-10-CM | POA: Diagnosis not present

## 2017-03-31 DIAGNOSIS — F17210 Nicotine dependence, cigarettes, uncomplicated: Secondary | ICD-10-CM | POA: Diagnosis not present

## 2017-03-31 DIAGNOSIS — F251 Schizoaffective disorder, depressive type: Secondary | ICD-10-CM | POA: Diagnosis not present

## 2017-03-31 MED ORDER — BUPROPION HCL ER (XL) 300 MG PO TB24: 300.0000 mg | ORAL_TABLET | Freq: Every morning | ORAL | 0 refills | Status: DC

## 2017-03-31 MED ORDER — QUETIAPINE FUMARATE 300 MG PO TABS: 600.0000 mg | ORAL_TABLET | Freq: Every day | ORAL | 0 refills | Status: DC

## 2017-03-31 MED ORDER — DULOXETINE HCL 30 MG PO CPEP: ORAL_CAPSULE | ORAL | 0 refills | Status: DC

## 2017-03-31 MED ORDER — ARIPIPRAZOLE 30 MG PO TABS: 30.0000 mg | ORAL_TABLET | Freq: Every day | ORAL | 0 refills | Status: DC
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
## 2017-05-02 ENCOUNTER — Ambulatory Visit: Payer: Medicare Other | Admitting: Psychology

## 2017-05-27 ENCOUNTER — Ambulatory Visit (INDEPENDENT_AMBULATORY_CARE_PROVIDER_SITE_OTHER): Payer: Medicare Other | Admitting: Psychology

## 2017-05-27 DIAGNOSIS — F250 Schizoaffective disorder, bipolar type: Secondary | ICD-10-CM | POA: Diagnosis not present

## 2017-06-30 ENCOUNTER — Ambulatory Visit (HOSPITAL_COMMUNITY): Payer: Self-pay | Admitting: Psychiatry

## 2017-07-04 ENCOUNTER — Ambulatory Visit (INDEPENDENT_AMBULATORY_CARE_PROVIDER_SITE_OTHER): Payer: Medicare Other | Admitting: Psychology

## 2017-07-04 DIAGNOSIS — F250 Schizoaffective disorder, bipolar type: Secondary | ICD-10-CM | POA: Diagnosis not present

## 2017-07-27 ENCOUNTER — Ambulatory Visit: Payer: Medicare Other | Admitting: Podiatry

## 2017-08-04 ENCOUNTER — Ambulatory Visit (HOSPITAL_COMMUNITY): Payer: Medicare Other | Admitting: Psychiatry

## 2017-08-18 ENCOUNTER — Ambulatory Visit (INDEPENDENT_AMBULATORY_CARE_PROVIDER_SITE_OTHER): Payer: Medicare Other | Admitting: Psychology

## 2017-08-18 DIAGNOSIS — F250 Schizoaffective disorder, bipolar type: Secondary | ICD-10-CM | POA: Diagnosis not present

## 2017-08-19 ENCOUNTER — Other Ambulatory Visit (HOSPITAL_COMMUNITY): Payer: Self-pay | Admitting: Psychiatry

## 2017-08-19 DIAGNOSIS — F251 Schizoaffective disorder, depressive type: Secondary | ICD-10-CM

## 2017-08-19 DIAGNOSIS — F429 Obsessive-compulsive disorder, unspecified: Secondary | ICD-10-CM

## 2017-08-23 ENCOUNTER — Other Ambulatory Visit (HOSPITAL_COMMUNITY): Payer: Self-pay

## 2017-08-23 ENCOUNTER — Other Ambulatory Visit (HOSPITAL_COMMUNITY): Payer: Self-pay | Admitting: Psychiatry

## 2017-08-23 DIAGNOSIS — F251 Schizoaffective disorder, depressive type: Secondary | ICD-10-CM

## 2017-08-23 MED ORDER — BUPROPION HCL ER (XL) 300 MG PO TB24: 300.0000 mg | ORAL_TABLET | Freq: Every morning | ORAL | 0 refills | Status: DC

## 2017-08-24 ENCOUNTER — Other Ambulatory Visit (HOSPITAL_COMMUNITY): Payer: Self-pay | Admitting: Psychiatry

## 2017-08-24 ENCOUNTER — Other Ambulatory Visit (HOSPITAL_COMMUNITY): Payer: Self-pay

## 2017-08-24 DIAGNOSIS — F429 Obsessive-compulsive disorder, unspecified: Secondary | ICD-10-CM

## 2017-08-24 DIAGNOSIS — F251 Schizoaffective disorder, depressive type: Secondary | ICD-10-CM

## 2017-08-24 MED ORDER — DULOXETINE HCL 30 MG PO CPEP: ORAL_CAPSULE | ORAL | 0 refills | Status: DC

## 2017-09-22 ENCOUNTER — Ambulatory Visit (INDEPENDENT_AMBULATORY_CARE_PROVIDER_SITE_OTHER): Payer: Medicare Other | Admitting: Psychiatry

## 2017-09-22 ENCOUNTER — Encounter (HOSPITAL_COMMUNITY): Payer: Self-pay | Admitting: Psychiatry

## 2017-09-22 VITALS — BP 138/74 | HR 102 | Ht 69.0 in | Wt 269.0 lb

## 2017-09-22 DIAGNOSIS — F251 Schizoaffective disorder, depressive type: Secondary | ICD-10-CM | POA: Diagnosis not present

## 2017-09-22 DIAGNOSIS — F429 Obsessive-compulsive disorder, unspecified: Secondary | ICD-10-CM

## 2017-09-22 DIAGNOSIS — F17210 Nicotine dependence, cigarettes, uncomplicated: Secondary | ICD-10-CM | POA: Diagnosis not present

## 2017-09-22 DIAGNOSIS — F39 Unspecified mood [affective] disorder: Secondary | ICD-10-CM

## 2017-09-22 DIAGNOSIS — Z79899 Other long term (current) drug therapy: Secondary | ICD-10-CM

## 2017-09-22 MED ORDER — DULOXETINE HCL 30 MG PO CPEP: ORAL_CAPSULE | ORAL | 0 refills | Status: DC

## 2017-09-22 MED ORDER — BUPROPION HCL ER (XL) 300 MG PO TB24: ORAL_TABLET | ORAL | 0 refills | Status: DC

## 2017-09-22 MED ORDER — QUETIAPINE FUMARATE 300 MG PO TABS: 600.0000 mg | ORAL_TABLET | Freq: Every day | ORAL | 0 refills | Status: DC

## 2017-09-22 MED ORDER — ARIPIPRAZOLE 30 MG PO TABS: 30.0000 mg | ORAL_TABLET | Freq: Every day | ORAL | 0 refills | Status: DC

## 2017-09-22 NOTE — Progress Notes (Signed)
[facility] MD/PA/NP OP Progress Note  09/22/2017 3:40 PM Jeanne [name]  MRN:  [id number]  Chief Complaint:  Chief Complaint    Follow-up     HPI: Patient reports she is doing really well and cannot complain at this time.  She missed her last appointment due to to some deaths in the family.  She has been attending her counseling sessions and reports that she is still active in going to church and classes.  Patient decided to go to her 35th reunion from [place].  She states that she talked herself into the experience and it came out to be very positive for her.  She shared her mental health history with several people and felt that she got a good response.  She denies depression symptoms.  She denies anxiety symptoms.  She denies symptoms of mania and hypomania.  She denies SI/HI.  She denies auditory and visual hallucinations, paranoia, magical thinking, ideas of reference.  She is taking her medications as prescribed and denies side effects.  Visit Diagnosis:    ICD-10-CM   1. Schizoaffective disorder, depressive type ([place]) F25.1 EKG    ARIPiprazole (ABILIFY) 30 MG tablet    buPROPion (WELLBUTRIN XL) 300 MG 24 hr tablet    DULoxetine (CYMBALTA) 30 MG capsule    QUEtiapine (SEROQUEL) 300 MG tablet  2. Obsessive-compulsive disorder, unspecified type F42.9 DULoxetine (CYMBALTA) 30 MG capsule  3. Encounter for long-term (current) use of medications Z79.899 EKG    Past Psychiatric History:  Dx: Schizoaffective disorder- depressed type dx in 2001; OCD hx in 1994 Meds: Risperdal, Zyprexa, Prolixin, Prozac, Zoloft, Effexor, Lamictal Previous psychiatrist/therapist: Dr. Wylene Simmer last seen in Aug 2016 due to insurance reasons had to stop Hospitalizations: 5-6 times, last time was in 2004 in [place].[name] 2 yr outpatient day program in [place]. , 1 yr of outpt at [facility] and 3 hospitalizations at [facility] and [facility].   SIB: denies Suicide attempts: denies, but did have many years of SI Hx of violent behavior towards  others: denies Current access to guns: debues Hx of abuse: denies Military Hx: denies   Past Medical History:  Past Medical History:  Diagnosis Date  . Antiphospholipid antibody syndrome ([place])   . Arthritis   . Carpal tunnel syndrome 01/02/2014   Bilateral  . Clotting disorder ([place])   . 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 Depression   . Difficult intubation 07/09/2013   Glidescope used, see Anesthesia note  . DVT (deep venous thrombosis) ([place]) 2014   LEFT LEG  . Gastritis   . Glaucoma   . Heart murmur   . Hypertension   . Hypokalemia 03/05/2013  . Neuromuscular disorder ([place])    " NEUROPATHY IN  MY HANDS"  . Obsessive-compulsive disorder   . Schizo-affective psychosis ([place])   . Scleritis of both eyes   . Sleep apnea    Bipap  . Suppurative hidradenitis     Past Surgical History:  Procedure Laterality Date  . BUNIONECTOMY    . CERVICAL CONIZATION W/BX    . LAPAROSCOPIC ADRENALECTOMY Left 07/09/2013   Procedure: LAPAROSCOPIC LEFT  ADRENALECTOMY;  Surgeon: Earnstine Regal, MD;  Location: [facility];  Service: General;  Laterality: Left;  . LYMPH NODE DISSECTION      Family Psychiatric History: Family History  Problem Relation Age of Onset  . Cancer Maternal Aunt        breast ca  . Heart attack Maternal Grandmother 54  . Heart attack Maternal Grandfather 62  . Hypertension Sister   .  Hypertension Sister   . Seizures Maternal Uncle   . Alcohol abuse Neg Hx   . Anxiety disorder Neg Hx   . Bipolar disorder Neg Hx   . Depression Neg Hx   . Drug abuse Neg Hx   . Schizophrenia Neg Hx     Social History:  Social History   Socioeconomic History  . Marital status: Single    Spouse name: Not on file  . Number of children: 0  . Years of education: college  . Highest education level: Not on file  Occupational History  . Occupation: Retired  Scientific laboratory technician  . Financial resource strain: Not on file  . Food insecurity:    Worry: Not on file    Inability: Not on file  .  Transportation needs:    Medical: Not on file    Non-medical: Not on file  Tobacco Use  . Smoking status: Current Every Day Smoker    Packs/day: 0.50    Years: 25.00    Pack years: 12.50    Types: Cigarettes  . Smokeless tobacco: Never Used  Substance and Sexual Activity  . Alcohol use: No    Alcohol/week: 0.0 oz    Comment: " drink just on weekends " Last drink was in the spring of 2016  . Drug use: No  . Sexual activity: Not Currently  Lifestyle  . Physical activity:    Days per week: Not on file    Minutes per session: Not on file  . Stress: Not on file  Relationships  . Social connections:    Talks on phone: Not on file    Gets together: Not on file    Attends religious service: Not on file    Active member of club or organization: Not on file    Attends meetings of clubs or organizations: Not on file    Relationship status: Not on file  Other Topics Concern  . Not on file  Social History Narrative   Pt is living in [place] with mother. Born and raised in [place] by parents. Childhood was fine. Pt has 3 older sisters. Pt has a law degree. 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 Single, no kids.     Allergies:  Allergies  Allergen Reactions  . Ace Inhibitors Anaphylaxis  . Arava [Leflunomide] Other (See Comments)    Nephritis and kidney clearance issues  . Travatan [Travoprost] Other (See Comments)    Red eyes, irritation     Metabolic Disorder Labs: No results found for: HGBA1C, MPG No results found for: PROLACTIN No results found for: CHOL, TRIG, HDL, CHOLHDL, VLDL, LDLCALC Lab Results  Component Value Date   TSH 1.639 03/05/2013    Therapeutic Level Labs: No results found for: LITHIUM No results found for: VALPROATE No components found for:  CBMZ  Current Medications: Current Outpatient Medications  Medication Sig Dispense Refill  . amLODipine (NORVASC) 5 MG tablet Take 5 mg by mouth  every morning.     . ARIPiprazole (ABILIFY) 30 MG tablet Take 1 tablet (30 mg total) by mouth daily. 90 tablet 0  . aspirin EC 81 MG tablet Take 162 mg by mouth daily.    [name] atorvastatin (LIPITOR) 20 MG tablet Take 1 tablet by mouth daily.    [name] buPROPion (WELLBUTRIN XL) 300 MG 24 hr tablet TAKE 1 TABLET(300 MG) BY MOUTH EVERY MORNING 90 tablet 0  . Calcium Carb-Cholecalciferol 600-800 MG-UNIT TABS  Take 1 tablet by mouth daily.    . DULoxetine (CYMBALTA) 30 MG capsule TAKE 3 CAPSULES BY MOUTH EVERY MORNING 90 capsule 0  . esomeprazole (NEXIUM) 40 MG capsule Take 40 mg by mouth daily before breakfast.    . glucosamine-chondroitin 500-400 MG tablet Take 2 tablets by mouth every morning.    Javier Docker Oil 300 MG CAPS Take 300 mg by mouth daily.    [name] leflunomide (ARAVA) 20 MG tablet Take 1 tablet by mouth daily. Reported on 07/10/2015    . levobunolol (BETAGAN) 0.5 % ophthalmic solution Place 1 drop into both eyes every morning.     . lubiprostone (AMITIZA) 24 MCG capsule Take 24 mcg by mouth 2 (two) times daily with a meal.    . Multiple Vitamin (MULTIVITAMIN WITH MINERALS) TABS tablet Take 1 tablet by mouth daily.    [name] NIFEdipine (PROCARDIA-XL/ADALAT CC) 60 MG 24 hr tablet Take 1 tablet (60 mg total) by mouth daily. 30 tablet 1  . oxyCODONE-acetaminophen (PERCOCET/ROXICET) 5-325 MG tablet Take 1-2 tablets by mouth every 4 (four) hours as needed for severe pain.    [name] PRESCRIPTION MEDICATION Inhale 1 application into the lungs at bedtime. BiPAP setting 8/12    . QUEtiapine (SEROQUEL) 300 MG tablet Take 2 tablets (600 mg total) by mouth at bedtime. 180 tablet 0  . verapamil (CALAN-SR) 180 MG CR tablet Take 1 tablet (180 mg total) by mouth daily. 30 tablet 1   No current facility-administered medications for this visit.      Musculoskeletal: Strength & Muscle Tone: within normal limits Gait & Station: unsteady, walking with cane Patient leans: N/A  Psychiatric Specialty Exam: Review of Systems   Constitutional: Negative for chills, diaphoresis and fever.  Psychiatric/Behavioral: Negative.     Blood pressure 138/74, pulse (!) 102, height 5\' 9"  (1.753 m), weight 269 lb (122 kg).Body mass index is 39.72 kg/m.  General Appearance: Fairly Groomed  Eye Contact:  Good  Speech:  Clear and Coherent and Normal Rate  Volume:  Normal  Mood:  Euthymic  Affect:  Full Range  Thought Process:  Goal Directed and Descriptions of Associations: Intact  Orientation:  Full (Time, Place, and Person)  Thought Content: Logical   Suicidal Thoughts:  No  Homicidal Thoughts:  No  Memory:  Immediate;   Good Recent;   Good Remote;   Good  Judgement:  Good  Insight:  Good  Psychomotor Activity:  Normal  Concentration:  Concentration: Good and Attention Span: Good  Recall:  Good  Fund of Knowledge: Good  Language: Good  Akathisia:  No  Handed:  Right  AIMS (if indicated): not done  Assets:  Communication Skills Desire for Improvement Housing Leisure Time Social Support Talents/Skills Transportation  ADL's:  Intact  Cognition: WNL  Sleep:  Good   Screenings:  I reviewed the information below on 09/22/2017 and agree except where noted/changed Assessment and Plan: Schizoaffective disorder-bipolar type-currently stable; OCD by history      Medication management with supportive therapy. Risks/benefits and SE of the medication discussed. Pt verbalized understanding and verbal consent obtained for treatment.  Affirm with the patient that the medications are taken as ordered. Patient expressed understanding of how their medications were to be used.      Meds:  Seroquel 600mg  po qD for schizoaffective disorder Abilify 30mg  po qD for schizoaffective disorder Cymbalta 90mg  po qD for mood and OCD Wellbutrin XL 300mg  po qD for mood Pt reports she was unstable with monoantipsychotic therapy. She  has been on this regime for many years and quality of life is good. Symptoms are stable. Benefit  outweights the risk. Pt does not want meds changed today       Labs:  She has signed medical release for her recent lab work done in March by her PCP.  I ordered an EKG   Therapy: brief supportive therapy provided. Discussed psychosocial stressors in detail.       Consultations: Encouraged to follow up with PCP as needed. Encouraged pt to continue therapy   Pt denies SI and is at an acute low risk for suicide. Patient told to call clinic if any problems occur. Patient advised to go to ER if they should develop SI/HI, side effects, or if symptoms worsen. Has crisis numbers to call if needed. Pt verbalized understanding.   F/up in 6 months or sooner if needed    Charlcie Cradle, MD 09/22/2017, 3:40 PM

## 2017-10-25 ENCOUNTER — Ambulatory Visit (INDEPENDENT_AMBULATORY_CARE_PROVIDER_SITE_OTHER): Payer: Medicare Other | Admitting: Psychology

## 2017-10-25 DIAGNOSIS — F250 Schizoaffective disorder, bipolar type: Secondary | ICD-10-CM

## 2017-12-08 ENCOUNTER — Ambulatory Visit (INDEPENDENT_AMBULATORY_CARE_PROVIDER_SITE_OTHER): Payer: Medicare Other | Admitting: Psychology

## 2017-12-08 DIAGNOSIS — F259 Schizoaffective disorder, unspecified: Secondary | ICD-10-CM | POA: Diagnosis not present

## 2017-12-17 ENCOUNTER — Other Ambulatory Visit (HOSPITAL_COMMUNITY): Payer: Self-pay | Admitting: Psychiatry

## 2017-12-17 DIAGNOSIS — F429 Obsessive-compulsive disorder, unspecified: Secondary | ICD-10-CM

## 2017-12-17 DIAGNOSIS — F251 Schizoaffective disorder, depressive type: Secondary | ICD-10-CM

## 2017-12-22 ENCOUNTER — Other Ambulatory Visit (HOSPITAL_COMMUNITY): Payer: Self-pay | Admitting: Psychiatry

## 2017-12-22 DIAGNOSIS — F251 Schizoaffective disorder, depressive type: Secondary | ICD-10-CM

## 2017-12-22 DIAGNOSIS — F429 Obsessive-compulsive disorder, unspecified: Secondary | ICD-10-CM

## 2018-01-19 ENCOUNTER — Other Ambulatory Visit (HOSPITAL_COMMUNITY): Payer: Self-pay | Admitting: Psychiatry

## 2018-01-19 ENCOUNTER — Other Ambulatory Visit (HOSPITAL_COMMUNITY): Payer: Self-pay

## 2018-01-19 DIAGNOSIS — F251 Schizoaffective disorder, depressive type: Secondary | ICD-10-CM

## 2018-01-19 MED ORDER — QUETIAPINE FUMARATE 300 MG PO TABS: 600.0000 mg | ORAL_TABLET | Freq: Every day | ORAL | 0 refills | Status: DC

## 2018-01-19 MED ORDER — ARIPIPRAZOLE 30 MG PO TABS: 30.0000 mg | ORAL_TABLET | Freq: Every day | ORAL | 0 refills | Status: DC

## 2018-01-19 MED ORDER — BUPROPION HCL ER (XL) 300 MG PO TB24: ORAL_TABLET | ORAL | 0 refills | Status: DC

## 2018-01-26 ENCOUNTER — Ambulatory Visit (INDEPENDENT_AMBULATORY_CARE_PROVIDER_SITE_OTHER): Payer: Medicare Other | Admitting: Psychology

## 2018-01-26 DIAGNOSIS — F250 Schizoaffective disorder, bipolar type: Secondary | ICD-10-CM

## 2018-02-01 ENCOUNTER — Emergency Department (HOSPITAL_COMMUNITY): Payer: Medicare Other

## 2018-02-01 ENCOUNTER — Encounter (HOSPITAL_COMMUNITY): Payer: Self-pay | Admitting: *Deleted

## 2018-02-01 ENCOUNTER — Emergency Department (HOSPITAL_COMMUNITY)
Admission: EM | Admit: 2018-02-01 | Discharge: 2018-02-01 | Disposition: A | Discharge status: Home / Self Care | Payer: Medicare Other | Attending: Emergency Medicine | Admitting: Emergency Medicine

## 2018-02-01 DIAGNOSIS — Z7982 Long term (current) use of aspirin: Secondary | ICD-10-CM | POA: Insufficient documentation

## 2018-02-01 DIAGNOSIS — Z79899 Other long term (current) drug therapy: Secondary | ICD-10-CM | POA: Insufficient documentation

## 2018-02-01 DIAGNOSIS — F17210 Nicotine dependence, cigarettes, uncomplicated: Secondary | ICD-10-CM | POA: Insufficient documentation

## 2018-02-01 DIAGNOSIS — I10 Essential (primary) hypertension: Secondary | ICD-10-CM | POA: Diagnosis not present

## 2018-02-01 DIAGNOSIS — R079 Chest pain, unspecified: Secondary | ICD-10-CM | POA: Insufficient documentation

## 2018-02-01 LAB — BASIC METABOLIC PANEL
ANION GAP: 8 (ref 5–15)
BUN: 15 mg/dL (ref 6–20)
CO2: 25 mmol/L (ref 22–32)
Calcium: 9.4 mg/dL (ref 8.9–10.3)
Chloride: 105 mmol/L (ref 98–111)
Creatinine, Ser: 1.18 mg/dL — ABNORMAL HIGH (ref 0.44–1.00)
GFR calc non Af Amer: 50 mL/min — ABNORMAL LOW (ref >60)
GFR, EST AFRICAN AMERICAN: 58 mL/min — AB (ref >60)
Glucose, Bld: 151 mg/dL — ABNORMAL HIGH (ref 70–99)
Potassium: 4 mmol/L (ref 3.5–5.1)
Sodium: 138 mmol/L (ref 135–145)

## 2018-02-01 LAB — CBC
HCT: 40.3 % (ref 36.0–46.0)
HEMOGLOBIN: 13.7 g/dL (ref 12.0–15.0)
MCH: 33.3 pg (ref 26.0–34.0)
MCHC: 34 g/dL (ref 30.0–36.0)
MCV: 97.8 fL (ref 80.0–100.0)
NRBC: 0 % (ref 0.0–0.2)
Platelets: 158 10*3/uL (ref 150–400)
RBC: 4.12 MIL/uL (ref 3.87–5.11)
RDW: 12.5 % (ref 11.5–15.5)
WBC: 3.7 10*3/uL — ABNORMAL LOW (ref 4.0–10.5)

## 2018-02-01 LAB — I-STAT TROPONIN, ED
TROPONIN I, POC: 0 ng/mL (ref 0.00–0.08)
Troponin i, poc: 0.01 ng/mL (ref 0.00–0.08)

## 2018-02-01 LAB — I-STAT BETA HCG BLOOD, ED (MC, WL, AP ONLY)

## 2018-02-01 NOTE — Discharge Instructions (Signed)
You were evaluated in the emergency department for some pain in the left side of your chest.  You had an EKG chest x-ray and blood work that did not show an obvious cause of your symptoms.  Will be important for you to follow-up with your doctor for further evaluation.  Please return if any worsening symptoms.

## 2018-02-01 NOTE — ED Notes (Signed)
Pt ambulated to the restroom and back independently without difficulty.  

## 2018-02-01 NOTE — ED Notes (Signed)
Patient verbalizes understanding of discharge instructions. Opportunity for questioning and answers were provided. Armband removed by staff, pt discharged from ED to home ambulatory.  

## 2018-02-01 NOTE — ED Notes (Signed)
Pt ambulated in hallway, pulse ox on. Oxygen stayed at 99/100 during the walk and after. Pt denies feeling dizzy/faint and or having any chest pain.

## 2018-02-01 NOTE — ED Provider Notes (Signed)
[facility] EMERGENCY DEPARTMENT Provider Note   CSN: [id number] Arrival date & time: 02/01/18  4098     History   Chief Complaint Chief Complaint  Patient presents with  . Chest Pain    HPI Jeanne [name] is a 57 y.o. female.  She has no prior cardiac history.  She is complaining of about a week's worth of generalized fatigue and today she is been experiencing some sharp stabbing left-sided chest pain.  She said the pains last for a few seconds and are intermittent in nature.  They seem to be a little more frequent with exertion.  It radiated into her shoulder and the left side of her neck.  She feels mildly short of breath.  She said she had a chest cold last week that she is getting over.  Minimal cough now nonproductive.  No fevers or chills.  No nausea vomiting diarrhea.  She is a smoker denies street drugs.  The history is provided by the patient.  Chest Pain   This is a new problem. The current episode started 1 to 2 hours ago. The problem has not changed since onset.The pain is associated with exertion. The pain is present in the lateral region. The pain is moderate. The quality of the pain is described as sharp. The pain radiates to the left shoulder and left neck. The symptoms are aggravated by exertion. Associated symptoms include cough, malaise/fatigue and shortness of breath. Pertinent negatives include no abdominal pain, no diaphoresis, no fever, no headaches, no hemoptysis, no leg pain, no lower extremity edema, no nausea, no sputum production and no syncope. She has tried nothing for the symptoms. The treatment provided no relief. Risk factors include smoking/tobacco exposure.  Her past medical history is significant for DVT and hypertension.    Past Medical History:  Diagnosis Date  . Antiphospholipid antibody syndrome ([place])   . Arthritis   . Carpal tunnel syndrome 01/02/2014   Bilateral  . Clotting disorder ([place])   . 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  Depression   . Difficult intubation 07/09/2013   Glidescope used, see Anesthesia note  . DVT (deep venous thrombosis) ([place]) 2014   LEFT LEG  . Gastritis   . Glaucoma   . Heart murmur   . Hypertension   . Hypokalemia 03/05/2013  . Neuromuscular disorder ([place])    " NEUROPATHY IN  MY HANDS"  . Obsessive-compulsive disorder   . Schizo-affective psychosis ([place])   . Scleritis of both eyes   . Sleep apnea    Bipap  . Suppurative hidradenitis     Patient Active Problem List   Diagnosis Date Noted  . Cigarette nicotine dependence without complication 11/91/4782  . OCD (obsessive compulsive disorder) 04/10/2015  . Schizoaffective disorder, depressive type ([place]) 04/10/2015  . Carpal tunnel syndrome 01/02/2014  . Nausea alone 08/01/2013  . Obesity, Class III, BMI 40-49.9 (morbid obesity) ([place]) 07/14/2013  . Ileus ([place]) 07/11/2013  . 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 DVT (deep venous thrombosis) ([place]) 03/22/2013  . Nonspecific abnormal electrocardiogram (ECG) (EKG) 03/05/2013  . Paresthesia 03/05/2013  . Accelerated hypertension 03/05/2013  . Schizoaffective disorder ([place]) 03/05/2013  . Obsessive compulsive disorder 03/05/2013  . Depression 03/05/2013  . Obstructive sleep apnea 03/05/2013  . Glaucoma 03/05/2013  . 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 BUNIONECTOMY    . CERVICAL CONIZATION W/BX    .  LAPAROSCOPIC ADRENALECTOMY Left 07/09/2013   Procedure: LAPAROSCOPIC LEFT  ADRENALECTOMY;  Surgeon: Earnstine Regal, MD;  Location: [facility];  Service: General;  Laterality: Left;  . LYMPH NODE DISSECTION       OB History   None      Home Medications    Prior to Admission medications   Medication Sig Start Date End Date Taking? Authorizing Provider  amLODipine (NORVASC) 5 MG tablet Take 5 mg by mouth every morning.     [provider]  ARIPiprazole  (ABILIFY) 30 MG tablet Take 1 tablet (30 mg total) by mouth daily. 01/19/18   Charlcie Cradle, MD  aspirin EC 81 MG tablet Take 162 mg by mouth daily.    [provider]  atorvastatin (LIPITOR) 20 MG tablet Take 1 tablet by mouth daily. 10/22/14   [provider]  buPROPion (WELLBUTRIN XL) 300 MG 24 hr tablet TAKE 1 TABLET(300 MG) BY MOUTH EVERY MORNING 01/19/18   Charlcie Cradle, MD  Calcium Carb-Cholecalciferol 600-800 MG-UNIT TABS Take 1 tablet by mouth daily.    [provider]  DULoxetine (CYMBALTA) 30 MG capsule TAKE 3 CAPSULES BY MOUTH EVERY MORNING 12/22/17   Charlcie Cradle, MD  esomeprazole (NEXIUM) 40 MG capsule Take 40 mg by mouth daily before breakfast.    [provider]  glucosamine-chondroitin 500-400 MG tablet Take 2 tablets by mouth every morning.    [provider]  Javier Docker Oil 300 MG CAPS Take 300 mg by mouth daily.    [provider]  leflunomide (ARAVA) 20 MG tablet Take 1 tablet by mouth daily. Reported on 07/10/2015 10/22/14   [provider]  levobunolol (BETAGAN) 0.5 % ophthalmic solution Place 1 drop into both eyes every morning.     [provider]  lubiprostone (AMITIZA) 24 MCG capsule Take 24 mcg by mouth 2 (two) times daily with a meal.    [provider]  Multiple Vitamin (MULTIVITAMIN WITH MINERALS) TABS tablet Take 1 tablet by mouth daily.    [provider]  NIFEdipine (PROCARDIA-XL/ADALAT CC) 60 MG 24 hr tablet Take 1 tablet (60 mg total) by mouth daily. 07/16/13   Armandina Gemma, MD  oxyCODONE-acetaminophen (PERCOCET/ROXICET) 5-325 MG tablet Take 1-2 tablets by mouth every 4 (four) hours as needed for severe pain.    [provider]  PRESCRIPTION MEDICATION Inhale 1 application into the lungs at bedtime. BiPAP setting 8/12    [provider]  QUEtiapine (SEROQUEL) 300 MG tablet Take 2 tablets (600 mg total) by mouth at bedtime. 01/19/18   Charlcie Cradle, MD  verapamil  (CALAN-SR) 180 MG CR tablet Take 1 tablet (180 mg total) by mouth daily. 07/16/13   Armandina Gemma, MD    Family History Family History  Problem Relation Age of Onset  . Cancer Maternal Aunt        breast ca  . Heart attack Maternal Grandmother 54  . Heart attack Maternal Grandfather 62  . Hypertension Sister   . Hypertension Sister   . Seizures Maternal Uncle   . Alcohol abuse Neg Hx   . Anxiety disorder Neg Hx   . Bipolar disorder Neg Hx   . Depression Neg Hx   . Drug abuse Neg Hx   . Schizophrenia Neg Hx     Social History Social History   Tobacco Use  . Smoking status: Current Every Day Smoker    Packs/day: 0.50    Years: 25.00    Pack years: 12.50    Types:  Cigarettes  . Smokeless tobacco: Never Used  Substance Use Topics  . Alcohol use: No    Alcohol/week: 0.0 standard drinks    Comment: " drink just on weekends " Last drink was in the spring of 2016  . Drug use: No     Allergies   Ace inhibitors; Arava [leflunomide]; and Travatan [travoprost]   Review of Systems Review of Systems  Constitutional: Positive for malaise/fatigue. Negative for diaphoresis and fever.  HENT: Negative for sore throat.   Eyes: Negative for visual disturbance.  Respiratory: Positive for cough and shortness of breath. Negative for hemoptysis and sputum production.   Cardiovascular: Positive for chest pain. Negative for syncope.  Gastrointestinal: Negative for abdominal pain and nausea.  Genitourinary: Negative for dysuria.  Musculoskeletal: Negative for neck pain.  Skin: Negative for rash.  Neurological: Negative for headaches.     Physical Exam Updated Vital Signs BP (!) 143/78 (BP Location: Right Arm)   Pulse 91   Temp 98 F (36.7 C) (Oral)   Resp 20   SpO2 100%   Physical Exam  Constitutional: She appears well-developed and well-nourished. No distress.  HENT:  Head: Normocephalic and atraumatic.  Eyes: Conjunctivae are normal.  Neck: Neck supple.  Cardiovascular:  Normal rate, regular rhythm, intact distal pulses and normal pulses.  No murmur heard. Pulmonary/Chest: Effort normal and breath sounds normal. No respiratory distress. She exhibits tenderness.  Abdominal: Soft. There is no tenderness.  Musculoskeletal: She exhibits no edema or deformity.       Right lower leg: She exhibits no tenderness and no edema.       Left lower leg: She exhibits no tenderness and no edema.  Neurological: She is alert.  Skin: Skin is warm and dry. Capillary refill takes less than 2 seconds.  Psychiatric: She has a normal mood and affect.  Nursing note and vitals reviewed.    ED Treatments / Results  Labs (all labs ordered are listed, but only abnormal results are displayed) Labs Reviewed  BASIC METABOLIC PANEL - Abnormal; Notable for the following components:      Result Value   Glucose, Bld 151 (*)    Creatinine, Ser 1.18 (*)    GFR calc non Af Amer 50 (*)    GFR calc Af Amer 58 (*)    All other components within normal limits  CBC - Abnormal; Notable for the following components:   WBC 3.7 (*)    All other components within normal limits  I-STAT TROPONIN, ED  I-STAT BETA HCG BLOOD, ED ([facility], [facility], [facility] ONLY)  I-STAT TROPONIN, ED    EKG EKG Interpretation  Date/Time:  Wednesday February 01 2018 07:52:32 EDT Ventricular Rate:  82 PR Interval:  178 QRS Duration: 142 QT Interval:  410 QTC Calculation: 479 R Axis:   -24 Text Interpretation:  Normal sinus rhythm Right bundle branch block Abnormal ECG new rbbb and twi from prior 6/17 Confirmed by Aletta Edouard [phone]) on 02/01/2018 8:13:13 AM   Radiology Dg Chest 1 View  Result Date: 02/01/2018 CLINICAL DATA:  Possible nodule at the left lung base on chest x-ray dated 02/01/2018 at 8:05 a.m. EXAM: CHEST  1 VIEW with nipple markers 8:57 a.m. COMPARISON:  Radiographs dated 02/01/2018 and 10/22/2014 and CT scan of the abdomen dated 08/04/2015 FINDINGS: The small density adjacent to the left ventricular apex  on the prior exam is most likely a focal area of pericardial fat as demonstrated on the prior abdominal CT scan. This does not represent a  nipple shadow. The lungs are otherwise clear. Heart size and vascularity are normal. Aortic atherosclerosis. No acute bone abnormality. IMPRESSION: The vague density adjacent to the left ventricular apex is felt represent benign pericardial fat. No acute abnormality. Aortic Atherosclerosis (ICD10-I70.0). Electronically Signed   By: Lorriane Shire M.D.   On: 02/01/2018 09:30   Dg Chest 2 View  Result Date: 02/01/2018 CLINICAL DATA:  Chest pain.  Fatigue. EXAM: CHEST - 2 VIEW COMPARISON:  10/22/2014 FINDINGS: 9 mm nodular opacity projecting which may reflect nipple shadow versus pulmonary nodule. There is no focal consolidation. There is no pleural effusion or pneumothorax. The heart and mediastinal contours are unremarkable. The osseous structures are unremarkable. IMPRESSION: 1. No acute cardiopulmonary disease. 2. 9 mm nodular opacity projecting which may reflect nipple shadow versus pulmonary nodule. Recommend repeat chest x-ray with nipple markers. Electronically Signed   By: Kathreen Devoid   On: 02/01/2018 08:32    Procedures Procedures (including critical care time)  Medications Ordered in ED Medications - No data to display   Initial Impression / Assessment and Plan / ED Course  I have reviewed the triage vital signs and the nursing notes.  Pertinent labs & imaging results that were available during my care of the patient were reviewed by me and considered in my medical decision making (see chart for details).  Clinical Course as of Feb 02 1604  Wed Feb 01, 2018  0804 EKG today normal sinus rhythm rate of 82 right bundle branch block pattern with T wave inversions anterior and inferior that are new from prior EKG 6/17.   [MB]  4147 57 year old female smoker here with sharp stabbing intermittent chest pain and a week's worth of generalized fatigue.  Her  EKG shows a new right bundle branch block pattern.  She is a history of DVT and not on anticoagulation but her sats are 100% on room air.  Pain is not pleuritic in nature.   [MB]  1004 Patient's initial lab work fairly unremarkable.  EKG and chest x-ray also unremarkable.  I reviewed this with the patient and asked if she would stick around so we can get a second troponin she is in agreement.   [MB]  0177 Patient second troponin is also negative.  She will be discharged to follow-up with her primary care doctor.   [MB]  1227 trending pulse ox 99% on room air with no shortness of breath.   [MB]    Clinical Course User Index [MB] Hayden Rasmussen, MD      Final Clinical Impressions(s) / ED Diagnoses   Final diagnoses:  Nonspecific chest pain    ED Discharge Orders    None       Hayden Rasmussen, MD 02/01/18 [phone]

## 2018-02-01 NOTE — ED Triage Notes (Signed)
Pt in c/o chest pain that started this morning, pain to left sided of chest and is intermittent, reports fatigue for the last week as well, denies shortness of breath, no distress noted

## 2018-02-01 NOTE — ED Notes (Signed)
Pt taken to chest x-ray

## 2018-03-08 ENCOUNTER — Other Ambulatory Visit (HOSPITAL_COMMUNITY): Payer: Self-pay | Admitting: Internal Medicine

## 2018-03-08 DIAGNOSIS — R600 Localized edema: Secondary | ICD-10-CM

## 2018-03-08 DIAGNOSIS — R0600 Dyspnea, unspecified: Secondary | ICD-10-CM

## 2018-03-08 DIAGNOSIS — R0609 Other forms of dyspnea: Secondary | ICD-10-CM

## 2018-03-20 ENCOUNTER — Other Ambulatory Visit (HOSPITAL_COMMUNITY): Payer: Self-pay

## 2018-03-23 ENCOUNTER — Ambulatory Visit (INDEPENDENT_AMBULATORY_CARE_PROVIDER_SITE_OTHER): Payer: Medicare Other | Admitting: Psychology

## 2018-03-23 DIAGNOSIS — F250 Schizoaffective disorder, bipolar type: Secondary | ICD-10-CM

## 2018-03-24 ENCOUNTER — Ambulatory Visit (HOSPITAL_COMMUNITY): Payer: Medicare Other | Attending: Cardiology

## 2018-03-24 ENCOUNTER — Other Ambulatory Visit: Payer: Self-pay

## 2018-03-24 DIAGNOSIS — R0609 Other forms of dyspnea: Secondary | ICD-10-CM | POA: Insufficient documentation

## 2018-03-24 DIAGNOSIS — R600 Localized edema: Secondary | ICD-10-CM | POA: Insufficient documentation

## 2018-03-24 DIAGNOSIS — R0600 Dyspnea, unspecified: Secondary | ICD-10-CM

## 2018-03-30 ENCOUNTER — Ambulatory Visit (INDEPENDENT_AMBULATORY_CARE_PROVIDER_SITE_OTHER): Payer: Medicare Other | Admitting: Psychiatry

## 2018-03-30 ENCOUNTER — Encounter (HOSPITAL_COMMUNITY): Payer: Self-pay | Admitting: Psychiatry

## 2018-03-30 DIAGNOSIS — F251 Schizoaffective disorder, depressive type: Secondary | ICD-10-CM | POA: Diagnosis not present

## 2018-03-30 DIAGNOSIS — F429 Obsessive-compulsive disorder, unspecified: Secondary | ICD-10-CM

## 2018-03-30 MED ORDER — DULOXETINE HCL 30 MG PO CPEP: 90.0000 mg | ORAL_CAPSULE | Freq: Every day | ORAL | 1 refills | Status: DC

## 2018-03-30 MED ORDER — ARIPIPRAZOLE 30 MG PO TABS: 30.0000 mg | ORAL_TABLET | Freq: Every day | ORAL | 1 refills | Status: DC

## 2018-03-30 MED ORDER — BUPROPION HCL ER (XL) 300 MG PO TB24: ORAL_TABLET | ORAL | 1 refills | Status: DC

## 2018-03-30 MED ORDER — QUETIAPINE FUMARATE 300 MG PO TABS: 600.0000 mg | ORAL_TABLET | Freq: Every day | ORAL | 1 refills | Status: DC

## 2018-03-30 NOTE — Progress Notes (Signed)
[facility] MD/PA/NP OP Progress Note  03/30/2018 3:12 PM Jeanne [name]  MRN:  [id number]  Chief Complaint:  Chief Complaint    Follow-up; Medication Refill      HPI: Patient tells me that she is doing well.  She is working on putting together a book of her memoirs concerning how her schizophrenia has impacted her religion.  She states that it is all up in the air but she is interested to see where this could lead.  She is denying any symptoms of depression or anxiety.  She denies any manic and hypomanic-like symptoms.  She denies any psychosis including hallucinations, paranoia, ideas of reference or magical thinking.  She denies any suicidal or homicidal ideations.  Patient reports that she is eating well and is continuing to gain weight.  She is sleeping about 6 hours a night and wakes up feeling rested.  She remains active in her church committees.  She is spending time with family and friends.  She has a fear in the back of her mind that 1 day her medications will stop working but finds ways to reassure herself.  She is taking her medications as prescribed and feels that this combination has been very effective for her.  Visit Diagnosis:    ICD-10-CM   1. Schizoaffective disorder, depressive type ([place]) F25.1 ARIPiprazole (ABILIFY) 30 MG tablet    buPROPion (WELLBUTRIN XL) 300 MG 24 hr tablet    DULoxetine (CYMBALTA) 30 MG capsule    QUEtiapine (SEROQUEL) 300 MG tablet  2. Obsessive-compulsive disorder, unspecified type F42.9 DULoxetine (CYMBALTA) 30 MG capsule    Past Psychiatric History:  Dx: Schizoaffective disorder- depressed type dx in 2001; OCD hx in 1994 Meds: Risperdal, Zyprexa, Prolixin, Prozac, Zoloft, Effexor, Lamictal Previous psychiatrist/therapist: Dr. Wylene Simmer last seen in Aug 2016 due to insurance reasons had to stop Hospitalizations: 5-6 times, last time was in 2004 in [place].[name] 2 yr outpatient day program in [place]. , 1 yr of outpt at [facility] and 3 hospitalizations at [facility] and  [facility].   SIB: denies Suicide attempts: denies, but did have many years of SI Hx of violent behavior towards others: denies Current access to guns: debues Hx of abuse: denies Military Hx: denies   Past Medical History:  Past Medical History:  Diagnosis Date  . Antiphospholipid antibody syndrome ([place])   . Arthritis   . Carpal tunnel syndrome 01/02/2014   Bilateral  . Clotting disorder ([place])   . 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 Depression   . Difficult intubation 07/09/2013   Glidescope used, see Anesthesia note  . DVT (deep venous thrombosis) ([place]) 2014   LEFT LEG  . Gastritis   . Glaucoma   . Heart murmur   . Hypertension   . Hypokalemia 03/05/2013  . Neuromuscular disorder ([place])    " NEUROPATHY IN  MY HANDS"  . Obsessive-compulsive disorder   . Schizo-affective psychosis ([place])   . Scleritis of both eyes   . Sleep apnea    Bipap  . Suppurative hidradenitis     Past Surgical History:  Procedure Laterality Date  . BUNIONECTOMY    . CERVICAL CONIZATION W/BX    . LAPAROSCOPIC ADRENALECTOMY Left 07/09/2013   Procedure: LAPAROSCOPIC LEFT  ADRENALECTOMY;  Surgeon: Earnstine Regal, MD;  Location: [facility];  Service: General;  Laterality: Left;  . LYMPH NODE DISSECTION      Family Psychiatric History: Family History  Problem Relation Age of Onset  . Cancer Maternal Aunt  breast ca  . Heart attack Maternal Grandmother 54  . Heart attack Maternal Grandfather 62  . Hypertension Sister   . Hypertension Sister   . Seizures Maternal Uncle   . Alcohol abuse Neg Hx   . Anxiety disorder Neg Hx   . Bipolar disorder Neg Hx   . Depression Neg Hx   . Drug abuse Neg Hx   . Schizophrenia Neg Hx     Social History:  Social History   Socioeconomic History  . Marital status: Single    Spouse name: Not on file  . Number of children: 0  . Years of education: college  . Highest education level: Not on file  Occupational History  . Occupation: Retired  Scientific laboratory technician  . Financial  resource strain: Not on file  . Food insecurity:    Worry: Not on file    Inability: Not on file  . Transportation needs:    Medical: Not on file    Non-medical: Not on file  Tobacco Use  . Smoking status: Current Every Day Smoker    Packs/day: 0.50    Years: 25.00    Pack years: 12.50    Types: Cigarettes  . Smokeless tobacco: Never Used  Substance and Sexual Activity  . Alcohol use: No    Alcohol/week: 0.0 standard drinks    Comment: " drink just on weekends " Last drink was in the spring of 2016  . Drug use: No  . Sexual activity: Not Currently  Lifestyle  . Physical activity:    Days per week: Not on file    Minutes per session: Not on file  . Stress: Not on file  Relationships  . Social connections:    Talks on phone: Not on file    Gets together: Not on file    Attends religious service: Not on file    Active member of club or organization: Not on file    Attends meetings of clubs or organizations: Not on file    Relationship status: Not on file  Other Topics Concern  . Not on file  Social History Narrative   Pt is living in [place] with mother. Born and raised in [place] by parents. Childhood was fine. Pt has 3 older sisters. Pt has a law degree. 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 Single, no kids.     Allergies:  Allergies  Allergen Reactions  . Ace Inhibitors Anaphylaxis  . Arava [Leflunomide] Other (See Comments)    Nephritis and kidney clearance issues  . Travatan [Travoprost] Other (See Comments)    Red eyes, irritation     Metabolic Disorder Labs: No results found for: HGBA1C, MPG No results found for: PROLACTIN No results found for: CHOL, TRIG, HDL, CHOLHDL, VLDL, LDLCALC Lab Results  Component Value Date   TSH 1.639 03/05/2013    Therapeutic Level Labs: No results found for: LITHIUM No results found for: VALPROATE No components found for:  CBMZ  Current Medications: Current  Outpatient Medications  Medication Sig Dispense Refill  . amLODipine (NORVASC) 5 MG tablet Take 5 mg by mouth every morning.     . ARIPiprazole (ABILIFY) 30 MG tablet Take 1 tablet (30 mg total) by mouth daily. 90 tablet 1  . atorvastatin (LIPITOR) 20 MG tablet Take 1 tablet by mouth daily.    [name] buPROPion (WELLBUTRIN XL) 300 MG 24 hr tablet TAKE 1 TABLET(300 MG) BY MOUTH EVERY MORNING 90  tablet 1  . Calcium Carb-Cholecalciferol 600-800 MG-UNIT TABS Take 1 tablet by mouth daily.    . DULoxetine (CYMBALTA) 30 MG capsule Take 3 capsules (90 mg total) by mouth daily. 90 capsule 1  . esomeprazole (NEXIUM) 40 MG capsule Take 40 mg by mouth daily before breakfast.    . etanercept (ENBREL SURECLICK) 50 MG/ML injection Inject 50 mg into the skin once a week. Tuesday    . glucosamine-chondroitin 500-400 MG tablet Take 2 tablets by mouth every morning.    Javier Docker Oil 300 MG CAPS Take 300 mg by mouth daily.    [name] levobunolol (BETAGAN) 0.5 % ophthalmic solution Place 1 drop into both eyes every morning.     . lubiprostone (AMITIZA) 24 MCG capsule Take 24 mcg by mouth every Monday, Wednesday, and Friday.     . Multiple Vitamin (MULTIVITAMIN WITH MINERALS) TABS tablet Take 1 tablet by mouth daily.    [name] NIFEdipine (PROCARDIA-XL/ADALAT CC) 60 MG 24 hr tablet Take 1 tablet (60 mg total) by mouth daily. 30 tablet 1  . oxyCODONE-acetaminophen (PERCOCET/ROXICET) 5-325 MG tablet Take 1-2 tablets by mouth every 4 (four) hours as needed for severe pain.    [name] PRESCRIPTION MEDICATION Inhale 1 application into the lungs at bedtime. BiPAP setting 8/12    . QUEtiapine (SEROQUEL) 300 MG tablet Take 2 tablets (600 mg total) by mouth at bedtime. 180 tablet 1  . Turmeric POWD Take 450 mcg by mouth daily at 12 noon.    . verapamil (CALAN-SR) 180 MG CR tablet Take 1 tablet (180 mg total) by mouth daily. 30 tablet 1  . aspirin EC 81 MG tablet Take 162 mg by mouth daily.     No current facility-administered medications for this  visit.      Musculoskeletal: Strength & Muscle Tone: within normal limits Gait & Station: broad based, walking with cane Patient leans: N/A  Psychiatric Specialty Exam: Review of Systems  Constitutional: Negative for chills, diaphoresis and fever.  Respiratory: Negative for cough, shortness of breath and wheezing.     Blood pressure 121/74, pulse 76, resp. rate 12, height 5\' 10"  (1.778 m), weight 293 lb 6.4 oz (133.1 kg).Body mass index is 42.1 kg/m.  General Appearance: Fairly Groomed  Eye Contact:  Good  Speech:  Clear and Coherent and Normal Rate  Volume:  Normal  Mood:  Euthymic  Affect:  Restricted  Thought Process:  Goal Directed and Descriptions of Associations: Intact  Orientation:  Full (Time, Place, and Person)  Thought Content:  Logical  Suicidal Thoughts:  No  Homicidal Thoughts:  No  Memory:  Immediate;   Good  Judgement:  Good  Insight:  Good  Psychomotor Activity:  Normal  Concentration:  Concentration: Good  Recall:  Good  Fund of Knowledge:  Good  Language:  Good  Akathisia:  No  Handed:  Right  AIMS (if indicated):     Assets:  Communication Skills Desire for Improvement Financial Resources/Insurance Housing Leisure Time Resilience Social Support Talents/Skills Transportation Vocational/Educational  ADL's:  Intact  Cognition:  WNL  Sleep:   good     Screenings:  I reviewed the information below on 03/30/2018 and have updated it Assessment and Plan: Schizoaffective disorder-bipolar type-currently stable; OCD by history      Medication management with supportive therapy. Risks/benefits and SE of the medication discussed. Pt verbalized understanding and verbal consent obtained for treatment.  Affirm with the patient that the medications are taken as ordered. Patient expressed understanding of how  their medications were to be used.      Meds:  Seroquel 600mg  po qD for schizoaffective disorder Abilify 30mg  po qD for schizoaffective  disorder Cymbalta 90mg  po qD for mood and OCD Wellbutrin XL 300mg  po qD for mood Pt reports she was unstable with monoantipsychotic therapy. She has been on this regime for many years and quality of life is good. Symptoms are stable. Benefit outweights the risk. Pt does not want meds changed today       Labs:  Patient had an EKG done on February 01, 2018 which showed a QTC of 479.  That is increased as compared to her previous EKG.  Patient tells me that she saw her primary care doctor recently and had an echo done a couple weeks ago.  She followed up with the cardiologist who told her that there is nothing concerning going on.   Therapy: brief supportive therapy provided. Discussed psychosocial stressors in detail.       Consultations: Encouraged to follow up with PCP as needed.  Encouraged pt to continue therapy   Pt denies SI and is at an acute low risk for suicide. Patient told to call clinic if any problems occur. Patient advised to go to ER if they should develop SI/HI, side effects, or if symptoms worsen. Has crisis numbers to call if needed. Pt verbalized understanding.   F/up in 6 months or sooner if needed-patient request    Charlcie Cradle, MD 03/30/2018, 3:12 PM

## 2018-05-11 ENCOUNTER — Ambulatory Visit (INDEPENDENT_AMBULATORY_CARE_PROVIDER_SITE_OTHER): Payer: Medicare Other | Admitting: Psychology

## 2018-05-11 DIAGNOSIS — F250 Schizoaffective disorder, bipolar type: Secondary | ICD-10-CM | POA: Diagnosis not present

## 2018-06-15 ENCOUNTER — Other Ambulatory Visit (HOSPITAL_COMMUNITY): Payer: Self-pay | Admitting: Psychiatry

## 2018-06-15 DIAGNOSIS — F251 Schizoaffective disorder, depressive type: Secondary | ICD-10-CM

## 2018-06-15 DIAGNOSIS — F429 Obsessive-compulsive disorder, unspecified: Secondary | ICD-10-CM

## 2018-06-20 ENCOUNTER — Ambulatory Visit (INDEPENDENT_AMBULATORY_CARE_PROVIDER_SITE_OTHER): Payer: Medicare Other | Admitting: Psychology

## 2018-06-20 DIAGNOSIS — F250 Schizoaffective disorder, bipolar type: Secondary | ICD-10-CM

## 2018-06-29 ENCOUNTER — Other Ambulatory Visit (HOSPITAL_COMMUNITY): Payer: Self-pay | Admitting: Psychiatry

## 2018-06-29 DIAGNOSIS — F251 Schizoaffective disorder, depressive type: Secondary | ICD-10-CM

## 2018-06-29 DIAGNOSIS — F429 Obsessive-compulsive disorder, unspecified: Secondary | ICD-10-CM

## 2018-07-04 ENCOUNTER — Other Ambulatory Visit: Payer: Self-pay

## 2018-07-04 DIAGNOSIS — R6889 Other general symptoms and signs: Secondary | ICD-10-CM

## 2018-07-06 ENCOUNTER — Other Ambulatory Visit (HOSPITAL_COMMUNITY): Payer: Self-pay

## 2018-07-06 DIAGNOSIS — F251 Schizoaffective disorder, depressive type: Secondary | ICD-10-CM

## 2018-07-06 DIAGNOSIS — F429 Obsessive-compulsive disorder, unspecified: Secondary | ICD-10-CM

## 2018-07-06 MED ORDER — DULOXETINE HCL 30 MG PO CPEP: 90.0000 mg | ORAL_CAPSULE | Freq: Every day | ORAL | 0 refills | Status: DC

## 2018-07-11 LAB — NOVEL CORONAVIRUS, NAA: SARS-CoV-2, NAA: NOT DETECTED

## 2018-07-17 ENCOUNTER — Other Ambulatory Visit: Payer: Self-pay | Admitting: Internal Medicine

## 2018-07-17 ENCOUNTER — Telehealth (HOSPITAL_COMMUNITY): Payer: Self-pay | Admitting: Emergency Medicine

## 2018-07-17 DIAGNOSIS — Z720 Tobacco use: Secondary | ICD-10-CM

## 2018-07-17 NOTE — Telephone Encounter (Signed)
Your test for COVID-19 was negative.  Please continue good preventive care measures, including:  frequent hand-washing, avoid touching your face, cover coughs/sneezes, stay out of crowds and keep a 6 foot distance from others.  If you develop fever/cough/breathlessness, please stay home for 7 days and until you have had 3 consecutive days with cough/breathlessness improving and without fever (without taking a fever reducer). Go to the nearest hospital ED tent for assessment if fever/cough/breathlessness are severe or illness seems like a threat to life.  Attempted to reach patient. No answer at this time. Voicemail left.   

## 2018-07-20 ENCOUNTER — Telehealth (HOSPITAL_COMMUNITY): Payer: Self-pay | Admitting: Emergency Medicine

## 2018-07-20 NOTE — Telephone Encounter (Signed)
Attempted to reach patient x2. No answer at this time.

## 2018-07-31 ENCOUNTER — Ambulatory Visit (INDEPENDENT_AMBULATORY_CARE_PROVIDER_SITE_OTHER): Payer: Medicare Other | Admitting: Psychology

## 2018-07-31 DIAGNOSIS — F250 Schizoaffective disorder, bipolar type: Secondary | ICD-10-CM

## 2018-09-07 ENCOUNTER — Ambulatory Visit (INDEPENDENT_AMBULATORY_CARE_PROVIDER_SITE_OTHER): Payer: Medicare Other | Admitting: Psychology

## 2018-09-07 DIAGNOSIS — F250 Schizoaffective disorder, bipolar type: Secondary | ICD-10-CM

## 2018-09-28 ENCOUNTER — Encounter (HOSPITAL_COMMUNITY): Payer: Self-pay | Admitting: Psychiatry

## 2018-09-28 ENCOUNTER — Other Ambulatory Visit: Payer: Self-pay

## 2018-09-28 ENCOUNTER — Ambulatory Visit (INDEPENDENT_AMBULATORY_CARE_PROVIDER_SITE_OTHER): Payer: Medicare Other | Admitting: Psychiatry

## 2018-09-28 DIAGNOSIS — F429 Obsessive-compulsive disorder, unspecified: Secondary | ICD-10-CM

## 2018-09-28 DIAGNOSIS — F251 Schizoaffective disorder, depressive type: Secondary | ICD-10-CM | POA: Diagnosis not present

## 2018-09-28 MED ORDER — DULOXETINE HCL 30 MG PO CPEP: 90.0000 mg | ORAL_CAPSULE | Freq: Every day | ORAL | 1 refills | Status: DC

## 2018-09-28 MED ORDER — QUETIAPINE FUMARATE 300 MG PO TABS: 600.0000 mg | ORAL_TABLET | Freq: Every day | ORAL | 1 refills | Status: DC

## 2018-09-28 MED ORDER — ARIPIPRAZOLE 30 MG PO TABS: 30.0000 mg | ORAL_TABLET | Freq: Every day | ORAL | 1 refills | Status: DC

## 2018-09-28 MED ORDER — BUPROPION HCL ER (XL) 300 MG PO TB24: ORAL_TABLET | ORAL | 1 refills | Status: DC

## 2018-09-28 NOTE — Progress Notes (Signed)
Virtual Visit via Telephone Note  I connected with Jeanne [name] on 09/28/18 at  2:00 PM EDT by telephone and verified that I am speaking with the correct person using two identifiers.  Location: Patient: Home Provider: Office   I discussed the limitations, risks, security and privacy concerns of performing an evaluation and management service by telephone and the availability of in person appointments. I also discussed with the patient that there may be a patient responsible charge related to this service. The patient expressed understanding and agreed to proceed.   History of Present Illness: "I have been doing well and I have no complaints". She is "dealing with the pandemic just like everyone is". Jeanne [name] is finding ways to stay busy by cleaning, walking and has been spending some time with a friend. Pt had pneumonia in March and was given steroids for 3 weeks. It caused her blood sugar to sky rocket. She is now diabetic and it has been a big change for her. Since March she has been having long zoom meetings 2-3x/weeks. She felt she was doing well on the committee but when she got sick she got several nasty emails. 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 It was a hard decision but she is glad she did. It has relieved a lot of her stress and anxiety. Pt denies depression and manic/hypomanic like symptoms. Pt denies ideas of reference, paranoia and AVH. Overall she feels she is doing well and has no mental health concerns at this time.   Observations/Objective: I spoke with Jeanne [name] on the phone.  Pt was calm, pleasant and cooperative.  Pt was engaged in the conversation and answered questions appropriately.  Speech was clear and coherent with normal rate, tone and volume.  Mood is euthymic, affect is appropriate and full. Thought processes are coherent and intact.  Thought content is with ruminations and overall logical.  Pt denies SI/HI.   Pt denies auditory  and visual hallucinations and did not appear to be responding to internal stimuli.  Memory and concentration are good.  Fund of knowledge and use of language are average.  Insight and judgment are fair.  I am unable to comment on psychomotor activity, general appearance, hygiene, or eye contact as I was unable to physically see the patient on the phone.   Assessment and Plan: Schizoaffective disorder-bipolar type; OCD by history  Seroquel 600 mg p.o. q. at bedtime for schizoaffective disorder Abilify 30 mg p.o. daily for schizoaffective disorder Cymbalta 90 mg p.o. daily for mood and OCD Wellbutrin XL 300 mg p.o. daily for mood Patient reports she was unstable with mono antipsychotic therapy.  She is been on this regime of medications for many years and reports good quality of life.  Symptoms are stable.  Benefit outweighs the risk.  Patient does not want her medications changed.  Follow Up Instructions: Follow-up in 6 months or sooner if needed   I discussed the assessment and treatment plan with the patient. The patient was provided an opportunity to ask questions and all were answered. The patient agreed with the plan and demonstrated an understanding of the instructions.   The patient was advised to call back or seek an in-person evaluation if the symptoms worsen or if the condition fails to improve as anticipated.  I provided 15 minutes of non-face-to-face time during this encounter.   Charlcie Cradle, MD

## 2018-10-02 ENCOUNTER — Ambulatory Visit (INDEPENDENT_AMBULATORY_CARE_PROVIDER_SITE_OTHER): Payer: Medicare Other | Admitting: Psychology

## 2018-10-02 DIAGNOSIS — F250 Schizoaffective disorder, bipolar type: Secondary | ICD-10-CM | POA: Diagnosis not present

## 2018-10-23 ENCOUNTER — Ambulatory Visit
Admission: RE | Admit: 2018-10-23 | Discharge: 2018-10-23 | Disposition: A | Discharge status: Home / Self Care | Payer: Medicare Other | Source: Ambulatory Visit | Attending: Internal Medicine | Admitting: Internal Medicine

## 2018-10-23 DIAGNOSIS — Z720 Tobacco use: Secondary | ICD-10-CM

## 2018-11-02 ENCOUNTER — Ambulatory Visit (INDEPENDENT_AMBULATORY_CARE_PROVIDER_SITE_OTHER): Payer: Medicare Other | Admitting: Psychology

## 2018-11-02 DIAGNOSIS — F250 Schizoaffective disorder, bipolar type: Secondary | ICD-10-CM

## 2018-11-27 ENCOUNTER — Ambulatory Visit (INDEPENDENT_AMBULATORY_CARE_PROVIDER_SITE_OTHER): Payer: Medicare Other | Admitting: Psychology

## 2018-11-27 DIAGNOSIS — F250 Schizoaffective disorder, bipolar type: Secondary | ICD-10-CM | POA: Diagnosis not present

## 2018-12-29 ENCOUNTER — Ambulatory Visit (INDEPENDENT_AMBULATORY_CARE_PROVIDER_SITE_OTHER): Payer: Medicare Other | Admitting: Psychology

## 2018-12-29 DIAGNOSIS — F251 Schizoaffective disorder, depressive type: Secondary | ICD-10-CM | POA: Diagnosis not present

## 2019-01-02 ENCOUNTER — Encounter: Payer: Self-pay | Admitting: Sports Medicine

## 2019-01-02 ENCOUNTER — Other Ambulatory Visit: Payer: Self-pay

## 2019-01-02 ENCOUNTER — Ambulatory Visit (INDEPENDENT_AMBULATORY_CARE_PROVIDER_SITE_OTHER): Payer: Medicare Other | Admitting: Sports Medicine

## 2019-01-02 DIAGNOSIS — M79674 Pain in right toe(s): Secondary | ICD-10-CM

## 2019-01-02 DIAGNOSIS — M79675 Pain in left toe(s): Secondary | ICD-10-CM

## 2019-01-02 DIAGNOSIS — E1141 Type 2 diabetes mellitus with diabetic mononeuropathy: Secondary | ICD-10-CM | POA: Diagnosis not present

## 2019-01-02 DIAGNOSIS — M2041 Other hammer toe(s) (acquired), right foot: Secondary | ICD-10-CM

## 2019-01-02 DIAGNOSIS — I739 Peripheral vascular disease, unspecified: Secondary | ICD-10-CM | POA: Diagnosis not present

## 2019-01-02 DIAGNOSIS — B351 Tinea unguium: Secondary | ICD-10-CM | POA: Diagnosis not present

## 2019-01-02 DIAGNOSIS — M2142 Flat foot [pes planus] (acquired), left foot: Secondary | ICD-10-CM

## 2019-01-02 DIAGNOSIS — M2141 Flat foot [pes planus] (acquired), right foot: Secondary | ICD-10-CM | POA: Diagnosis not present

## 2019-01-02 DIAGNOSIS — M2042 Other hammer toe(s) (acquired), left foot: Secondary | ICD-10-CM

## 2019-01-02 DIAGNOSIS — L84 Corns and callosities: Secondary | ICD-10-CM

## 2019-01-02 NOTE — Progress Notes (Signed)
Subjective: Jeanne [name] is a 58 y.o. female patient with history of diabetes who presents to office today complaining of long,mildly painful nails  while ambulating in shoes; unable to trim. Patient states that the glucose reading this morning was not recorded last A1c 6.8 from March 2020. Patient denies any new changes in medication or new problems. Patient denies any new cramping, numbness, burning or tingling in the legs but has occasional tingling and reports excessive discomforts of tuft over the callus areas at the first toes bilateral.  No other pedal complaints noted  Review of Systems  All other systems reviewed and are negative.    Patient Active Problem List   Diagnosis Date Noted  . Cigarette nicotine dependence without complication [id number]  . OCD (obsessive compulsive disorder) 04/10/2015  . Schizoaffective disorder, depressive type ([place]) 04/10/2015  . Carpal tunnel syndrome 01/02/2014  . Nausea alone 08/01/2013  . Obesity, Class III, BMI 40-49.9 (morbid obesity) ([place]) 07/14/2013  . Ileus ([place]) 07/11/2013  . 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 DVT (deep venous thrombosis) ([place]) 03/22/2013  . Nonspecific abnormal electrocardiogram (ECG) (EKG) 03/05/2013  . Paresthesia 03/05/2013  . Accelerated hypertension 03/05/2013  . Schizoaffective disorder ([place]) 03/05/2013  . Obsessive compulsive disorder 03/05/2013  . Depression 03/05/2013  . Obstructive sleep apnea 03/05/2013  . Glaucoma 03/05/2013  . 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 amLODipine (NORVASC) 5 MG tablet Take 5 mg by mouth every morning.     . ARIPiprazole (ABILIFY) 30 MG tablet Take 1 tablet (30 mg total) by mouth daily. 90 tablet 1  . aspirin EC 81 MG tablet Take 162 mg by mouth daily.    [name] atorvastatin (LIPITOR) 20 MG tablet Take 1 tablet by mouth  daily.    [name] buPROPion (WELLBUTRIN XL) 300 MG 24 hr tablet TAKE 1 TABLET(300 MG) BY MOUTH EVERY MORNING 90 tablet 1  . Calcium Carb-Cholecalciferol 600-800 MG-UNIT TABS Take 1 tablet by mouth daily.    . DULoxetine (CYMBALTA) 30 MG capsule Take 3 capsules (90 mg total) by mouth daily. 270 capsule 1  . esomeprazole (NEXIUM) 40 MG capsule Take 40 mg by mouth daily before breakfast.    . etanercept (ENBREL SURECLICK) 50 MG/ML injection Inject 50 mg into the skin once a week. Tuesday    . furosemide (LASIX) 40 MG tablet Take 40 mg by mouth.    [name] glucosamine-chondroitin 500-400 MG tablet Take 2 tablets by mouth every morning.    . insulin glargine (LANTUS) 100 UNIT/ML injection Inject 10 Units into the skin daily.    Javier Docker Oil 300 MG CAPS Take 300 mg by mouth daily.    [name] levobunolol (BETAGAN) 0.5 % ophthalmic solution Place 1 drop into both eyes every morning.     . lubiprostone (AMITIZA) 24 MCG capsule Take 24 mcg by mouth every Monday, Wednesday, and Friday.     . metFORMIN (GLUCOPHAGE) 1000 MG tablet Take 1,000 mg by mouth 2 (two) times daily with a meal.    . Multiple Vitamin (MULTIVITAMIN WITH MINERALS) TABS tablet Take 1 tablet by mouth daily.    [name] NIFEdipine (PROCARDIA-XL/ADALAT CC) 60 MG 24 hr tablet Take 1 tablet (60 mg total) by mouth daily. 30 tablet 1  . oxyCODONE-acetaminophen (PERCOCET/ROXICET) 5-325 MG tablet Take 1-2 tablets by mouth every 4 (four) hours as needed for severe pain.    [name]  PRESCRIPTION MEDICATION Inhale 1 application into the lungs at bedtime. BiPAP setting 8/12    . QUEtiapine (SEROQUEL) 300 MG tablet Take 2 tablets (600 mg total) by mouth at bedtime. 180 tablet 1  . Turmeric POWD Take 450 mcg by mouth daily at 12 noon.    . verapamil (CALAN-SR) 180 MG CR tablet Take 1 tablet (180 mg total) by mouth daily. 30 tablet 1   No current facility-administered medications on file prior to visit.    Allergies  Allergen Reactions  . Ace Inhibitors Anaphylaxis  . Arava  [Leflunomide] Other (See Comments)    Nephritis and kidney clearance issues  . Travatan [Travoprost] Other (See Comments)    Red eyes, irritation     No results found for this or any previous visit (from the past 2160 hour(s)).  Objective: General: Patient is awake, alert, and oriented x 3 and in no acute distress.  Integument: Skin is warm, dry and supple bilateral. Nails are tender, long, thickened and  dystrophic with subungual debris, consistent with onychomycosis, 1-5 bilateral. No signs of infection. + Callus lesions present bilateral 1st toes. Remaining integument unremarkable.  Vasculature:  Dorsalis Pedis pulse 1/4 bilateral. Posterior Tibial pulse 0/4 bilateral.  Capillary fill time <3 sec 1-5 bilateral. Positive hair growth to the level of the digits. Temperature gradient within normal limits. No varicosities present bilateral. No edema present bilateral.   Neurology: The patient has intact sensation measured with a 5.07/10g Semmes Weinstein Monofilament at all pedal sites bilateral. Vibratory sensation diminished bilateral with tuning fork. No Babinski sign present bilateral.   Musculoskeletal: Asymptomatic pes planus and hammertoe pedal deformities noted bilateral. Muscular strength 5/5 in all lower extremity muscular groups bilateral without pain on range of motion. No tenderness with calf compression bilateral.  Assessment and Plan: Problem List Items Addressed This Visit    None    Visit Diagnoses    Pain due to onychomycosis of toenails of both feet    -  Primary   Diabetic mononeuropathy associated with type 2 diabetes mellitus ([facility]CC)       Pes planus of both feet       Hammer toes of both feet       PVD (peripheral vascular disease) ([facility])       Skin callus         -Examined patient. -Discussed and educated patient on diabetic foot care, especially with  regards to the vascular, neurological and musculoskeletal systems.  -Stressed the importance of good  glycemic control and the detriment of not  controlling glucose levels in relation to the foot. -Mechanically debrided all nails 1-5 bilateral using sterile nail nipper and filed with dremel without incident  -Office to call patient to arrange diabetic shoe fitting  -Answered all patient questions -Patient to return  in 3 months for at risk foot care -Patient advised to call the office if any problems or questions arise in the meantime.  Landis Martins, DPM

## 2019-01-26 ENCOUNTER — Ambulatory Visit: Payer: Medicare Other | Admitting: Psychology

## 2019-01-29 ENCOUNTER — Other Ambulatory Visit: Payer: Self-pay | Admitting: Internal Medicine

## 2019-01-29 ENCOUNTER — Encounter: Payer: Self-pay | Admitting: Gastroenterology

## 2019-01-29 DIAGNOSIS — Z1231 Encounter for screening mammogram for malignant neoplasm of breast: Secondary | ICD-10-CM

## 2019-02-08 ENCOUNTER — Ambulatory Visit (INDEPENDENT_AMBULATORY_CARE_PROVIDER_SITE_OTHER): Payer: Medicare Other | Admitting: Psychology

## 2019-02-08 DIAGNOSIS — F251 Schizoaffective disorder, depressive type: Secondary | ICD-10-CM | POA: Diagnosis not present

## 2019-02-26 ENCOUNTER — Other Ambulatory Visit: Payer: Self-pay

## 2019-02-26 ENCOUNTER — Ambulatory Visit (INDEPENDENT_AMBULATORY_CARE_PROVIDER_SITE_OTHER): Payer: Medicare Other | Admitting: Family Medicine

## 2019-02-26 ENCOUNTER — Encounter (INDEPENDENT_AMBULATORY_CARE_PROVIDER_SITE_OTHER): Payer: Self-pay | Admitting: Family Medicine

## 2019-02-26 ENCOUNTER — Encounter: Payer: Self-pay | Admitting: Family Medicine

## 2019-02-26 VITALS — BP 127/77 | HR 76 | Temp 98.4°F | Ht 69.0 in | Wt 281.0 lb

## 2019-02-26 DIAGNOSIS — E119 Type 2 diabetes mellitus without complications: Secondary | ICD-10-CM

## 2019-02-26 DIAGNOSIS — K5909 Other constipation: Secondary | ICD-10-CM | POA: Insufficient documentation

## 2019-02-26 DIAGNOSIS — R0600 Dyspnea, unspecified: Secondary | ICD-10-CM | POA: Diagnosis not present

## 2019-02-26 DIAGNOSIS — R0609 Other forms of dyspnea: Secondary | ICD-10-CM

## 2019-02-26 DIAGNOSIS — Z9189 Other specified personal risk factors, not elsewhere classified: Secondary | ICD-10-CM

## 2019-02-26 DIAGNOSIS — E1159 Type 2 diabetes mellitus with other circulatory complications: Secondary | ICD-10-CM

## 2019-02-26 DIAGNOSIS — Z1331 Encounter for screening for depression: Secondary | ICD-10-CM

## 2019-02-26 DIAGNOSIS — Z794 Long term (current) use of insulin: Secondary | ICD-10-CM

## 2019-02-26 DIAGNOSIS — Z6841 Body Mass Index (BMI) 40.0 and over, adult: Secondary | ICD-10-CM

## 2019-02-26 DIAGNOSIS — Z0289 Encounter for other administrative examinations: Secondary | ICD-10-CM

## 2019-02-26 DIAGNOSIS — R5383 Other fatigue: Secondary | ICD-10-CM | POA: Diagnosis not present

## 2019-02-26 DIAGNOSIS — G4733 Obstructive sleep apnea (adult) (pediatric): Secondary | ICD-10-CM | POA: Diagnosis not present

## 2019-02-26 DIAGNOSIS — I10 Essential (primary) hypertension: Secondary | ICD-10-CM

## 2019-02-26 DIAGNOSIS — I152 Hypertension secondary to endocrine disorders: Secondary | ICD-10-CM | POA: Insufficient documentation

## 2019-02-26 DIAGNOSIS — F17210 Nicotine dependence, cigarettes, uncomplicated: Secondary | ICD-10-CM

## 2019-02-26 HISTORY — DX: Type 2 diabetes mellitus without complications: E11.9

## 2019-02-26 HISTORY — DX: Type 2 diabetes mellitus without complications: Z79.4

## 2019-02-26 NOTE — Progress Notes (Signed)
.  Office: [phone]  /  Fax: [phone]   HPI:   Chief Complaint: OBESITY  Jeanne [name] (MR# [id number]) is a 58 y.o. female who presents on 02/26/2019 for obesity evaluation and treatment. Current BMI is Body mass index is 41.5 kg/m.Jeanne [name] has struggled with obesity for years and has been unsuccessful in either losing weight or maintaining long term weight loss. Jeanne [name] attended our information session and states she is currently in the action stage of change and ready to dedicate time achieving and maintaining a healthier weight.   Jeanne [name] has a BMI of 41.6. She wants to lose 100 lbs for better health. She previously lost greater than 100 lbs through calorie counting, WW, and IF and regained all of it.  Jeanne [name] states her desired weight loss is 105 lbs she has been heavy most of  her life she started gaining weight in her late 20's her heaviest weight ever was 296 lbs. she has significant food cravings issues  she snacks frequently in the evenings she skips breakfast twice a week she frequently eats larger portions than normal  she has binge eating behaviors she struggles with emotional eating    Fatigue Jeanne [name] feels her energy is lower than it should be. This has worsened with weight gain and has worsened recently. Jeanne [name] denies daytime somnolence and denies waking up still tired. Patient generally gets 6-7 hours of sleep per night, and states they generally have restful sleep. Snoring is present. Apneic episodes are not present since wearing BiPAP. Epworth Sleepiness Score is 8. Jeanne [name] has obstructive sleep apnea and is compliant with her BiPAP.  Dyspnea on exertion Jeanne [name] notes increasing shortness of breath with exercising and seems to be worsening over time with weight gain. She notes getting out of breath sooner with activity than she used to. This has gotten worse recently. Jeanne [name] smokes 1/2 to 1 pack per day x35 years. She will work on smoking  cessation. Jeanne [name] denies orthopnea.  Diabetes Mellitus (New) Jeanne [name] has a new diagnosis of diabetes mellitus, currently insulin dependent and managed by her PCP, and she is on Lantus 12 units, Jardiance, and metformin. Jeanne [name] does not report checking her blood sugars. She has been working on intensive lifestyle modifications including diet, exercise, and weight loss to help control her blood glucose levels.  Obstructive Sleep Apnea [name] has a diagnosis of obstructive sleep apnea and reports being compliant with her BiPAP.  Depression with emotional eating behaviors Jeanne [name] is struggling with emotional eating and using food for comfort to the extent that it is negatively impacting her health. She often snacks when she is not hungry. Jeanne [name] sometimes feels she is out of control and then feels guilty that she made poor food choices. She has been working on behavior modification techniques to help reduce her emotional eating and has been somewhat successful. [name] also has schizoaffective disorder, depressed type, and is followed by Psychiatry. She is stable on Seroquel QHS, Abilify daily, Cymbalta daily, and Wellbutrin daily. She shows no sign of suicidal or homicidal ideations.  Depression Screen Jeanne [name]'s Food and Mood (modified PHQ-9) score was 13. Depression screen PHQ 2/9 02/26/2019  Decreased Interest 3  Down, Depressed, Hopeless 3  PHQ - 2 Score 6  Altered sleeping 0  Tired, decreased energy 3  Change in appetite 2  Feeling bad or failure about yourself  2  Trouble concentrating 0  Moving slowly or fidgety/restless 0  Suicidal thoughts 0  PHQ-9 Score 13  Difficult doing work/chores Somewhat difficult  Rheumatoid Arthritis Jeanne [name] has a diagnosis of Rheumatoid Arthritis and is followed by Dr. Trudie Reed of Rheumatology. She has known severe osteoarthritis of her left hip and bilateral knees. She is a candidate for surgery, but is holding on that for now. She has  chronic opioid use of 1 Percocet daily and is on Enbrel for rheumatoid arthritis.  ASSESSMENT AND PLAN:  Other fatigue - Plan: EKG 12-Lead, TSH, T4, free, T3, CBC w/Diff/Platelet, Vitamin D (25 hydroxy), B12  Dyspnea on exertion - Plan: Lipid Panel With LDL/HDL Ratio  Hypertension associated with diabetes ([place])  Obstructive sleep apnea  Cigarette nicotine dependence without complication  Chronic constipation  Diabetes mellitus type 2, insulin dependent ([place]) - Plan: Urine Microalbumin w/creat. ratio, Comprehensive Metabolic Panel (CMET), HgB A1c, Insulin, random  Depression screening  Class 3 severe obesity with serious comorbidity and body mass index (BMI) of 40.0 to 44.9 in adult, unspecified obesity type ([place])  PLAN:  Fatigue Averiana was informed that her fatigue may be related to obesity, depression or many other causes. Labs will be ordered, and in the meanwhile Jeanne [name] has agreed to work on diet, exercise and weight loss to help with fatigue. Proper sleep hygiene was discussed including the need for 7-8 hours of quality sleep each night. A sleep study was not ordered based on symptoms and Epworth score. EKG showed right bundle branch block with a rate of 72.  Dyspnea on exertion Jeanne [name]'s shortness of breath appears to be obesity related and exercise induced. She has agreed to work on weight loss and gradually increase exercise to treat her exercise induced shortness of breath. If Jeanne [name] follows our instructions and loses weight without improvement of her shortness of breath, we will plan to refer to pulmonology. We will monitor this condition regularly. IC today showed 1752 kcal/day. Jeanne [name] agrees to this plan.  Diabetes Mellitus (New)  Jeanne [name] has been given extensive diabetes education by myself today including ideal fasting and post-prandial blood glucose readings, individual ideal HgA1c goals  and hypoglycemia prevention. We discussed the importance of good  blood sugar control to decrease the likelihood of diabetic complications such as nephropathy, neuropathy, limb loss, blindness, coronary artery disease, and death. We discussed the importance of intensive lifestyle modification including diet, exercise and weight loss as the first line treatment for diabetes. Kely will have labs drawn today and will be considered for GLP-1 versus stopping Lantus.  Obstructive Sleep Apnea Doha will continue her BiPAP and follow-up as directed.  Depression with Emotional Eating Behaviors We discussed behavior modification techniques today to help Vickey deal with her emotional eating and depression. Shaniece is interested in seeing Dr. Mallie Mussel, our bariatric psychologist, and will be referred for evaluation.  Depression Screen Jeani had a moderately positive depression screening. Depression is commonly associated with obesity and often results in emotional eating behaviors. We will monitor this closely and work on CBT to help improve the non-hunger eating patterns. Referral to Psychology may be required if no improvement is seen as she continues in our clinic.  Rheumatoid Arthritis Alya will be referred to Physical Therapy.  Cardiovascular risk counselling Dylanie was given extended (15 minutes) coronary artery disease prevention counseling today. She is 58 y.o. female and has risk factors for heart disease including obesity. We discussed intensive lifestyle modifications today with an emphasis on specific weight loss instructions and strategies. Pt was also informed of the importance of increasing exercise and decreasing saturated fats to help prevent heart disease.  Obesity Velina is currently in the  action stage of change and her goal is to continue with weight loss efforts. She has agreed to follow the Category 2 plan. Alizia has been instructed to exercise as tolerated and is referred to PT for weight loss and overall health  benefits. We discussed the following Behavioral Modification Strategies today: increasing lean protein intake, decreasing simple carbohydrates  and increasing vegetables, increase H20 intake, decrease liquid calories, work on meal planning & cooking strategies, keeping healthy foods in the home, ways to avoid boredom eating, and emotional eating strategies.  Allissia has agreed to follow-up with our clinic in 2 weeks. She was informed of the importance of frequent follow-up visits to maximize her success with intensive lifestyle modifications for her multiple health conditions. She was informed we would discuss her lab results at her next visit unless there is a critical issue that needs to be addressed sooner. Vola agreed to keep her next visit at the agreed upon time to discuss these results.  ALLERGIES: Allergies  Allergen Reactions  . Ace Inhibitors Anaphylaxis  . Arava [Leflunomide] Other (See Comments)    Nephritis and kidney clearance issues  . Travatan [Travoprost] Other (See Comments)    Red eyes, irritation     MEDICATIONS: Current Outpatient Medications on File Prior to Visit  Medication Sig Dispense Refill  . amLODipine (NORVASC) 5 MG tablet Take 5 mg by mouth every morning.     . ARIPiprazole (ABILIFY) 30 MG tablet Take 1 tablet (30 mg total) by mouth daily. 90 tablet 1  . aspirin EC 81 MG tablet Take 162 mg by mouth daily.    Jeanne Kitchen atorvastatin (LIPITOR) 20 MG tablet Take 1 tablet by mouth daily.    Jeanne Kitchen buPROPion (WELLBUTRIN XL) 300 MG 24 hr tablet TAKE 1 TABLET(300 MG) BY MOUTH EVERY MORNING 90 tablet 1  . Calcium Carb-Cholecalciferol 600-800 MG-UNIT TABS Take 1 tablet by mouth daily.    . DULoxetine (CYMBALTA) 30 MG capsule Take 3 capsules (90 mg total) by mouth daily. 270 capsule 1  . empagliflozin (JARDIANCE) 10 MG TABS tablet Take 10 mg by mouth daily.    Jeanne Kitchen esomeprazole (NEXIUM) 40 MG capsule Take 40 mg by mouth daily before breakfast.    . etanercept (ENBREL  SURECLICK) 50 MG/ML injection Inject 50 mg into the skin once a week. Tuesday    . furosemide (LASIX) 40 MG tablet Take 40 mg by mouth.    . Glucosamine HCl-MSM (GLUCOSAMINE-MSM) 1500-500 MG/30ML LIQD Take by mouth.    Jeanne Kitchen glucosamine-chondroitin 500-400 MG tablet Take 2 tablets by mouth every morning.    . insulin glargine (LANTUS) 100 UNIT/ML injection Inject 12 Units into the skin daily.     Javier Docker Oil 300 MG CAPS Take 300 mg by mouth daily.    Jeanne Kitchen levobunolol (BETAGAN) 0.5 % ophthalmic solution Place 1 drop into both eyes every morning.     . metFORMIN (GLUCOPHAGE) 1000 MG tablet Take 1,000 mg by mouth 2 (two) times daily with a meal.    . Multiple Vitamin (MULTIVITAMIN WITH MINERALS) TABS tablet Take 1 tablet by mouth daily.    Jeanne Kitchen NIFEdipine (PROCARDIA-XL/ADALAT CC) 60 MG 24 hr tablet Take 1 tablet (60 mg total) by mouth daily. 30 tablet 1  . oxyCODONE-acetaminophen (PERCOCET/ROXICET) 5-325 MG tablet Take 1-2 tablets by mouth every 4 (four) hours as needed for severe pain.    Jeanne Kitchen PRESCRIPTION MEDICATION Inhale 1 application into the lungs at bedtime. BiPAP setting 8/12    . QUEtiapine (SEROQUEL) 300 MG tablet  Take 2 tablets (600 mg total) by mouth at bedtime. 180 tablet 1  . Turmeric POWD Take 450 mcg by mouth daily at 12 noon.    . verapamil (CALAN-SR) 180 MG CR tablet Take 1 tablet (180 mg total) by mouth daily. 30 tablet 1   No current facility-administered medications on file prior to visit.     PAST MEDICAL HISTORY: Past Medical History:  Diagnosis Date  . Antiphospholipid antibody syndrome ([place])   . Antiphospholipid antibody syndrome ([place])   . Anxiety   . Arthritis   . Bilateral swelling of feet   . Carpal tunnel syndrome 01/02/2014   Bilateral  . Clotting disorder ([place])   . 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 Constipation   . Depression   . Diabetes mellitus type 2, insulin dependent ([place]) 02/26/2019  . Diabetes mellitus, type II ([place])   . Difficult intubation 07/09/2013   Glidescope used,  see Anesthesia note  . DVT (deep venous thrombosis) ([place]) 2014   LEFT LEG  . Gastritis   . Glaucoma   . Heart murmur   . History of blood clots   . Hyperlipidemia   . Hypertension   . Hypokalemia 03/05/2013  . Kidney disease   . Neuromuscular disorder ([place])    " NEUROPATHY IN  MY HANDS"  . Obsessive-compulsive disorder   . Osteoarthritis   . Rheumatoid arthritis ([place])   . Schizo-affective psychosis ([place])   . Schizoaffective disorder ([place])   . Scleritis of both eyes   . Sleep apnea    Bipap  . Suppurative hidradenitis   . Undifferentiated connective tissue disease ([place])     PAST SURGICAL HISTORY: Past Surgical History:  Procedure Laterality Date  . BUNIONECTOMY    . CERVICAL CONIZATION W/BX    . LAPAROSCOPIC ADRENALECTOMY Left 07/09/2013   Procedure: LAPAROSCOPIC LEFT  ADRENALECTOMY;  Surgeon: Earnstine Regal, MD;  Location: [facility];  Service: General;  Laterality: Left;  . LYMPH NODE DISSECTION      SOCIAL HISTORY: Social History   Tobacco Use  . Smoking status: Current Every Day Smoker    Packs/day: 0.50    Years: 25.00    Pack years: 12.50    Types: Cigarettes  . Smokeless tobacco: Never Used  Substance Use Topics  . Alcohol use: No    Alcohol/week: 0.0 standard drinks    Comment: " drink just on weekends " Last drink was in the spring of 2016  . Drug use: No    FAMILY HISTORY: Family History  Problem Relation Age of Onset  . Cancer Maternal Aunt        breast ca  . Heart attack Maternal Grandmother 54  . Heart attack Maternal Grandfather 62  . Hypertension Sister   . Hypertension Sister   . Diabetes Mother   . Hypertension Mother   . Hyperlipidemia Mother   . Stroke Mother   . Depression Mother   . Eating disorder Mother   . Hypertension Father   . Seizures Maternal Uncle   . Alcohol abuse Neg Hx   . Anxiety disorder Neg Hx   . Bipolar disorder Neg Hx   . Drug abuse Neg Hx   . Schizophrenia Neg Hx    ROS: Review of Systems  Constitutional:  Positive for malaise/fatigue.  HENT:       Positive for mouth sores.  Eyes:       Positive for wearing glasses or contacts. Positive for floaters.  Respiratory: Positive for shortness of breath (  with activity).        Positive for obstructive sleep apnea, using CPAP.  Cardiovascular: Negative for orthopnea.  Gastrointestinal: Positive for constipation.       Positive for rectal bleeding.  Musculoskeletal: Positive for joint pain.       Positive for rheumatoid arthritis. Positive for red or swollen joints.  Psychiatric/Behavioral: Positive for depression (emotional eating). Negative for suicidal ideas.       Negative for homicidal ideas.   PHYSICAL EXAM: Blood pressure 127/77, pulse 76, temperature 98.4 F (36.9 C), temperature source Oral, height 5\' 9"  (1.753 m), weight 281 lb (127.5 kg), SpO2 97 %. Body mass index is 41.5 kg/m. Physical Exam Vitals signs reviewed.  Constitutional:      Appearance: Normal appearance. She is well-developed. She is obese.  HENT:     Head: Normocephalic and atraumatic.     Nose: Nose normal.  Eyes:     General: No scleral icterus. Neck:     Musculoskeletal: Normal range of motion.  Cardiovascular:     Rate and Rhythm: Normal rate and regular rhythm.  Pulmonary:     Effort: Pulmonary effort is normal. No respiratory distress.  Abdominal:     Palpations: Abdomen is soft.     Tenderness: There is no abdominal tenderness.  Musculoskeletal: Normal range of motion.     Comments: Range of motion normal in all four extremities.  Skin:    General: Skin is warm and dry.  Neurological:     Mental Status: She is alert and oriented to person, place, and time.     Coordination: Coordination normal.     Comments: Uses cane for ambulation.  Psychiatric:        Mood and Affect: Mood and affect normal.        Behavior: Behavior normal.   RECENT LABS AND TESTS: BMET    Component Value Date/Time   NA 138 02/01/2018 0800   K 4.0 02/01/2018 0800   CL  105 02/01/2018 0800   CO2 25 02/01/2018 0800   GLUCOSE 151 (H) 02/01/2018 0800   BUN 15 02/01/2018 0800   CREATININE 1.18 (H) 02/01/2018 0800   CALCIUM 9.4 02/01/2018 0800   GFRNONAA 50 (L) 02/01/2018 0800   GFRAA 58 (L) 02/01/2018 0800   No results found for: HGBA1C No results found for: INSULIN CBC    Component Value Date/Time   WBC 3.7 (L) 02/01/2018 0800   RBC 4.12 02/01/2018 0800   HGB 13.7 02/01/2018 0800   HCT 40.3 02/01/2018 0800   PLT 158 02/01/2018 0800   MCV 97.8 02/01/2018 0800   MCH 33.3 02/01/2018 0800   MCHC 34.0 02/01/2018 0800   RDW 12.5 02/01/2018 0800   LYMPHSABS 0.4 (L) 10/22/2014 1454   MONOABS 0.7 10/22/2014 1454   EOSABS 0.1 10/22/2014 1454   BASOSABS 0.0 10/22/2014 1454   Iron/TIBC/Ferritin/ %Sat No results found for: IRON, TIBC, FERRITIN, IRONPCTSAT Lipid Panel  No results found for: CHOL, TRIG, HDL, CHOLHDL, VLDL, LDLCALC, LDLDIRECT Hepatic Function Panel     Component Value Date/Time   PROT 7.8 08/04/2015 1814   ALBUMIN 4.0 08/04/2015 1814   AST 37 08/04/2015 1814   ALT 77 (H) 08/04/2015 1814   ALKPHOS 130 (H) 08/04/2015 1814   BILITOT 0.7 08/04/2015 1814      Component Value Date/Time   TSH 1.639 03/05/2013 1500   No results found for: Vitamin D, 25-Hydroxy  ECG  shows sinus rhythm with a rate of 72 BPM. Right bundle branch  block. Abnormal.   INDIRECT CALORIMETER done today shows a VO2 of 252 and a REE of 1752. Her calculated basal metabolic rate is 0000000 thus her basal metabolic rate is worse than expected.  OBESITY BEHAVIORAL INTERVENTION VISIT  Today's visit was #1  Starting weight: 281 lbs Starting date: 02/26/2019 Today's weight: 281 lbs  Today's date: 02/26/2019 Total lbs lost to date: 0 At least 15 minutes were spent on discussing the following behavioral intervention visit.    02/26/2019  Height 5\' 9"  (1.753 m)  Weight 281 lb (127.5 kg)  BMI (Calculated) 41.48  BLOOD PRESSURE - SYSTOLIC [id number]  BLOOD PRESSURE -  DIASTOLIC 77  Waist Measurement  52 inches   Body Fat % 47.8 %  Total Body Water (lbs) 96.2 lbs  RMR 1752   ASK: We discussed the diagnosis of obesity with Asley [name] today and Syona agreed to give [name] permission to discuss obesity behavioral modification therapy today.  ASSESS: Jewelissa has the diagnosis of obesity and her BMI today is 41.6. Dacie is in the action stage of change.   ADVISE: Alyssamae was educated on the multiple health risks of obesity as well as the benefit of weight loss to improve her health. She was advised of the need for long term treatment and the importance of lifestyle modifications to improve her current health and to decrease her risk of future health problems.  AGREE: Multiple dietary modification options and treatment options were discussed and  Dalylah agreed to follow the recommendations documented in the above note.  ARRANGE: Berta was educated on the importance of frequent visits to treat obesity as outlined per CMS and USPSTF guidelines and agreed to schedule her next follow up appointment today.  IMichaelene Song, am acting as Location manager for [employer]. Juleen China, DO  I have reviewed the above documentation for accuracy and completeness, and I agree with the above. [name], DO

## 2019-02-27 ENCOUNTER — Encounter: Payer: Self-pay | Admitting: Gastroenterology

## 2019-02-27 ENCOUNTER — Ambulatory Visit: Payer: Medicare Other | Admitting: Gastroenterology

## 2019-02-27 VITALS — BP 118/76 | HR 78 | Temp 98.5°F | Ht 69.0 in | Wt 286.0 lb

## 2019-02-27 DIAGNOSIS — K625 Hemorrhage of anus and rectum: Secondary | ICD-10-CM

## 2019-02-27 DIAGNOSIS — K5909 Other constipation: Secondary | ICD-10-CM

## 2019-02-27 DIAGNOSIS — Z1159 Encounter for screening for other viral diseases: Secondary | ICD-10-CM | POA: Diagnosis not present

## 2019-02-27 LAB — COMPREHENSIVE METABOLIC PANEL
ALT: 29 IU/L (ref 0–32)
AST: 20 IU/L (ref 0–40)
Albumin/Globulin Ratio: 1.7 (ref 1.2–2.2)
Albumin: 4.7 g/dL (ref 3.8–4.9)
Alkaline Phosphatase: 118 IU/L — ABNORMAL HIGH (ref 39–117)
BUN/Creatinine Ratio: 16 (ref 9–23)
BUN: 20 mg/dL (ref 6–24)
Bilirubin Total: 0.4 mg/dL (ref 0.0–1.2)
CO2: 27 mmol/L (ref 20–29)
Calcium: 9.7 mg/dL (ref 8.7–10.2)
Chloride: 99 mmol/L (ref 96–106)
Creatinine, Ser: 1.27 mg/dL — ABNORMAL HIGH (ref 0.57–1.00)
GFR calc Af Amer: 54 mL/min/{1.73_m2} — ABNORMAL LOW (ref >59)
GFR calc non Af Amer: 47 mL/min/{1.73_m2} — ABNORMAL LOW (ref >59)
Globulin, Total: 2.8 g/dL (ref 1.5–4.5)
Glucose: 103 mg/dL — ABNORMAL HIGH (ref 65–99)
Potassium: 4.3 mmol/L (ref 3.5–5.2)
Sodium: 138 mmol/L (ref 134–144)
Total Protein: 7.5 g/dL (ref 6.0–8.5)

## 2019-02-27 LAB — CBC WITH DIFFERENTIAL/PLATELET
Basophils Absolute: 0 10*3/uL (ref 0.0–0.2)
Basos: 0 %
EOS (ABSOLUTE): 0.2 10*3/uL (ref 0.0–0.4)
Eos: 4 %
Hematocrit: 44.1 % (ref 34.0–46.6)
Hemoglobin: 15.5 g/dL (ref 11.1–15.9)
Immature Grans (Abs): 0 10*3/uL (ref 0.0–0.1)
Immature Granulocytes: 0 %
Lymphocytes Absolute: 2.3 10*3/uL (ref 0.7–3.1)
Lymphs: 45 %
MCH: 32.7 pg (ref 26.6–33.0)
MCHC: 35.1 g/dL (ref 31.5–35.7)
MCV: 93 fL (ref 79–97)
Monocytes Absolute: 0.5 10*3/uL (ref 0.1–0.9)
Monocytes: 9 %
Neutrophils Absolute: 2.1 10*3/uL (ref 1.4–7.0)
Neutrophils: 42 %
Platelets: 206 10*3/uL (ref 150–450)
RBC: 4.74 x10E6/uL (ref 3.77–5.28)
RDW: 13.1 % (ref 11.7–15.4)
WBC: 5.1 10*3/uL (ref 3.4–10.8)

## 2019-02-27 LAB — LIPID PANEL WITH LDL/HDL RATIO
Cholesterol, Total: 173 mg/dL (ref 100–199)
HDL: 69 mg/dL (ref >39)
LDL Chol Calc (NIH): 81 mg/dL (ref 0–99)
LDL/HDL Ratio: 1.2 ratio (ref 0.0–3.2)
Triglycerides: 132 mg/dL (ref 0–149)
VLDL Cholesterol Cal: 23 mg/dL (ref 5–40)

## 2019-02-27 LAB — MICROALBUMIN / CREATININE URINE RATIO
Creatinine, Urine: 34.6 mg/dL
Microalb/Creat Ratio: 9 mg/g creat (ref 0–29)
Microalbumin, Urine: 3.1 ug/mL

## 2019-02-27 LAB — VITAMIN B12: Vitamin B-12: 1216 pg/mL (ref 232–1245)

## 2019-02-27 LAB — T3: T3, Total: 78 ng/dL (ref 71–180)

## 2019-02-27 LAB — HEMOGLOBIN A1C
Est. average glucose Bld gHb Est-mCnc: 134 mg/dL
Hgb A1c MFr Bld: 6.3 % — ABNORMAL HIGH (ref 4.8–5.6)

## 2019-02-27 LAB — VITAMIN D 25 HYDROXY (VIT D DEFICIENCY, FRACTURES): Vit D, 25-Hydroxy: 24.2 ng/mL — ABNORMAL LOW (ref 30.0–100.0)

## 2019-02-27 LAB — TSH: TSH: 0.845 u[IU]/mL (ref 0.450–4.500)

## 2019-02-27 LAB — T4, FREE: Free T4: 1.05 ng/dL (ref 0.82–1.77)

## 2019-02-27 LAB — INSULIN, RANDOM: INSULIN: 21 u[IU]/mL (ref 2.6–24.9)

## 2019-02-27 MED ORDER — PEG-KCL-NACL-NASULF-NA ASC-C 100 G PO SOLR: 1.0000 | Freq: Once | ORAL | 0 refills | Status: AC

## 2019-02-27 NOTE — Progress Notes (Signed)
[facility] Consult Note:  History: Jeanne [name] 02/27/2019  Referring provider: Velna Hatchet, MD  Reason for consult/chief complaint: Blood In Stools (for 2 weeks, always BR, on the tissue now but had been in the toilet as well), Constipation (chronic - has tired amitiza - but now on metformin which has helped), and Colonoscopy (2013)   Subjective  HPI:  This is a very pleasant 58 year old woman referred by primary care for recent worsening constipation associated with rectal bleeding.  She has had many years of constipation, prior work-up by GI in [place], where she had her last colonoscopy in 2013.  Recalls being on Amitiza which may have helped some, but now she feels that Metformin treatment for diabetes seems to relieve the constipation somewhat.  BMs are infrequent, stool is hard, she may need to strain, and this may have been what recently led to some bleeding.  It occurred pretty regularly for about 2 weeks and then now she just has some scant blood on the paper.  Primary care recommended MiraLAX but it only made the stool loose at a dose of 17 g daily.  Colonoscopy and pathology reports in 2013 are on file.  She had 2  adenomatous polyps and multiple hyperplastic polyps removed.  Internal hemorrhoids were also reported.  ROS:  Review of Systems  Constitutional: Negative for appetite change and unexpected weight change.  HENT: Negative for mouth sores and voice change.   Eyes: Negative for pain and redness.  Respiratory: Negative for cough and shortness of breath.   Cardiovascular: Negative for chest pain and palpitations.  Genitourinary: Negative for dysuria and hematuria.  Musculoskeletal: Positive for arthralgias. Negative for myalgias.  Skin: Negative for pallor and rash.  Neurological: Negative for weakness and headaches.  Hematological: Negative for adenopathy.  Psychiatric/Behavioral:       Depression     Past Medical History:  Past Medical History:  Diagnosis Date  . Antiphospholipid antibody syndrome ([place])   . Antiphospholipid antibody syndrome ([place])   . Anxiety   . Arthritis   . Bilateral swelling of feet   . Carpal tunnel syndrome 01/02/2014   Bilateral  . Clotting disorder ([place])   . 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 Constipation   . Depression   . 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 Diabetes mellitus, type II ([place])   . Difficult intubation 07/09/2013   Glidescope used, see Anesthesia note  . DVT (deep venous thrombosis) ([place]) 2014   LEFT LEG  . Gastritis   . Glaucoma   . Heart murmur   . History of blood clots   . Hyperlipidemia   . Hypertension   . Hypokalemia 03/05/2013  . Kidney disease    stage ll  . Neuromuscular disorder ([place])    " NEUROPATHY IN  MY HANDS"  . Obsessive-compulsive disorder   . Osteoarthritis   . Rheumatoid arthritis ([place])   . Schizo-affective psychosis ([place])   . Schizoaffective disorder ([place])   . Scleritis of both eyes   . Sleep apnea    Bipap  . Suppurative hidradenitis   . Undifferentiated connective tissue disease ([place])      Past Surgical History: Past Surgical History:  Procedure Laterality Date  . BUNIONECTOMY    . CERVICAL CONIZATION W/BX    . LAPAROSCOPIC ADRENALECTOMY Left 07/09/2013   Procedure: LAPAROSCOPIC LEFT  ADRENALECTOMY;  Surgeon: Earnstine Regal, MD;  Location: [facility];  Service: General;  Laterality: Left;  . sweat gland  removal  1975     Family History: Family History  Problem Relation Age of Onset  . Cancer Maternal Aunt        breast ca  . Heart attack Maternal Grandmother 54  . Heart attack Maternal Grandfather 62  . Hypertension Sister   . Hypertension Sister   . Diabetes Mother   . Hypertension Mother   . Hyperlipidemia Mother   . Stroke Mother   . Depression Mother   . Eating disorder Mother   . Hypertension Father   . Seizures Maternal Uncle   . Alcohol abuse Neg Hx   . Anxiety disorder Neg Hx   .  Bipolar disorder Neg Hx   . Drug abuse Neg Hx   . Schizophrenia Neg Hx   . Colon cancer Neg Hx     Social History: Social History   Socioeconomic History  . Marital status: Single    Spouse name: Not on file  . Number of children: 0  . Years of education: college  . Highest education level: Not on file  Occupational History  . Occupation: Retired  Scientific laboratory technician  . Financial resource strain: Not on file  . Food insecurity    Worry: Not on file    Inability: Not on file  . Transportation needs    Medical: Not on file    Non-medical: Not on file  Tobacco Use  . Smoking status: Current Every Day Smoker    Packs/day: 0.50    Years: 25.00    Pack years: 12.50    Types: Cigarettes  . Smokeless tobacco: Never Used  Substance and Sexual Activity  . Alcohol use: Yes    Alcohol/week: 0.0 standard drinks    Comment: " drink just on weekends " Last drink was in the spring of 2016  . Drug use: No  . Sexual activity: Not Currently  Lifestyle  . Physical activity    Days per week: Not on file    Minutes per session: Not on file  . Stress: Not on file  Relationships  . Social Herbalist on phone: Not on file    Gets together: Not on file    Attends religious service: Not on file    Active member of club or organization: Not on file    Attends meetings of clubs or organizations: Not on file    Relationship status: Not on file  Other Topics Concern  . Not on file  Social History Narrative   Pt is living in [place] with mother. Born and raised in [place] by parents. Childhood was fine. Pt has 3 older sisters. Pt has a law degree. 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 Single, no kids.     Allergies: Allergies  Allergen Reactions  . Ace Inhibitors Anaphylaxis  . Arava [Leflunomide] Other (See Comments)    Nephritis and kidney clearance issues  . Travatan [Travoprost] Other (See Comments)    Red eyes,  irritation     Outpatient Meds: Current Outpatient Medications  Medication Sig Dispense Refill  . amLODipine (NORVASC) 5 MG tablet Take 5 mg by mouth every morning.     . ARIPiprazole (ABILIFY) 30 MG tablet Take 1 tablet (30 mg total) by mouth daily. 90 tablet 1  . aspirin EC 81 MG tablet Take 162 mg by mouth daily.    [name] atorvastatin (LIPITOR) 20 MG tablet Take 1 tablet by mouth daily.    [name]  buPROPion (WELLBUTRIN XL) 300 MG 24 hr tablet TAKE 1 TABLET(300 MG) BY MOUTH EVERY MORNING 90 tablet 1  . Calcium Carb-Cholecalciferol 600-800 MG-UNIT TABS Take 1 tablet by mouth daily.    . DULoxetine (CYMBALTA) 30 MG capsule Take 3 capsules (90 mg total) by mouth daily. 270 capsule 1  . empagliflozin (JARDIANCE) 10 MG TABS tablet Take 10 mg by mouth daily.    [name] esomeprazole (NEXIUM) 40 MG capsule Take 40 mg by mouth daily before breakfast.    . etanercept (ENBREL SURECLICK) 50 MG/ML injection Inject 50 mg into the skin once a week. Tuesday    . furosemide (LASIX) 40 MG tablet Take 40 mg by mouth.    [name] glucosamine-chondroitin 500-400 MG tablet Take 2 tablets by mouth every morning.    . insulin glargine (LANTUS) 100 UNIT/ML injection Inject 12 Units into the skin daily.     Javier Docker Oil 300 MG CAPS Take 300 mg by mouth daily.    [name] levobunolol (BETAGAN) 0.5 % ophthalmic solution Place 1 drop into both eyes every morning.     . metFORMIN (GLUCOPHAGE) 1000 MG tablet Take 1,000 mg by mouth daily with breakfast.     . Multiple Vitamin (MULTIVITAMIN WITH MINERALS) TABS tablet Take 1 tablet by mouth daily.    [name] NIFEdipine (PROCARDIA-XL/ADALAT CC) 60 MG 24 hr tablet Take 1 tablet (60 mg total) by mouth daily. 30 tablet 1  . oxyCODONE-acetaminophen (PERCOCET/ROXICET) 5-325 MG tablet Take 1-2 tablets by mouth every 4 (four) hours as needed for severe pain.    [name] PRESCRIPTION MEDICATION Inhale 1 application into the lungs at bedtime. BiPAP setting 8/12    . QUEtiapine (SEROQUEL) 300 MG tablet Take 2 tablets (600 mg  total) by mouth at bedtime. 180 tablet 1  . Turmeric POWD Take 1,500 mcg by mouth daily at 12 noon.     . verapamil (CALAN-SR) 180 MG CR tablet Take 1 tablet (180 mg total) by mouth daily. 30 tablet 1  . peg 3350 powder (MOVIPREP) 100 g SOLR Take 1 kit (200 g total) by mouth once for 1 dose. 1 kit 0   No current facility-administered medications for this visit.       ___________________________________________________________________ Objective   Exam:  BP 118/76   Pulse 78   Temp 98.5 F (36.9 C)   Ht '5\' 9"'$  (1.753 m)   Wt 286 lb (129.7 kg)   BMI 42.23 kg/m    General: Somewhat restricted affect, not acutely ill-appearing  Eyes: sclera anicteric, no redness  ENT: oral mucosa moist without lesions, no cervical or supraclavicular lymphadenopathy  CV: RRR without murmur, S1/S2, no JVD, mild peripheral edema -venous stasis changes  Resp: clear to auscultation bilaterally, normal RR and effort noted  GI: soft, no tenderness, with active bowel sounds. No guarding or palpable organomegaly noted, though limited by body habitus  Skin; warm and dry, no rash or jaundice noted  Neuro: awake, alert and oriented x 3. Normal gross motor function and fluent speech  Labs:  PCP note indicates recent hemoglobin 15.7.  Assessment: Encounter Diagnoses  Name Primary?  . Chronic constipation Yes  . Rectal bleeding   . Screening for viral disease     Years of chronic constipation, possibly related to side effect of 1 or more of her medicines,, antidepressants, calcium channel blocker, opioid. Recent bleeding most likely benign anorectal bleeding related constipation, but must consider neoplasia.  No chronic diarrhea to suggest IBD.  Plan:  Recommend resuming MiraLAX at  lower dose such as half a capful a day and adjusting as needed. Colonoscopy to investigate rectal bleeding.  She is agreeable after discussion of procedure and risks.  The benefits and risks of the planned procedure  were described in detail with the patient or (when appropriate) their health care proxy.  Risks were outlined as including, but not limited to, bleeding, infection, perforation, adverse medication reaction leading to cardiac or pulmonary decompensation, pancreatitis (if ERCP).  The limitation of incomplete mucosal visualization was also discussed.  No guarantees or warranties were given.  Patient at increased risk for cardiopulmonary complications of procedure due to medical comorbidities.(Sleep apnea, morbid obesity)   Thank you for the courtesy of this consult.  Please call me with any questions or concerns.  Nelida Meuse III  CC: Referring provider noted above
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
## 2019-02-27 NOTE — Patient Instructions (Addendum)
If you are age 58 or older, your body mass index should be between 23-30. Your Body mass index is 42.23 kg/m. If this is out of the aforementioned range listed, please consider follow up with your Primary Care Provider.  If you are age 53 or younger, your body mass index should be between 19-25. Your Body mass index is 42.23 kg/m. If this is out of the aformentioned range listed, please consider follow up with your Primary Care Provider.   You have been scheduled for a colonoscopy. Please follow written instructions given to you at your visit today.  Please pick up your prep supplies at the pharmacy within the next 1-3 days. If you use inhalers (even only as needed), please bring them with you on the day of your procedure. Your physician has requested that you go to www.startemmi.com and enter the access code given to you at your visit today. This web site gives a general overview about your procedure. However, you should still follow specific instructions given to you by our office regarding your preparation for the procedure.  Due to recent COVID-19 restrictions implemented by [employer] and state authorities and in an effort to keep both patients and staff as safe as possible, [facility] requires COVID-19 testing prior to any scheduled endoscopic procedure. The testing center is located at [address] [place], [place] [address] in the [facility] [employer]  suite.  Your appointment has been scheduled for 03-09-2019 at 8:50am.   Please bring your insurance cards to this appointment. You will require your COVID screen 2 business days prior to your endoscopic procedure.  You are not required to quarantine after your screening.  You will only receive a phone call with the results if it is POSITIVE.  If you do not receive a call the day before your procedure you should begin your prep, if ordered, and you should report to the endo center for  your procedure at your designated appointment arrival time ( one hour prior to the procedure time). There is no cost to you for the screening on the day of the swab.  [facility] will file with your insurance company for the testing.     Try Miralax 1/2 capful a day.   It was a pleasure to see you today!  Dr. Loletha Carrow

## 2019-03-02 ENCOUNTER — Encounter: Payer: Self-pay | Admitting: Gastroenterology

## 2019-03-05 ENCOUNTER — Telehealth: Payer: Self-pay | Admitting: Gastroenterology

## 2019-03-05 NOTE — Telephone Encounter (Signed)
You had requested this patient use Moviprep. Its not covered with her insurance and we don't have any samples of Moviprep. Please advise on alternative. Thank you.

## 2019-03-05 NOTE — Telephone Encounter (Signed)
Patient was given moviprep but her insurance does not cover it. She said that they told her these are three that are covered- suprep, clenpiq, or gavilyte- c. States she does not know if we use those and that there could be others her insurance will cover but those are the ones her pharmacy told her.

## 2019-03-06 ENCOUNTER — Other Ambulatory Visit: Payer: Self-pay

## 2019-03-06 ENCOUNTER — Ambulatory Visit (INDEPENDENT_AMBULATORY_CARE_PROVIDER_SITE_OTHER): Payer: Medicare Other | Admitting: Psychology

## 2019-03-06 DIAGNOSIS — F251 Schizoaffective disorder, depressive type: Secondary | ICD-10-CM

## 2019-03-06 NOTE — Telephone Encounter (Signed)
Gavilyte.  It is a PEG prep like golytely

## 2019-03-07 MED ORDER — PEG 3350-KCL-NA BICARB-NACL 420 G PO SOLR: 4000.0000 mL | Freq: Once | ORAL | 0 refills | Status: AC

## 2019-03-07 NOTE — Telephone Encounter (Signed)
Pt has been notified and aware.  

## 2019-03-08 ENCOUNTER — Ambulatory Visit (INDEPENDENT_AMBULATORY_CARE_PROVIDER_SITE_OTHER): Payer: Medicare Other | Admitting: Psychology

## 2019-03-08 DIAGNOSIS — F251 Schizoaffective disorder, depressive type: Secondary | ICD-10-CM

## 2019-03-09 ENCOUNTER — Ambulatory Visit (INDEPENDENT_AMBULATORY_CARE_PROVIDER_SITE_OTHER): Payer: Medicare Other

## 2019-03-09 ENCOUNTER — Other Ambulatory Visit: Payer: Self-pay | Admitting: Gastroenterology

## 2019-03-09 ENCOUNTER — Telehealth: Payer: Self-pay | Admitting: Gastroenterology

## 2019-03-09 DIAGNOSIS — Z1159 Encounter for screening for other viral diseases: Secondary | ICD-10-CM

## 2019-03-09 LAB — SARS CORONAVIRUS 2 (TAT 6-24 HRS): SARS Coronavirus 2: NEGATIVE

## 2019-03-09 NOTE — Telephone Encounter (Signed)
Pt called stating Movi prep not covered and she picked up a Golytely prep but has no instructions--  Jeanne [name] sent new instructions to My chart 11-18- pt informed of this- she looked while on the phone with me and found the instructions- we did go over Golytely andhow to take- pt will read and ca;ll with further instructions

## 2019-03-12 ENCOUNTER — Other Ambulatory Visit: Payer: Self-pay

## 2019-03-12 ENCOUNTER — Encounter (INDEPENDENT_AMBULATORY_CARE_PROVIDER_SITE_OTHER): Payer: Self-pay | Admitting: Family Medicine

## 2019-03-12 ENCOUNTER — Ambulatory Visit (INDEPENDENT_AMBULATORY_CARE_PROVIDER_SITE_OTHER): Payer: Medicare Other | Admitting: Family Medicine

## 2019-03-12 ENCOUNTER — Ambulatory Visit
Admission: RE | Admit: 2019-03-12 | Discharge: 2019-03-12 | Disposition: A | Discharge status: Home / Self Care | Payer: Medicare Other | Source: Ambulatory Visit | Attending: Internal Medicine | Admitting: Internal Medicine

## 2019-03-12 VITALS — BP 124/76 | HR 92 | Temp 99.5°F | Ht 69.0 in | Wt 270.0 lb

## 2019-03-12 DIAGNOSIS — N189 Chronic kidney disease, unspecified: Secondary | ICD-10-CM

## 2019-03-12 DIAGNOSIS — R748 Abnormal levels of other serum enzymes: Secondary | ICD-10-CM

## 2019-03-12 DIAGNOSIS — E66813 Obesity, class 3: Secondary | ICD-10-CM

## 2019-03-12 DIAGNOSIS — Z794 Long term (current) use of insulin: Secondary | ICD-10-CM

## 2019-03-12 DIAGNOSIS — Z6841 Body Mass Index (BMI) 40.0 and over, adult: Secondary | ICD-10-CM | POA: Diagnosis not present

## 2019-03-12 DIAGNOSIS — E559 Vitamin D deficiency, unspecified: Secondary | ICD-10-CM | POA: Diagnosis not present

## 2019-03-12 DIAGNOSIS — E119 Type 2 diabetes mellitus without complications: Secondary | ICD-10-CM | POA: Diagnosis not present

## 2019-03-12 DIAGNOSIS — Z1231 Encounter for screening mammogram for malignant neoplasm of breast: Secondary | ICD-10-CM

## 2019-03-12 MED ORDER — VITAMIN D (ERGOCALCIFEROL) 1.25 MG (50000 UNIT) PO CAPS: 50000.0000 [IU] | ORAL_CAPSULE | ORAL | 0 refills | Status: DC

## 2019-03-13 ENCOUNTER — Ambulatory Visit (HOSPITAL_COMMUNITY): Payer: Medicare Other | Admitting: Anesthesiology

## 2019-03-13 ENCOUNTER — Other Ambulatory Visit: Payer: Self-pay | Admitting: *Deleted

## 2019-03-13 ENCOUNTER — Other Ambulatory Visit: Payer: Self-pay

## 2019-03-13 ENCOUNTER — Ambulatory Visit: Payer: Medicare Other | Admitting: Gastroenterology

## 2019-03-13 ENCOUNTER — Encounter (INDEPENDENT_AMBULATORY_CARE_PROVIDER_SITE_OTHER): Payer: Self-pay | Admitting: Family Medicine

## 2019-03-13 ENCOUNTER — Encounter: Payer: Self-pay | Admitting: Gastroenterology

## 2019-03-13 ENCOUNTER — Encounter (HOSPITAL_COMMUNITY)
Admission: RE | Disposition: A | Discharge status: Home / Self Care | Payer: Self-pay | Source: Ambulatory Visit | Attending: Gastroenterology

## 2019-03-13 ENCOUNTER — Ambulatory Visit (HOSPITAL_COMMUNITY)
Admission: RE | Admit: 2019-03-13 | Discharge: 2019-03-13 | Disposition: A | Discharge status: Home / Self Care | Payer: Medicare Other | Source: Ambulatory Visit | Attending: Gastroenterology | Admitting: Gastroenterology

## 2019-03-13 VITALS — BP 129/70 | HR 77 | Temp 98.8°F | Ht 69.0 in | Wt 286.0 lb

## 2019-03-13 DIAGNOSIS — K648 Other hemorrhoids: Secondary | ICD-10-CM | POA: Diagnosis not present

## 2019-03-13 DIAGNOSIS — E1140 Type 2 diabetes mellitus with diabetic neuropathy, unspecified: Secondary | ICD-10-CM | POA: Insufficient documentation

## 2019-03-13 DIAGNOSIS — K625 Hemorrhage of anus and rectum: Secondary | ICD-10-CM

## 2019-03-13 DIAGNOSIS — F259 Schizoaffective disorder, unspecified: Secondary | ICD-10-CM | POA: Insufficient documentation

## 2019-03-13 DIAGNOSIS — Z6841 Body Mass Index (BMI) 40.0 and over, adult: Secondary | ICD-10-CM | POA: Diagnosis not present

## 2019-03-13 DIAGNOSIS — I10 Essential (primary) hypertension: Secondary | ICD-10-CM | POA: Diagnosis not present

## 2019-03-13 DIAGNOSIS — K5909 Other constipation: Secondary | ICD-10-CM

## 2019-03-13 DIAGNOSIS — K635 Polyp of colon: Secondary | ICD-10-CM

## 2019-03-13 DIAGNOSIS — M069 Rheumatoid arthritis, unspecified: Secondary | ICD-10-CM | POA: Insufficient documentation

## 2019-03-13 DIAGNOSIS — G4733 Obstructive sleep apnea (adult) (pediatric): Secondary | ICD-10-CM | POA: Diagnosis not present

## 2019-03-13 DIAGNOSIS — T884XXA Failed or difficult intubation, initial encounter: Secondary | ICD-10-CM

## 2019-03-13 DIAGNOSIS — N182 Chronic kidney disease, stage 2 (mild): Secondary | ICD-10-CM | POA: Diagnosis not present

## 2019-03-13 DIAGNOSIS — T884XXD Failed or difficult intubation, subsequent encounter: Secondary | ICD-10-CM

## 2019-03-13 DIAGNOSIS — K5900 Constipation, unspecified: Secondary | ICD-10-CM | POA: Insufficient documentation

## 2019-03-13 DIAGNOSIS — Z794 Long term (current) use of insulin: Secondary | ICD-10-CM | POA: Diagnosis not present

## 2019-03-13 DIAGNOSIS — E785 Hyperlipidemia, unspecified: Secondary | ICD-10-CM | POA: Diagnosis not present

## 2019-03-13 DIAGNOSIS — E1122 Type 2 diabetes mellitus with diabetic chronic kidney disease: Secondary | ICD-10-CM | POA: Diagnosis not present

## 2019-03-13 DIAGNOSIS — Z888 Allergy status to other drugs, medicaments and biological substances status: Secondary | ICD-10-CM | POA: Diagnosis not present

## 2019-03-13 DIAGNOSIS — F17210 Nicotine dependence, cigarettes, uncomplicated: Secondary | ICD-10-CM | POA: Insufficient documentation

## 2019-03-13 HISTORY — DX: Failed or difficult intubation, initial encounter: T88.4XXA

## 2019-03-13 HISTORY — PX: POLYPECTOMY: SHX5525

## 2019-03-13 HISTORY — PX: COLONOSCOPY WITH PROPOFOL: SHX5780

## 2019-03-13 LAB — GLUCOSE, CAPILLARY
Glucose-Capillary: 78 mg/dL (ref 70–99)
Glucose-Capillary: 84 mg/dL (ref 70–99)

## 2019-03-13 SURGERY — COLONOSCOPY WITH PROPOFOL
Anesthesia: Monitor Anesthesia Care

## 2019-03-13 MED ORDER — LACTATED RINGERS IV SOLN
INTRAVENOUS | Status: DC
  Administered 2019-03-13: 12:00:00 via INTRAVENOUS

## 2019-03-13 MED ORDER — PROPOFOL 500 MG/50ML IV EMUL
INTRAVENOUS | Status: AC
  Filled 2019-03-13: qty 50

## 2019-03-13 MED ORDER — SODIUM CHLORIDE 0.9 % IV SOLN
INTRAVENOUS | Status: DC
  Administered 2019-03-13: 1000 mL via INTRAVENOUS

## 2019-03-13 MED ORDER — SODIUM CHLORIDE 0.9 % IV SOLN: 500.0000 mL | Freq: Once | INTRAVENOUS | Status: DC

## 2019-03-13 MED ORDER — PROPOFOL 500 MG/50ML IV EMUL
INTRAVENOUS | Status: DC | PRN
  Administered 2019-03-13: 130 ug/kg/min via INTRAVENOUS

## 2019-03-13 MED ORDER — ONDANSETRON HCL 4 MG/2ML IJ SOLN
INTRAMUSCULAR | Status: DC | PRN
  Administered 2019-03-13: 4 mg via INTRAVENOUS

## 2019-03-13 MED ORDER — PROPOFOL 10 MG/ML IV BOLUS
INTRAVENOUS | Status: DC | PRN
  Administered 2019-03-13: 20 mg via INTRAVENOUS

## 2019-03-13 SURGICAL SUPPLY — 22 items

## 2019-03-13 NOTE — Transfer of Care (Signed)
Immediate Anesthesia Transfer of Care Note  Patient: Jeanne [name]  Procedure(s) Performed: COLONOSCOPY WITH PROPOFOL (N/A ) POLYPECTOMY  Patient Location: PACU and Endoscopy Unit  Anesthesia Type:MAC  Level of Consciousness: awake and alert   Airway & Oxygen Therapy: Patient Spontanous Breathing and Patient connected to face mask oxygen  Post-op Assessment: Report given to RN and Post -op Vital signs reviewed and stable  Post vital signs: Reviewed and stable  Last Vitals:  Vitals Value Taken Time  BP 118/66 03/13/19 1232  Temp 36.8 C 03/13/19 1232  Pulse 62 03/13/19 1234  Resp 17 03/13/19 1234  SpO2 100 % 03/13/19 1234  Vitals shown include unvalidated device data.  Last Pain:  Vitals:   03/13/19 1232  TempSrc: Oral  PainSc: 0-No pain         Complications: No apparent anesthesia complications

## 2019-03-13 NOTE — Op Note (Signed)
[facility] Patient Name: Jeanne [name] Procedure Date: 03/13/2019 MRN: [id number] Attending MD: Estill Cotta. Loletha Carrow , MD Date of Birth: May 18, 1960 CSN: [id number] Age: 58 Admit Type: Outpatient Procedure:                Colonoscopy Indications:              Rectal bleeding (recent - chronic constipation felt                            likely due to multiple meds. also Hx two adenomas                            in 2013 at outside clinic) Providers:                Mallie Mussel L. Loletha Carrow, MD, Glori Bickers, RN, Janeece Agee,                            Technician, Cletis Athens, Technician Referring MD:             Velna Hatchet Medicines:                Monitored Anesthesia Care ([facility] DUE TO HISTORY OF                            DIFFICULT AIRWAY) Complications:            No immediate complications. Estimated Blood Loss:     Estimated blood loss: none. Procedure:                Pre-Anesthesia Assessment:                           - Prior to the procedure, a History and Physical                            was performed, and patient medications and                            allergies were reviewed. The patient's tolerance of                            previous anesthesia was also reviewed. The risks                            and benefits of the procedure and the sedation                            options and risks were discussed with the patient.                            All questions were answered, and informed consent  was obtained. Prior Anticoagulants: The patient has                            taken no previous anticoagulant or antiplatelet                            agents. ASA Grade Assessment: III - A patient with                            severe systemic disease. After reviewing the risks                            and benefits, the patient was deemed in        satisfactory condition to undergo the procedure.                           After obtaining informed consent, the colonoscope                            was passed under direct vision. Throughout the                            procedure, the patient's blood pressure, pulse, and                            oxygen saturations were monitored continuously. The                            [id number]) Olympus colonoscope was                            introduced through the anus and advanced to the the                            cecum, identified by appendiceal orifice and                            ileocecal valve. The colonoscopy was somewhat                            difficult due to a redundant colon, significant                            looping and the patient's body habitus. Successful                            completion of the procedure was aided by changing                            the patient to a supine position, changing the                            patient to a prone position and using manual  pressure. The patient tolerated the procedure                            fairly well. The quality of the bowel preparation                            was good after lavage . The ileocecal valve,                            appendiceal orifice, and rectum were photographed. Scope In: 12:00:53 PM Scope Out: 12:24:42 PM Scope Withdrawal Time: 0 hours 13 minutes 20 seconds  Total Procedure Duration: 0 hours 23 minutes 49 seconds  Findings:      The perianal and digital rectal examinations were normal.      A 4 mm polyp was found in the distal transverse colon. The polyp was       pedunculated. The polyp was removed with a hot snare. Resection and       retrieval were complete.      Small internal hemorrhoids were found.      The exam was otherwise without abnormality on direct and retroflexion       views. Impression:               - One 4 mm polyp in  the distal transverse colon,                            removed with a hot snare. Resected and retrieved.                           - Internal hemorrhoids.(bleeding source,                            exacerbated by constipation)                           - The examination was otherwise normal on direct                            and retroflexion views. Moderate Sedation:      Not Applicable - Patient had care per Anesthesia. Recommendation:           - Patient has a contact number available for                            emergencies. The signs and symptoms of potential                            delayed complications were discussed with the                            patient. Return to normal activities tomorrow.                            Written discharge instructions were provided to the  patient.                           - Resume previous diet.                           - Continue present medications, including low dose                            miralax as advised during recent office visit.                            Return to GI office as needed for further                            assistance.                           - Await pathology results.                           - Repeat colonoscopy is recommended for                            surveillance. The colonoscopy date will be                            determined after pathology results from today's                            exam become available for review. Procedure Code(s):        --- Professional ---                           [phone], Colonoscopy, flexible; with removal of                            tumor(s), polyp(s), or other lesion(s) by snare                            technique Diagnosis Code(s):        --- Professional ---                           K63.5, Polyp of colon                           K64.8, Other hemorrhoids                           K62.5, Hemorrhage of anus and rectum CPT  copyright 2019 American Medical Association. All rights reserved. The codes documented in this report are preliminary and upon coder review may  be revised to meet current compliance requirements. Aiden Rao L. Loletha Carrow, MD 03/13/2019 12:43:49 PM This report has been signed electronically. Number of Addenda: 0

## 2019-03-13 NOTE — Interval H&P Note (Signed)
History and Physical Interval Note:  03/13/2019 11:36 AM  Jeanne [name]  has presented today for surgery, with the diagnosis of Colonoscopy screening.  The various methods of treatment have been discussed with the patient and family. After consideration of risks, benefits and other options for treatment, the patient has consented to  Procedure(s): COLONOSCOPY WITH PROPOFOL (N/A) as a surgical intervention.  The patient's history has been reviewed, patient examined, no change in status, stable for surgery.  I have reviewed the patient's chart and labs.  Questions were answered to the patient's satisfaction.     Nelida Meuse III

## 2019-03-13 NOTE — Progress Notes (Signed)
History reviewed today  Temp, LC VS CW

## 2019-03-13 NOTE — Anesthesia Procedure Notes (Signed)
Procedure Name: MAC Date/Time: 03/13/2019 11:53 AM Performed by: Cynda Familia, CRNA Pre-anesthesia Checklist: Patient identified, Emergency Drugs available, Suction available, Patient being monitored and Timeout performed Patient Re-evaluated:Patient Re-evaluated prior to induction Oxygen Delivery Method: Simple face mask Placement Confirmation: positive ETCO2 and breath sounds checked- equal and bilateral Dental Injury: Teeth and Oropharynx as per pre-operative assessment  Comments: By Smyth CRNA

## 2019-03-13 NOTE — Progress Notes (Signed)
Office: [phone]  /  Fax: [phone]   HPI:   Chief Complaint: OBESITY Jeanne [name] is here to discuss her progress with her obesity treatment plan. She is on the Category 2 plan and is following her eating plan approximately 90 % of the time. She states she is doing reading water for 40 minutes 3 times per week. Jeanne [name] is doing well with her diet. At first, she struggled with timing of meals to decrease night hunger, but now this has improved. She is down 11 lbs, 4 lbs due to water weight. Her weight is 270 lb (122.5 kg) today and has had a weight loss of 11 pounds over a period of 2 weeks since her last visit. She has lost 11 lbs since starting treatment with [name].  Diabetes II Jeanne [name] has a diagnosis of diabetes type II. Jeanne [name] is taking metformin 1,000 mg PO q AM. She has a history of Vitamin B12 deficiency, and she is taking B12. Last A1c was 6.3 on 02/26/2019. She denies hypoglycemia. She has been working on intensive lifestyle modifications including diet, exercise, and weight loss to help control her blood glucose levels.  Vitamin D Deficiency Jeanne [name] has a diagnosis of vitamin D deficiency. She is not currently taking Vit D and denies nausea, vomiting or muscle weakness.  Chronic Kidney Disease Jeanne [name] has a diagnosis of chronic kidney disease. Her kidney functions is at baseline. No microalbuminuria.  Elevated Alkaline Phosphatase Jeanne [name] alkaline phosphatase has minimal elevation. She still has her gallbladder. She denies right upper quadrant pain.  ASSESSMENT AND PLAN:  Type 2 diabetes mellitus without complication, with long-term current use of insulin (HCC)  Vitamin D deficiency - Plan: Vitamin D, Ergocalciferol, (DRISDOL) 1.25 MG (50000 UT) CAPS capsule  Chronic kidney disease, unspecified CKD stage  Elevated alkaline phosphatase level  Class 3 severe obesity with serious comorbidity and body mass index (BMI) of 40.0 to 44.9 in adult, unspecified  obesity type ([place])  PLAN:  Diabetes II Jeanne [name] has been given extensive diabetes education by myself today including ideal fasting and post-prandial blood glucose readings, individual ideal Hgb A1c goals and hypoglycemia prevention. We discussed the importance of good blood sugar control to decrease the likelihood of diabetic complications such as nephropathy, neuropathy, limb loss, blindness, coronary artery disease, and death. We discussed the importance of intensive lifestyle modification including diet, exercise and weight loss as the first line treatment for diabetes. Jeanne [name] agrees to continue her current diabetes medications management. Jeanne [name] agrees to follow up with our clinic in 2 weeks.  Vitamin D Deficiency Jeanne [name] was informed that low vitamin D levels contributes to fatigue and are associated with obesity, breast, and colon cancer. Jeanne [name] agrees to start prescription Vit D 50,000 IU every week #4 with no refills. She will follow up for routine testing of vitamin D, at least 2-3 times per year. She was informed of the risk of over-replacement of vitamin D and agrees to not increase her dose unless she discusses this with [name] first. Jeanne [name] agrees to follow up with our clinic in 2 weeks.  Chronic Kidney Disease We will continue to monitor. Krystle is to avoid nephrotoxic medications.   Elevated Alkaline Phosphatase We will continue to monitor her LFTs, and Naja is to follow up with [name] as directed.  Obesity Jeanne [name] is currently in the action stage of change. As such, her goal is to continue with weight loss efforts She has agreed to follow the Category 2 plan Jeanne [name] has been instructed to work up to  a goal of 150 minutes of combined cardio and strengthening exercise per week or as tolerated for weight loss and overall health benefits. We discussed the following Behavioral Modification Strategies today: increasing lean protein intake, decreasing simple  carbohydrates, increasing vegetables, increase H20 intake, work on meal planning and easy cooking plans and holiday eating strategies    Jeanne [name] has agreed to follow up with our clinic in 2 weeks. She was informed of the importance of frequent follow up visits to maximize her success with intensive lifestyle modifications for her multiple health conditions.  ALLERGIES: Allergies  Allergen Reactions  . Ace Inhibitors Anaphylaxis  . Arava [Leflunomide] Other (See Comments)    Nephritis and kidney clearance issues  . Travatan [Travoprost] Other (See Comments)    Red eyes, irritation     MEDICATIONS: No current facility-administered medications on file prior to visit.    Current Outpatient Medications on File Prior to Visit  Medication Sig Dispense Refill  . amLODipine (NORVASC) 5 MG tablet Take 5 mg by mouth every morning.     . ARIPiprazole (ABILIFY) 30 MG tablet Take 1 tablet (30 mg total) by mouth daily. 90 tablet 1  . aspirin EC 81 MG tablet Take 162 mg by mouth daily.    Jeanne [name] atorvastatin (LIPITOR) 20 MG tablet Take 1 tablet by mouth daily.    Jeanne [name] buPROPion (WELLBUTRIN XL) 300 MG 24 hr tablet TAKE 1 TABLET(300 MG) BY MOUTH EVERY MORNING 90 tablet 1  . Calcium Carb-Cholecalciferol 600-800 MG-UNIT TABS Take 1 tablet by mouth daily.    . DULoxetine (CYMBALTA) 30 MG capsule Take 3 capsules (90 mg total) by mouth daily. 270 capsule 1  . empagliflozin (JARDIANCE) 10 MG TABS tablet Take 10 mg by mouth daily.    Jeanne [name] esomeprazole (NEXIUM) 40 MG capsule Take 40 mg by mouth daily before breakfast.    . etanercept (ENBREL SURECLICK) 50 MG/ML injection Inject 50 mg into the skin once a week. Tuesday    . furosemide (LASIX) 40 MG tablet Take 40 mg by mouth.    Jeanne [name] glucosamine-chondroitin 500-400 MG tablet Take 2 tablets by mouth every morning.    . insulin glargine (LANTUS) 100 UNIT/ML injection Inject 12 Units into the skin daily.     Jeanne [name] 300 MG CAPS Take 300 mg by mouth daily.    Jeanne [name]  levobunolol (BETAGAN) 0.5 % ophthalmic solution Place 1 drop into both eyes every morning.     . metFORMIN (GLUCOPHAGE) 1000 MG tablet Take 1,000 mg by mouth daily with breakfast.     . Multiple Vitamin (MULTIVITAMIN WITH MINERALS) TABS tablet Take 1 tablet by mouth daily.    Jeanne [name] NIFEdipine (PROCARDIA-XL/ADALAT CC) 60 MG 24 hr tablet Take 1 tablet (60 mg total) by mouth daily. 30 tablet 1  . oxyCODONE-acetaminophen (PERCOCET/ROXICET) 5-325 MG tablet Take 1-2 tablets by mouth every 4 (four) hours as needed for severe pain.    Jeanne [name] PRESCRIPTION MEDICATION Inhale 1 application into the lungs at bedtime. BiPAP setting 8/12    . QUEtiapine (SEROQUEL) 300 MG tablet Take 2 tablets (600 mg total) by mouth at bedtime. 180 tablet 1  . Turmeric POWD Take 1,500 mcg by mouth daily at 12 noon.     . verapamil (CALAN-SR) 180 MG CR tablet Take 1 tablet (180 mg total) by mouth daily. 30 tablet 1    PAST MEDICAL HISTORY: Past Medical History:  Diagnosis Date  . Antiphospholipid antibody syndrome ([place])   . Antiphospholipid antibody syndrome ([place])   .  Anxiety   . Arthritis   . Bilateral swelling of feet   . Carpal tunnel syndrome 01/02/2014   Bilateral  . Clotting disorder ([place])   . Constipation   . Depression   . Diabetes mellitus type 2, insulin dependent ([place]) 02/26/2019  . Diabetes mellitus, type II ([place])   . Difficult airway for intubation 03/13/2019  . Difficult intubation 07/09/2013   Glidescope used, see Anesthesia note  . DVT (deep venous thrombosis) ([place]) 2014   LEFT LEG  . Gastritis   . Glaucoma   . Heart murmur   . History of blood clots   . Hyperlipidemia   . Hypertension   . Hypokalemia 03/05/2013  . Kidney disease    stage ll  . Neuromuscular disorder ([place])    " NEUROPATHY IN  MY HANDS"  . Obsessive-compulsive disorder   . Osteoarthritis   . Rheumatoid arthritis ([place])   . Rheumatoid arthritis ([place]) 2016  . Schizo-affective psychosis ([place])   . Schizoaffective disorder ([place])   .  Scleritis of both eyes   . Sleep apnea    Bipap  . Suppurative hidradenitis   . Undifferentiated connective tissue disease ([place])     PAST SURGICAL HISTORY: Past Surgical History:  Procedure Laterality Date  . BUNIONECTOMY    . CERVICAL CONIZATION W/BX    . LAPAROSCOPIC ADRENALECTOMY Left 07/09/2013   Procedure: LAPAROSCOPIC LEFT  ADRENALECTOMY;  Surgeon: Earnstine Regal, MD;  Location: [facility];  Service: General;  Laterality: Left;  . sweat gland removal  1975    SOCIAL HISTORY: Social History   Tobacco Use  . Smoking status: Current Every Day Smoker    Packs/day: 0.50    Years: 25.00    Pack years: 12.50    Types: Cigarettes  . Smokeless tobacco: Never Used  Substance Use Topics  . Alcohol use: Yes    Alcohol/week: 0.0 standard drinks    Comment: " drink just on weekends " Last drink was in the spring of 2016  . Drug use: No    FAMILY HISTORY: Family History  Problem Relation Age of Onset  . Cancer Maternal Aunt        breast ca  . Breast cancer Maternal Aunt   . Heart attack Maternal Grandmother 54  . Heart attack Maternal Grandfather 62  . Hypertension Sister   . Hypertension Sister   . Diabetes Mother   . Hypertension Mother   . Hyperlipidemia Mother   . Stroke Mother   . Depression Mother   . Eating disorder Mother   . Hypertension Father   . Seizures Maternal Uncle   . Alcohol abuse Neg Hx   . Anxiety disorder Neg Hx   . Bipolar disorder Neg Hx   . Drug abuse Neg Hx   . Schizophrenia Neg Hx   . Colon cancer Neg Hx   . Esophageal cancer Neg Hx   . Stomach cancer Neg Hx   . Rectal cancer Neg Hx     ROS: Review of Systems  Constitutional: Positive for weight loss.  Gastrointestinal: Negative for nausea and vomiting.  Musculoskeletal:       Negative muscle weakness  Endo/Heme/Allergies:       Negative hypoglycemia    PHYSICAL EXAM: Blood pressure 124/76, pulse 92, temperature 99.5 F (37.5 C), temperature source Oral, height 5\' 9"  (1.753 m),  weight 270 lb (122.5 kg), SpO2 97 %. Body mass index is 39.87 kg/m. Physical Exam Vitals signs reviewed.  Constitutional:  Appearance: Normal appearance. She is obese.  Cardiovascular:     Rate and Rhythm: Normal rate.     Pulses: Normal pulses.  Pulmonary:     Effort: Pulmonary effort is normal.     Breath sounds: Normal breath sounds.  Musculoskeletal: Normal range of motion.  Skin:    General: Skin is warm and dry.  Neurological:     Mental Status: She is alert and oriented to person, place, and time.  Psychiatric:        Mood and Affect: Mood normal.        Behavior: Behavior normal.     RECENT LABS AND TESTS: BMET    Component Value Date/Time   NA 138 02/26/2019 0950   K 4.3 02/26/2019 0950   CL 99 02/26/2019 0950   CO2 27 02/26/2019 0950   GLUCOSE 103 (H) 02/26/2019 0950   GLUCOSE 151 (H) 02/01/2018 0800   BUN 20 02/26/2019 0950   CREATININE 1.27 (H) 02/26/2019 0950   CALCIUM 9.7 02/26/2019 0950   GFRNONAA 47 (L) 02/26/2019 0950   GFRAA 54 (L) 02/26/2019 0950   Lab Results  Component Value Date   HGBA1C 6.3 (H) 02/26/2019   Lab Results  Component Value Date   INSULIN 21.0 02/26/2019   CBC    Component Value Date/Time   WBC 5.1 02/26/2019 0950   WBC 3.7 (L) 02/01/2018 0800   RBC 4.74 02/26/2019 0950   RBC 4.12 02/01/2018 0800   HGB 15.5 02/26/2019 0950   HCT 44.1 02/26/2019 0950   PLT 206 02/26/2019 0950   MCV 93 02/26/2019 0950   MCH 32.7 02/26/2019 0950   MCH 33.3 02/01/2018 0800   MCHC 35.1 02/26/2019 0950   MCHC 34.0 02/01/2018 0800   RDW 13.1 02/26/2019 0950   LYMPHSABS 2.3 02/26/2019 0950   MONOABS 0.7 10/22/2014 1454   EOSABS 0.2 02/26/2019 0950   BASOSABS 0.0 02/26/2019 0950   Iron/TIBC/Ferritin/ %Sat No results found for: IRON, TIBC, FERRITIN, IRONPCTSAT Lipid Panel     Component Value Date/Time   CHOL 173 02/26/2019 0950   TRIG 132 02/26/2019 0950   HDL 69 02/26/2019 0950   LDLCALC 81 02/26/2019 0950   Hepatic Function  Panel     Component Value Date/Time   PROT 7.5 02/26/2019 0950   ALBUMIN 4.7 02/26/2019 0950   AST 20 02/26/2019 0950   ALT 29 02/26/2019 0950   ALKPHOS 118 (H) 02/26/2019 0950   BILITOT 0.4 02/26/2019 0950      Component Value Date/Time   TSH 0.845 02/26/2019 0950   TSH 1.639 03/05/2013 1500      OBESITY BEHAVIORAL INTERVENTION VISIT  Today's visit was # 2   Starting weight: 281 lbs Starting date: 02/26/2019 Today's weight :270 lbs  Today's date: 03/12/2019 Total lbs lost to date: 11 At least 15 minutes were spent on discussing the following behavioral intervention visit.   ASK: We discussed the diagnosis of obesity with Clarabelle Niue today and Dora agreed to give [name] permission to discuss obesity behavioral modification therapy today.  ASSESS: Shaqueena has the diagnosis of obesity and her BMI today is 39.85 Rox is in the action stage of change   ADVISE: Maaria was educated on the multiple health risks of obesity as well as the benefit of weight loss to improve her health. She was advised of the need for long term treatment and the importance of lifestyle modifications to improve her current health and to decrease her risk of future health problems.  AGREE:  Multiple dietary modification options and treatment options were discussed and  Kritika agreed to follow the recommendations documented in the above note.  ARRANGE: Dinetta was educated on the importance of frequent visits to treat obesity as outlined per CMS and USPSTF guidelines and agreed to schedule her next follow up appointment today.  Wilhemena Durie, am acting as transcriptionist for [name], DO  I have reviewed the above documentation for accuracy and completeness, and I agree with the above. [name], DO

## 2019-03-13 NOTE — H&P (Signed)
History:  This patient presents for endoscopic testing for rectal bleeding.  Jeanne [name] Referring physician: Velna Hatchet, MD  Past Medical History: Past Medical History:  Diagnosis Date  . Antiphospholipid antibody syndrome ([place])   . Antiphospholipid antibody syndrome ([place])   . Anxiety   . Arthritis   . Bilateral swelling of feet   . Carpal tunnel syndrome 01/02/2014   Bilateral  . Clotting disorder ([place])   . Constipation   . Depression   . Diabetes mellitus type 2, insulin dependent ([place]) 02/26/2019  . Diabetes mellitus, type II ([place])   . Difficult airway for intubation 03/13/2019  . Difficult intubation 07/09/2013   Glidescope used, see Anesthesia note  . DVT (deep venous thrombosis) ([place]) 2014   LEFT LEG  . Gastritis   . Glaucoma   . Heart murmur   . History of blood clots   . Hyperlipidemia   . Hypertension   . Hypokalemia 03/05/2013  . Kidney disease    stage ll  . Neuromuscular disorder ([place])    " NEUROPATHY IN  MY HANDS"  . Obsessive-compulsive disorder   . Osteoarthritis   . Rheumatoid arthritis ([place])   . Rheumatoid arthritis ([place]) 2016  . Schizo-affective psychosis ([place])   . Schizoaffective disorder ([place])   . Scleritis of both eyes   . Sleep apnea    Bipap  . Suppurative hidradenitis   . Undifferentiated connective tissue disease ([place])      Past Surgical History: Past Surgical History:  Procedure Laterality Date  . BUNIONECTOMY    . CERVICAL CONIZATION W/BX    . LAPAROSCOPIC ADRENALECTOMY Left 07/09/2013   Procedure: LAPAROSCOPIC LEFT  ADRENALECTOMY;  Surgeon: Earnstine Regal, MD;  Location: [facility];  Service: General;  Laterality: Left;  . sweat gland removal  1975    Allergies: Allergies  Allergen Reactions  . Ace Inhibitors Anaphylaxis  . Arava [Leflunomide] Other (See Comments)    Nephritis and kidney clearance issues  . Travatan [Travoprost] Other (See Comments)    Red eyes, irritation     Outpatient Meds: Current  Facility-Administered Medications  Medication Dose Route Frequency Provider Last Rate Last Dose  . 0.9 %  sodium chloride infusion   Intravenous Continuous Nelida Meuse III, MD 20 mL/hr at 03/13/19 1103 1,000 mL at 03/13/19 1103  . lactated ringers infusion   Intravenous Continuous Danis, Estill Cotta III, MD          ___________________________________________________________________ Objective   Exam:  BP (!) 144/69   Pulse 61   Temp 98.2 F (36.8 C) (Oral)   Resp 16   SpO2 98%    CV: RRR without murmur, S1/S2, no JVD, no peripheral edema  Resp: clear to auscultation bilaterally, normal RR and effort noted  GI: soft, no tenderness, with active bowel sounds. No guarding or palpable organomegaly noted.  Neuro: awake, alert and oriented x 3. Normal gross motor function and fluent speech   Assessment:  Rectal bleeding  Plan:  colonoscopy   Nelida Meuse III

## 2019-03-13 NOTE — Progress Notes (Signed)
Office: [phone]  /  Fax: [phone]    Date: March 27, 2019   Time Seen: 8:02am Duration: 26 minutes Provider: Glennie Isle, Psy.D. Type of Session: Individual Therapy  Type of Contact: Face-to-face  Session Content: Jeanne [name] is a 58 y.o. female presenting for a follow-up appointment to address the previously established treatment goal of decreasing emotional eating. The session was initiated with the administration of the PHQ-9 and GAD-7, as well as a brief check-in. Jeanne [name] reported, "I've used the sheet you gave me about emotional hunger. It helped a lot." She reported deviations from the meal plan during the Thanksgiving holiday and a couple days following it, but reported she resumed eating congruent to the meal plan. Psychoeducation regarding triggers for emotional eating was provided. Jeanne [name] was provided a handout, and encouraged to utilize the handout to increase awareness of triggers and frequency. Jeanne [name] agreed. This provider also discussed behavioral strategies for specific triggers, such as placing the utensil down when conversing to avoid mindless eating.  Jeanne [name] was receptive to today's appointment as evidenced by openness to sharing, responsiveness to feedback, and willingness to explore triggers for emotional eating.  Mental Status Examination:  Appearance: appropriate grooming and hygiene and casually dressed Behavior: appropriate to circumstances Mood: euthymic Affect: mood congruent and normal range Speech: normal in rate, volume, and tone Eye Contact: appropriate Psychomotor Activity: appropriate Gait: normal Thought Process: linear, logical, and goal directed  Thought Content/Perception: denies suicidal and homicidal ideation, plan, and intent and no hallucinations, delusions, bizarre thinking or behavior reported or observed Orientation: time, person, place and purpose of appointment Memory/Concentration: memory, attention, language, and fund of  knowledge intact  Insight/Judgment: good  Structured Assessments Results: The Patient Health Questionnaire-9 (PHQ-9) is a self-report measure that assesses symptoms and severity of depression over the course of the last two weeks. Jeanne [name] obtained a score of 0. [0= Not at all; 1= Several days; 2= More than half the days; 3= Nearly every day] Little interest or pleasure in doing things 0  Feeling down, depressed, or hopeless 0  Trouble falling or staying asleep, or sleeping too much 0  Feeling tired or having little energy 0  Poor appetite or overeating 0  Feeling bad about yourself --- or that you are a failure or have let yourself or your family down 0  Trouble concentrating on things, such as reading the newspaper or watching television 0  Moving or speaking so slowly that other people could have noticed? Or the opposite --- being so fidgety or restless that you have been moving around a lot more than usual 0  Thoughts that you would be better off dead or hurting yourself in some way 0  PHQ-9 Score 0    The Generalized Anxiety Disorder-7 (GAD-7) is a brief self-report measure that assesses symptoms of anxiety over the course of the last two weeks. Jeanne [name] obtained a score of 0. [0= Not at all; 1= Several days; 2= Over half the days; 3= Nearly every day] Feeling nervous, anxious, on edge 0  Not being able to stop or control worrying 0  Worrying too much about different things 0  Trouble relaxing 0  Being so restless that it's hard to sit still 0  Becoming easily annoyed or irritable 0  Feeling afraid as if something awful might happen 0  GAD-7 Score 0   Interventions:  Conducted a brief chart review Verbally administered PHQ-9 and GAD-7 for symptom monitoring Provided empathic reflections and validation Reviewed content from the previous session  Employed supportive psychotherapy interventions to facilitate reduced distress and to improve coping skills with identified stressors  Employed motivational interviewing skills to assess patient's willingness/desire to adhere to recommended medical treatments and assignments Psychoeducation provided regarding triggers for emotional eating  DSM-5 Diagnosis: 295.70 (F25.1) Schizoaffective Disorder, Depressive Type  Treatment Goal & Progress: During the initial appointment with this provider, the following treatment goal was established: decrease emotional eating. Jeanne [name] has demonstrated some progress in her goal as evidenced by increased awareness of hunger patterns.   Plan: Jeanne [name] declined future appointments with this provider. She noted, "I think I got it." She acknowledged understanding that she may request a follow-up appointment with this provider in the future as long as she is still established with the clinic. In addition, Jeanne [name] reported she continues to meet with her other therapist, Trey Paula, every couple weeks and their next scheduled appointment is on April 11, 2019. No further follow-up planned by this provider.

## 2019-03-13 NOTE — Anesthesia Postprocedure Evaluation (Signed)
Anesthesia Post Note  Patient: Jeanne [name]  Procedure(s) Performed: COLONOSCOPY WITH PROPOFOL (N/A ) POLYPECTOMY     Patient location during evaluation: PACU Anesthesia Type: MAC Level of consciousness: awake and alert Pain management: pain level controlled Vital Signs Assessment: post-procedure vital signs reviewed and stable Respiratory status: spontaneous breathing, nonlabored ventilation, respiratory function stable and patient connected to nasal cannula oxygen Cardiovascular status: stable and blood pressure returned to baseline Postop Assessment: no apparent nausea or vomiting Anesthetic complications: no    Last Vitals:  Vitals:   03/13/19 1235 03/13/19 1240  BP: 118/66 115/60  Pulse: 61 62  Resp: 20 15  Temp:    SpO2: 100% 99%    Last Pain:  Vitals:   03/13/19 1240  TempSrc:   PainSc: 0-No pain                 Effie Berkshire

## 2019-03-13 NOTE — Discharge Instructions (Signed)
YOU HAD AN ENDOSCOPIC PROCEDURE TODAY: Refer to the procedure report and other information in the discharge instructions given to you for any specific questions about what was found during the examination. If this information does not answer your questions, please call [place] office at [phone] to clarify.  ° °YOU SHOULD EXPECT: Some feelings of bloating in the abdomen. Passage of more gas than usual. Walking can help get rid of the air that was put into your GI tract during the procedure and reduce the bloating. If you had a lower endoscopy (such as a colonoscopy or flexible sigmoidoscopy) you may notice spotting of blood in your stool or on the toilet paper. Some abdominal soreness may be present for a day or two, also. ° °DIET: Your first meal following the procedure should be a light meal and then it is ok to progress to your normal diet. A half-sandwich or bowl of soup is an example of a good first meal. Heavy or fried foods are harder to digest and may make you feel nauseous or bloated. Drink plenty of fluids but you should avoid alcoholic beverages for 24 hours. If you had a esophageal dilation, please see attached instructions for diet.   ° °ACTIVITY: Your care partner should take you home directly after the procedure. You should plan to take it easy, moving slowly for the rest of the day. You can resume normal activity the day after the procedure however YOU SHOULD NOT DRIVE, use power tools, machinery or perform tasks that involve climbing or major physical exertion for 24 hours (because of the sedation medicines used during the test).  ° °SYMPTOMS TO REPORT IMMEDIATELY: °A gastroenterologist can be reached at any hour. Please call [phone]  for any of the following symptoms:  °Following lower endoscopy (colonoscopy, flexible sigmoidoscopy) °Excessive amounts of blood in the stool  °Significant tenderness, worsening of abdominal pains  °Swelling of the abdomen that is new, acute  °Fever of 100° or  higher  °Following upper endoscopy (EGD, EUS, ERCP, esophageal dilation) °Vomiting of blood or coffee ground material  °New, significant abdominal pain  °New, significant chest pain or pain under the shoulder blades  °Painful or persistently difficult swallowing  °New shortness of breath  °Black, tarry-looking or red, bloody stools ° °FOLLOW UP:  °If any biopsies were taken you will be contacted by phone or by letter within the next 1-3 weeks. Call [phone]  if you have not heard about the biopsies in 3 weeks.  °Please also call with any specific questions about appointments or follow up tests. ° °

## 2019-03-13 NOTE — Anesthesia Preprocedure Evaluation (Signed)
Anesthesia Evaluation  Patient identified by MRN, date of birth, ID band Patient awake    Reviewed: Allergy & Precautions, NPO status , Patient's Chart, lab work & pertinent test results  Airway Mallampati: II  TM Distance: >3 FB     Dental  (+) Dental Advisory Given   Pulmonary sleep apnea , Current Smoker,    breath sounds clear to auscultation       Cardiovascular hypertension, Pt. on medications + DVT   Rhythm:Regular Rate:Normal     Neuro/Psych negative neurological ROS     GI/Hepatic negative GI ROS, Neg liver ROS,   Endo/Other  diabetes, Type 2, Insulin DependentMorbid obesity  Renal/GU Renal disease     Musculoskeletal  (+) Arthritis ,   Abdominal   Peds  Hematology negative hematology ROS (+)   Anesthesia Other Findings   Reproductive/Obstetrics                             Anesthesia Physical Anesthesia Plan  ASA: II  Anesthesia Plan: MAC   Post-op Pain Management:    Induction: Intravenous  PONV Risk Score and Plan: 1 and Propofol infusion, Ondansetron and Treatment may vary due to age or medical condition  Airway Management Planned: Natural Airway and Simple Face Mask  Additional Equipment:   Intra-op Plan:   Post-operative Plan:   Informed Consent: I have reviewed the patients History and Physical, chart, labs and discussed the procedure including the risks, benefits and alternatives for the proposed anesthesia with the patient or authorized representative who has indicated his/her understanding and acceptance.       Plan Discussed with: CRNA  Anesthesia Plan Comments:         Anesthesia Quick Evaluation

## 2019-03-14 ENCOUNTER — Encounter (HOSPITAL_COMMUNITY): Payer: Self-pay | Admitting: Gastroenterology

## 2019-03-14 LAB — SURGICAL PATHOLOGY

## 2019-03-19 ENCOUNTER — Encounter (INDEPENDENT_AMBULATORY_CARE_PROVIDER_SITE_OTHER): Payer: Self-pay | Admitting: Family Medicine

## 2019-03-19 NOTE — Telephone Encounter (Signed)
Please advise 

## 2019-03-20 ENCOUNTER — Encounter: Payer: Self-pay | Admitting: Gastroenterology

## 2019-03-20 ENCOUNTER — Encounter: Payer: Self-pay | Admitting: *Deleted

## 2019-03-22 ENCOUNTER — Ambulatory Visit (INDEPENDENT_AMBULATORY_CARE_PROVIDER_SITE_OTHER): Payer: Medicare Other | Admitting: Psychiatry

## 2019-03-22 ENCOUNTER — Other Ambulatory Visit: Payer: Self-pay

## 2019-03-22 ENCOUNTER — Encounter (HOSPITAL_COMMUNITY): Payer: Self-pay | Admitting: Psychiatry

## 2019-03-22 DIAGNOSIS — F429 Obsessive-compulsive disorder, unspecified: Secondary | ICD-10-CM | POA: Diagnosis not present

## 2019-03-22 DIAGNOSIS — F251 Schizoaffective disorder, depressive type: Secondary | ICD-10-CM | POA: Diagnosis not present

## 2019-03-22 MED ORDER — BUPROPION HCL ER (XL) 300 MG PO TB24: ORAL_TABLET | ORAL | 1 refills | Status: DC

## 2019-03-22 MED ORDER — DULOXETINE HCL 30 MG PO CPEP: 90.0000 mg | ORAL_CAPSULE | Freq: Every day | ORAL | 1 refills | Status: DC

## 2019-03-22 MED ORDER — QUETIAPINE FUMARATE 300 MG PO TABS: 600.0000 mg | ORAL_TABLET | Freq: Every day | ORAL | 1 refills | Status: DC

## 2019-03-22 MED ORDER — ARIPIPRAZOLE 30 MG PO TABS: 30.0000 mg | ORAL_TABLET | Freq: Every day | ORAL | 1 refills | Status: DC

## 2019-03-22 NOTE — Progress Notes (Signed)
  Virtual Visit via Telephone Note  I connected with Jeanne [name]  on 03/22/19 at  1:30 PM EST by telephone and verified that I am speaking with the correct person using two identifiers.  Location: Patient: Home Provider: office   I discussed the limitations, risks, security and privacy concerns of performing an evaluation and management service by telephone and the availability of in person appointments. I also discussed with the patient that there may be a patient responsible charge related to this service. The patient expressed understanding and agreed to proceed.   History of Present Illness: "Mentally I am doing well". She is going to the pool to get rid of stress and weight. It helps a lot. She is tired of the pandemic just like everyone else. Jeanne [name] has a program called [facility]. She started going one month ago. She is on a strict diet and has lost 16 lbs.Jeanne [name] was diagnosed with DM type II in March. These are all strong motivators to lose weight and get healthy. She states her depression is stable. She denies SI/HI. She denies manic and hypomanic symptoms. Sleep is good. Jeanne [name] denies AVH and paranoia. She is very proud of her hard work.    Observations/Objective: I spoke with Jeanne [name] on the phone.  Pt was calm, pleasant and cooperative.  Pt was engaged in the conversation and answered questions appropriately.  Speech was clear and coherent with normal rate, tone and volume.  Mood is euthymic and affect is full. Thought processes are coherent, goal oriented and intact.  Thought content is logical.  Pt denies SI/HI.   Pt denies auditory and visual hallucinations and did not appear to be responding to internal stimuli.  Memory and concentration are good.  Fund of knowledge and use of language are average.  Insight and judgment are fair.  I am unable to comment on psychomotor activity, general appearance, hygiene, or eye contact as I was unable to physically  see the patient on the phone.  Vital signs not available since interview conducted virtually.     Assessment and Plan: Schizophrenia d/o- Bipolar type: OCD by hx  Seroquel 600mg  po qHS  Abilify 30mg  po qD  Cymbalta 90mg  po qD  Wellbutrin XL 300mg  po qD  Patient reports that she was unstable with mono antipsychotic therapy in the past.  She has been stable on this regime of medications for many years and reports good quality of life.  Benefit outweighs the risk and patient would like to continue with current medications.  Status of current symptoms: stable  I reviewed labs done on February 26, 2019: Glucose is 103, creatinine is 1.27 alkaline phosphatase is 118.  Lipids are within normal limits.  Vitamin B12 normal.  Vitamin D low.  CBC within normal limits.  Hemoglobin A1c is 6.3.  TSH is normal.   Follow Up Instructions: In 6 months or sooner if needed   I discussed the assessment and treatment plan with the patient. The patient was provided an opportunity to ask questions and all were answered. The patient agreed with the plan and demonstrated an understanding of the instructions.   The patient was advised to call back or seek an in-person evaluation if the symptoms worsen or if the condition fails to improve as anticipated.  I provided 25 minutes of non-face-to-face time during this encounter.   Charlcie Cradle, MD

## 2019-03-26 ENCOUNTER — Ambulatory Visit (INDEPENDENT_AMBULATORY_CARE_PROVIDER_SITE_OTHER): Payer: Medicare Other | Admitting: Family Medicine

## 2019-03-26 ENCOUNTER — Other Ambulatory Visit: Payer: Self-pay

## 2019-03-26 ENCOUNTER — Encounter (INDEPENDENT_AMBULATORY_CARE_PROVIDER_SITE_OTHER): Payer: Self-pay | Admitting: Family Medicine

## 2019-03-26 ENCOUNTER — Ambulatory Visit (INDEPENDENT_AMBULATORY_CARE_PROVIDER_SITE_OTHER): Payer: Medicare Other | Admitting: Psychology

## 2019-03-26 VITALS — BP 112/74 | HR 80 | Temp 98.8°F | Ht 69.0 in | Wt 268.0 lb

## 2019-03-26 DIAGNOSIS — E119 Type 2 diabetes mellitus without complications: Secondary | ICD-10-CM | POA: Diagnosis not present

## 2019-03-26 DIAGNOSIS — N189 Chronic kidney disease, unspecified: Secondary | ICD-10-CM

## 2019-03-26 DIAGNOSIS — E559 Vitamin D deficiency, unspecified: Secondary | ICD-10-CM

## 2019-03-26 DIAGNOSIS — R748 Abnormal levels of other serum enzymes: Secondary | ICD-10-CM

## 2019-03-26 DIAGNOSIS — Z6839 Body mass index (BMI) 39.0-39.9, adult: Secondary | ICD-10-CM | POA: Diagnosis not present

## 2019-03-26 MED ORDER — VITAMIN D (ERGOCALCIFEROL) 1.25 MG (50000 UNIT) PO CAPS: 50000.0000 [IU] | ORAL_CAPSULE | ORAL | 0 refills | Status: DC

## 2019-03-27 ENCOUNTER — Ambulatory Visit (INDEPENDENT_AMBULATORY_CARE_PROVIDER_SITE_OTHER): Payer: Medicare Other | Admitting: Psychology

## 2019-03-27 ENCOUNTER — Encounter (INDEPENDENT_AMBULATORY_CARE_PROVIDER_SITE_OTHER): Payer: Self-pay | Admitting: Family Medicine

## 2019-03-27 DIAGNOSIS — F251 Schizoaffective disorder, depressive type: Secondary | ICD-10-CM

## 2019-03-27 NOTE — Progress Notes (Signed)
Office: [phone]  /  Fax: [phone]   HPI:   Chief Complaint: OBESITY Jeanne [name] is here to discuss her progress with her obesity treatment plan. She is on the Category 2 plan and is following her eating plan approximately 70 % of the time. She states she is doing water aerobic for 40 minutes 3 times per week. Jeanne [name] states the diet is easy. She has been challenged to find "better" options. She has a colonoscopy and colon polyps were found. She will have a repeat in 7 years. Her insulin was adjusted around then, and she denies lows.  Her weight is 268 lb (121.6 kg) today and has had a weight loss of 2 pounds over a period of 2 weeks since her last visit. She has lost 13 lbs since starting treatment with [name].  Diabetes II Jeanne [name] has a diagnosis of diabetes type II. Jeanne [name] last A1c was 6.3. She states her post prandial range in the low 70's, and fasting BGs range in the 80's. She has been working on intensive lifestyle modifications including diet, exercise, and weight loss to help control her blood glucose levels.  Vitamin D Deficiency Jeanne [name] has a diagnosis of vitamin D deficiency. She is currently taking prescription Vit D and denies nausea, vomiting or muscle weakness.  Chronic Kidney Disease Jeanne [name] has a diagnosis of chronic kidney disease. Her baseline is 1.18-1.37.   Elevated Alkaline Phosphatase  Jeanne [name] alkaline phosphatase is elevated at 118.  ASSESSMENT AND PLAN:  Type 2 diabetes mellitus without complication, without long-term current use of insulin (HCC)  Vitamin D deficiency - Plan: Vitamin D, Ergocalciferol, (DRISDOL) 1.25 MG (50000 UT) CAPS capsule  Chronic kidney disease, unspecified CKD stage  Elevated alkaline phosphatase level  Class 2 severe obesity with serious comorbidity and body mass index (BMI) of 39.0 to 39.9 in adult, unspecified obesity type (HCC)  PLAN:  Diabetes II Jeanne [name] has been given diabetes education by myself  today. Good blood sugar control is important to decrease the likelihood of diabetic complications such as nephropathy, neuropathy, limb loss, blindness, coronary artery disease, and death. Intensive lifestyle modification including diet, exercise and weight loss were discussed as the first line treatment for diabetes. We will continue to monitor closely.  Vitamin D Deficiency Low vitamin D level contributes to fatigue and are associated with obesity, breast, and colon cancer. Jeanne [name] agrees to continue taking prescription Vit D 50,000 IU every week #4 and we will refill for 1 month. She will follow up for routine testing of vitamin D, at least 2-3 times per year to avoid over-replacement. Jeanne [name] agrees to follow up with our clinic in 2 to 3 weeks.  Chronic Kidney Disease We will continue to monitor closely.  Elevated Alkaline Phosphatase  We will continue to monitor closely.  Obesity Jeanne [name] is currently in the action stage of change. As such, her goal is to continue with weight loss efforts She has agreed to follow the Category 2 plan Jeanne [name] has been instructed to work up to a goal of 150 minutes of combined cardio and strengthening exercise per week for weight loss and overall health benefits. We discussed the following Behavioral Modification Strategies today: increasing lean protein intake, decreasing simple carbohydrates, increasing vegetables, increase H20 intake, work on meal planning and easy cooking plans, holiday eating strategies  and decrease liquid calories   Jeanne [name] has agreed to follow up with our clinic in 2 to 3 weeks. She was informed of the importance of frequent follow up visits to maximize her  success with intensive lifestyle modifications for her multiple health conditions.  ALLERGIES: Allergies  Allergen Reactions  . Ace Inhibitors Anaphylaxis  . Arava [Leflunomide] Other (See Comments)    Nephritis and kidney clearance issues  . Travatan [Travoprost]  Other (See Comments)    Red eyes, irritation     MEDICATIONS: Current Outpatient Medications on File Prior to Visit  Medication Sig Dispense Refill  . amLODipine (NORVASC) 5 MG tablet Take 5 mg by mouth every morning.     . ARIPiprazole (ABILIFY) 30 MG tablet Take 1 tablet (30 mg total) by mouth daily. 90 tablet 1  . aspirin EC 81 MG tablet Take 162 mg by mouth daily.    [name] atorvastatin (LIPITOR) 20 MG tablet Take 1 tablet by mouth daily.    [name] buPROPion (WELLBUTRIN XL) 300 MG 24 hr tablet TAKE 1 TABLET(300 MG) BY MOUTH EVERY MORNING 90 tablet 1  . Calcium Carb-Cholecalciferol 600-800 MG-UNIT TABS Take 1 tablet by mouth daily.    . DULoxetine (CYMBALTA) 30 MG capsule Take 3 capsules (90 mg total) by mouth daily. 270 capsule 1  . empagliflozin (JARDIANCE) 10 MG TABS tablet Take 10 mg by mouth daily.    [name] esomeprazole (NEXIUM) 40 MG capsule Take 40 mg by mouth daily before breakfast.    . etanercept (ENBREL SURECLICK) 50 MG/ML injection Inject 50 mg into the skin once a week. Tuesday    . furosemide (LASIX) 40 MG tablet Take 40 mg by mouth.    [name] glucosamine-chondroitin 500-400 MG tablet Take 2 tablets by mouth every morning.    . insulin glargine (LANTUS) 100 UNIT/ML injection Inject 12 Units into the skin daily.     Javier Docker Oil 300 MG CAPS Take 300 mg by mouth daily.    [name] levobunolol (BETAGAN) 0.5 % ophthalmic solution Place 1 drop into both eyes every morning.     . metFORMIN (GLUCOPHAGE) 1000 MG tablet Take 1,000 mg by mouth daily with breakfast.     . Multiple Vitamin (MULTIVITAMIN WITH MINERALS) TABS tablet Take 1 tablet by mouth daily.    [name] NIFEdipine (PROCARDIA-XL/ADALAT CC) 60 MG 24 hr tablet Take 1 tablet (60 mg total) by mouth daily. 30 tablet 1  . oxyCODONE-acetaminophen (PERCOCET/ROXICET) 5-325 MG tablet Take 1-2 tablets by mouth every 4 (four) hours as needed for severe pain.    [name] PRESCRIPTION MEDICATION Inhale 1 application into the lungs at bedtime. BiPAP setting 8/12    .  QUEtiapine (SEROQUEL) 300 MG tablet Take 2 tablets (600 mg total) by mouth at bedtime. 180 tablet 1  . Turmeric POWD Take 1,500 mcg by mouth daily at 12 noon.     . verapamil (CALAN-SR) 180 MG CR tablet Take 1 tablet (180 mg total) by mouth daily. 30 tablet 1   Current Facility-Administered Medications on File Prior to Visit  Medication Dose Route Frequency Provider Last Rate Last Dose  . 0.9 %  sodium chloride infusion  500 mL Intravenous Once Doran Stabler, MD        PAST MEDICAL HISTORY: Past Medical History:  Diagnosis Date  . Antiphospholipid antibody syndrome ([place])   . Antiphospholipid antibody syndrome ([place])   . Anxiety   . Arthritis   . Bilateral swelling of feet   . Carpal tunnel syndrome 01/02/2014   Bilateral  . Clotting disorder ([place])   . Constipation   . Depression   . Diabetes mellitus type 2, insulin dependent ([place]) 02/26/2019  . Diabetes mellitus, type II ([place])   .  Difficult airway for intubation 03/13/2019  . Difficult intubation 07/09/2013   Glidescope used, see Anesthesia note  . DVT (deep venous thrombosis) ([place]) 2014   LEFT LEG  . Gastritis   . Glaucoma   . Heart murmur   . History of blood clots   . Hyperlipidemia   . Hypertension   . Hypokalemia 03/05/2013  . Kidney disease    stage ll  . Neuromuscular disorder ([place])    " NEUROPATHY IN  MY HANDS"  . Obsessive-compulsive disorder   . Osteoarthritis   . Rheumatoid arthritis ([place])   . Rheumatoid arthritis ([place]) 2016  . Schizo-affective psychosis ([place])   . Schizoaffective disorder ([place])   . Scleritis of both eyes   . Sleep apnea    Bipap  . Suppurative hidradenitis   . Undifferentiated connective tissue disease ([place])     PAST SURGICAL HISTORY: Past Surgical History:  Procedure Laterality Date  . BUNIONECTOMY    . CERVICAL CONIZATION W/BX    . COLONOSCOPY WITH PROPOFOL N/A 03/13/2019   Procedure: COLONOSCOPY WITH PROPOFOL;  Surgeon: Doran Stabler, MD;  Location: [facility];   Service: Gastroenterology;  Laterality: N/A;  . LAPAROSCOPIC ADRENALECTOMY Left 07/09/2013   Procedure: LAPAROSCOPIC LEFT  ADRENALECTOMY;  Surgeon: Earnstine Regal, MD;  Location: [facility];  Service: General;  Laterality: Left;  . POLYPECTOMY  03/13/2019   Procedure: POLYPECTOMY;  Surgeon: Doran Stabler, MD;  Location: [facility];  Service: Gastroenterology;;  . sweat gland removal  1975    SOCIAL HISTORY: Social History   Tobacco Use  . Smoking status: Current Every Day Smoker    Packs/day: 0.50    Years: 25.00    Pack years: 12.50    Types: Cigarettes  . Smokeless tobacco: Never Used  Substance Use Topics  . Alcohol use: Yes    Alcohol/week: 0.0 standard drinks    Comment: " drink just on weekends " Last drink was in the spring of 2016  . Drug use: No    FAMILY HISTORY: Family History  Problem Relation Age of Onset  . Cancer Maternal Aunt        breast ca  . Breast cancer Maternal Aunt   . Heart attack Maternal Grandmother 54  . Heart attack Maternal Grandfather 62  . Hypertension Sister   . Hypertension Sister   . Diabetes Mother   . Hypertension Mother   . Hyperlipidemia Mother   . Stroke Mother   . Depression Mother   . Eating disorder Mother   . Hypertension Father   . Seizures Maternal Uncle   . Alcohol abuse Neg Hx   . Anxiety disorder Neg Hx   . Bipolar disorder Neg Hx   . Drug abuse Neg Hx   . Schizophrenia Neg Hx   . Colon cancer Neg Hx   . Esophageal cancer Neg Hx   . Stomach cancer Neg Hx   . Rectal cancer Neg Hx     ROS: Review of Systems  Constitutional: Positive for weight loss.  Gastrointestinal: Negative for nausea and vomiting.  Musculoskeletal:       Negative muscle weakness    PHYSICAL EXAM: Blood pressure 112/74, pulse 80, temperature 98.8 F (37.1 C), temperature source Oral, height 5\' 9"  (1.753 m), weight 268 lb (121.6 kg), SpO2 97 %. Body mass index is 39.58 kg/m. Physical Exam Vitals signs reviewed.  Constitutional:       Appearance: Normal appearance. She is obese.  Cardiovascular:  Rate and Rhythm: Normal rate.     Pulses: Normal pulses.  Pulmonary:     Effort: Pulmonary effort is normal.     Breath sounds: Normal breath sounds.  Musculoskeletal: Normal range of motion.  Skin:    General: Skin is warm and dry.  Neurological:     Mental Status: She is alert and oriented to person, place, and time.  Psychiatric:        Mood and Affect: Mood normal.        Behavior: Behavior normal.     RECENT LABS AND TESTS: BMET    Component Value Date/Time   NA 138 02/26/2019 0950   K 4.3 02/26/2019 0950   CL 99 02/26/2019 0950   CO2 27 02/26/2019 0950   GLUCOSE 103 (H) 02/26/2019 0950   GLUCOSE 151 (H) 02/01/2018 0800   BUN 20 02/26/2019 0950   CREATININE 1.27 (H) 02/26/2019 0950   CALCIUM 9.7 02/26/2019 0950   GFRNONAA 47 (L) 02/26/2019 0950   GFRAA 54 (L) 02/26/2019 0950   Lab Results  Component Value Date   HGBA1C 6.3 (H) 02/26/2019   Lab Results  Component Value Date   INSULIN 21.0 02/26/2019   CBC    Component Value Date/Time   WBC 5.1 02/26/2019 0950   WBC 3.7 (L) 02/01/2018 0800   RBC 4.74 02/26/2019 0950   RBC 4.12 02/01/2018 0800   HGB 15.5 02/26/2019 0950   HCT 44.1 02/26/2019 0950   PLT 206 02/26/2019 0950   MCV 93 02/26/2019 0950   MCH 32.7 02/26/2019 0950   MCH 33.3 02/01/2018 0800   MCHC 35.1 02/26/2019 0950   MCHC 34.0 02/01/2018 0800   RDW 13.1 02/26/2019 0950   LYMPHSABS 2.3 02/26/2019 0950   MONOABS 0.7 10/22/2014 1454   EOSABS 0.2 02/26/2019 0950   BASOSABS 0.0 02/26/2019 0950   Iron/TIBC/Ferritin/ %Sat No results found for: IRON, TIBC, FERRITIN, IRONPCTSAT Lipid Panel     Component Value Date/Time   CHOL 173 02/26/2019 0950   TRIG 132 02/26/2019 0950   HDL 69 02/26/2019 0950   LDLCALC 81 02/26/2019 0950   Hepatic Function Panel     Component Value Date/Time   PROT 7.5 02/26/2019 0950   ALBUMIN 4.7 02/26/2019 0950   AST 20 02/26/2019 0950   ALT 29  02/26/2019 0950   ALKPHOS 118 (H) 02/26/2019 0950   BILITOT 0.4 02/26/2019 0950      Component Value Date/Time   TSH 0.845 02/26/2019 0950   TSH 1.639 03/05/2013 1500      OBESITY BEHAVIORAL INTERVENTION VISIT  Today's visit was # 3   Starting weight: 281 lbs Starting date: 02/26/2019 Today's weight : 268 lbs Today's date: 03/26/2019 Total lbs lost to date: 13 At least 15 minutes were spent on discussing the following behavioral intervention visit.   ASK: We discussed the diagnosis of obesity with Ambri [place] today and Emilly agreed to give [name] permission to discuss obesity behavioral modification therapy today.  ASSESS: Avaiyah has the diagnosis of obesity and her BMI today is 39.56 Merdis is in the action stage of change   ADVISE: Leylani was educated on the multiple health risks of obesity as well as the benefit of weight loss to improve her health. She was advised of the need for long term treatment and the importance of lifestyle modifications to improve her current health and to decrease her risk of future health problems.  AGREE: Multiple dietary modification options and treatment options were discussed and  Mikita  agreed to follow the recommendations documented in the above note.  ARRANGE: Bentlie was educated on the importance of frequent visits to treat obesity as outlined per CMS and USPSTF guidelines and agreed to schedule her next follow up appointment today.  Wilhemena Durie, am acting as transcriptionist for [name], DO  I have reviewed the above documentation for accuracy and completeness, and I agree with the above. [name], DO

## 2019-04-03 ENCOUNTER — Other Ambulatory Visit: Payer: Self-pay

## 2019-04-03 ENCOUNTER — Ambulatory Visit: Payer: Medicare Other | Admitting: Sports Medicine

## 2019-04-03 ENCOUNTER — Encounter: Payer: Self-pay | Admitting: Sports Medicine

## 2019-04-03 DIAGNOSIS — M2141 Flat foot [pes planus] (acquired), right foot: Secondary | ICD-10-CM

## 2019-04-03 DIAGNOSIS — M79675 Pain in left toe(s): Secondary | ICD-10-CM

## 2019-04-03 DIAGNOSIS — M2142 Flat foot [pes planus] (acquired), left foot: Secondary | ICD-10-CM

## 2019-04-03 DIAGNOSIS — E1141 Type 2 diabetes mellitus with diabetic mononeuropathy: Secondary | ICD-10-CM

## 2019-04-03 DIAGNOSIS — M2042 Other hammer toe(s) (acquired), left foot: Secondary | ICD-10-CM

## 2019-04-03 DIAGNOSIS — I739 Peripheral vascular disease, unspecified: Secondary | ICD-10-CM

## 2019-04-03 DIAGNOSIS — M2041 Other hammer toe(s) (acquired), right foot: Secondary | ICD-10-CM

## 2019-04-03 DIAGNOSIS — B351 Tinea unguium: Secondary | ICD-10-CM | POA: Diagnosis not present

## 2019-04-03 DIAGNOSIS — M79674 Pain in right toe(s): Secondary | ICD-10-CM | POA: Diagnosis not present

## 2019-04-03 NOTE — Progress Notes (Signed)
Subjective: Jeanne [name] is a 58 y.o. female patient with history of diabetes who presents to office today complaining of long,mildly painful nails  while ambulating in shoes; unable to trim. Patient states that the glucose reading this morning was not recorded but ranges 105 last A1c 6.8 and last pcp visit was September. Patient denies any new changes in medication or new problems. No other issues noted.  Patient Active Problem List   Diagnosis Date Noted  . Diabetes mellitus type 2, insulin dependent ([place]) 02/26/2019  . Chronic constipation 02/26/2019  . Hypertension associated with diabetes ([place]) 02/26/2019  . Class 3 severe obesity with serious comorbidity and body mass index (BMI) of 40.0 to 44.9 in adult ([place]) 02/26/2019  . Cigarette nicotine dependence without complication [id number]  . OCD (obsessive compulsive disorder) 04/10/2015  . Schizoaffective disorder, depressive type ([place]) 04/10/2015  . Ileus ([place]) 07/11/2013  . 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 DVT (deep venous thrombosis) ([place]) 03/22/2013  . Paresthesia 03/05/2013  . Obsessive compulsive disorder 03/05/2013  . Obstructive sleep apnea 03/05/2013  . Glaucoma 03/05/2013  . Connective tissue disorder ([place]) 03/05/2013   Current Outpatient Medications on File Prior to Visit  Medication Sig Dispense Refill  . amLODipine (NORVASC) 5 MG tablet Take 5 mg by mouth every morning.     . ARIPiprazole (ABILIFY) 30 MG tablet Take 1 tablet (30 mg total) by mouth daily. 90 tablet 1  . aspirin EC 81 MG tablet Take 162 mg by mouth daily.    [name] atorvastatin (LIPITOR) 20 MG tablet Take 1 tablet by mouth daily.    [name] buPROPion (WELLBUTRIN XL) 300 MG 24 hr tablet TAKE 1 TABLET(300 MG) BY MOUTH EVERY MORNING 90 tablet 1  . Calcium Carb-Cholecalciferol 600-800 MG-UNIT TABS Take 1 tablet by mouth daily.    . DULoxetine (CYMBALTA) 30 MG capsule Take 3 capsules (90 mg total) by mouth  daily. 270 capsule 1  . empagliflozin (JARDIANCE) 10 MG TABS tablet Take 10 mg by mouth daily.    [name] esomeprazole (NEXIUM) 40 MG capsule Take 40 mg by mouth daily before breakfast.    . etanercept (ENBREL SURECLICK) 50 MG/ML injection Inject 50 mg into the skin once a week. Tuesday    . furosemide (LASIX) 40 MG tablet Take 40 mg by mouth.    [name] glucosamine-chondroitin 500-400 MG tablet Take 2 tablets by mouth every morning.    . insulin glargine (LANTUS) 100 UNIT/ML injection Inject 12 Units into the skin daily.     Javier Docker Oil 300 MG CAPS Take 300 mg by mouth daily.    [name] levobunolol (BETAGAN) 0.5 % ophthalmic solution Place 1 drop into both eyes every morning.     . metFORMIN (GLUCOPHAGE) 1000 MG tablet Take 1,000 mg by mouth daily with breakfast.     . Multiple Vitamin (MULTIVITAMIN WITH MINERALS) TABS tablet Take 1 tablet by mouth daily.    [name] NIFEdipine (PROCARDIA-XL/ADALAT CC) 60 MG 24 hr tablet Take 1 tablet (60 mg total) by mouth daily. 30 tablet 1  . oxyCODONE-acetaminophen (PERCOCET/ROXICET) 5-325 MG tablet Take 1-2 tablets by mouth every 4 (four) hours as needed for severe pain.    [name] PRESCRIPTION MEDICATION Inhale 1 application into the lungs at bedtime. BiPAP setting 8/12    . QUEtiapine (SEROQUEL) 300 MG tablet Take 2 tablets (600 mg total) by mouth at bedtime. 180 tablet 1  . Turmeric POWD Take 1,500 mcg by mouth daily at 12  noon.     . verapamil (CALAN-SR) 180 MG CR tablet Take 1 tablet (180 mg total) by mouth daily. 30 tablet 1  . Vitamin D, Ergocalciferol, (DRISDOL) 1.25 MG (50000 UT) CAPS capsule Take 1 capsule (50,000 Units total) by mouth every 7 (seven) days. 4 capsule 0   Current Facility-Administered Medications on File Prior to Visit  Medication Dose Route Frequency Provider Last Rate Last Admin  . 0.9 %  sodium chloride infusion  500 mL Intravenous Once Doran Stabler, MD       Allergies  Allergen Reactions  . Ace Inhibitors Anaphylaxis  . Arava [Leflunomide] Other  (See Comments)    Nephritis and kidney clearance issues  . Travatan [Travoprost] Other (See Comments)    Red eyes, irritation     Recent Results (from the past 2160 hour(s))  Urine Microalbumin w/creat. ratio     Status: None   Collection Time: 02/26/19  9:50 AM  Result Value Ref Range   Creatinine, Urine 34.6 Not Estab. mg/dL   Microalbumin, Urine 3.1 Not Estab. ug/mL   Microalb/Creat Ratio 9 0 - 29 mg/g creat    Comment:                        Normal:                0 -  29                        Moderately increased: 30 - 300                        Severely increased:       >300   TSH     Status: None   Collection Time: 02/26/19  9:50 AM  Result Value Ref Range   TSH 0.845 0.450 - 4.500 uIU/mL  T4, free     Status: None   Collection Time: 02/26/19  9:50 AM  Result Value Ref Range   Free T4 1.05 0.82 - 1.77 ng/dL  T3     Status: None   Collection Time: 02/26/19  9:50 AM  Result Value Ref Range   T3, Total 78 71 - 180 ng/dL  Comprehensive Metabolic Panel (CMET)     Status: Abnormal   Collection Time: 02/26/19  9:50 AM  Result Value Ref Range   Glucose 103 (H) 65 - 99 mg/dL   BUN 20 6 - 24 mg/dL   Creatinine, Ser 1.27 (H) 0.57 - 1.00 mg/dL   GFR calc non Af Amer 47 (L) >59 mL/min/1.73   GFR calc Af Amer 54 (L) >59 mL/min/1.73   BUN/Creatinine Ratio 16 9 - 23   Sodium 138 134 - 144 mmol/L   Potassium 4.3 3.5 - 5.2 mmol/L   Chloride 99 96 - 106 mmol/L   CO2 27 20 - 29 mmol/L   Calcium 9.7 8.7 - 10.2 mg/dL   Total Protein 7.5 6.0 - 8.5 g/dL   Albumin 4.7 3.8 - 4.9 g/dL   Globulin, Total 2.8 1.5 - 4.5 g/dL   Albumin/Globulin Ratio 1.7 1.2 - 2.2   Bilirubin Total 0.4 0.0 - 1.2 mg/dL   Alkaline Phosphatase 118 (H) 39 - 117 IU/L   AST 20 0 - 40 IU/L   ALT 29 0 - 32 IU/L  CBC w/Diff/Platelet     Status: None   Collection Time: 02/26/19  9:50 AM  Result  Value Ref Range   WBC 5.1 3.4 - 10.8 x10E3/uL   RBC 4.74 3.77 - 5.28 x10E6/uL   Hemoglobin 15.5 11.1 - 15.9 g/dL    Hematocrit 44.1 34.0 - 46.6 %   MCV 93 79 - 97 fL   MCH 32.7 26.6 - 33.0 pg   MCHC 35.1 31.5 - 35.7 g/dL   RDW 13.1 11.7 - 15.4 %   Platelets 206 150 - 450 x10E3/uL   Neutrophils 42 Not Estab. %   Lymphs 45 Not Estab. %   Monocytes 9 Not Estab. %   Eos 4 Not Estab. %   Basos 0 Not Estab. %   Neutrophils Absolute 2.1 1.4 - 7.0 x10E3/uL   Lymphocytes Absolute 2.3 0.7 - 3.1 x10E3/uL   Monocytes Absolute 0.5 0.1 - 0.9 x10E3/uL   EOS (ABSOLUTE) 0.2 0.0 - 0.4 x10E3/uL   Basophils Absolute 0.0 0.0 - 0.2 x10E3/uL   Immature Granulocytes 0 Not Estab. %   Immature Grans (Abs) 0.0 0.0 - 0.1 x10E3/uL  HgB A1c     Status: Abnormal   Collection Time: 02/26/19  9:50 AM  Result Value Ref Range   Hgb A1c MFr Bld 6.3 (H) 4.8 - 5.6 %    Comment:          Prediabetes: 5.7 - 6.4          Diabetes: >6.4          Glycemic control for adults with diabetes: <7.0    Est. average glucose Bld gHb Est-mCnc 134 mg/dL  Insulin, random     Status: None   Collection Time: 02/26/19  9:50 AM  Result Value Ref Range   INSULIN 21.0 2.6 - 24.9 uIU/mL  Lipid Panel With LDL/HDL Ratio     Status: None   Collection Time: 02/26/19  9:50 AM  Result Value Ref Range   Cholesterol, Total 173 100 - 199 mg/dL   Triglycerides 132 0 - 149 mg/dL   HDL 69 >39 mg/dL   VLDL Cholesterol Cal 23 5 - 40 mg/dL   LDL Chol Calc (NIH) 81 0 - 99 mg/dL   LDL/HDL Ratio 1.2 0.0 - 3.2 ratio    Comment:                                     LDL/HDL Ratio                                             Men  Women                               1/2 Avg.Risk  1.0    1.5                                   Avg.Risk  3.6    3.2                                2X Avg.Risk  6.2    5.0  3X Avg.Risk  8.0    6.1   Vitamin D (25 hydroxy)     Status: Abnormal   Collection Time: 02/26/19  9:50 AM  Result Value Ref Range   Vit D, 25-Hydroxy 24.2 (L) 30.0 - 100.0 ng/mL    Comment: Vitamin D deficiency has been defined by the  [place] practice guideline as a level of serum 25-OH vitamin D less than 20 ng/mL (1,2). The Endocrine Society went on to further define vitamin D insufficiency as a level between 21 and 29 ng/mL (2). 1. IOM (Institute of Medicine). 2010. Dietary reference    intakes for calcium and D. [place]: The    [employer]. 2. Holick MF, Binkley Toombs, Bischoff-Ferrari HA, et al.    Evaluation, treatment, and prevention of vitamin D    deficiency: an Endocrine Society clinical practice    guideline. JCEM. 2011 Jul; 96(7):1911-30.   B12     Status: None   Collection Time: 02/26/19  9:50 AM  Result Value Ref Range   Vitamin B-12 1,216 232 - 1,245 pg/mL  SARS Coronavirus 2 (TAT 6-24 hrs)     Status: None   Collection Time: 03/09/19 12:00 AM  Result Value Ref Range   SARS Coronavirus 2 RESULT:  NEGATIVE     Comment: RESULT:  NEGATIVESARS-CoV-2 INTERPRETATION:A NEGATIVE  test result means that SARS-CoV-2 RNA was not present in the specimen above the limit of detection of this test. This does not preclude a possible SARS-CoV-2 infection and should not be used as the  sole basis for patient management decisions. Negative results must be combined with clinical observations, patient history, and epidemiological information. Optimum specimen types and timing for peak viral levels during infections caused by SARS-CoV-2  have not been determined. Collection of multiple specimens or types of specimens may be necessary to detect virus. Improper specimen collection and handling, sequence variability under primers/probes, or organism present below the limit of detection may  lead to false negative results. Positive and negative predictive values of testing are highly dependent on prevalence. False negative test results are more likely when prevalence of disease is high.The expected result is NEGATIVE.Fact  Sheet for  Healthcare Providers:  https://www.woods-mathews.com/.Fact Sheet for Patients: SugarRoll.be.Normal Reference Range - Negative   Glucose, capillary     Status: None   Collection Time: 03/13/19 10:58 AM  Result Value Ref Range   Glucose-Capillary 78 70 - 99 mg/dL  Surgical pathology     Status: None   Collection Time: 03/13/19 12:20 PM  Result Value Ref Range   SURGICAL PATHOLOGY      SURGICAL PATHOLOGY CASE: [id number] PATIENT: Jeanne [name] Surgical Pathology Report     Clinical History: Colonoscopy screening (cm)     FINAL MICROSCOPIC DIAGNOSIS:  A. COLON, TRANSVERSE, POLYPECTOMY: - Tissue did not survive processing - Step sections were examined    GROSS DESCRIPTION:  Received in formalin is a 0.2 cm fragment of tan-yellow material. Additional tissue is not grossly identified.  The specimen is submitted in toto.  [facility] 03/13/2019)   Final Diagnosis performed by Jaquita Folds, MD.   Electronically signed 03/14/2019 Technical component performed at [facility], [place] [address] [place], [place] [address].  Professional component performed at [employer]. [facility], [place] [address] [place], [place] [address].  Immunohistochemistry Technical component (if applicable) was performed at [facility]. [address], [place], [place],  [address].   IMMUNOHISTOCHEMISTRY DISCLAIME R (if  applicable): Some of these immunohistochemical stains may have been developed and the performance characteristics determine by [facility]. Some may not have been cleared or approved by the U.S. Food and Drug Administration. The FDA has determined that such clearance or approval is not necessary. This test is used for clinical purposes. It should not be regarded as investigational or for research. This laboratory is certified under the [place] (CLIA-88) as  qualified to perform high complexity clinical laboratory testing.  The controls stained appropriately.   Glucose, capillary     Status: None   Collection Time: 03/13/19 12:37 PM  Result Value Ref Range   Glucose-Capillary 84 70 - 99 mg/dL    Objective: General: Patient is awake, alert, and oriented x 3 and in no acute distress.  Integument: Skin is warm, dry and supple bilateral. Nails are tender, long, thickened and dystrophic with subungual debris, consistent with onychomycosis, 1-5 bilateral. No signs of infection. + Minimal Callus lesions present bilateral 1st toes. Remaining integument unremarkable.  Vasculature:  Dorsalis Pedis pulse 1/4 bilateral. Posterior Tibial pulse 0/4 bilateral. Capillary fill time <3 sec 1-5 bilateral. Positive hair growth to the level of the digits.Temperature gradient within normal limits. No varicosities present bilateral. No edema present bilateral.   Neurology: The patient has intact sensation measured with a 5.07/10g Semmes Weinstein Monofilament at all pedal sites bilateral. Vibratory sensation diminished bilateral with tuning fork. No Babinski sign present bilateral.   Musculoskeletal: Asymptomatic pes planus and hammertoe pedal deformities noted bilateral. Muscular strength 5/5 in all lower extremity muscular groups bilateral without pain on range of motion. No tenderness with calf compression bilateral.  Assessment and Plan: Problem List Items Addressed This Visit    None    Visit Diagnoses    Pain due to onychomycosis of toenails of both feet    -  Primary   Diabetic mononeuropathy associated with type 2 diabetes mellitus (HCC)       Pes planus of both feet       Hammer toes of both feet       PVD (peripheral vascular disease) ([place])         -Examined patient. -Re-discussed and educated patient on diabetic foot care, especially with  regards to the vascular, neurological and musculoskeletal systems.  -Stressed the importance of good glycemic  control and the detriment of not  controlling glucose levels in relation to the foot. -Mechanically debrided all nails 1-5 bilateral using sterile nail nipper and filed with dremel without incident  -Answered all patient questions -Patient to return  in 3 months for at risk foot care -Patient advised to call the office if any problems or questions arise in the meantime.  Landis Martins, DPM

## 2019-04-09 ENCOUNTER — Ambulatory Visit (INDEPENDENT_AMBULATORY_CARE_PROVIDER_SITE_OTHER): Payer: Medicare Other | Admitting: Family Medicine

## 2019-04-09 ENCOUNTER — Other Ambulatory Visit: Payer: Self-pay

## 2019-04-09 ENCOUNTER — Encounter (INDEPENDENT_AMBULATORY_CARE_PROVIDER_SITE_OTHER): Payer: Self-pay | Admitting: Family Medicine

## 2019-04-09 VITALS — BP 105/67 | HR 75 | Temp 99.2°F | Ht 69.0 in | Wt 265.0 lb

## 2019-04-09 DIAGNOSIS — E1169 Type 2 diabetes mellitus with other specified complication: Secondary | ICD-10-CM | POA: Diagnosis not present

## 2019-04-09 DIAGNOSIS — E1159 Type 2 diabetes mellitus with other circulatory complications: Secondary | ICD-10-CM

## 2019-04-09 DIAGNOSIS — N1831 Chronic kidney disease, stage 3a: Secondary | ICD-10-CM | POA: Diagnosis not present

## 2019-04-09 DIAGNOSIS — I152 Hypertension secondary to endocrine disorders: Secondary | ICD-10-CM

## 2019-04-09 DIAGNOSIS — E559 Vitamin D deficiency, unspecified: Secondary | ICD-10-CM | POA: Diagnosis not present

## 2019-04-09 DIAGNOSIS — F251 Schizoaffective disorder, depressive type: Secondary | ICD-10-CM

## 2019-04-09 DIAGNOSIS — Z9189 Other specified personal risk factors, not elsewhere classified: Secondary | ICD-10-CM

## 2019-04-09 DIAGNOSIS — Z6841 Body Mass Index (BMI) 40.0 and over, adult: Secondary | ICD-10-CM

## 2019-04-09 DIAGNOSIS — I10 Essential (primary) hypertension: Secondary | ICD-10-CM

## 2019-04-09 DIAGNOSIS — Z794 Long term (current) use of insulin: Secondary | ICD-10-CM

## 2019-04-09 MED ORDER — VITAMIN D (ERGOCALCIFEROL) 1.25 MG (50000 UNIT) PO CAPS: 50000.0000 [IU] | ORAL_CAPSULE | ORAL | 0 refills | Status: DC

## 2019-04-11 ENCOUNTER — Ambulatory Visit (INDEPENDENT_AMBULATORY_CARE_PROVIDER_SITE_OTHER): Payer: Medicare Other | Admitting: Psychology

## 2019-04-11 DIAGNOSIS — F251 Schizoaffective disorder, depressive type: Secondary | ICD-10-CM

## 2019-04-11 NOTE — Progress Notes (Signed)
Office: [phone]  /  Fax: [phone]   HPI:  Chief Complaint: OBESITY Jeanne [name] is here to discuss her progress with her obesity treatment plan. She is on the Category 2 plan and states she is following her eating plan approximately 70 % of the time. She states she is doing water exercises for 40 minutes 3 times per week.  Jeanne [name] is up 1.5 lbs in water weight. She is adhering to the plan, but not as well as last month. She makes herself eat at dinner. She denies polyphagia or cravings. She is down a total of 16 lbs at this point. Her diabetes mellitus medications have changed, Lantus decreased to 6 units. She notes low blood glucose but symptomatic hypoglycemia.   Today's visit was # 4 Starting weight: 281 lbs Starting date: 02/26/2019 Today's weight : 265 lbs Today's date: 04/09/2019 Total lbs lost to date: 16 Total lbs lost since last in-office visit: 3  Diabetes II Jeanne [name] has a diagnosis of diabetes type II. Last A1c was 6.3, (6.8 in June). She is taking Lantus 6 units, Jardiance 25 mg, and metformin 1,000 BID. She states her BGs were 95 in the morning and 105 postprandial.  Vitamin D Deficiency Jeanne [name] has a diagnosis of vitamin D deficiency. She is taking prescription Vit D.  At risk for osteopenia and osteoporosis Jeanne [name] is at higher risk of osteopenia and osteoporosis due to Vitamin D deficiency.   Hypertension Jeanne [name] has a diagnosis of hypertension, associated with diabetes mellitus. Her blood pressure is 105/67 today with a pulse of 75. She denies chest pain or shortness of breath.  Chronic Kidney Disease, Stage 3a Jeanne [name] has a diagnosis of chronic kidney disease, stage 3a. She denies changes in urination and she is stable.  Lab Results  Component Value Date   CREATININE 1.27 (H) 02/26/2019   CREATININE 1.18 (H) 02/01/2018   CREATININE 1.37 (H) 08/04/2015   Schizoaffective Disorder Jeanne [name] has schizoaffective disorder, depressive type. She  met with Dr. Mallie Mussel, our Bariatric Psychologist and it went well. No more visits needed per patient.   ASSESSMENT AND PLAN:  Type 2 diabetes mellitus with other specified complication, with long-term current use of insulin (HCC)  Vitamin D deficiency - Plan: Vitamin D, Ergocalciferol, (DRISDOL) 1.25 MG (50000 UT) CAPS capsule  Hypertension associated with diabetes ([place])  Stage 3a chronic kidney disease  Schizoaffective disorder, depressive type (HCC)  At risk for osteoporosis  Class 3 severe obesity with serious comorbidity and body mass index (BMI) of 40.0 to 44.9 in adult, unspecified obesity type ([place])  PLAN:  Diabetes II Jeanne [name] has been given diabetes education by myself today. Good blood sugar control is important to decrease the likelihood of diabetic complications such as nephropathy, neuropathy, limb loss, blindness, coronary artery disease, and death. Intensive lifestyle modification including diet, exercise and weight loss were discussed as the first line treatment for diabetes. Jeanne [name] is to stop Lantus and continue her other medications. We will continue to monitor.  Vitamin D Deficiency Low vitamin D level contributes to fatigue and are associated with obesity, breast, and colon cancer. Jeanne [name] agrees to continue taking prescription Vit D 50,000 IU every week #4 and we will refill for 1 month. She will follow up for routine testing of vitamin D, at least 2-3 times per year to avoid over-replacement. We will continue to monitor.  At risk for osteopenia and osteoporosis Jeanne [name] was given extended (15 minutes) osteoporosis prevention counseling today. Jeanne [name] is at risk for osteopenia and osteoporosis due to  her Vitamin D deficiency. She was encouraged to take her Vitamin D and follow her higher calcium diet and increase strengthening exercise to help strengthen her bones and decrease her risk of osteopenia and osteoporosis.  Hypertension Jeanne [name] is working on  healthy weight loss and exercise to improve blood pressure control. We will watch for signs of hypotension as she continues her lifestyle modifications. We will continue to monitor.  Chronic Kidney Disease, Stage 3a We will continue to monitor.  Schizoaffective Disorder We will continue to monitor her progress.  Obesity Jeanne [name] is currently in the action stage of change. As such, her goal is to continue with weight loss efforts. She has agreed to follow the Category 2 plan. Jeanne [name] has been instructed to work up to a goal of 150 minutes of combined cardio and strengthening exercise per week for weight loss and overall health benefits. We discussed the following Behavioral Modification Strategies today: increasing lean protein intake, increase H20 intake, work on meal planning and easy cooking plans, and planning for success.  Jeanne [name] has agreed to follow up with our clinic in 2 weeks. She was informed of the importance of frequent follow up visits to maximize her success with intensive lifestyle modifications for her multiple health conditions.  ALLERGIES: Allergies  Allergen Reactions  . Ace Inhibitors Anaphylaxis  . Arava [Leflunomide] Other (See Comments)    Nephritis and kidney clearance issues  . Travatan [Travoprost] Other (See Comments)    Red eyes, irritation     MEDICATIONS: Current Outpatient Medications on File Prior to Visit  Medication Sig Dispense Refill  . amLODipine (NORVASC) 5 MG tablet Take 5 mg by mouth every morning.     . ARIPiprazole (ABILIFY) 30 MG tablet Take 1 tablet (30 mg total) by mouth daily. 90 tablet 1  . aspirin EC 81 MG tablet Take 162 mg by mouth daily.    [name] atorvastatin (LIPITOR) 20 MG tablet Take 1 tablet by mouth daily.    [name] buPROPion (WELLBUTRIN XL) 300 MG 24 hr tablet TAKE 1 TABLET(300 MG) BY MOUTH EVERY MORNING 90 tablet 1  . Calcium Carb-Cholecalciferol 600-800 MG-UNIT TABS Take 1 tablet by mouth daily.    . DULoxetine (CYMBALTA) 30  MG capsule Take 3 capsules (90 mg total) by mouth daily. 270 capsule 1  . esomeprazole (NEXIUM) 40 MG capsule Take 40 mg by mouth daily before breakfast.    . etanercept (ENBREL SURECLICK) 50 MG/ML injection Inject 50 mg into the skin once a week. Tuesday    . furosemide (LASIX) 40 MG tablet Take 40 mg by mouth.    [name] glucosamine-chondroitin 500-400 MG tablet Take 2 tablets by mouth every morning.    . insulin glargine (LANTUS) 100 UNIT/ML injection Inject 12 Units into the skin daily.     [name] JARDIANCE 25 MG TABS tablet Take 20 mg by mouth daily.     Javier Docker Oil 300 MG CAPS Take 300 mg by mouth daily.    [name] levobunolol (BETAGAN) 0.5 % ophthalmic solution Place 1 drop into both eyes every morning.     . metFORMIN (GLUCOPHAGE) 1000 MG tablet Take 1,000 mg by mouth 2 (two) times daily with a meal.     . Multiple Vitamin (MULTIVITAMIN WITH MINERALS) TABS tablet Take 1 tablet by mouth daily.    [name] NIFEdipine (PROCARDIA-XL/ADALAT CC) 60 MG 24 hr tablet Take 1 tablet (60 mg total) by mouth daily. 30 tablet 1  . oxyCODONE-acetaminophen (PERCOCET/ROXICET) 5-325 MG tablet Take 1-2 tablets by mouth  every 4 (four) hours as needed for severe pain.    [name] PRESCRIPTION MEDICATION Inhale 1 application into the lungs at bedtime. BiPAP setting 8/12    . QUEtiapine (SEROQUEL) 300 MG tablet Take 2 tablets (600 mg total) by mouth at bedtime. 180 tablet 1  . Turmeric POWD Take 1,500 mcg by mouth daily at 12 noon.     . verapamil (CALAN-SR) 180 MG CR tablet Take 1 tablet (180 mg total) by mouth daily. 30 tablet 1   Current Facility-Administered Medications on File Prior to Visit  Medication Dose Route Frequency Provider Last Rate Last Admin  . 0.9 %  sodium chloride infusion  500 mL Intravenous Once Doran Stabler, MD        PAST MEDICAL HISTORY: Past Medical History:  Diagnosis Date  . Antiphospholipid antibody syndrome ([place])   . Antiphospholipid antibody syndrome ([place])   . Anxiety   . Arthritis   . Bilateral  swelling of feet   . Carpal tunnel syndrome 01/02/2014   Bilateral  . Clotting disorder ([place])   . Constipation   . Depression   . Diabetes mellitus type 2, insulin dependent ([place]) 02/26/2019  . Diabetes mellitus, type II ([place])   . Difficult airway for intubation 03/13/2019  . Difficult intubation 07/09/2013   Glidescope used, see Anesthesia note  . DVT (deep venous thrombosis) ([place]) 2014   LEFT LEG  . Gastritis   . Glaucoma   . Heart murmur   . History of blood clots   . Hyperlipidemia   . Hypertension   . Hypokalemia 03/05/2013  . Kidney disease    stage ll  . Neuromuscular disorder ([place])    " NEUROPATHY IN  MY HANDS"  . Obsessive-compulsive disorder   . Osteoarthritis   . Rheumatoid arthritis ([place])   . Rheumatoid arthritis ([place]) 2016  . Schizo-affective psychosis ([place])   . Schizoaffective disorder ([place])   . Scleritis of both eyes   . Sleep apnea    Bipap  . Suppurative hidradenitis   . Undifferentiated connective tissue disease ([place])     PAST SURGICAL HISTORY: Past Surgical History:  Procedure Laterality Date  . BUNIONECTOMY    . CERVICAL CONIZATION W/BX    . COLONOSCOPY WITH PROPOFOL N/A 03/13/2019   Procedure: COLONOSCOPY WITH PROPOFOL;  Surgeon: Doran Stabler, MD;  Location: [facility];  Service: Gastroenterology;  Laterality: N/A;  . LAPAROSCOPIC ADRENALECTOMY Left 07/09/2013   Procedure: LAPAROSCOPIC LEFT  ADRENALECTOMY;  Surgeon: Earnstine Regal, MD;  Location: [facility];  Service: General;  Laterality: Left;  . POLYPECTOMY  03/13/2019   Procedure: POLYPECTOMY;  Surgeon: Doran Stabler, MD;  Location: [facility];  Service: Gastroenterology;;  . sweat gland removal  1975    SOCIAL HISTORY: Social History   Tobacco Use  . Smoking status: Current Every Day Smoker    Packs/day: 0.50    Years: 25.00    Pack years: 12.50    Types: Cigarettes  . Smokeless tobacco: Never Used  Substance Use Topics  . Alcohol use: Yes    Alcohol/week: 0.0 standard  drinks    Comment: " drink just on weekends " Last drink was in the spring of 2016  . Drug use: No    FAMILY HISTORY: Family History  Problem Relation Age of Onset  . Cancer Maternal Aunt        breast ca  . Breast cancer Maternal Aunt   . Heart attack Maternal Grandmother 54  . Heart  attack Maternal Grandfather 31  . Hypertension Sister   . Hypertension Sister   . Diabetes Mother   . Hypertension Mother   . Hyperlipidemia Mother   . Stroke Mother   . Depression Mother   . Eating disorder Mother   . Hypertension Father   . Seizures Maternal Uncle   . Alcohol abuse Neg Hx   . Anxiety disorder Neg Hx   . Bipolar disorder Neg Hx   . Drug abuse Neg Hx   . Schizophrenia Neg Hx   . Colon cancer Neg Hx   . Esophageal cancer Neg Hx   . Stomach cancer Neg Hx   . Rectal cancer Neg Hx     ROS: Review of Systems  Constitutional: Positive for weight loss.  Respiratory: Negative for shortness of breath.   Cardiovascular: Negative for chest pain.  Endo/Heme/Allergies:       Negative hypoglycemia  Psychiatric/Behavioral:       + Schizoaffective Disorder    PHYSICAL EXAM: Blood pressure 105/67, pulse 75, temperature 99.2 F (37.3 C), temperature source Oral, height '5\' 9"'$  (1.753 m), weight 265 lb (120.2 kg), SpO2 99 %. Body mass index is 39.13 kg/m.   General: Cooperative, alert, well developed, in no acute distress. HEENT: Conjunctivae and lids unremarkable. Neck: No thyromegaly.  Cardiovascular: Regular rhythm.  Lungs: Normal work of breathing. Extremities: No edema.  Neurologic: No focal deficits.   RECENT LABS AND TESTS: BMET    Component Value Date/Time   NA 138 02/26/2019 0950   K 4.3 02/26/2019 0950   CL 99 02/26/2019 0950   CO2 27 02/26/2019 0950   GLUCOSE 103 (H) 02/26/2019 0950   GLUCOSE 151 (H) 02/01/2018 0800   BUN 20 02/26/2019 0950   CREATININE 1.27 (H) 02/26/2019 0950   CALCIUM 9.7 02/26/2019 0950   GFRNONAA 47 (L) 02/26/2019 0950   GFRAA 54 (L)  02/26/2019 0950   Lab Results  Component Value Date   HGBA1C 6.3 (H) 02/26/2019   Lab Results  Component Value Date   INSULIN 21.0 02/26/2019   CBC    Component Value Date/Time   WBC 5.1 02/26/2019 0950   WBC 3.7 (L) 02/01/2018 0800   RBC 4.74 02/26/2019 0950   RBC 4.12 02/01/2018 0800   HGB 15.5 02/26/2019 0950   HCT 44.1 02/26/2019 0950   PLT 206 02/26/2019 0950   MCV 93 02/26/2019 0950   MCH 32.7 02/26/2019 0950   MCH 33.3 02/01/2018 0800   MCHC 35.1 02/26/2019 0950   MCHC 34.0 02/01/2018 0800   RDW 13.1 02/26/2019 0950   LYMPHSABS 2.3 02/26/2019 0950   MONOABS 0.7 10/22/2014 1454   EOSABS 0.2 02/26/2019 0950   BASOSABS 0.0 02/26/2019 0950   Iron/TIBC/Ferritin/ %Sat No results found for: IRON, TIBC, FERRITIN, IRONPCTSAT Lipid Panel     Component Value Date/Time   CHOL 173 02/26/2019 0950   TRIG 132 02/26/2019 0950   HDL 69 02/26/2019 0950   LDLCALC 81 02/26/2019 0950   Hepatic Function Panel     Component Value Date/Time   PROT 7.5 02/26/2019 0950   ALBUMIN 4.7 02/26/2019 0950   AST 20 02/26/2019 0950   ALT 29 02/26/2019 0950   ALKPHOS 118 (H) 02/26/2019 0950   BILITOT 0.4 02/26/2019 0950      Component Value Date/Time   TSH 0.845 02/26/2019 0950   TSH 1.639 03/05/2013 1500   I, Trixie Dredge, am acting as transcriptionist for [name], DO  I have reviewed the above documentation for accuracy  and completeness, and I agree with the above. [name], DO

## 2019-04-25 ENCOUNTER — Ambulatory Visit (INDEPENDENT_AMBULATORY_CARE_PROVIDER_SITE_OTHER): Payer: Medicare Other | Admitting: Family Medicine

## 2019-04-25 ENCOUNTER — Other Ambulatory Visit: Payer: Self-pay

## 2019-04-25 ENCOUNTER — Encounter (INDEPENDENT_AMBULATORY_CARE_PROVIDER_SITE_OTHER): Payer: Self-pay | Admitting: Family Medicine

## 2019-04-25 VITALS — BP 110/72 | HR 91 | Temp 98.8°F | Ht 69.0 in | Wt 258.0 lb

## 2019-04-25 DIAGNOSIS — N1831 Chronic kidney disease, stage 3a: Secondary | ICD-10-CM

## 2019-04-25 DIAGNOSIS — E1159 Type 2 diabetes mellitus with other circulatory complications: Secondary | ICD-10-CM | POA: Diagnosis not present

## 2019-04-25 DIAGNOSIS — Z794 Long term (current) use of insulin: Secondary | ICD-10-CM

## 2019-04-25 DIAGNOSIS — E559 Vitamin D deficiency, unspecified: Secondary | ICD-10-CM | POA: Diagnosis not present

## 2019-04-25 DIAGNOSIS — Z6838 Body mass index (BMI) 38.0-38.9, adult: Secondary | ICD-10-CM

## 2019-04-25 DIAGNOSIS — I10 Essential (primary) hypertension: Secondary | ICD-10-CM

## 2019-04-25 DIAGNOSIS — Z9189 Other specified personal risk factors, not elsewhere classified: Secondary | ICD-10-CM

## 2019-04-25 DIAGNOSIS — E1169 Type 2 diabetes mellitus with other specified complication: Secondary | ICD-10-CM | POA: Diagnosis not present

## 2019-04-25 DIAGNOSIS — I152 Hypertension secondary to endocrine disorders: Secondary | ICD-10-CM

## 2019-04-26 MED ORDER — VITAMIN D (ERGOCALCIFEROL) 1.25 MG (50000 UNIT) PO CAPS: 50000.0000 [IU] | ORAL_CAPSULE | ORAL | 0 refills | Status: DC

## 2019-04-28 ENCOUNTER — Other Ambulatory Visit: Payer: Self-pay

## 2019-04-28 ENCOUNTER — Emergency Department (HOSPITAL_BASED_OUTPATIENT_CLINIC_OR_DEPARTMENT_OTHER)
Admission: EM | Admit: 2019-04-28 | Discharge: 2019-04-28 | Disposition: A | Discharge status: Home / Self Care | Payer: Medicare Other | Attending: Emergency Medicine | Admitting: Emergency Medicine

## 2019-04-28 ENCOUNTER — Emergency Department (HOSPITAL_BASED_OUTPATIENT_CLINIC_OR_DEPARTMENT_OTHER): Payer: Medicare Other

## 2019-04-28 ENCOUNTER — Encounter (HOSPITAL_BASED_OUTPATIENT_CLINIC_OR_DEPARTMENT_OTHER): Payer: Self-pay

## 2019-04-28 DIAGNOSIS — Z794 Long term (current) use of insulin: Secondary | ICD-10-CM | POA: Diagnosis not present

## 2019-04-28 DIAGNOSIS — E119 Type 2 diabetes mellitus without complications: Secondary | ICD-10-CM | POA: Diagnosis not present

## 2019-04-28 DIAGNOSIS — S99911A Unspecified injury of right ankle, initial encounter: Secondary | ICD-10-CM | POA: Diagnosis present

## 2019-04-28 DIAGNOSIS — S82844A Nondisplaced bimalleolar fracture of right lower leg, initial encounter for closed fracture: Secondary | ICD-10-CM | POA: Diagnosis not present

## 2019-04-28 DIAGNOSIS — Z79899 Other long term (current) drug therapy: Secondary | ICD-10-CM | POA: Insufficient documentation

## 2019-04-28 DIAGNOSIS — Y92008 Other place in unspecified non-institutional (private) residence as the place of occurrence of the external cause: Secondary | ICD-10-CM | POA: Diagnosis not present

## 2019-04-28 DIAGNOSIS — Z7982 Long term (current) use of aspirin: Secondary | ICD-10-CM | POA: Diagnosis not present

## 2019-04-28 DIAGNOSIS — Y999 Unspecified external cause status: Secondary | ICD-10-CM | POA: Insufficient documentation

## 2019-04-28 DIAGNOSIS — I129 Hypertensive chronic kidney disease with stage 1 through stage 4 chronic kidney disease, or unspecified chronic kidney disease: Secondary | ICD-10-CM | POA: Insufficient documentation

## 2019-04-28 DIAGNOSIS — F17210 Nicotine dependence, cigarettes, uncomplicated: Secondary | ICD-10-CM | POA: Diagnosis not present

## 2019-04-28 DIAGNOSIS — E1122 Type 2 diabetes mellitus with diabetic chronic kidney disease: Secondary | ICD-10-CM | POA: Diagnosis not present

## 2019-04-28 DIAGNOSIS — S82841A Displaced bimalleolar fracture of right lower leg, initial encounter for closed fracture: Secondary | ICD-10-CM

## 2019-04-28 DIAGNOSIS — W01198A Fall on same level from slipping, tripping and stumbling with subsequent striking against other object, initial encounter: Secondary | ICD-10-CM | POA: Insufficient documentation

## 2019-04-28 DIAGNOSIS — Y9389 Activity, other specified: Secondary | ICD-10-CM | POA: Insufficient documentation

## 2019-04-28 DIAGNOSIS — W19XXXA Unspecified fall, initial encounter: Secondary | ICD-10-CM

## 2019-04-28 DIAGNOSIS — M25561 Pain in right knee: Secondary | ICD-10-CM | POA: Diagnosis not present

## 2019-04-28 DIAGNOSIS — N1831 Chronic kidney disease, stage 3a: Secondary | ICD-10-CM | POA: Insufficient documentation

## 2019-04-28 MED ORDER — ACETAMINOPHEN 160 MG/5ML PO SOLN: 1000.0000 mg | Freq: Once | ORAL | Status: DC

## 2019-04-28 MED ORDER — HYDROCODONE-ACETAMINOPHEN 5-325 MG PO TABS
1.0000 | ORAL_TABLET | Freq: Once | ORAL | Status: AC
  Administered 2019-04-28: 1 via ORAL
  Filled 2019-04-28: qty 1

## 2019-04-28 NOTE — Discharge Instructions (Addendum)
You have a nondisplaced bimalleolar fracture of your right ankle, you will need to remain in splint and keep this clean and dry until you are seen by orthopedics.  Please call to schedule an appointment for follow-up.  It is important that she do not bear weight on the ankle, elevate and ice the ankle as much as possible, use crutches were prescribed walker.  Use your home pain medication as needed for pain, you can take an additional 650 mg of Tylenol with this.  Return to the ED for new or worsening symptoms.

## 2019-04-28 NOTE — ED Triage Notes (Signed)
Pt arrives POV to ED with c/o right ankle pain after falling "in a dark room" at home this morning.

## 2019-04-28 NOTE — ED Provider Notes (Signed)
[facility] EMERGENCY DEPARTMENT Provider Note   CSN: [id number] Arrival date & time: 04/28/19  1014     History Chief Complaint  Patient presents with  . Ankle Pain    Jeanne [name] is a 59 y.o. female.  Jeanne [name] is a 59 y.o. female with a history of arthritis, diabetes, hypertension, hyperlipidemia, neuropathy, who presents to the emergency department for evaluation of right knee and ankle pain after mechanical fall this morning.  Patient states she was walking in her room in the dark and got tripped up falling and landing on her ankle and knee.  She did not hit her head, denies any loss of consciousness, denies any back or neck pain.  Denies any pain in the hip.  Reports pain is most severe in the right ankle and she has noticed swelling, she has not been able to bear weight since the fall.  Also reports some pain and swelling over the anterior medial knee.  Denies any previous injury or fracture to the ankle or knee, does have a history of arthritis.  She has not taken anything to treat pain prior to arrival.  Denies any numbness tingling or weakness.  No other aggravating or alleviating factors.        Past Medical History:  Diagnosis Date  . Antiphospholipid antibody syndrome ([place])   . Antiphospholipid antibody syndrome ([place])   . Anxiety   . Arthritis   . Bilateral swelling of feet   . Carpal tunnel syndrome 01/02/2014   Bilateral  . Clotting disorder ([place])   . Constipation   . Depression   . Diabetes mellitus type 2, insulin dependent ([place]) 02/26/2019  . Diabetes mellitus, type II ([place])   . Difficult airway for intubation 03/13/2019  . Difficult intubation 07/09/2013   Glidescope used, see Anesthesia note  . DVT (deep venous thrombosis) ([place]) 2014   LEFT LEG  . Gastritis   . Glaucoma   . Heart murmur   . History of blood clots   . Hyperlipidemia   . Hypertension   . Hypokalemia 03/05/2013  . Kidney disease    stage ll  . Neuromuscular  disorder ([place])    " NEUROPATHY IN  MY HANDS"  . Obsessive-compulsive disorder   . Osteoarthritis   . Rheumatoid arthritis ([place])   . Rheumatoid arthritis ([place]) 2016  . Schizo-affective psychosis ([place])   . Schizoaffective disorder ([place])   . Scleritis of both eyes   . Sleep apnea    Bipap  . Suppurative hidradenitis   . 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 Stage 3a chronic kidney disease 04/09/2019  . Vitamin D deficiency 04/09/2019  . Diabetes mellitus ([place]) 02/26/2019  . Chronic constipation, with Hx of ileus 02/26/2019  . Hypertension associated with diabetes ([place]) 02/26/2019  . Class 3 severe obesity with serious comorbidity and body mass index (BMI) of 40.0 to 44.9 in adult ([place]) 02/26/2019  . OCD (obsessive compulsive disorder) 04/10/2015  . Schizoaffective disorder, depressive type ([place]) 04/10/2015  . 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 DVT (deep venous thrombosis) ([place]) 03/22/2013  . Paresthesia 03/05/2013  . Obsessive compulsive disorder 03/05/2013  . Obstructive sleep apnea 03/05/2013  . Glaucoma 03/05/2013  . Connective tissue disorder ([place]) 03/05/2013    Past Surgical History:  Procedure Laterality Date  . BUNIONECTOMY    . CERVICAL CONIZATION W/BX    .  COLONOSCOPY WITH PROPOFOL N/A 03/13/2019   Procedure: COLONOSCOPY WITH PROPOFOL;  Surgeon: Doran Stabler, MD;  Location: [facility];  Service: Gastroenterology;  Laterality: N/A;  . LAPAROSCOPIC ADRENALECTOMY Left 07/09/2013   Procedure: LAPAROSCOPIC LEFT  ADRENALECTOMY;  Surgeon: Earnstine Regal, MD;  Location: [facility] ORS;  Service: General;  Laterality: Left;  . POLYPECTOMY  03/13/2019   Procedure: POLYPECTOMY;  Surgeon: Doran Stabler, MD;  Location: [facility] ENDOSCOPY;  Service: Gastroenterology;;  . sweat gland removal  1975     OB History    Gravida  0   Para  0   Term  0    Preterm  0   AB  0   Living  0     SAB  0   TAB  0   Ectopic  0   Multiple  0   Live Births  0           Family History  Problem Relation Age of Onset  . Cancer Maternal Aunt        breast ca  . Breast cancer Maternal Aunt   . Heart attack Maternal Grandmother 54  . Heart attack Maternal Grandfather 62  . Hypertension Sister   . Hypertension Sister   . Diabetes Mother   . Hypertension Mother   . Hyperlipidemia Mother   . Stroke Mother   . Depression Mother   . Eating disorder Mother   . Hypertension Father   . Seizures Maternal Uncle   . Alcohol abuse Neg Hx   . Anxiety disorder Neg Hx   . Bipolar disorder Neg Hx   . Drug abuse Neg Hx   . Schizophrenia Neg Hx   . Colon cancer Neg Hx   . Esophageal cancer Neg Hx   . Stomach cancer Neg Hx   . Rectal cancer Neg Hx     Social History   Tobacco Use  . Smoking status: Current Every Day Smoker    Packs/day: 0.50    Years: 25.00    Pack years: 12.50    Types: Cigarettes  . Smokeless tobacco: Never Used  Substance Use Topics  . Alcohol use: Yes    Alcohol/week: 0.0 standard drinks    Comment: " drink just on weekends " Last drink was in the spring of 2016  . Drug use: No    Home Medications Prior to Admission medications   Medication Sig Start Date End Date Taking? Authorizing Provider  amLODipine (NORVASC) 5 MG tablet Take 5 mg by mouth every morning.     [provider]  ARIPiprazole (ABILIFY) 30 MG tablet Take 1 tablet (30 mg total) by mouth daily. 03/22/19   Charlcie Cradle, MD  aspirin EC 81 MG tablet Take 162 mg by mouth daily.    [provider]  atorvastatin (LIPITOR) 20 MG tablet Take 1 tablet by mouth daily. 10/22/14   [provider]  buPROPion (WELLBUTRIN XL) 300 MG 24 hr tablet TAKE 1 TABLET(300 MG) BY MOUTH EVERY MORNING 03/22/19   Charlcie Cradle, MD  DULoxetine (CYMBALTA) 30 MG capsule Take 3 capsules (90 mg total) by mouth daily. 03/22/19   Charlcie Cradle, MD    esomeprazole (NEXIUM) 40 MG capsule Take 40 mg by mouth daily before breakfast.    [provider]  etanercept (ENBREL SURECLICK) 50 MG/ML injection Inject 50 mg into the skin once a week. Tuesday    [provider]  furosemide (LASIX) 40 MG tablet Take 40 mg by mouth.  [provider]  glucosamine-chondroitin 500-400 MG tablet Take 2 tablets by mouth every morning.    [provider]  insulin glargine (LANTUS) 100 UNIT/ML injection Inject 6 Units into the skin daily.     [provider]  JARDIANCE 25 MG TABS tablet Take 20 mg by mouth daily.  04/06/19   [provider]  Javier Docker Oil 300 MG CAPS Take 300 mg by mouth daily.    [provider]  levobunolol (BETAGAN) 0.5 % ophthalmic solution Place 1 drop into both eyes every morning.     [provider]  metFORMIN (GLUCOPHAGE) 1000 MG tablet Take 1,000 mg by mouth 2 (two) times daily with a meal.     [provider]  Multiple Vitamin (MULTIVITAMIN WITH MINERALS) TABS tablet Take 1 tablet by mouth daily.    [provider]  NIFEdipine (PROCARDIA-XL/ADALAT CC) 60 MG 24 hr tablet Take 1 tablet (60 mg total) by mouth daily. 07/16/13   Armandina Gemma, MD  oxyCODONE-acetaminophen (PERCOCET/ROXICET) 5-325 MG tablet Take 1-2 tablets by mouth every 4 (four) hours as needed for severe pain.    [provider]  PRESCRIPTION MEDICATION Inhale 1 application into the lungs at bedtime. BiPAP setting 8/12    [provider]  QUEtiapine (SEROQUEL) 300 MG tablet Take 2 tablets (600 mg total) by mouth at bedtime. 03/22/19   Charlcie Cradle, MD  Turmeric POWD Take 1,500 mcg by mouth daily at 12 noon.     [provider]  verapamil (CALAN-SR) 180 MG CR tablet Take 1 tablet (180 mg total) by mouth daily. 07/16/13   Armandina Gemma, MD  Vitamin D, Ergocalciferol, (DRISDOL) 1.25 MG (50000 UT) CAPS capsule Take 1 capsule (50,000 Units total) by mouth every 7 (seven)  days. 04/26/19   [name], DO    Allergies    Ace inhibitors, Arava [leflunomide], and Travatan [travoprost]  Review of Systems   Review of Systems  Constitutional: Negative for chills and fever.  Musculoskeletal: Positive for arthralgias and joint swelling. Negative for back pain and neck pain.  Skin: Negative for color change, rash and wound.  Neurological: Negative for weakness, numbness and headaches.    Physical Exam Updated Vital Signs BP (!) 120/57 (BP Location: Left Arm)   Pulse 83   Temp 99.6 F (37.6 C) (Oral)   Resp 20   Ht 5\' 9"  (1.753 m)   Wt 117 kg   SpO2 97%   BMI 38.09 kg/m   Physical Exam Vitals and nursing note reviewed.  Constitutional:      General: She is not in acute distress.    Appearance: Normal appearance. She is well-developed. She is not ill-appearing or diaphoretic.  HENT:     Head: Normocephalic and atraumatic.  Eyes:     General:        Right eye: No discharge.        Left eye: No discharge.  Pulmonary:     Effort: Pulmonary effort is normal. No respiratory distress.  Musculoskeletal:     Comments: Swelling and tenderness noted over the right ankle and right knee without obvious deformity, no erythema or warmth.  2+ DP and PT pulses and good capillary refill, range of motion at the ankle and knee is somewhat limited due to pain.  Sensation is intact throughout the right lower extremity.  No tenderness to palpation over the hip.  All other joints supple and easily movable.  All compartments soft.  Neurological:     Mental Status:  She is alert and oriented to person, place, and time.     Coordination: Coordination normal.  Psychiatric:        Mood and Affect: Mood normal.        Behavior: Behavior normal.     ED Results / Procedures / Treatments   Labs (all labs ordered are listed, but only abnormal results are displayed) Labs Reviewed - No data to display  EKG None  Radiology DG Ankle Complete Right  Result Date:  04/28/2019 CLINICAL DATA:  Fall, pain and swelling EXAM: RIGHT ANKLE - COMPLETE 3+ VIEW COMPARISON:  None. FINDINGS: Acute nondisplaced bimalleolar ankle fracture with surrounding soft tissue swelling. No associated subluxation or dislocation. Plantar calcaneal spur on the lateral view. Talus and calcaneus appear intact. Midfoot degenerative change noted of the tarsal bones dorsally with osteophytes. IMPRESSION: Acute nondisplaced bimalleolar right ankle fracture with soft tissue swelling. Electronically Signed   By: Jerilynn Mages.  Shick M.D.   On: 04/28/2019 11:30   DG Knee Complete 4 Views Right  Result Date: 04/28/2019 CLINICAL DATA:  Fall, right ankle fracture, pain EXAM: RIGHT KNEE - COMPLETE 4+ VIEW COMPARISON:  None. FINDINGS: Severe right knee tricompartmental osteoarthritis with joint space loss, sclerosis and osteophytes. Medial and lateral compartments are most affected. No acute osseous finding, fracture or malalignment. Small joint effusion suspected on the lateral view. IMPRESSION: Severe right knee tricompartmental osteoarthritis with small joint effusion. No acute osseous finding by plain radiography. Electronically Signed   By: Jerilynn Mages.  Shick M.D.   On: 04/28/2019 11:31    Procedures Procedures (including critical care time)  Medications Ordered in ED Medications  HYDROcodone-acetaminophen (NORCO/VICODIN) 5-325 MG per tablet 1 tablet (1 tablet Oral Given 04/28/19 1208)    ED Course  I have reviewed the triage vital signs and the nursing notes.  Pertinent labs & imaging results that were available during my care of the patient were reviewed by me and considered in my medical decision making (see chart for details).  Clinical Course as of Apr 27 1250  Sat Apr 28, 2019  1130 X-rays of the right ankle show a nondisplaced bimalleolar fracture, no widening of the ankle mortise noted, will place patient in short leg splint with crutches  DG Ankle Complete Right [KF]  1130 Tricompartmental arthritis of  the right knee but no evidence of fracture.  DG Knee Complete 4 Views Right [KF]    Clinical Course User Index [KF] Janet Berlin   MDM Rules/Calculators/A&P                      59 year old female presents after mechanical fall with injury to the right ankle and knee, x-rays show a nondisplaced bimalleolar fracture, that is neurovascularly intact, significant arthritis but no acute fractures of the right knee, patient placed in short leg splint, will have her follow-up with orthopedics, she is provided crutches as well as a prescription for a walker educated on nonweightbearing, ice and elevation, she is already prescribed chronic Percocet which she will use to help treat pain.  Orthopedic follow-up discussed.  Return precautions discussed.  Patient expresses understanding, discharged home in good condition.  Final Clinical Impression(s) / ED Diagnoses Final diagnoses:  Closed bimalleolar fracture of right ankle, initial encounter  Acute pain of right knee  Fall, initial encounter    Rx / DC Orders ED Discharge Orders         Ordered    Walker standard     04/28/19 1245  Jacqlyn Larsen, PA-C 04/28/19 1252    Tegeler, Gwenyth Allegra, MD 04/28/19 [phone]

## 2019-04-30 NOTE — Progress Notes (Signed)
Chief Complaint:   OBESITY Jeanne [name] is here to discuss her progress with her obesity treatment plan along with follow-up of her obesity related diagnoses. Jeanne [name] is on the Category 2 Plan and states she is following her eating plan approximately 60% of the time. Jeanne [name] states she is swimming in the pool for 45 minutes 3 times per week.  Today's visit was #: 5 Starting weight: 281 lbs Starting date: 02/26/2019 Today's weight: 258 lbs Today's date: 04/25/2019 Total lbs lost to date:  23 lbs  Total lbs lost since last in-office visit: 7 lbs  Interim History: Jeanne [name] states she is doing well and following the diet well.  She was off the plan for 2 days at Christmas.  She is still swimming for exercise.  Subjective:   1. Type 2 diabetes mellitus with other specified complication, with long-term current use of insulin ([place]) Jeanne [name]'s last A1c was 6.3 on 02/26/2019.  She is taking metformin, Jardiance, and Lantus.  She has had no hypoglycemic episodes.   2. Vitamin D deficiency She's Vitamin D level was 24.2 on 02/26/2019. She is currently taking vit D. She denies nausea, vomiting or muscle weakness.  3. Hypertension associated with diabetes ([place]) Review: taking medications as instructed, no medication side effects noted, no chest pain on exertion, no dyspnea on exertion, no swelling of ankles.   BP Readings from Last 3 Encounters:  04/28/19 118/74  04/25/19 110/72  04/09/19 105/67   4. Stage 3a chronic kidney disease Jeanne [name] has a diagnosis of stage 3a CKD.  She is stable at this time.  5. At risk for osteoporosis Jeanne [name] is at higher risk of osteopenia and osteoporosis due to Vitamin D deficiency.   Assessment/Plan:   1. Type 2 diabetes mellitus with other specified complication, with long-term current use of insulin (HCC) Jeanne [name] has been given diabetes education by myself today. Good blood sugar control is important to decrease the likelihood of  diabetic complications such as nephropathy, neuropathy, limb loss, blindness, coronary artery disease, and death. Intensive lifestyle modification including diet, exercise and weight loss were discussed as the first line treatment for diabetes.   2. Vitamin D deficiency Low Vitamin D level contributes to fatigue and are associated with obesity, breast, and colon cancer. She agrees to continue to take prescription Vitamin D @50 ,000 IU every week and will follow-up for routine testing of vitamin D, at least 2-3 times per year to avoid over-replacement.  Orders - Vitamin D, Ergocalciferol, (DRISDOL) 1.25 MG (50000 UT) CAPS capsule; Take 1 capsule (50,000 Units total) by mouth every 7 (seven) days.  Dispense: 4 capsule; Refill: 0  3. Hypertension associated with diabetes ([place]) Jeanne [name] is working on healthy weight loss and exercise to improve blood pressure control. We will watch for signs of hypotension as she continues her lifestyle modifications.  4. Stage 3a chronic kidney disease Will continue to monitor. Avoid nephrotoxic medications.  5. At risk for osteoporosis Jeanne [name] was given approximately 15 minutes of osteoporosis prevention counseling today. Jeanne [name] is at risk for osteopenia and osteoporosis due to her Vitamin D deficiency. She was encouraged to take her Vitamin D and follow her higher calcium diet and increase strengthening exercise to help strengthen her bones and decrease her risk of osteopenia and osteoporosis.  6. Class 2 severe obesity with serious comorbidity and body mass index (BMI) of 38.0 to 38.9 in adult, unspecified obesity type (HCC) Jeanne [name] is currently in the action stage of change. As such, her goal is to  continue with weight loss efforts. She has agreed to on the Category 2 Plan.   We discussed the following exercise goals today: For substantial health benefits, adults should do at least 150 minutes (2 hours and 30 minutes) a week of moderate-intensity, or 75  minutes (1 hour and 15 minutes) a week of vigorous-intensity aerobic physical activity, or an equivalent combination of moderate- and vigorous-intensity aerobic activity. Aerobic activity should be performed in episodes of at least 10 minutes, and preferably, it should be spread throughout the week. Adults should also include muscle-strengthening activities that involve all major muscle groups on 2 or more days a week.  We discussed the following behavioral modification strategies today: increasing water intake and meal planning and cooking strategies.  Jeanne [name] has agreed to follow-up with our clinic in 2 weeks. She was informed of the importance of frequent follow-up visits to maximize her success with intensive lifestyle modifications for her multiple health conditions.   Objective:   Blood pressure 110/72, pulse 91, temperature 98.8 F (37.1 C), temperature source Oral, height 5\' 9"  (1.753 m), weight 258 lb (117 kg), SpO2 98 %. Body mass index is 38.1 kg/m.  General: Cooperative, alert, well developed, in no acute distress. HEENT: Conjunctivae and lids unremarkable. Neck: No thyromegaly.  Cardiovascular: Regular rhythm.  Lungs: Normal work of breathing. Extremities: No edema.  Neurologic: No focal deficits.   Lab Results  Component Value Date   CREATININE 1.27 (H) 02/26/2019   BUN 20 02/26/2019   NA 138 02/26/2019   K 4.3 02/26/2019   CL 99 02/26/2019   CO2 27 02/26/2019   Lab Results  Component Value Date   ALT 29 02/26/2019   AST 20 02/26/2019   ALKPHOS 118 (H) 02/26/2019   BILITOT 0.4 02/26/2019   Lab Results  Component Value Date   HGBA1C 6.3 (H) 02/26/2019   Lab Results  Component Value Date   INSULIN 21.0 02/26/2019   Lab Results  Component Value Date   TSH 0.845 02/26/2019   Lab Results  Component Value Date   CHOL 173 02/26/2019   HDL 69 02/26/2019   LDLCALC 81 02/26/2019   TRIG 132 02/26/2019   Lab Results  Component Value Date   WBC 5.1  02/26/2019   HGB 15.5 02/26/2019   HCT 44.1 02/26/2019   MCV 93 02/26/2019   PLT 206 02/26/2019   No results found for: IRON, TIBC, FERRITIN  Attestation Statements:   Reviewed by clinician on day of visit: allergies, medications, problem list, medical history, surgical history, family history, social history, and previous encounter notes.  This visit occurred during the SARS-CoV-2 public health emergency. Safety protocols were in place, including screening questions prior to the visit, additional usage of staff PPE, and extensive cleaning of exam room while observing appropriate contact time as indicated for disinfecting solutions. (CPT [id number])  I, Water quality scientist, CMA, am acting as transcriptionist for [employer], DO.  I have reviewed the above documentation for accuracy and completeness, and I agree with the above. [name], DO

## 2019-05-02 ENCOUNTER — Ambulatory Visit: Payer: Medicare Other | Admitting: Orthopedic Surgery

## 2019-05-08 ENCOUNTER — Encounter (INDEPENDENT_AMBULATORY_CARE_PROVIDER_SITE_OTHER): Payer: Self-pay | Admitting: Family Medicine

## 2019-05-08 NOTE — Telephone Encounter (Signed)
FYI

## 2019-05-08 NOTE — Telephone Encounter (Signed)
Please advise 

## 2019-05-09 ENCOUNTER — Ambulatory Visit (INDEPENDENT_AMBULATORY_CARE_PROVIDER_SITE_OTHER): Payer: Medicare Other | Admitting: Family Medicine

## 2019-05-10 ENCOUNTER — Ambulatory Visit (INDEPENDENT_AMBULATORY_CARE_PROVIDER_SITE_OTHER): Payer: Medicare Other | Admitting: Psychology

## 2019-05-10 DIAGNOSIS — F251 Schizoaffective disorder, depressive type: Secondary | ICD-10-CM

## 2019-06-04 ENCOUNTER — Ambulatory Visit (INDEPENDENT_AMBULATORY_CARE_PROVIDER_SITE_OTHER): Payer: Medicare Other | Admitting: Family Medicine

## 2019-06-04 ENCOUNTER — Other Ambulatory Visit: Payer: Self-pay

## 2019-06-04 ENCOUNTER — Encounter (INDEPENDENT_AMBULATORY_CARE_PROVIDER_SITE_OTHER): Payer: Self-pay | Admitting: Family Medicine

## 2019-06-04 VITALS — BP 118/75 | HR 83 | Temp 98.6°F | Ht 69.0 in | Wt 256.0 lb

## 2019-06-04 DIAGNOSIS — S82891D Other fracture of right lower leg, subsequent encounter for closed fracture with routine healing: Secondary | ICD-10-CM

## 2019-06-04 DIAGNOSIS — E559 Vitamin D deficiency, unspecified: Secondary | ICD-10-CM | POA: Diagnosis not present

## 2019-06-04 DIAGNOSIS — E1159 Type 2 diabetes mellitus with other circulatory complications: Secondary | ICD-10-CM | POA: Diagnosis not present

## 2019-06-04 DIAGNOSIS — E1169 Type 2 diabetes mellitus with other specified complication: Secondary | ICD-10-CM | POA: Diagnosis not present

## 2019-06-04 DIAGNOSIS — N189 Chronic kidney disease, unspecified: Secondary | ICD-10-CM | POA: Diagnosis not present

## 2019-06-04 DIAGNOSIS — Z6837 Body mass index (BMI) 37.0-37.9, adult: Secondary | ICD-10-CM | POA: Diagnosis not present

## 2019-06-04 DIAGNOSIS — I10 Essential (primary) hypertension: Secondary | ICD-10-CM

## 2019-06-04 DIAGNOSIS — Z794 Long term (current) use of insulin: Secondary | ICD-10-CM

## 2019-06-04 DIAGNOSIS — I152 Hypertension secondary to endocrine disorders: Secondary | ICD-10-CM

## 2019-06-04 DIAGNOSIS — E785 Hyperlipidemia, unspecified: Secondary | ICD-10-CM

## 2019-06-04 DIAGNOSIS — E66812 Obesity, class 2: Secondary | ICD-10-CM

## 2019-06-04 MED ORDER — VITAMIN D (ERGOCALCIFEROL) 1.25 MG (50000 UNIT) PO CAPS: 50000.0000 [IU] | ORAL_CAPSULE | ORAL | 0 refills | Status: DC

## 2019-06-05 ENCOUNTER — Encounter (INDEPENDENT_AMBULATORY_CARE_PROVIDER_SITE_OTHER): Payer: Self-pay | Admitting: Family Medicine

## 2019-06-05 NOTE — Progress Notes (Signed)
Chief Complaint:   OBESITY Jeanne [name] is here to discuss her progress with her obesity treatment plan along with [name]-up of her obesity related diagnoses. Jeanne [name] is on the Category 2 Plan and states she is following her eating plan approximately 50% of the time. Jeanne [name] states she is exercising for 0 minutes 0 times per week.  Today's visit was #: 6 Starting weight: 281 lbs Starting date: 02/26/2019 Today's weight: 256 lbs Today's date: 06/04/2019 Total lbs lost to date: 25 lbs Total lbs lost since last in-office visit: 2 lbs  Interim History:  Jeanne [name] has a bimalleolar right ankle fracture.  She has been sitting all day for the past month.  She is fighting boredom eating.  She Jeanne be wearing a boot for another month.       Subjective:   1. Type 2 diabetes mellitus with other specified complication, with long-term current use of insulin (HCC) Medications reviewed. Patient is taking Lanuts and metformin.  Lab Results  Component Value Date   HGBA1C 6.3 (H) 02/26/2019   Lab Results  Component Value Date   LDLCALC 81 02/26/2019   CREATININE 1.27 (H) 02/26/2019   Lab Results  Component Value Date   INSULIN 21.0 02/26/2019   2. Vitamin D deficiency Jeanne [name]'s Vitamin D level was 24.2 on 02/26/2019. She is currently taking vit D. She denies nausea, vomiting or muscle weakness.  3. Chronic kidney disease, unspecified CKD stage Jeanne [name] is taking Percocet.  No abdominal pain.  Lab Results  Component Value Date   CREATININE 1.27 (H) 02/26/2019   CREATININE 1.18 (H) 02/01/2018   CREATININE 1.37 (H) 08/04/2015   Lab Results  Component Value Date   CREATININE 1.27 (H) 02/26/2019   BUN 20 02/26/2019   NA 138 02/26/2019   K 4.3 02/26/2019   CL 99 02/26/2019   CO2 27 02/26/2019   4. Hypertension associated with diabetes (Jeanne [name]) Jeanne [name]'s blood pressure is currently at goal.  BP Readings from Last 3 Encounters:  06/04/19 118/75  04/28/19 118/74  04/25/19  110/72   5. Hyperlipidemia associated with type 2 diabetes mellitus ([place]) Jeanne [name] has hyperlipidemia and has been trying to improve her cholesterol levels with intensive lifestyle modification including a low saturated fat diet, exercise and weight loss. She denies any chest pain, claudication or myalgias.  Lab Results  Component Value Date   ALT 29 02/26/2019   AST 20 02/26/2019   ALKPHOS 118 (H) 02/26/2019   BILITOT 0.4 02/26/2019   Lab Results  Component Value Date   CHOL 173 02/26/2019   HDL 69 02/26/2019   LDLCALC 81 02/26/2019   TRIG 132 02/26/2019   6. Closed fracture of right ankle with routine healing, subsequent encounter Jeanne [name] is followed by Dr. Alfonso Ramus at [facility].  Assessment/Plan:   1. Type 2 diabetes mellitus with other specified complication, with long-term current use of insulin (HCC) Good blood sugar control is important to decrease the likelihood of diabetic complications such as nephropathy, neuropathy, limb loss, blindness, coronary artery disease, and death. Intensive lifestyle modification including diet, exercise and weight loss are the first line of treatment for diabetes.   2. Vitamin D deficiency Low Vitamin D level contributes to fatigue and are associated with obesity, breast, and colon cancer. She agrees to continue to take prescription Vitamin D @50 ,000 IU every week and Jeanne [name]-up for routine testing of Vitamin D, at least 2-3 times per year to avoid over-replacement.  Orders - Vitamin D, Ergocalciferol, (DRISDOL) 1.25 MG (50000 UNIT) CAPS  capsule; Take 1 capsule (50,000 Units total) by mouth every 7 (seven) days.  Dispense: 4 capsule; Refill: 0  3. Chronic kidney disease, unspecified CKD stage Lab results and trends reviewed. We discussed several lifestyle modifications today and she Jeanne continue to work on diet, exercise and weight loss efforts. Avoid NSAIDs. Orders and [name] up as documented in patient record.    Counseling . Chronic kidney disease (CKD) happens when the kidneys are damaged over a long period of time. . Most of the time, this condition does not go away, but it can usually be controlled. Steps must be taken to slow down the kidney damage or to stop it from getting worse. . Intensive lifestyle modifications are the first line treatment for this issue.  Jeanne [name] Kitchen Avoid buying foods that are: processed, frozen, or prepackaged to avoid excess salt.  4. Hypertension associated with diabetes (Jeanne [name]) Jeanne [name] is working on healthy weight loss and exercise to improve blood pressure control. We Jeanne watch for signs of hypotension as she continues her lifestyle modifications.  5. Hyperlipidemia associated with type 2 diabetes mellitus (Jeanne [name]) Cardiovascular risk and specific lipid/LDL goals reviewed.  We discussed several lifestyle modifications today and Jeanne [name] Jeanne continue to work on diet, exercise and weight loss efforts. Orders and [name] up as documented in patient record.   Counseling Intensive lifestyle modifications are the first line treatment for this issue. . Dietary changes: Increase soluble fiber. Decrease simple carbohydrates. . Exercise changes: Moderate to vigorous-intensity aerobic activity 150 minutes per week if tolerated. . Lipid-lowering medications: see documented in medical record.  6. Closed fracture of right ankle with routine healing, subsequent encounter Jeanne [name] because mobility and pain control are important for weight management.  7. Class 2 severe obesity with serious comorbidity and body mass index (BMI) of 37.0 to 37.9 in adult, unspecified obesity type (HCC) Jeanne [name] is currently in the action stage of change. As such, her goal is to continue with weight loss efforts. She has agreed to the Category 2 Plan.   Exercise goals: No exercise has been prescribed at this time.  Behavioral modification strategies: increasing lean protein intake.  Increase Metamucil  with water to stay hydrated and to decrease hunger/cravings.  Jeanne [name] has agreed to [name]-up with our clinic in 2 weeks. She was informed of the importance of frequent [name]-up visits to maximize her success with intensive lifestyle modifications for her multiple health conditions.   Objective:   Blood pressure 118/75, pulse 83, temperature 98.6 F (37 C), temperature source Oral, height 5\' 9"  (1.753 m), weight 256 lb (116.1 kg), SpO2 99 %. Body mass index is 37.8 kg/m.  General: Cooperative, alert, well developed, in no acute distress. HEENT: Conjunctivae and lids unremarkable. Cardiovascular: Regular rhythm.  Lungs: Normal work of breathing. Neurologic: No focal deficits.   Lab Results  Component Value Date   CREATININE 1.27 (H) 02/26/2019   BUN 20 02/26/2019   NA 138 02/26/2019   K 4.3 02/26/2019   CL 99 02/26/2019   CO2 27 02/26/2019   Lab Results  Component Value Date   ALT 29 02/26/2019   AST 20 02/26/2019   ALKPHOS 118 (H) 02/26/2019   BILITOT 0.4 02/26/2019   Lab Results  Component Value Date   HGBA1C 6.3 (H) 02/26/2019   Lab Results  Component Value Date   INSULIN 21.0 02/26/2019   Lab Results  Component Value Date   TSH 0.845 02/26/2019   Lab Results  Component Value Date   CHOL 173 02/26/2019  HDL 69 02/26/2019   LDLCALC 81 02/26/2019   TRIG 132 02/26/2019   Lab Results  Component Value Date   WBC 5.1 02/26/2019   HGB 15.5 02/26/2019   HCT 44.1 02/26/2019   MCV 93 02/26/2019   PLT 206 02/26/2019   Obesity Behavioral Intervention:   Approximately 15 minutes were spent on the discussion below.  ASK: We discussed the diagnosis of obesity with Naika today and Isla agreed to give [name] permission to discuss obesity behavioral modification therapy today.  ASSESS: Vondalee has the diagnosis of obesity and her BMI today is 37.8. Deaunna is in the action stage of change.   ADVISE: Jenene was educated on the multiple health  risks of obesity as well as the benefit of weight loss to improve her health. She was advised of the need for long term treatment and the importance of lifestyle modifications to improve her current health and to decrease her risk of future health problems.  AGREE: Multiple dietary modification options and treatment options were discussed and Monserratt agreed to [name] the recommendations documented in the above note.  ARRANGE: Sueko was educated on the importance of frequent visits to treat obesity as outlined per CMS and USPSTF guidelines and agreed to schedule her next [name] up appointment today.  Attestation Statements:   Reviewed by clinician on day of visit: allergies, medications, problem list, medical history, surgical history, family history, social history, and previous encounter notes.  I, Water quality scientist, CMA, am acting as Location manager for [employer], DO.  I have reviewed the above documentation for accuracy and completeness, and I agree with the above. [name], DO

## 2019-06-12 ENCOUNTER — Ambulatory Visit (INDEPENDENT_AMBULATORY_CARE_PROVIDER_SITE_OTHER): Payer: Medicare Other | Admitting: Psychology

## 2019-06-12 DIAGNOSIS — F251 Schizoaffective disorder, depressive type: Secondary | ICD-10-CM | POA: Diagnosis not present

## 2019-06-25 ENCOUNTER — Ambulatory Visit (INDEPENDENT_AMBULATORY_CARE_PROVIDER_SITE_OTHER): Payer: Medicare Other | Admitting: Family Medicine

## 2019-06-25 ENCOUNTER — Other Ambulatory Visit: Payer: Self-pay

## 2019-06-25 ENCOUNTER — Encounter (INDEPENDENT_AMBULATORY_CARE_PROVIDER_SITE_OTHER): Payer: Self-pay | Admitting: Family Medicine

## 2019-06-25 VITALS — BP 129/78 | HR 80 | Temp 98.2°F | Ht 69.0 in | Wt 255.0 lb

## 2019-06-25 DIAGNOSIS — E1169 Type 2 diabetes mellitus with other specified complication: Secondary | ICD-10-CM | POA: Diagnosis not present

## 2019-06-25 DIAGNOSIS — N189 Chronic kidney disease, unspecified: Secondary | ICD-10-CM | POA: Diagnosis not present

## 2019-06-25 DIAGNOSIS — Z6838 Body mass index (BMI) 38.0-38.9, adult: Secondary | ICD-10-CM | POA: Diagnosis not present

## 2019-06-25 DIAGNOSIS — E559 Vitamin D deficiency, unspecified: Secondary | ICD-10-CM | POA: Diagnosis not present

## 2019-06-25 DIAGNOSIS — S82891D Other fracture of right lower leg, subsequent encounter for closed fracture with routine healing: Secondary | ICD-10-CM

## 2019-06-25 DIAGNOSIS — Z794 Long term (current) use of insulin: Secondary | ICD-10-CM

## 2019-06-25 MED ORDER — VITAMIN D (ERGOCALCIFEROL) 1.25 MG (50000 UNIT) PO CAPS: 50000.0000 [IU] | ORAL_CAPSULE | ORAL | 0 refills | Status: DC

## 2019-06-25 NOTE — Progress Notes (Signed)
Chief Complaint:   OBESITY Elisa is here to discuss her progress with her obesity treatment plan along with follow-up of her obesity related diagnoses. Salyna is on the Category 2 Plan and states she is following her eating plan approximately 25% of the time. Micaiah states she is exercising for 0 minutes 0 times per week.  Today's visit was #: 7 Starting weight: 281 lbs Starting date: 02/26/2019 Today's weight: 255 lbs Today's date: 06/25/2019 Total lbs lost to date: 26 lbs Total lbs lost since last in-office visit: 1 lb  Interim History: Shemicka admits to self-sabotage over the past 2 weeks.  She says that it happens when she has success.  She is still in a boot for a right ankle fracture, and this causes right knee pain.  She is hoping to remove the boot tomorrow and start PT.  She is ready to recommit to the plan.  Subjective:   1. Vitamin D deficiency Liberta's Vitamin D level was 24.2 on 02/26/2019. She is currently taking vit D. She denies nausea, vomiting or muscle weakness.  2. Type 2 diabetes mellitus with other specified complication, with long-term current use of insulin (HCC) A1c was 5.5 with PCP.  She is taking metformin and Jardiance.  Lab Results  Component Value Date   HGBA1C 6.3 (H) 02/26/2019   Lab Results  Component Value Date   LDLCALC 81 02/26/2019   CREATININE 1.27 (H) 02/26/2019   Lab Results  Component Value Date   INSULIN 21.0 02/26/2019   3. Chronic kidney disease  Lab Results  Component Value Date   CREATININE 1.27 (H) 02/26/2019   CREATININE 1.18 (H) 02/01/2018   CREATININE 1.37 (H) 08/04/2015   Lab Results  Component Value Date   CREATININE 1.27 (H) 02/26/2019   BUN 20 02/26/2019   NA 138 02/26/2019   K 4.3 02/26/2019   CL 99 02/26/2019   CO2 27 02/26/2019   4. Closed fracture of right ankle with routine healing, subsequent encounter Denijah with follow up with Orthopedics tomorrow.  She is hoping for boot  removal.  Assessment/Plan:   1. Vitamin D deficiency Low Vitamin D level contributes to fatigue and are associated with obesity, breast, and colon cancer. She agrees to continue to take prescription Vitamin D @50 ,000 IU every week and will follow-up for routine testing of Vitamin D, at least 2-3 times per year to avoid over-replacement.  Orders - Vitamin D, Ergocalciferol, (DRISDOL) 1.25 MG (50000 UNIT) CAPS capsule; Take 1 capsule (50,000 Units total) by mouth every 7 (seven) days.  Dispense: 4 capsule; Refill: 0  2. Type 2 diabetes mellitus with other specified complication, with long-term current use of insulin ([place]) I recommend stopping metformin.  Creatinine is 1.27.  Continue Jardiance.   3. Chronic kidney disease Lab results and trends reviewed. We discussed several lifestyle modifications today and she will continue to work on diet, exercise and weight loss efforts. Avoid nephrotoxic medications. Orders and follow up as documented in patient record.   Counseling . Chronic kidney disease (CKD) happens when the kidneys are damaged over a long period of time. . Most of the time, this condition does not go away, but it can usually be controlled. Steps must be taken to slow down the kidney damage or to stop it from getting worse. . Intensive lifestyle modifications are the first line treatment for this issue.  [name] Avoid buying foods that are: processed, frozen, or prepackaged to avoid excess salt.  4. Closed fracture of  right ankle with routine healing, subsequent encounter Will follow because mobility and pain control are important for weight management.  5. Class 2 severe obesity with serious comorbidity and body mass index (BMI) of 38.0 to 38.9 in adult, unspecified obesity type (HCC) Sommer is currently in the action stage of change. As such, her goal is to continue with weight loss efforts. She has agreed to the Category 2 Plan.   Exercise goals: All adults should avoid  inactivity. Some physical activity is better than none, and adults who participate in any amount of physical activity gain some health benefits.  Behavioral modification strategies: increasing lean protein intake and increasing water intake.  Vashawn has agreed to follow-up with our clinic in 2 weeks. She was informed of the importance of frequent follow-up visits to maximize her success with intensive lifestyle modifications for her multiple health conditions.   Objective:   Blood pressure 129/78, pulse 80, temperature 98.2 F (36.8 C), temperature source Oral, height 5\' 9"  (1.753 m), weight 255 lb (115.7 kg), SpO2 100 %. Body mass index is 37.66 kg/m.  General: Cooperative, alert, well developed, in no acute distress. HEENT: Conjunctivae and lids unremarkable. Cardiovascular: Regular rhythm.  Lungs: Normal work of breathing. Neurologic: No focal deficits.   Lab Results  Component Value Date   CREATININE 1.27 (H) 02/26/2019   BUN 20 02/26/2019   NA 138 02/26/2019   K 4.3 02/26/2019   CL 99 02/26/2019   CO2 27 02/26/2019   Lab Results  Component Value Date   ALT 29 02/26/2019   AST 20 02/26/2019   ALKPHOS 118 (H) 02/26/2019   BILITOT 0.4 02/26/2019   Lab Results  Component Value Date   HGBA1C 6.3 (H) 02/26/2019   Lab Results  Component Value Date   INSULIN 21.0 02/26/2019   Lab Results  Component Value Date   TSH 0.845 02/26/2019   Lab Results  Component Value Date   CHOL 173 02/26/2019   HDL 69 02/26/2019   LDLCALC 81 02/26/2019   TRIG 132 02/26/2019   Lab Results  Component Value Date   WBC 5.1 02/26/2019   HGB 15.5 02/26/2019   HCT 44.1 02/26/2019   MCV 93 02/26/2019   PLT 206 02/26/2019   Obesity Behavioral Intervention:   Approximately 15 minutes were spent on the discussion below.  ASK: We discussed the diagnosis of obesity with Xandria today and Deannie agreed to give [name] permission to discuss obesity behavioral modification therapy  today.  ASSESS: Jennalee has the diagnosis of obesity and her BMI today is 38.08. Treniyah is in the action stage of change.   ADVISE: Takiyah was educated on the multiple health risks of obesity as well as the benefit of weight loss to improve her health. She was advised of the need for long term treatment and the importance of lifestyle modifications to improve her current health and to decrease her risk of future health problems.  AGREE: Multiple dietary modification options and treatment options were discussed and Denisha agreed to follow the recommendations documented in the above note.  ARRANGE: Tailee was educated on the importance of frequent visits to treat obesity as outlined per CMS and USPSTF guidelines and agreed to schedule her next follow up appointment today.  Attestation Statements:   Reviewed by clinician on day of visit: allergies, medications, problem list, medical history, surgical history, family history, social history, and previous encounter notes.  I, Water quality scientist, CMA, am acting as Location manager for [employer], DO.  I have  reviewed the above documentation for accuracy and completeness, and I agree with the above. [name], DO

## 2019-07-03 ENCOUNTER — Ambulatory Visit: Payer: Medicare Other | Admitting: Podiatry

## 2019-07-03 ENCOUNTER — Other Ambulatory Visit: Payer: Self-pay

## 2019-07-03 ENCOUNTER — Encounter: Payer: Self-pay | Admitting: Podiatry

## 2019-07-03 DIAGNOSIS — M79675 Pain in left toe(s): Secondary | ICD-10-CM

## 2019-07-03 DIAGNOSIS — I739 Peripheral vascular disease, unspecified: Secondary | ICD-10-CM

## 2019-07-03 DIAGNOSIS — E1141 Type 2 diabetes mellitus with diabetic mononeuropathy: Secondary | ICD-10-CM | POA: Diagnosis not present

## 2019-07-03 DIAGNOSIS — B351 Tinea unguium: Secondary | ICD-10-CM

## 2019-07-03 DIAGNOSIS — L84 Corns and callosities: Secondary | ICD-10-CM

## 2019-07-03 DIAGNOSIS — M79674 Pain in right toe(s): Secondary | ICD-10-CM

## 2019-07-03 NOTE — Patient Instructions (Addendum)
Moisturize feet once daily; do not apply between toes: Vaseline Intensive Care Lotion Lubriderm Lotion Gold Bond Diabetic Foot Lotion Eucerin Intensive Repair Moisturizing Lotion  If you have problems reaching your feet:  Aquaphor Advanced Therapy Ointment Body Spray Vaseline Intensive Care Spray Lotion Advanced Repair     Diabetes Mellitus and Foot Care Foot care is an important part of your health, especially when you have diabetes. Diabetes may cause you to have problems because of poor blood flow (circulation) to your feet and legs, which can cause your skin to:  Become thinner and drier.  Break more easily.  Heal more slowly.  Peel and crack. You may also have nerve damage (neuropathy) in your legs and feet, causing decreased feeling in them. This means that you may not notice minor injuries to your feet that could lead to more serious problems. Noticing and addressing any potential problems early is the best way to prevent future foot problems. How to care for your feet Foot hygiene  Wash your feet daily with warm water and mild soap. Do not use hot water. Then, pat your feet and the areas between your toes until they are completely dry. Do not soak your feet as this can dry your skin.  Trim your toenails straight across. Do not dig under them or around the cuticle. File the edges of your nails with an emery board or nail file.  Apply a moisturizing lotion or petroleum jelly to the skin on your feet and to dry, brittle toenails. Use lotion that does not contain alcohol and is unscented. Do not apply lotion between your toes. Shoes and socks  Wear clean socks or stockings every day. Make sure they are not too tight. Do not wear knee-high stockings since they may decrease blood flow to your legs.  Wear shoes that fit properly and have enough cushioning. Always look in your shoes before you put them on to be sure there are no objects inside.  To break in new shoes, wear them  for just a few hours a day. This prevents injuries on your feet. Wounds, scrapes, corns, and calluses  Check your feet daily for blisters, cuts, bruises, sores, and redness. If you cannot see the bottom of your feet, use a mirror or ask someone for help.  Do not cut corns or calluses or try to remove them with medicine.  If you find a minor scrape, cut, or break in the skin on your feet, keep it and the skin around it clean and dry. You may clean these areas with mild soap and water. Do not clean the area with peroxide, alcohol, or iodine.  If you have a wound, scrape, corn, or callus on your foot, look at it several times a day to make sure it is healing and not infected. Check for: ? Redness, swelling, or pain. ? Fluid or blood. ? Warmth. ? Pus or a bad smell. General instructions  Do not cross your legs. This may decrease blood flow to your feet.  Do not use heating pads or hot water bottles on your feet. They may burn your skin. If you have lost feeling in your feet or legs, you may not know this is happening until it is too late.  Protect your feet from hot and cold by wearing shoes, such as at the beach or on hot pavement.  Schedule a complete foot exam at least once a year (annually) or more often if you have foot problems. If you have   foot problems, report any cuts, sores, or bruises to your health care provider immediately. Contact a health care provider if:  You have a medical condition that increases your risk of infection and you have any cuts, sores, or bruises on your feet.  You have an injury that is not healing.  You have redness on your legs or feet.  You feel burning or tingling in your legs or feet.  You have pain or cramps in your legs and feet.  Your legs or feet are numb.  Your feet always feel cold.  You have pain around a toenail. Get help right away if:  You have a wound, scrape, corn, or callus on your foot and: ? You have pain, swelling, or  redness that gets worse. ? You have fluid or blood coming from the wound, scrape, corn, or callus. ? Your wound, scrape, corn, or callus feels warm to the touch. ? You have pus or a bad smell coming from the wound, scrape, corn, or callus. ? You have a fever. ? You have a red line going up your leg. Summary  Check your feet every day for cuts, sores, red spots, swelling, and blisters.  Moisturize feet and legs daily.  Wear shoes that fit properly and have enough cushioning.  If you have foot problems, report any cuts, sores, or bruises to your health care provider immediately.  Schedule a complete foot exam at least once a year (annually) or more often if you have foot problems. This information is not intended to replace advice given to you by your health care provider. Make sure you discuss any questions you have with your health care provider. Document Revised: 12/27/2018 Document Reviewed: 05/07/2016 Elsevier Patient Education  2020 [employer]  

## 2019-07-03 NOTE — Progress Notes (Addendum)
Subjective: Jeanne [name] presents today for follow up of preventative diabetic foot care and callus(es) b/l feet and painful mycotic toenails b/l that are difficult to trim. Pain interferes with ambulation. Aggravating factors include wearing enclosed shoe gear. Pain is relieved with periodic professional debridement.   She voices no new pedal problems on today's visit.   Allergies  Allergen Reactions  . Ace Inhibitors Anaphylaxis  . Arava [Leflunomide] Other (See Comments)    Nephritis and kidney clearance issues  . Travatan [Travoprost] Other (See Comments)    Red eyes, irritation      Objective: There were no vitals filed for this visit.  Pt 59 y.o. year old AA female  WD, WN in NAD. AAO x 3.   Vascular Examination:  Capillary fill time to digits <3 seconds b/l. Faintly palpable DP pulses b/l. Nonpalpable PT pulses b/l. Pedal hair present b/l. Skin temperature gradient within normal limits b/l. Varicosities present b/l.  Dermatological Examination: Pedal skin with normal turgor, texture and tone bilaterally. No open wounds bilaterally. No interdigital macerations bilaterally. Toenails 1-5 b/l elongated, dystrophic, thickened, crumbly with subungual debris and tenderness to dorsal palpation. Hyperkeratotic lesion(s) b/l hallux IPJ and submet head 1 left foot.  No erythema, no edema, no drainage, no flocculence.  Musculoskeletal: Normal muscle strength 5/5 to all lower extremity muscle groups bilaterally, no pain crepitus or joint limitation noted with ROM b/l, hammertoes noted to the  2-5 bilaterally, pes planus deformity noted and utilizes cane for ambulation assistance  Neurological: Protective sensation intact 5/5 intact bilaterally with 10g monofilament b/l Vibratory sensation absent b/l  Assessment: 1. Pain due to onychomycosis of toenails of both feet   2. Callus   3. PVD (peripheral vascular disease) ([place])   4. Diabetic mononeuropathy associated with type 2 diabetes  mellitus ([place])    Plan: -Continue diabetic foot care principles. Literature dispensed on today.  -Toenails 1-5 b/l were debrided in length and girth with sterile nail nippers and dremel without iatrogenic bleeding.  -Calluses b/l hallux and submet head 1 left foot were debrided without complication or incident. Total number debrided =3. -Patient to continue soft, supportive shoe gear daily. -Patient to report any pedal injuries to medical professional immediately. -Patient/POA to call should there be question/concern in the interim.  Return in about 3 months (around 10/03/2019) for diabetic nail trim.

## 2019-07-11 ENCOUNTER — Encounter (INDEPENDENT_AMBULATORY_CARE_PROVIDER_SITE_OTHER): Payer: Self-pay | Admitting: Family Medicine

## 2019-07-11 ENCOUNTER — Ambulatory Visit (INDEPENDENT_AMBULATORY_CARE_PROVIDER_SITE_OTHER): Payer: Medicare Other | Admitting: Family Medicine

## 2019-07-11 ENCOUNTER — Other Ambulatory Visit: Payer: Self-pay

## 2019-07-11 VITALS — BP 112/74 | HR 77 | Temp 98.4°F | Ht 69.0 in | Wt 255.0 lb

## 2019-07-11 DIAGNOSIS — E1121 Type 2 diabetes mellitus with diabetic nephropathy: Secondary | ICD-10-CM

## 2019-07-11 DIAGNOSIS — K5909 Other constipation: Secondary | ICD-10-CM

## 2019-07-11 DIAGNOSIS — E1141 Type 2 diabetes mellitus with diabetic mononeuropathy: Secondary | ICD-10-CM | POA: Diagnosis not present

## 2019-07-11 DIAGNOSIS — Z6837 Body mass index (BMI) 37.0-37.9, adult: Secondary | ICD-10-CM

## 2019-07-11 DIAGNOSIS — E559 Vitamin D deficiency, unspecified: Secondary | ICD-10-CM

## 2019-07-12 MED ORDER — LINACLOTIDE 72 MCG PO CAPS: 72.0000 ug | ORAL_CAPSULE | Freq: Every day | ORAL | 0 refills | Status: DC

## 2019-07-16 NOTE — Progress Notes (Signed)
Chief Complaint:   OBESITY Jeanne [name] is here to discuss her progress with her obesity treatment plan along with follow-up of her obesity related diagnoses. Jeanne [name] is on the Category 2 Plan and states she is following her eating plan approximately 30% of the time. Jeanne [name] states she is doing water aerobics for 60 minutes 4 times per week.  Today's visit was #: 8 Starting weight: 281 lbs Starting date: 02/26/2019 Today's weight: 255 lbs Today's date: 07/11/2019 Total lbs lost to date: 26 lbs Total lbs lost since last in-office visit: 0  Interim History: Jeanne [name] is no longer wearing the boot on her foot/ankle and says she is happy to be back in water aerobics.  She is still not fully on plan.  Subjective:   1. Diabetes mellitus with nephropathy ([place]) Medications reviewed. She is taking metformin 1000 mg daily. Endocrine ROS: negative for - polydipsia/polyuria or unexpected weight changes.  Lab Results  Component Value Date   HGBA1C 6.3 (H) 02/26/2019   Lab Results  Component Value Date   LDLCALC 81 02/26/2019   CREATININE 1.27 (H) 02/26/2019   Lab Results  Component Value Date   INSULIN 21.0 02/26/2019   2. Vitamin D deficiency Jeanne [name]'s Vitamin D level was 24.2 on 02/26/2019. She is currently taking prescription vitamin D 50,000 IU each week. She denies nausea, vomiting or muscle weakness.  3. Diabetic mononeuropathy associated with type 2 diabetes mellitus ([place]) I have reviewed provider notes.  4. Chronic constipation Jeanne [name] notes constipation.   Assessment/Plan:   1. Diabetes mellitus with nephropathy ([place]) Good blood sugar control is important to decrease the likelihood of diabetic complications such as nephropathy, neuropathy, limb loss, blindness, coronary artery disease, and death. Intensive lifestyle modification including diet, exercise and weight loss are the first line of treatment for diabetes.   2. Vitamin D deficiency Low Vitamin D level  contributes to fatigue and are associated with obesity, breast, and colon cancer. She agrees to continue to take prescription Vitamin D @50 ,000 IU every week and will follow-up for routine testing of Vitamin D, at least 2-3 times per year to avoid over-replacement.  3. Diabetic mononeuropathy associated with type 2 diabetes mellitus ([place]) Orders and follow up as documented in patient record.  4. Chronic constipation Jeanne [name] was informed that a decrease in bowel movement frequency is normal while losing weight, but stools should not be hard or painful. Patient has tried a bowel regimen similar to the one recommended below. We reviewed this again but discussed adding Linzess. After discussion, patient would like to start this medication. Expectations, risks, and potential side effects reviewed. Orders and follow up as documented in patient record.   Counseling Getting to Good Bowel Health: Your goal is to have one soft bowel movement each day. Drink at least 8 glasses of water each day. Eat plenty of fiber (goal is over 25 grams each day). It is best to get most of your fiber from dietary sources which includes leafy green vegetables, fresh fruit, and whole grains. You may need to add fiber with the help of OTC fiber supplements. I recommend Metamucil. If you are still having trouble, try adding Miralax.   Orders - linaclotide (LINZESS) 72 MCG capsule; Take 1 capsule (72 mcg total) by mouth daily before breakfast.  Dispense: 30 capsule; Refill: 0  5. Class 2 severe obesity with serious comorbidity and body mass index (BMI) of 37.0 to 37.9 in adult, unspecified obesity type (HCC) Jeanne [name] is currently in the action stage  of change. As such, her goal is to continue with weight loss efforts. She has agreed to the Category 2 Plan.   Exercise goals: As is.  Behavioral modification strategies: increasing lean protein intake.  Jeanne [name] has agreed to follow-up with our clinic in 2 weeks. She was  informed of the importance of frequent follow-up visits to maximize her success with intensive lifestyle modifications for her multiple health conditions.   Objective:   Blood pressure 112/74, pulse 77, temperature 98.4 F (36.9 C), temperature source Oral, height 5\' 9"  (1.753 m), weight 255 lb (115.7 kg), SpO2 98 %. Body mass index is 37.66 kg/m.  General: Cooperative, alert, well developed, in no acute distress. HEENT: Conjunctivae and lids unremarkable. Cardiovascular: Regular rhythm.  Lungs: Normal work of breathing. Neurologic: No focal deficits.   Lab Results  Component Value Date   CREATININE 1.27 (H) 02/26/2019   BUN 20 02/26/2019   NA 138 02/26/2019   K 4.3 02/26/2019   CL 99 02/26/2019   CO2 27 02/26/2019   Lab Results  Component Value Date   ALT 29 02/26/2019   AST 20 02/26/2019   ALKPHOS 118 (H) 02/26/2019   BILITOT 0.4 02/26/2019   Lab Results  Component Value Date   HGBA1C 6.3 (H) 02/26/2019   Lab Results  Component Value Date   INSULIN 21.0 02/26/2019   Lab Results  Component Value Date   TSH 0.845 02/26/2019   Lab Results  Component Value Date   CHOL 173 02/26/2019   HDL 69 02/26/2019   LDLCALC 81 02/26/2019   TRIG 132 02/26/2019   Lab Results  Component Value Date   WBC 5.1 02/26/2019   HGB 15.5 02/26/2019   HCT 44.1 02/26/2019   MCV 93 02/26/2019   PLT 206 02/26/2019    Obesity Behavioral Intervention:   Approximately 15 minutes were spent on the discussion below.  ASK: We discussed the diagnosis of obesity with Jeanne [name] today and Jeanne [name] agreed to give [name] permission to discuss obesity behavioral modification therapy today.  ASSESS: Jeanne [name] has the diagnosis of obesity and her BMI today is 37.8. Jeanne [name] is in the action stage of change.   ADVISE: Jeanne [name] was educated on the multiple health risks of obesity as well as the benefit of weight loss to improve her health. She was advised of the need for long term treatment and  the importance of lifestyle modifications to improve her current health and to decrease her risk of future health problems.  AGREE: Multiple dietary modification options and treatment options were discussed and Jeanne [name] agreed to follow the recommendations documented in the above note.  ARRANGE: Jeanne [name] was educated on the importance of frequent visits to treat obesity as outlined per CMS and USPSTF guidelines and agreed to schedule her next follow up appointment today.  Attestation Statements:   Reviewed by clinician on day of visit: allergies, medications, problem list, medical history, surgical history, family history, social history, and previous encounter notes.  I, Water quality scientist, CMA, am acting as Location manager for [employer], DO.  I have reviewed the above documentation for accuracy and completeness, and I agree with the above. [name], DO

## 2019-07-19 ENCOUNTER — Ambulatory Visit (INDEPENDENT_AMBULATORY_CARE_PROVIDER_SITE_OTHER): Payer: Medicare Other | Admitting: Psychology

## 2019-07-19 DIAGNOSIS — F251 Schizoaffective disorder, depressive type: Secondary | ICD-10-CM | POA: Diagnosis not present

## 2019-07-30 ENCOUNTER — Ambulatory Visit (INDEPENDENT_AMBULATORY_CARE_PROVIDER_SITE_OTHER): Payer: Medicare Other | Admitting: Family Medicine

## 2019-07-30 ENCOUNTER — Other Ambulatory Visit: Payer: Self-pay

## 2019-07-30 ENCOUNTER — Encounter (INDEPENDENT_AMBULATORY_CARE_PROVIDER_SITE_OTHER): Payer: Self-pay | Admitting: Family Medicine

## 2019-07-30 VITALS — HR 78 | Temp 98.2°F | Ht 69.0 in | Wt 252.0 lb

## 2019-07-30 DIAGNOSIS — E559 Vitamin D deficiency, unspecified: Secondary | ICD-10-CM

## 2019-07-30 DIAGNOSIS — E1169 Type 2 diabetes mellitus with other specified complication: Secondary | ICD-10-CM

## 2019-07-30 DIAGNOSIS — Z6837 Body mass index (BMI) 37.0-37.9, adult: Secondary | ICD-10-CM | POA: Diagnosis not present

## 2019-07-30 DIAGNOSIS — Z794 Long term (current) use of insulin: Secondary | ICD-10-CM

## 2019-07-30 DIAGNOSIS — M25552 Pain in left hip: Secondary | ICD-10-CM

## 2019-07-30 DIAGNOSIS — M25571 Pain in right ankle and joints of right foot: Secondary | ICD-10-CM

## 2019-07-30 DIAGNOSIS — G8929 Other chronic pain: Secondary | ICD-10-CM

## 2019-07-31 MED ORDER — VITAMIN D (ERGOCALCIFEROL) 1.25 MG (50000 UNIT) PO CAPS: 50000.0000 [IU] | ORAL_CAPSULE | ORAL | 0 refills | Status: DC

## 2019-07-31 NOTE — Progress Notes (Signed)
Chief Complaint:   OBESITY Jeanne [name] is here to discuss her progress with her obesity treatment plan along with follow-up of her obesity related diagnoses. Jeanne [name] is on the Category 2 Plan and states she is following her eating plan approximately 50% of the time. Jeanne [name] states she is doing water aerobics for 40 minutes 4 times per week.  Today's visit was #: 9 Starting weight: 281 lbs Starting date: 02/26/2019 Today's weight: 252 lbs Today's date: 07/30/2019 Total lbs lost to date: 29 lbs Total lbs lost since last in-office visit: 3 lbs  Interim History: Jeanne [name] is doing well.  She has been working with [facility].  She feels that it has been great for her left hip.  Subjective:   1. Left hip pain Jeanne [name] has been going to PT for her hip pain.  2. Type 2 diabetes mellitus with other specified complication, with long-term current use of insulin (HCC) Medications reviewed. Diabetic ROS: no polyuria or polydipsia, no chest pain, dyspnea or TIA's, no numbness, tingling or pain in extremities. She is taking Jardiance and metformin.   Lab Results  Component Value Date   HGBA1C 6.3 (H) 02/26/2019   Lab Results  Component Value Date   LDLCALC 81 02/26/2019   CREATININE 1.27 (H) 02/26/2019   Lab Results  Component Value Date   INSULIN 21.0 02/26/2019   3. Chronic pain of right ankle This has improved. She has the last follow up for this today.  4. Vitamin D deficiency Jeanne [name]'s Vitamin D level was 24.2 on 02/26/2019. She is currently taking prescription vitamin D 50,000 IU each week. She denies nausea, vomiting or muscle weakness.  Assessment/Plan:   1. Left hip pain Will follow because mobility and pain control are important for weight management.  2. Type 2 diabetes mellitus with other specified complication, with long-term current use of insulin (HCC) Good blood sugar control is important to decrease the likelihood of diabetic complications such as  nephropathy, neuropathy, limb loss, blindness, coronary artery disease, and death. Intensive lifestyle modification including diet, exercise and weight loss are the first line of treatment for diabetes.   3. Chronic pain of right ankle Will continue to monitor.  4. Vitamin D deficiency Low Vitamin D level contributes to fatigue and are associated with obesity, breast, and colon cancer. She agrees to continue to take prescription Vitamin D @50 ,000 IU every week and will follow-up for routine testing of Vitamin D, at least 2-3 times per year to avoid over-replacement. - Vitamin D, Ergocalciferol, (DRISDOL) 1.25 MG (50000 UNIT) CAPS capsule; Take 1 capsule (50,000 Units total) by mouth every 7 (seven) days.  Dispense: 4 capsule; Refill: 0  5. Class 2 severe obesity with serious comorbidity and body mass index (BMI) of 37.0 to 37.9 in adult, unspecified obesity type (HCC) Jeanne [name] is currently in the action stage of change. As such, her goal is to continue with weight loss efforts. She has agreed to the Category 2 Plan.   Exercise goals: As is.  PT.  Behavioral modification strategies: increasing lean protein intake and increasing water intake.  Jeanne [name] has agreed to follow-up with our clinic in 2 weeks. She was informed of the importance of frequent follow-up visits to maximize her success with intensive lifestyle modifications for her multiple health conditions.   Objective:   Pulse 78, temperature 98.2 F (36.8 C), temperature source Oral, height 5\' 9"  (1.753 m), weight 252 lb (114.3 kg), SpO2 98 %. Body mass index is 37.21 kg/m.  General: Cooperative, alert, well developed, in no acute distress. HEENT: Conjunctivae and lids unremarkable. Cardiovascular: Regular rhythm.  Lungs: Normal work of breathing. Neurologic: No focal deficits.   Lab Results  Component Value Date   CREATININE 1.27 (H) 02/26/2019   BUN 20 02/26/2019   NA 138 02/26/2019   K 4.3 02/26/2019   CL 99 02/26/2019    CO2 27 02/26/2019   Lab Results  Component Value Date   ALT 29 02/26/2019   AST 20 02/26/2019   ALKPHOS 118 (H) 02/26/2019   BILITOT 0.4 02/26/2019   Lab Results  Component Value Date   HGBA1C 6.3 (H) 02/26/2019   Lab Results  Component Value Date   INSULIN 21.0 02/26/2019   Lab Results  Component Value Date   TSH 0.845 02/26/2019   Lab Results  Component Value Date   CHOL 173 02/26/2019   HDL 69 02/26/2019   LDLCALC 81 02/26/2019   TRIG 132 02/26/2019   Lab Results  Component Value Date   WBC 5.1 02/26/2019   HGB 15.5 02/26/2019   HCT 44.1 02/26/2019   MCV 93 02/26/2019   PLT 206 02/26/2019   Obesity Behavioral Intervention:   Approximately 15 minutes were spent on the discussion below.  ASK: We discussed the diagnosis of obesity with Jeanne [name] today and Jeanne [name] agreed to give [name] permission to discuss obesity behavioral modification therapy today.  ASSESS: Jeanne [name] has the diagnosis of obesity and her BMI today is 37.2. Jeanne [name] is in the action stage of change.   ADVISE: Jeanne [name] was educated on the multiple health risks of obesity as well as the benefit of weight loss to improve her health. She was advised of the need for long term treatment and the importance of lifestyle modifications to improve her current health and to decrease her risk of future health problems.  AGREE: Multiple dietary modification options and treatment options were discussed and Jeanne [name] agreed to follow the recommendations documented in the above note.  ARRANGE: Jeanne [name] was educated on the importance of frequent visits to treat obesity as outlined per CMS and USPSTF guidelines and agreed to schedule her next follow up appointment today.  Attestation Statements:   Reviewed by clinician on day of visit: allergies, medications, problem list, medical history, surgical history, family history, social history, and previous encounter notes.  I, Water quality scientist, CMA, am acting as  Location manager for [employer], DO.  I have reviewed the above documentation for accuracy and completeness, and I agree with the above. [name], DO

## 2019-08-20 ENCOUNTER — Ambulatory Visit (HOSPITAL_COMMUNITY)
Admission: RE | Admit: 2019-08-20 | Discharge: 2019-08-20 | Disposition: A | Discharge status: Home / Self Care | Payer: Medicare Other | Source: Ambulatory Visit | Attending: Surgery | Admitting: Surgery

## 2019-08-20 ENCOUNTER — Other Ambulatory Visit (HOSPITAL_COMMUNITY): Payer: Self-pay | Admitting: Internal Medicine

## 2019-08-20 ENCOUNTER — Other Ambulatory Visit: Payer: Self-pay

## 2019-08-20 DIAGNOSIS — R52 Pain, unspecified: Secondary | ICD-10-CM | POA: Diagnosis present

## 2019-08-20 DIAGNOSIS — R0989 Other specified symptoms and signs involving the circulatory and respiratory systems: Secondary | ICD-10-CM

## 2019-08-22 ENCOUNTER — Ambulatory Visit (INDEPENDENT_AMBULATORY_CARE_PROVIDER_SITE_OTHER): Payer: Medicare Other | Admitting: Family Medicine

## 2019-08-23 ENCOUNTER — Ambulatory Visit (INDEPENDENT_AMBULATORY_CARE_PROVIDER_SITE_OTHER): Payer: Medicare Other | Admitting: Psychology

## 2019-08-23 DIAGNOSIS — F251 Schizoaffective disorder, depressive type: Secondary | ICD-10-CM | POA: Diagnosis not present

## 2019-09-03 ENCOUNTER — Encounter (INDEPENDENT_AMBULATORY_CARE_PROVIDER_SITE_OTHER): Payer: Self-pay | Admitting: Family Medicine

## 2019-09-03 ENCOUNTER — Ambulatory Visit (INDEPENDENT_AMBULATORY_CARE_PROVIDER_SITE_OTHER): Payer: Medicare Other | Admitting: Family Medicine

## 2019-09-03 ENCOUNTER — Other Ambulatory Visit: Payer: Self-pay

## 2019-09-03 VITALS — BP 128/77 | HR 76 | Temp 98.1°F | Ht 69.0 in | Wt 254.0 lb

## 2019-09-03 DIAGNOSIS — Z6837 Body mass index (BMI) 37.0-37.9, adult: Secondary | ICD-10-CM | POA: Diagnosis not present

## 2019-09-03 DIAGNOSIS — I10 Essential (primary) hypertension: Secondary | ICD-10-CM

## 2019-09-03 DIAGNOSIS — E1169 Type 2 diabetes mellitus with other specified complication: Secondary | ICD-10-CM | POA: Diagnosis not present

## 2019-09-03 DIAGNOSIS — Z794 Long term (current) use of insulin: Secondary | ICD-10-CM

## 2019-09-03 DIAGNOSIS — I152 Hypertension secondary to endocrine disorders: Secondary | ICD-10-CM

## 2019-09-03 DIAGNOSIS — E785 Hyperlipidemia, unspecified: Secondary | ICD-10-CM

## 2019-09-03 DIAGNOSIS — E1159 Type 2 diabetes mellitus with other circulatory complications: Secondary | ICD-10-CM

## 2019-09-03 DIAGNOSIS — N1831 Chronic kidney disease, stage 3a: Secondary | ICD-10-CM

## 2019-09-03 NOTE — Progress Notes (Signed)
Chief Complaint:   OBESITY Evangelyne is here to discuss her progress with her obesity treatment plan along with follow-up of her obesity related diagnoses. Daje is on the Category 2 Plan and states she is following her eating plan approximately 25% of the time. Kealie states she is doing water aerobics for 45 minutes 4 times per week.  Today's visit was #: 10 Starting weight: 281 lbs Starting date: 02/26/2019 Today's weight: 254 lbs Today's date: 09/03/2019 Total lbs lost to date: 27 lbs Total lbs lost since last in-office visit: 0  Interim History: Maliyah says that "everything is at goal" as far as her labs are concerned.  Will request labs from her PCP.    Subjective:   1. Hypertension associated with diabetes ([place]) Review: taking medications as instructed, no medication side effects noted, no chest pain on exertion, no dyspnea on exertion, no swelling of ankles.   BP Readings from Last 3 Encounters:  09/03/19 128/77  07/11/19 112/74  06/25/19 129/78   2. Hyperlipidemia associated with type 2 diabetes mellitus ([place]) Cleopha has hyperlipidemia and has been trying to improve her cholesterol levels with intensive lifestyle modification including a low saturated fat diet, exercise and weight loss. She denies any chest pain, claudication or myalgias.  Lab Results  Component Value Date   ALT 29 02/26/2019   AST 20 02/26/2019   ALKPHOS 118 (H) 02/26/2019   BILITOT 0.4 02/26/2019   Lab Results  Component Value Date   CHOL 173 02/26/2019   HDL 69 02/26/2019   LDLCALC 81 02/26/2019   TRIG 132 02/26/2019   3. Stage 3a chronic kidney disease Lab Results  Component Value Date   CREATININE 1.27 (H) 02/26/2019   CREATININE 1.18 (H) 02/01/2018   CREATININE 1.37 (H) 08/04/2015   Lab Results  Component Value Date   CREATININE 1.27 (H) 02/26/2019   BUN 20 02/26/2019   NA 138 02/26/2019   K 4.3 02/26/2019   CL 99 02/26/2019   CO2 27 02/26/2019   4. Type 2  diabetes mellitus with other specified complication, with long-term current use of insulin (HCC) Medications reviewed. Diabetic ROS: no polyuria or polydipsia, no chest pain, dyspnea or TIA's, no numbness, tingling or pain in extremities.  Travonda says that her latest A1c is 5.6.  Lab Results  Component Value Date   HGBA1C 6.3 (H) 02/26/2019   Lab Results  Component Value Date   LDLCALC 81 02/26/2019   CREATININE 1.27 (H) 02/26/2019   Lab Results  Component Value Date   INSULIN 21.0 02/26/2019   Assessment/Plan:   1. Hypertension associated with diabetes ([place]) Astha is working on healthy weight loss and exercise to improve blood pressure control. We will watch for signs of hypotension as she continues her lifestyle modifications.  2. Hyperlipidemia associated with type 2 diabetes mellitus ([place]) Cardiovascular risk and specific lipid/LDL goals reviewed.  We discussed several lifestyle modifications today and Makailyn will continue to work on diet, exercise and weight loss efforts. Orders and follow up as documented in patient record.   Counseling Intensive lifestyle modifications are the first line treatment for this issue. . Dietary changes: Increase soluble fiber. Decrease simple carbohydrates. . Exercise changes: Moderate to vigorous-intensity aerobic activity 150 minutes per week if tolerated. . Lipid-lowering medications: see documented in medical record.  3. Stage 3a chronic kidney disease Lab results and trends reviewed. We discussed several lifestyle modifications today and she will continue to work on diet, exercise and weight loss efforts. Avoid  nephrotoxic medications. Orders and follow up as documented in patient record.   Counseling . Chronic kidney disease (CKD) happens when the kidneys are damaged over a long period of time. . Most of the time, this condition does not go away, but it can usually be controlled. Steps must be taken to slow down the kidney damage  or to stop it from getting worse. . Intensive lifestyle modifications are the first line treatment for this issue.  [name] Avoid buying foods that are: processed, frozen, or prepackaged to avoid excess salt.  4. Type 2 diabetes mellitus with other specified complication, with long-term current use of insulin (HCC) Good blood sugar control is important to decrease the likelihood of diabetic complications such as nephropathy, neuropathy, limb loss, blindness, coronary artery disease, and death. Intensive lifestyle modification including diet, exercise and weight loss are the first line of treatment for diabetes.   5. Class 2 severe obesity with serious comorbidity and body mass index (BMI) of 37.0 to 37.9 in adult, unspecified obesity type (HCC) Ayanna is currently in the action stage of change. As such, her goal is to continue with weight loss efforts. She has agreed to the Category 2 Plan.   Exercise goals: For substantial health benefits, adults should do at least 150 minutes (2 hours and 30 minutes) a week of moderate-intensity, or 75 minutes (1 hour and 15 minutes) a week of vigorous-intensity aerobic physical activity, or an equivalent combination of moderate- and vigorous-intensity aerobic activity. Aerobic activity should be performed in episodes of at least 10 minutes, and preferably, it should be spread throughout the week.  Behavioral modification strategies: increasing lean protein intake and decreasing liquid calories.  Brantlee has agreed to follow-up with our clinic in 3 weeks.  She has not been seen in 5 weeks.  Needs 2-3 week accountability.  She was informed of the importance of frequent follow-up visits to maximize her success with intensive lifestyle modifications for her multiple health conditions.   Objective:   Blood pressure 128/77, pulse 76, temperature 98.1 F (36.7 C), temperature source Oral, height 5\' 9"  (1.753 m), weight 254 lb (115.2 kg), SpO2 99 %. Body mass index is  37.51 kg/m.  General: Cooperative, alert, well developed, in no acute distress. HEENT: Conjunctivae and lids unremarkable. Cardiovascular: Regular rhythm.  Lungs: Normal work of breathing. Neurologic: No focal deficits.   Lab Results  Component Value Date   CREATININE 1.27 (H) 02/26/2019   BUN 20 02/26/2019   NA 138 02/26/2019   K 4.3 02/26/2019   CL 99 02/26/2019   CO2 27 02/26/2019   Lab Results  Component Value Date   ALT 29 02/26/2019   AST 20 02/26/2019   ALKPHOS 118 (H) 02/26/2019   BILITOT 0.4 02/26/2019   Lab Results  Component Value Date   HGBA1C 6.3 (H) 02/26/2019   Lab Results  Component Value Date   INSULIN 21.0 02/26/2019   Lab Results  Component Value Date   TSH 0.845 02/26/2019   Lab Results  Component Value Date   CHOL 173 02/26/2019   HDL 69 02/26/2019   LDLCALC 81 02/26/2019   TRIG 132 02/26/2019   Lab Results  Component Value Date   WBC 5.1 02/26/2019   HGB 15.5 02/26/2019   HCT 44.1 02/26/2019   MCV 93 02/26/2019   PLT 206 02/26/2019   Obesity Behavioral Intervention:   Approximately 15 minutes were spent on the discussion below.  ASK: We discussed the diagnosis of obesity with Caragh today and  Ilena agreed to give [name] permission to discuss obesity behavioral modification therapy today.  ASSESS: Brixton has the diagnosis of obesity and her BMI today is 37.6. Marquisia is in the action stage of change.   ADVISE: Alyeska was educated on the multiple health risks of obesity as well as the benefit of weight loss to improve her health. She was advised of the need for long term treatment and the importance of lifestyle modifications to improve her current health and to decrease her risk of future health problems.  AGREE: Multiple dietary modification options and treatment options were discussed and Shatonya agreed to follow the recommendations documented in the above note.  ARRANGE: Oliviana was educated on the importance  of frequent visits to treat obesity as outlined per CMS and USPSTF guidelines and agreed to schedule her next follow up appointment today.  Attestation Statements:   Reviewed by clinician on day of visit: allergies, medications, problem list, medical history, surgical history, family history, social history, and previous encounter notes.  I, Water quality scientist, CMA, am acting as Location manager for [employer], DO.  I have reviewed the above documentation for accuracy and completeness, and I agree with the above. [name], DO

## 2019-09-20 ENCOUNTER — Other Ambulatory Visit: Payer: Self-pay

## 2019-09-20 ENCOUNTER — Telehealth (INDEPENDENT_AMBULATORY_CARE_PROVIDER_SITE_OTHER): Payer: Medicare Other | Admitting: Psychiatry

## 2019-09-20 DIAGNOSIS — F429 Obsessive-compulsive disorder, unspecified: Secondary | ICD-10-CM

## 2019-09-20 DIAGNOSIS — F251 Schizoaffective disorder, depressive type: Secondary | ICD-10-CM | POA: Diagnosis not present

## 2019-09-20 MED ORDER — DULOXETINE HCL 30 MG PO CPEP: 90.0000 mg | ORAL_CAPSULE | Freq: Every day | ORAL | 1 refills | Status: DC

## 2019-09-20 MED ORDER — BUPROPION HCL ER (XL) 300 MG PO TB24: ORAL_TABLET | ORAL | 1 refills | Status: DC

## 2019-09-20 MED ORDER — ARIPIPRAZOLE 30 MG PO TABS: 30.0000 mg | ORAL_TABLET | Freq: Every day | ORAL | 1 refills | Status: DC

## 2019-09-20 MED ORDER — QUETIAPINE FUMARATE 300 MG PO TABS: 600.0000 mg | ORAL_TABLET | Freq: Every day | ORAL | 1 refills | Status: DC

## 2019-09-20 NOTE — Progress Notes (Signed)
Virtual Visit via Telephone Note  I connected with Jeanne [name] on 09/20/19 at  1:30 PM EDT by telephone and verified that I am speaking with the correct person using two identifiers.  Location: Patient: home Provider: office   I discussed the limitations, risks, security and privacy concerns of performing an evaluation and management service by telephone and the availability of in person appointments. I also discussed with the patient that there may be a patient responsible charge related to this service. The patient expressed understanding and agreed to proceed.   History of Present Illness: Jeanne [name] is doing well. She is finding a lot of productive ways to keep busy. When she is not busy it is easy for her get depressed. She is social and active. It has helped that she has gotten her COVID vaccine. Her depression is stable for the most part. The last time it was bad was in March. She is sleeping well. Jeanne [name] is working with [facility] to help with weight loss. She has lost 30 lbs. Jeanne [name] is going to the pool twice a week for exercise. She denies manic and hypomanic like symptoms. She denies SI/HI.  She denies AVH, paranoia and ideas of reference.    Observations/Objective:  General Appearance: unable to assess  Eye Contact:  unable to assess  Speech:  Clear and Coherent and Normal Rate  Volume:  Normal  Mood:  Euthymic  Affect:  Full Range  Thought Process:  Goal Directed, Linear and Descriptions of Associations: Intact  Orientation:  Full (Time, Place, and Person)  Thought Content:  Logical  Suicidal Thoughts:  No  Homicidal Thoughts:  No  Memory:  Immediate;   Good  Judgement:  Good  Insight:  Good  Psychomotor Activity: unable to assess  Concentration:  Concentration: Good  Recall:  Good  Fund of Knowledge:  Good  Language:  Good  Akathisia:  unable to assess  Handed:  Right  AIMS (if indicated):     Assets:  Communication Skills Desire for Improvement Financial  Resources/Insurance Housing Leisure Time Resilience Social Support Talents/Skills Transportation Vocational/Educational  ADL's:  unable to assess  Cognition:  WNL  Sleep:         Assessment and Plan:   1. Schizoaffective disorder, depressive type (HCC)  - ARIPiprazole (ABILIFY) 30 MG tablet; Take 1 tablet (30 mg total) by mouth daily.  Dispense: 90 tablet; Refill: 1  - buPROPion (WELLBUTRIN XL) 300 MG 24 hr tablet; TAKE 1 TABLET(300 MG) BY MOUTH  EVERY MORNING  Dispense: 90 tablet; Refill: 1  - DULoxetine (CYMBALTA) 30 MG capsule; Take 3 capsules (90 mg total) by mouth daily.  Dispense: 270 capsule; Refill: 1  - QUEtiapine (SEROQUEL) 300 MG tablet; Take 2 tablets (600 mg total) by mouth at bedtime.  Dispense: 180 tablet; Refill: 1  2. Obsessive-compulsive disorder, unspecified type  - DULoxetine (CYMBALTA) 30 MG capsule; Take 3 capsules (90 mg total) by mouth daily.  Dispense: 270 capsule; Refill: 1    Follow Up Instructions: In 5-6 months or sooner if needed   I discussed the assessment and treatment plan with the patient. The patient was provided an opportunity to ask questions and all were answered. The patient agreed with the plan and demonstrated an understanding of the instructions.   The patient was advised to call back or seek an in-person evaluation if the symptoms worsen or if the condition fails to improve as anticipated.  I provided 20 minutes of non-face-to-face time during this encounter.   [employer]  Doyne Keel, MD

## 2019-09-26 ENCOUNTER — Other Ambulatory Visit: Payer: Self-pay

## 2019-09-26 ENCOUNTER — Ambulatory Visit (INDEPENDENT_AMBULATORY_CARE_PROVIDER_SITE_OTHER): Payer: Medicare Other | Admitting: Family Medicine

## 2019-09-26 ENCOUNTER — Encounter (INDEPENDENT_AMBULATORY_CARE_PROVIDER_SITE_OTHER): Payer: Self-pay | Admitting: Family Medicine

## 2019-09-26 VITALS — BP 110/69 | HR 78 | Temp 98.0°F | Ht 69.0 in | Wt 257.0 lb

## 2019-09-26 DIAGNOSIS — E669 Obesity, unspecified: Secondary | ICD-10-CM

## 2019-09-26 DIAGNOSIS — E559 Vitamin D deficiency, unspecified: Secondary | ICD-10-CM | POA: Diagnosis not present

## 2019-09-26 DIAGNOSIS — Z6838 Body mass index (BMI) 38.0-38.9, adult: Secondary | ICD-10-CM

## 2019-09-26 DIAGNOSIS — K5909 Other constipation: Secondary | ICD-10-CM | POA: Diagnosis not present

## 2019-09-26 MED ORDER — LINACLOTIDE 72 MCG PO CAPS: 72.0000 ug | ORAL_CAPSULE | Freq: Every day | ORAL | 1 refills | Status: DC

## 2019-09-27 MED ORDER — VITAMIN D (ERGOCALCIFEROL) 1.25 MG (50000 UNIT) PO CAPS: 50000.0000 [IU] | ORAL_CAPSULE | ORAL | 0 refills | Status: DC

## 2019-09-27 NOTE — Progress Notes (Signed)
Chief Complaint:   OBESITY Syana is here to discuss her progress with her obesity treatment plan along with follow-up of her obesity related diagnoses. Xoey is on the Category 2 Plan and states she is following her eating plan approximately 25% of the time. Leahmarie states she is walking for 60 minutes 2 times per week.  Today's visit was #: 11 Starting weight: 281 lbs Starting date: 02/26/2019 Today's weight: 257 lbs Today's date: 09/27/2019 Total lbs lost to date: 24 lbs Total lbs lost since last in-office visit: 0  Interim History: Kiamesha is up 7 pounds of water today.  She says she has decreased her exercise; she has not been in the pool.  She has been doing more night eating, mostly fruit.  She says she is bloated and is chronically constipated.  She is taking MiraLAX twice daily.  Assessment/Plan:   1. Vitamin D deficiency Lavoris's Vitamin D level was 24.2 on 02/26/2019. She is currently taking prescription vitamin D 50,000 IU each week.   Low Vitamin D level contributes to fatigue and are associated with obesity, breast, and colon cancer. She agrees to continue to take prescription Vitamin D @50 ,000 IU every week and will follow-up for routine testing of Vitamin D, at least 2-3 times per year to avoid over-replacement.  Orders - Vitamin D, Ergocalciferol, (DRISDOL) 1.25 MG (50000 UNIT) CAPS capsule; Take 1 capsule (50,000 Units total) by mouth every 7 (seven) days.  Dispense: 4 capsule; Refill: 0  2. Chronic constipation Will start Charnel on Linzess 72 mcg daily.  Talayla was informed that a decrease in bowel movement frequency is normal while losing weight, but stools should not be hard or painful. Orders and follow up as documented in patient record.   Counseling Getting to Good Bowel Health: Your goal is to have one soft bowel movement each day. Drink at least 8 glasses of water each day. Eat plenty of fiber (goal is over 25 grams each day). It is best  to get most of your fiber from dietary sources which includes leafy green vegetables, fresh fruit, and whole grains. You may need to add fiber with the help of OTC fiber supplements. These include Metamucil, Citrucel, and Flaxseed. If you are still having trouble, try adding Miralax or Magnesium Citrate. If all of these changes do not work, Cabin crew.  3. Class 2 obesity without serious comorbidity with body mass index (BMI) of 38.0 to 38.9 in adult, unspecified obesity type Sahana is currently in the action stage of change. As such, her goal is to continue with weight loss efforts. She has agreed to the Category 2 Plan.   Exercise goals: For substantial health benefits, adults should do at least 150 minutes (2 hours and 30 minutes) a week of moderate-intensity, or 75 minutes (1 hour and 15 minutes) a week of vigorous-intensity aerobic physical activity, or an equivalent combination of moderate- and vigorous-intensity aerobic activity. Aerobic activity should be performed in episodes of at least 10 minutes, and preferably, it should be spread throughout the week.  Behavioral modification strategies: increasing lean protein intake and increasing water intake.  Evamaria has agreed to follow-up with our clinic in 3 weeks. She was informed of the importance of frequent follow-up visits to maximize her success with intensive lifestyle modifications for her multiple health conditions.   Objective:   Blood pressure 110/69, pulse 78, temperature 98 F (36.7 C), temperature source Oral, height 5\' 9"  (1.753 m), weight 257 lb (116.6 kg), SpO2  96 %. Body mass index is 37.95 kg/m.  General: Cooperative, alert, well developed, in no acute distress. HEENT: Conjunctivae and lids unremarkable. Cardiovascular: Regular rhythm.  Lungs: Normal work of breathing. Neurologic: No focal deficits.   Lab Results  Component Value Date   CREATININE 1.27 (H) 02/26/2019   BUN 20 02/26/2019   NA 138  02/26/2019   K 4.3 02/26/2019   CL 99 02/26/2019   CO2 27 02/26/2019   Lab Results  Component Value Date   ALT 29 02/26/2019   AST 20 02/26/2019   ALKPHOS 118 (H) 02/26/2019   BILITOT 0.4 02/26/2019   Lab Results  Component Value Date   HGBA1C 6.3 (H) 02/26/2019   Lab Results  Component Value Date   INSULIN 21.0 02/26/2019   Lab Results  Component Value Date   TSH 0.845 02/26/2019   Lab Results  Component Value Date   CHOL 173 02/26/2019   HDL 69 02/26/2019   LDLCALC 81 02/26/2019   TRIG 132 02/26/2019   Lab Results  Component Value Date   WBC 5.1 02/26/2019   HGB 15.5 02/26/2019   HCT 44.1 02/26/2019   MCV 93 02/26/2019   PLT 206 02/26/2019   Obesity Behavioral Intervention:   Approximately 15 minutes were spent on the discussion below.  ASK: We discussed the diagnosis of obesity with Brentley today and Aarvi agreed to give [name] permission to discuss obesity behavioral modification therapy today.  ASSESS: Amaal has the diagnosis of obesity and her BMI today is 38.0. Moe is in the action stage of change.   ADVISE: Nandita was educated on the multiple health risks of obesity as well as the benefit of weight loss to improve her health. She was advised of the need for long term treatment and the importance of lifestyle modifications to improve her current health and to decrease her risk of future health problems.  AGREE: Multiple dietary modification options and treatment options were discussed and Mekayla agreed to follow the recommendations documented in the above note.  ARRANGE: Meriam was educated on the importance of frequent visits to treat obesity as outlined per CMS and USPSTF guidelines and agreed to schedule her next follow up appointment today.  Attestation Statements:   Reviewed by clinician on day of visit: allergies, medications, problem list, medical history, surgical history, family history, social history, and previous  encounter notes.  I, Water quality scientist, CMA, am acting as transcriptionist for [name], DO  I have reviewed the above documentation for accuracy and completeness, and I agree with the above. [name], DO

## 2019-10-05 ENCOUNTER — Ambulatory Visit: Payer: Medicare Other | Admitting: Podiatry

## 2019-10-05 ENCOUNTER — Encounter: Payer: Self-pay | Admitting: Podiatry

## 2019-10-05 ENCOUNTER — Other Ambulatory Visit: Payer: Self-pay

## 2019-10-05 VITALS — Temp 96.7°F

## 2019-10-05 DIAGNOSIS — M79674 Pain in right toe(s): Secondary | ICD-10-CM | POA: Diagnosis not present

## 2019-10-05 DIAGNOSIS — M79675 Pain in left toe(s): Secondary | ICD-10-CM | POA: Diagnosis not present

## 2019-10-05 DIAGNOSIS — I739 Peripheral vascular disease, unspecified: Secondary | ICD-10-CM

## 2019-10-05 DIAGNOSIS — B351 Tinea unguium: Secondary | ICD-10-CM

## 2019-10-05 DIAGNOSIS — E1141 Type 2 diabetes mellitus with diabetic mononeuropathy: Secondary | ICD-10-CM

## 2019-10-05 DIAGNOSIS — L84 Corns and callosities: Secondary | ICD-10-CM | POA: Diagnosis not present

## 2019-10-05 NOTE — Patient Instructions (Addendum)
EPSOM SALT FOOT SOAK INSTRUCTIONS  Shopping List:  A. Plain epsom salt (not scented) B. Neosporin Cream/Ointment or Bacitracin Cream/Ointment C. 1-inch fabric band-aids   1.  Place 1/4 cup of epsom salts in 2 quarts of warm tap water. IF YOU ARE DIABETIC, OR HAVE NEUROPATHY, CHECK THE TEMPERATURE OF THE WATER WITH YOUR ELBOW.  2.  Submerge your foot/feet in the solution and soak for 10-15 minutes.      3.  Next, remove your foot or feet from solution, blot dry the affected area.    4.  Apply antibiotic ointment and cover with fabric band-aid .  5.  This soak should be done once a day for 3 days.   6.  Monitor for any signs/symptoms of infection such as redness, swelling, odor, drainage, increased pain, or non-healing of digit.   7.  Please do not hesitate to call the office and speak to a Nurse or Doctor if you have questions.   8.  If you experience fever, chills, nightsweats, nausea or vomiting with worsening of digit, please go to the emergency room.    Diabetes Mellitus and Foot Care Foot care is an important part of your health, especially when you have diabetes. Diabetes may cause you to have problems because of poor blood flow (circulation) to your feet and legs, which can cause your skin to:  Become thinner and drier.  Break more easily.  Heal more slowly.  Peel and crack. You may also have nerve damage (neuropathy) in your legs and feet, causing decreased feeling in them. This means that you may not notice minor injuries to your feet that could lead to more serious problems. Noticing and addressing any potential problems early is the best way to prevent future foot problems. How to care for your feet Foot hygiene  Wash your feet daily with warm water and mild soap. Do not use hot water. Then, pat your feet and the areas between your toes until they are completely dry. Do not soak your feet as this can dry your skin.  Trim your toenails straight across. Do not dig  under them or around the cuticle. File the edges of your nails with an emery board or nail file.  Apply a moisturizing lotion or petroleum jelly to the skin on your feet and to dry, brittle toenails. Use lotion that does not contain alcohol and is unscented. Do not apply lotion between your toes. Shoes and socks  Wear clean socks or stockings every day. Make sure they are not too tight. Do not wear knee-high stockings since they may decrease blood flow to your legs.  Wear shoes that fit properly and have enough cushioning. Always look in your shoes before you put them on to be sure there are no objects inside.  To break in new shoes, wear them for just a few hours a day. This prevents injuries on your feet. Wounds, scrapes, corns, and calluses  Check your feet daily for blisters, cuts, bruises, sores, and redness. If you cannot see the bottom of your feet, use a mirror or ask someone for help.  Do not cut corns or calluses or try to remove them with medicine.  If you find a minor scrape, cut, or break in the skin on your feet, keep it and the skin around it clean and dry. You may clean these areas with mild soap and water. Do not clean the area with peroxide, alcohol, or iodine.  If you have a wound, scrape,  corn, or callus on your foot, look at it several times a day to make sure it is healing and not infected. Check for: ? Redness, swelling, or pain. ? Fluid or blood. ? Warmth. ? Pus or a bad smell. General instructions  Do not cross your legs. This may decrease blood flow to your feet.  Do not use heating pads or hot water bottles on your feet. They may burn your skin. If you have lost feeling in your feet or legs, you may not know this is happening until it is too late.  Protect your feet from hot and cold by wearing shoes, such as at the beach or on hot pavement.  Schedule a complete foot exam at least once a year (annually) or more often if you have foot problems. If you have  foot problems, report any cuts, sores, or bruises to your health care provider immediately. Contact a health care provider if:  You have a medical condition that increases your risk of infection and you have any cuts, sores, or bruises on your feet.  You have an injury that is not healing.  You have redness on your legs or feet.  You feel burning or tingling in your legs or feet.  You have pain or cramps in your legs and feet.  Your legs or feet are numb.  Your feet always feel cold.  You have pain around a toenail. Get help right away if:  You have a wound, scrape, corn, or callus on your foot and: ? You have pain, swelling, or redness that gets worse. ? You have fluid or blood coming from the wound, scrape, corn, or callus. ? Your wound, scrape, corn, or callus feels warm to the touch. ? You have pus or a bad smell coming from the wound, scrape, corn, or callus. ? You have a fever. ? You have a red line going up your leg. Summary  Check your feet every day for cuts, sores, red spots, swelling, and blisters.  Moisturize feet and legs daily.  Wear shoes that fit properly and have enough cushioning.  If you have foot problems, report any cuts, sores, or bruises to your health care provider immediately.  Schedule a complete foot exam at least once a year (annually) or more often if you have foot problems. This information is not intended to replace advice given to you by your health care provider. Make sure you discuss any questions you have with your health care provider. Document Revised: 12/27/2018 Document Reviewed: 05/07/2016 Elsevier Patient Education  [place].

## 2019-10-11 ENCOUNTER — Ambulatory Visit (INDEPENDENT_AMBULATORY_CARE_PROVIDER_SITE_OTHER): Payer: Medicare Other | Admitting: Psychology

## 2019-10-11 DIAGNOSIS — F251 Schizoaffective disorder, depressive type: Secondary | ICD-10-CM

## 2019-10-13 NOTE — Progress Notes (Signed)
Subjective: Jeanne [name] presents today at risk footcare. Patient has h/o diabetes, neuropathy and PAD and is seen for  and painful callus(es) b/l feet and painful mycotic toenails b/l that are difficult to trim. Pain interferes with ambulation. Aggravating factors include wearing enclosed shoe gear. Pain is relieved with periodic professional debridement.  Velna Hatchet, MD is patient's PCP. Last visit was: 08/20/2019.  Past Medical History:  Diagnosis Date  . Antiphospholipid antibody syndrome ([place])   . Antiphospholipid antibody syndrome ([place])   . Anxiety   . Arthritis   . Bilateral swelling of feet   . Carpal tunnel syndrome 01/02/2014   Bilateral  . Clotting disorder ([place])   . Constipation   . Depression   . Diabetes mellitus type 2, insulin dependent ([place]) 02/26/2019  . Diabetes mellitus, type II ([place])   . Difficult airway for intubation 03/13/2019  . Difficult intubation 07/09/2013   Glidescope used, see Anesthesia note  . DVT (deep venous thrombosis) ([place]) 2014   LEFT LEG  . Gastritis   . Glaucoma   . Heart murmur   . History of blood clots   . Hyperlipidemia   . Hypertension   . Hypokalemia 03/05/2013  . Kidney disease    stage ll  . Neuromuscular disorder ([place])    " NEUROPATHY IN  MY HANDS"  . Obsessive-compulsive disorder   . Osteoarthritis   . Rheumatoid arthritis ([place])   . Rheumatoid arthritis ([place]) 2016  . Schizo-affective psychosis ([place])   . Schizoaffective disorder ([place])   . Scleritis of both eyes   . Sleep apnea    Bipap  . Suppurative hidradenitis   . Undifferentiated connective tissue disease ([place])      Current Outpatient Medications on File Prior to Visit  Medication Sig Dispense Refill  . amLODipine (NORVASC) 5 MG tablet Take 5 mg by mouth every morning.     . ARIPiprazole (ABILIFY) 30 MG tablet Take 1 tablet (30 mg total) by mouth daily. 90 tablet 1  . aspirin EC 81 MG tablet Take 162 mg by mouth daily.    [name] atorvastatin (LIPITOR) 20 MG  tablet Take 1 tablet by mouth daily.    [name] buPROPion (WELLBUTRIN XL) 300 MG 24 hr tablet TAKE 1 TABLET(300 MG) BY MOUTH EVERY MORNING 90 tablet 1  . CHANTIX CONTINUING MONTH PAK 1 MG tablet See admin instructions. follow package directions    . DULoxetine (CYMBALTA) 30 MG capsule Take 3 capsules (90 mg total) by mouth daily. 270 capsule 1  . esomeprazole (NEXIUM) 40 MG capsule Take 40 mg by mouth daily before breakfast.    . etanercept (ENBREL SURECLICK) 50 MG/ML injection Inject 50 mg into the skin once a week. Tuesday    . furosemide (LASIX) 40 MG tablet Take 40 mg by mouth.    [name] glucosamine-chondroitin 500-400 MG tablet Take 2 tablets by mouth every morning.    [name] JARDIANCE 25 MG TABS tablet Take 20 mg by mouth daily.     Javier Docker Oil 300 MG CAPS Take 300 mg by mouth daily.    [name] levobunolol (BETAGAN) 0.5 % ophthalmic solution Place 1 drop into both eyes every morning.     . linaclotide (LINZESS) 72 MCG capsule Take 1 capsule (72 mcg total) by mouth daily before breakfast. 30 capsule 1  . metFORMIN (GLUCOPHAGE) 1000 MG tablet Take 1,000 mg by mouth daily with breakfast.     . Multiple Vitamin (MULTIVITAMIN WITH MINERALS) TABS tablet Take 1 tablet by mouth daily.    [name]  NIFEdipine (PROCARDIA-XL/ADALAT CC) 60 MG 24 hr tablet Take 1 tablet (60 mg total) by mouth daily. 30 tablet 1  . oxyCODONE-acetaminophen (PERCOCET/ROXICET) 5-325 MG tablet Take 1-2 tablets by mouth every 4 (four) hours as needed for severe pain.    [name] PRESCRIPTION MEDICATION Inhale 1 application into the lungs at bedtime. BiPAP setting 8/12    . QUEtiapine (SEROQUEL) 300 MG tablet Take 2 tablets (600 mg total) by mouth at bedtime. 180 tablet 1  . Turmeric POWD Take 1,500 mcg by mouth daily at 12 noon.     . verapamil (CALAN-SR) 180 MG CR tablet Take 1 tablet (180 mg total) by mouth daily. 30 tablet 1  . Vitamin D, Ergocalciferol, (DRISDOL) 1.25 MG (50000 UNIT) CAPS capsule Take 1 capsule (50,000 Units total) by mouth every 7 (seven)  days. 4 capsule 0   Current Facility-Administered Medications on File Prior to Visit  Medication Dose Route Frequency Provider Last Rate Last Admin  . 0.9 %  sodium chloride infusion  500 mL Intravenous Once Doran Stabler, MD         Allergies  Allergen Reactions  . Ace Inhibitors Anaphylaxis  . Arava [Leflunomide] Other (See Comments)    Nephritis and kidney clearance issues  . Travatan [Travoprost] Other (See Comments)    Red eyes, irritation     Objective: Jeanne [name] is a pleasant 59 y.o. African American female WD, WN in NAD. AAO x 3.  Vitals:   10/05/19 0834  Temp: (!) 96.7 F (35.9 C)   Vascular Examination: Neurovascular status unchanged b/l lower extremities. Capillary fill time to digits <3 seconds b/l lower extremities. Faintly palpable DP pulse(s) b/l lower extremities. Nonpalpable PT pulse(s) b/l lower extremities. Pedal hair present. Lower extremity skin temperature gradient within normal limits. Varicosities present b/l.  Dermatological Examination: Pedal skin with normal turgor, texture and tone bilaterally. No open wounds bilaterally. No interdigital macerations bilaterally. Toenails 1-5 b/l elongated, discolored, dystrophic, thickened, crumbly with subungual debris and tenderness to dorsal palpation. Hyperkeratotic lesion(s) L hallux, R hallux and submet head 1 left foot.  No erythema, no edema, no drainage, no flocculence.  Musculoskeletal: Normal muscle strength 5/5 to all lower extremity muscle groups bilaterally. No pain crepitus or joint limitation noted with ROM b/l. Hammertoes noted to the b/l lower extremities. Pes planus deformity noted b/l.  Utilizes cane for ambulation assistance.  Neurological Examination: Protective sensation intact 5/5 intact bilaterally with 10g monofilament b/l.  Last A1c: Hemoglobin A1C Latest Ref Rng & Units 02/26/2019  HGBA1C 4.8 - 5.6 % 6.3(H)  Some recent data might be hidden    Assessment: 1. Pain due to  onychomycosis of toenails of both feet   2. Callus   3. PVD (peripheral vascular disease) ([place])   4. Diabetic mononeuropathy associated with type 2 diabetes mellitus ([place])   Plan: -Examined patient. -No new findings. No new orders. -Continue diabetic foot care principles. -Toenails 1-5 b/l were debrided in length and girth with sterile nail nippers and dremel without iatrogenic bleeding.  -Callus(es) L hallux, R hallux and submet head 1 left foot pared utilizing sterile scalpel blade without complication or incident. Total number debrided =3. -Patient to report any pedal injuries to medical professional immediately. -Patient to continue soft, supportive shoe gear daily. -Patient/POA to call should there be question/concern in the interim.  Return in about 3 months (around 01/05/2020) for diabetic nail trim.  Marzetta Board, DPM

## 2019-10-15 ENCOUNTER — Encounter (INDEPENDENT_AMBULATORY_CARE_PROVIDER_SITE_OTHER): Payer: Self-pay | Admitting: Family Medicine

## 2019-10-15 ENCOUNTER — Ambulatory Visit (INDEPENDENT_AMBULATORY_CARE_PROVIDER_SITE_OTHER): Payer: Medicare Other | Admitting: Family Medicine

## 2019-10-15 ENCOUNTER — Other Ambulatory Visit: Payer: Self-pay

## 2019-10-15 VITALS — BP 127/75 | HR 66 | Temp 98.5°F | Ht 69.0 in | Wt 255.0 lb

## 2019-10-15 DIAGNOSIS — G478 Other sleep disorders: Secondary | ICD-10-CM | POA: Diagnosis not present

## 2019-10-15 DIAGNOSIS — K5909 Other constipation: Secondary | ICD-10-CM

## 2019-10-15 DIAGNOSIS — Z6837 Body mass index (BMI) 37.0-37.9, adult: Secondary | ICD-10-CM | POA: Diagnosis not present

## 2019-10-15 NOTE — Progress Notes (Signed)
Chief Complaint:   OBESITY Jeanne [name] is here to discuss her progress with her obesity treatment plan along with follow-up of her obesity related diagnoses. Jeanne [name] is on the Category 2 Plan and states she is following her eating plan approximately 30% of the time. Jeanne [name] states she is walking for 45 minutes 3 times per week and going to the pool for 90 minutes 5 times per week.  Today's visit was #: 12 Starting weight: 281 lbs Starting date: 02/26/2019 Today's weight: 255 lbs Today's date: 10/15/2019 Total lbs lost to date: 26 lbs Total lbs lost since last in-office visit: 2 lbs  Interim History: Jeanne [name] is down 26 pounds today.  She says that she has a craving for sugar between 7 pm and 10 pm.  Denies hunger.  Assessment/Plan:   1. Other constipation Jeanne [name] notes constipation, but it is improving on Linzess.  Counseling Getting to Good Bowel Health: Your goal is to have one soft bowel movement each day. Drink at least 8 glasses of water each day. Eat plenty of fiber (goal is over 25 grams each day). It is best to get most of your fiber from dietary sources which includes leafy green vegetables, fresh fruit, and whole grains. You may need to add fiber with the help of OTC fiber supplements. These include Metamucil, Citrucel, and Flaxseed. If you are still having trouble, try adding Miralax or Magnesium Citrate. If all of these changes do not work, Cabin crew.  2. Nocturnal sleep-related eating disorder Discussed topiramate.  She will get Psychiatry approval first.  3. Class 2 severe obesity with serious comorbidity and body mass index (BMI) of 37.0 to 37.9 in adult, unspecified obesity type (HCC) Jeanne [name] is currently in the action stage of change. As such, her goal is to continue with weight loss efforts. She has agreed to the Category 2 Plan.   Exercise goals: For substantial health benefits, adults should do at least 150 minutes (2 hours and 30 minutes) a  week of moderate-intensity, or 75 minutes (1 hour and 15 minutes) a week of vigorous-intensity aerobic physical activity, or an equivalent combination of moderate- and vigorous-intensity aerobic activity. Aerobic activity should be performed in episodes of at least 10 minutes, and preferably, it should be spread throughout the week.  Behavioral modification strategies: increasing lean protein intake.  Jeanne [name] has agreed to follow-up with our clinic in 3 weeks. She was informed of the importance of frequent follow-up visits to maximize her success with intensive lifestyle modifications for her multiple health conditions.   Objective:   Blood pressure 127/75, pulse 66, temperature 98.5 F (36.9 C), temperature source Oral, height 5\' 9"  (1.753 m), weight 255 lb (115.7 kg), SpO2 98 %. Body mass index is 37.66 kg/m.  General: Cooperative, alert, well developed, in no acute distress. HEENT: Conjunctivae and lids unremarkable. Cardiovascular: Regular rhythm.  Lungs: Normal work of breathing. Neurologic: No focal deficits.   Lab Results  Component Value Date   CREATININE 1.27 (H) 02/26/2019   BUN 20 02/26/2019   NA 138 02/26/2019   K 4.3 02/26/2019   CL 99 02/26/2019   CO2 27 02/26/2019   Lab Results  Component Value Date   ALT 29 02/26/2019   AST 20 02/26/2019   ALKPHOS 118 (H) 02/26/2019   BILITOT 0.4 02/26/2019   Lab Results  Component Value Date   HGBA1C 6.3 (H) 02/26/2019   Lab Results  Component Value Date   INSULIN 21.0 02/26/2019   Lab Results  Component Value Date   TSH 0.845 02/26/2019   Lab Results  Component Value Date   CHOL 173 02/26/2019   HDL 69 02/26/2019   LDLCALC 81 02/26/2019   TRIG 132 02/26/2019   Lab Results  Component Value Date   WBC 5.1 02/26/2019   HGB 15.5 02/26/2019   HCT 44.1 02/26/2019   MCV 93 02/26/2019   PLT 206 02/26/2019   Obesity Behavioral Intervention:   Approximately 15 minutes were spent on the discussion  below.  ASK: We discussed the diagnosis of obesity with Jeanne [name] today and Jeanne [name] agreed to give [name] permission to discuss obesity behavioral modification therapy today.  ASSESS: Jeanne [name] has the diagnosis of obesity and her BMI today is 37.7. Jeanne [name] is in the action stage of change.   ADVISE: Jeanne [name] was educated on the multiple health risks of obesity as well as the benefit of weight loss to improve her health. She was advised of the need for long term treatment and the importance of lifestyle modifications to improve her current health and to decrease her risk of future health problems.  AGREE: Multiple dietary modification options and treatment options were discussed and Jeanne [name] agreed to follow the recommendations documented in the above note.  ARRANGE: Jeanne [name] was educated on the importance of frequent visits to treat obesity as outlined per CMS and USPSTF guidelines and agreed to schedule her next follow up appointment today.  Attestation Statements:   Reviewed by clinician on day of visit: allergies, medications, problem list, medical history, surgical history, family history, social history, and previous encounter notes.  I, Water quality scientist, CMA, am acting as transcriptionist for [name], DO  I have reviewed the above documentation for accuracy and completeness, and I agree with the above. [name], DO

## 2019-10-17 ENCOUNTER — Telehealth (HOSPITAL_COMMUNITY): Payer: Self-pay | Admitting: *Deleted

## 2019-10-17 NOTE — Telephone Encounter (Signed)
Pt called per her weight management doc to check with you if it is safe to start Topomax with the regime she's on. Please review and advise.

## 2019-10-25 NOTE — Telephone Encounter (Signed)
It may causes excessive sedation or fatigue in combination with Seroquel. Have the patient monitor especially due to age.

## 2019-11-08 ENCOUNTER — Ambulatory Visit (INDEPENDENT_AMBULATORY_CARE_PROVIDER_SITE_OTHER): Payer: Medicare Other | Admitting: Family Medicine

## 2019-11-11 ENCOUNTER — Encounter (INDEPENDENT_AMBULATORY_CARE_PROVIDER_SITE_OTHER): Payer: Self-pay

## 2019-11-12 ENCOUNTER — Ambulatory Visit (INDEPENDENT_AMBULATORY_CARE_PROVIDER_SITE_OTHER): Payer: Medicare Other | Admitting: Family Medicine

## 2019-11-20 ENCOUNTER — Ambulatory Visit (INDEPENDENT_AMBULATORY_CARE_PROVIDER_SITE_OTHER): Payer: Medicare Other | Admitting: Psychology

## 2019-11-20 ENCOUNTER — Other Ambulatory Visit (INDEPENDENT_AMBULATORY_CARE_PROVIDER_SITE_OTHER): Payer: Self-pay | Admitting: Family Medicine

## 2019-11-20 DIAGNOSIS — E559 Vitamin D deficiency, unspecified: Secondary | ICD-10-CM

## 2019-11-20 DIAGNOSIS — F251 Schizoaffective disorder, depressive type: Secondary | ICD-10-CM | POA: Diagnosis not present

## 2019-11-21 ENCOUNTER — Encounter (INDEPENDENT_AMBULATORY_CARE_PROVIDER_SITE_OTHER): Payer: Self-pay

## 2019-11-21 NOTE — Telephone Encounter (Signed)
My chart message sent to pt.

## 2019-12-18 ENCOUNTER — Encounter (INDEPENDENT_AMBULATORY_CARE_PROVIDER_SITE_OTHER): Payer: Self-pay | Admitting: Family Medicine

## 2019-12-18 ENCOUNTER — Ambulatory Visit (INDEPENDENT_AMBULATORY_CARE_PROVIDER_SITE_OTHER): Payer: Medicare Other | Admitting: Psychology

## 2019-12-18 ENCOUNTER — Ambulatory Visit (INDEPENDENT_AMBULATORY_CARE_PROVIDER_SITE_OTHER): Payer: Medicare Other | Admitting: Family Medicine

## 2019-12-18 ENCOUNTER — Other Ambulatory Visit: Payer: Self-pay

## 2019-12-18 VITALS — BP 113/72 | HR 72 | Temp 98.3°F | Ht 69.0 in | Wt 265.0 lb

## 2019-12-18 DIAGNOSIS — E1169 Type 2 diabetes mellitus with other specified complication: Secondary | ICD-10-CM | POA: Diagnosis not present

## 2019-12-18 DIAGNOSIS — Z6839 Body mass index (BMI) 39.0-39.9, adult: Secondary | ICD-10-CM

## 2019-12-18 DIAGNOSIS — E559 Vitamin D deficiency, unspecified: Secondary | ICD-10-CM

## 2019-12-18 DIAGNOSIS — F251 Schizoaffective disorder, depressive type: Secondary | ICD-10-CM | POA: Diagnosis not present

## 2019-12-18 MED ORDER — VITAMIN D (ERGOCALCIFEROL) 1.25 MG (50000 UNIT) PO CAPS: ORAL_CAPSULE | ORAL | 0 refills | Status: DC

## 2019-12-18 NOTE — Progress Notes (Signed)
Chief Complaint:   OBESITY Jeanne [name] is here to discuss her progress with her obesity treatment plan along with follow-up of her obesity related diagnoses. Jeanne [name] is on the Category 2 Plan and states she is following her eating plan approximately 0% of the time. Jeanne [name] states she is walking for 60 minutes 3 times per week.  Today's visit was #: 16 Starting weight: 281 lbs Starting date: 02/26/2019 Today's weight: 265 lbs Today's date: 12/18/2019 Total lbs lost to date: 16 lbs Total lbs lost since last in-office visit: 0 Total weight loss percentage to date: -5.69%  Interim History: Jeanne [name] says she fell off the wagon for 4-6 weeks.  She has been back on for the past 2 weeks.  She is back to swimming and eating well.  She had a visit with her counselor, Jeanne [name] today.   Assessment/Plan:   1. Vitamin D deficiency Current vitamin D is 24.2, tested on 02/26/2019. Not at goal. Optimal goal > 50 ng/dL. There is also evidence to support a goal of >70 ng/dL in patients with cancer and heart disease. Plan: Continue Vitamin D @50 ,000 IU every week with follow-up for routine testing of Vitamin D at least 2-3 times per year to avoid over-replacement.  - Refill Vitamin D, Ergocalciferol, (DRISDOL) 1.25 MG (50000 UNIT) CAPS capsule; TAKE 1 CAPSULE BY MOUTH EVERY 7 DAYS  Dispense: 4 capsule; Refill: 0  2. Type 2 diabetes mellitus with other specified complication, without long-term current use of insulin (HCC) Diabetes Mellitus: stable Issues reviewed with her: blood sugar goals, complications of diabetes mellitus, hypoglycemia prevention and treatment, exercise, nutrition, and carbohydrate counting.   Lab Results  Component Value Date   HGBA1C 6.3 (H) 02/26/2019   Lab Results  Component Value Date   LDLCALC 81 02/26/2019   CREATININE 1.27 (H) 02/26/2019   Health Maintenance BP goal < 130/80, LDL goal of < 100 mg/dl, HDL > 40 and TG < 150. All diabetic should be on a statin unless  contraindication. Dietary recommendations: < 100 g carbohydrates daily. Physical activity recommendations: as below.  3. Class 2 severe obesity with serious comorbidity and body mass index (BMI) of 39.0 to 39.9 in adult, unspecified obesity type (HCC) Jeanne [name] is currently in the action stage of change. As such, her goal is to continue with weight loss efforts. She has agreed to the Category 2 Plan.   Exercise goals: For substantial health benefits, adults should do at least 150 minutes (2 hours and 30 minutes) a week of moderate-intensity, or 75 minutes (1 hour and 15 minutes) a week of vigorous-intensity aerobic physical activity, or an equivalent combination of moderate- and vigorous-intensity aerobic activity. Aerobic activity should be performed in episodes of at least 10 minutes, and preferably, it should be spread throughout the week.  Behavioral modification strategies: increasing lean protein intake.  Jeanne [name] has agreed to follow-up with our clinic in 3 weeks. She was informed of the importance of frequent follow-up visits to maximize her success with intensive lifestyle modifications for her multiple health conditions.   Objective:   Blood pressure 113/72, pulse 72, temperature 98.3 F (36.8 C), temperature source Oral, height 5\' 9"  (1.753 m), weight 265 lb (120.2 kg), SpO2 98 %. Body mass index is 39.13 kg/m.  General: Cooperative, alert, well developed, in no acute distress. HEENT: Conjunctivae and lids unremarkable. Cardiovascular: Regular rhythm.  Lungs: Normal work of breathing. Neurologic: No focal deficits.   Lab Results  Component Value Date   CREATININE 1.27 (H) 02/26/2019  BUN 20 02/26/2019   NA 138 02/26/2019   K 4.3 02/26/2019   CL 99 02/26/2019   CO2 27 02/26/2019   Lab Results  Component Value Date   ALT 29 02/26/2019   AST 20 02/26/2019   ALKPHOS 118 (H) 02/26/2019   BILITOT 0.4 02/26/2019   Lab Results  Component Value Date   HGBA1C 6.3 (H)  02/26/2019   Lab Results  Component Value Date   INSULIN 21.0 02/26/2019   Lab Results  Component Value Date   TSH 0.845 02/26/2019   Lab Results  Component Value Date   CHOL 173 02/26/2019   HDL 69 02/26/2019   LDLCALC 81 02/26/2019   TRIG 132 02/26/2019   Lab Results  Component Value Date   WBC 5.1 02/26/2019   HGB 15.5 02/26/2019   HCT 44.1 02/26/2019   MCV 93 02/26/2019   PLT 206 02/26/2019   Obesity Behavioral Intervention:   Approximately 15 minutes were spent on the discussion below.  ASK: We discussed the diagnosis of obesity with Jeanne [name] today and Jeanne [name] agreed to give [name] permission to discuss obesity behavioral modification therapy today.  ASSESS: Jeanne [name] has the diagnosis of obesity and her BMI today is 39.3. Jeanne [name] is in the action stage of change.   ADVISE: Jeanne [name] was educated on the multiple health risks of obesity as well as the benefit of weight loss to improve her health. She was advised of the need for long term treatment and the importance of lifestyle modifications to improve her current health and to decrease her risk of future health problems.  AGREE: Multiple dietary modification options and treatment options were discussed and Jeanne [name] agreed to follow the recommendations documented in the above note.  ARRANGE: Jeanne [name] was educated on the importance of frequent visits to treat obesity as outlined per CMS and USPSTF guidelines and agreed to schedule her next follow up appointment today.  Attestation Statements:   Reviewed by clinician on day of visit: allergies, medications, problem list, medical history, surgical history, family history, social history, and previous encounter notes.  I, Water quality scientist, CMA, am acting as transcriptionist for [name], DO  I have reviewed the above documentation for accuracy and completeness, and I agree with the above. [name], DO

## 2019-12-24 DIAGNOSIS — Z6839 Body mass index (BMI) 39.0-39.9, adult: Secondary | ICD-10-CM | POA: Insufficient documentation

## 2020-01-02 ENCOUNTER — Ambulatory Visit (INDEPENDENT_AMBULATORY_CARE_PROVIDER_SITE_OTHER): Payer: Medicare Other | Admitting: Family Medicine

## 2020-01-02 ENCOUNTER — Encounter (INDEPENDENT_AMBULATORY_CARE_PROVIDER_SITE_OTHER): Payer: Self-pay | Admitting: Family Medicine

## 2020-01-02 ENCOUNTER — Other Ambulatory Visit: Payer: Self-pay

## 2020-01-02 VITALS — BP 122/74 | HR 63 | Temp 98.2°F | Ht 69.0 in | Wt 267.0 lb

## 2020-01-02 DIAGNOSIS — K5909 Other constipation: Secondary | ICD-10-CM | POA: Diagnosis not present

## 2020-01-02 DIAGNOSIS — E1159 Type 2 diabetes mellitus with other circulatory complications: Secondary | ICD-10-CM

## 2020-01-02 DIAGNOSIS — E1169 Type 2 diabetes mellitus with other specified complication: Secondary | ICD-10-CM

## 2020-01-02 DIAGNOSIS — I152 Hypertension secondary to endocrine disorders: Secondary | ICD-10-CM

## 2020-01-02 DIAGNOSIS — E559 Vitamin D deficiency, unspecified: Secondary | ICD-10-CM

## 2020-01-02 DIAGNOSIS — Z6839 Body mass index (BMI) 39.0-39.9, adult: Secondary | ICD-10-CM

## 2020-01-02 DIAGNOSIS — R635 Abnormal weight gain: Secondary | ICD-10-CM

## 2020-01-02 DIAGNOSIS — I10 Essential (primary) hypertension: Secondary | ICD-10-CM

## 2020-01-02 DIAGNOSIS — N1831 Chronic kidney disease, stage 3a: Secondary | ICD-10-CM

## 2020-01-02 DIAGNOSIS — E785 Hyperlipidemia, unspecified: Secondary | ICD-10-CM

## 2020-01-02 MED ORDER — OZEMPIC (0.25 OR 0.5 MG/DOSE) 2 MG/1.5ML ~~LOC~~ SOPN: 0.5000 mg | PEN_INJECTOR | SUBCUTANEOUS | 0 refills | Status: DC

## 2020-01-02 MED ORDER — INSULIN PEN NEEDLE 32G X 4 MM MISC: 1.0000 | 0 refills | Status: DC

## 2020-01-03 ENCOUNTER — Encounter (INDEPENDENT_AMBULATORY_CARE_PROVIDER_SITE_OTHER): Payer: Self-pay | Admitting: Family Medicine

## 2020-01-03 LAB — COMPREHENSIVE METABOLIC PANEL
ALT: 22 IU/L (ref 0–32)
AST: 13 IU/L (ref 0–40)
Albumin/Globulin Ratio: 1.4 (ref 1.2–2.2)
Albumin: 4.3 g/dL (ref 3.8–4.9)
Alkaline Phosphatase: 131 IU/L — ABNORMAL HIGH (ref 44–121)
BUN/Creatinine Ratio: 14 (ref 9–23)
BUN: 17 mg/dL (ref 6–24)
Bilirubin Total: 0.4 mg/dL (ref 0.0–1.2)
CO2: 27 mmol/L (ref 20–29)
Calcium: 9.5 mg/dL (ref 8.7–10.2)
Chloride: 102 mmol/L (ref 96–106)
Creatinine, Ser: 1.18 mg/dL — ABNORMAL HIGH (ref 0.57–1.00)
GFR calc Af Amer: 58 mL/min/{1.73_m2} — ABNORMAL LOW (ref >59)
GFR calc non Af Amer: 51 mL/min/{1.73_m2} — ABNORMAL LOW (ref >59)
Globulin, Total: 3.1 g/dL (ref 1.5–4.5)
Glucose: 85 mg/dL (ref 65–99)
Potassium: 4.5 mmol/L (ref 3.5–5.2)
Sodium: 141 mmol/L (ref 134–144)
Total Protein: 7.4 g/dL (ref 6.0–8.5)

## 2020-01-03 LAB — CBC WITH DIFFERENTIAL/PLATELET
Basophils Absolute: 0 10*3/uL (ref 0.0–0.2)
Basos: 0 %
EOS (ABSOLUTE): 0.1 10*3/uL (ref 0.0–0.4)
Eos: 2 %
Hematocrit: 42.9 % (ref 34.0–46.6)
Hemoglobin: 14.9 g/dL (ref 11.1–15.9)
Immature Grans (Abs): 0 10*3/uL (ref 0.0–0.1)
Immature Granulocytes: 0 %
Lymphocytes Absolute: 2.3 10*3/uL (ref 0.7–3.1)
Lymphs: 47 %
MCH: 33.1 pg — ABNORMAL HIGH (ref 26.6–33.0)
MCHC: 34.7 g/dL (ref 31.5–35.7)
MCV: 95 fL (ref 79–97)
Monocytes Absolute: 0.5 10*3/uL (ref 0.1–0.9)
Monocytes: 10 %
Neutrophils Absolute: 2 10*3/uL (ref 1.4–7.0)
Neutrophils: 41 %
Platelets: 172 10*3/uL (ref 150–450)
RBC: 4.5 x10E6/uL (ref 3.77–5.28)
RDW: 13.1 % (ref 11.7–15.4)
WBC: 4.8 10*3/uL (ref 3.4–10.8)

## 2020-01-03 LAB — VITAMIN D 25 HYDROXY (VIT D DEFICIENCY, FRACTURES): Vit D, 25-Hydroxy: 29.7 ng/mL — ABNORMAL LOW (ref 30.0–100.0)

## 2020-01-03 LAB — LIPID PANEL
Chol/HDL Ratio: 2.4 ratio (ref 0.0–4.4)
Cholesterol, Total: 178 mg/dL (ref 100–199)
HDL: 74 mg/dL (ref >39)
LDL Chol Calc (NIH): 90 mg/dL (ref 0–99)
Triglycerides: 78 mg/dL (ref 0–149)
VLDL Cholesterol Cal: 14 mg/dL (ref 5–40)

## 2020-01-03 LAB — HEMOGLOBIN A1C
Est. average glucose Bld gHb Est-mCnc: 134 mg/dL
Hgb A1c MFr Bld: 6.3 % — ABNORMAL HIGH (ref 4.8–5.6)

## 2020-01-03 LAB — TSH: TSH: 0.783 u[IU]/mL (ref 0.450–4.500)

## 2020-01-06 NOTE — Progress Notes (Signed)
Chief Complaint:   OBESITY Jeanne [name] is here to discuss her progress with her obesity treatment plan along with follow-up of her obesity related diagnoses. Jeanne [name] is on the Category 2 Plan and states she is following her eating plan approximately 40% of the time. Jeanne [name] states she is doing water aerobics and/or walking for 60 minutes 5 times per week.  Today's visit was #: 14 Starting weight: 281 lbs Starting date: 02/26/2019 Today's weight: 267 lbs Today's date: 01/02/2020 Total lbs lost to date: 14 lbs Total lbs lost since last in-office visit: 0 Total weight loss percentage to date: -4.98%  Interim History:  Today's bioimpedance results indicate that Jeanne [name] has gained 5 pounds of water weight since her last visit.  She is averaging ~1300 calories per day and is exercising 1-2 hours per day.  She says she is drinking 1-2 liters of water per day.  She feels that her clothes are fitting better. Challenges:  Still with polyphagia at night.  Assessment/Plan:   1. Hypertension associated with diabetes ([place]) At goal. Medications: Norvasc and Procardia. The current medical regimen is effective;  continue present plan and medications.  BP Readings from Last 3 Encounters:  01/02/20 122/74  12/18/19 113/72  10/15/19 127/75   2. Type 2 diabetes mellitus with other specified complication, without long-term current use of insulin (HCC) Well controlled, but we will add Semaglutide for better control of polyphagia. Hold metformin. Decrease Jardiance if low blood sugar.  Lab Results  Component Value Date   HGBA1C 6.3 (H) 01/02/2020   HGBA1C 6.3 (H) 02/26/2019   Lab Results  Component Value Date   LDLCALC 90 01/02/2020   CREATININE 1.18 (H) 01/02/2020   Lab Results  Component Value Date   INSULIN 21.0 02/26/2019   - CBC with Differential/Platelet - Comprehensive metabolic panel - Hemoglobin A1c - Semaglutide,0.25 or 0.5MG /DOS, (OZEMPIC, 0.25 OR 0.5 MG/DOSE,) 2 MG/1.5ML  SOPN; Inject 0.5 mg into the skin once a week.  Dispense: 1.5 mL; Refill: 0 - Insulin Pen Needle 32G X 4 MM MISC; 1 each by Does not apply route once a week.  Dispense: 50 each; Refill: 0  3. Vitamin D deficiency Current vitamin D is 24.2, tested on 02/26/2019. Not at goal. Optimal goal > 50 ng/dL. There is also evidence to support a goal of >70 ng/dL in patients with cancer and heart disease. Plan: Continue Vitamin D @50 ,000 IU every week with follow-up for routine testing of Vitamin D at least 2-3 times per year to avoid over-replacement.  - VITAMIN D 25 Hydroxy (Vit-D Deficiency, Fractures)  4. Chronic constipation Jeanne [name] was informed that a decrease in bowel movement frequency is normal while losing weight, but stools should not be hard or painful. Continue current bowel regimen. Be careful while advancing Ozempic.   Counseling Getting to Good Bowel Health: Your goal is to have one soft bowel movement each day. Drink at least 8 glasses of water each day. Eat plenty of fiber (goal is over 25 grams each day). It is best to get most of your fiber from dietary sources which includes leafy green vegetables, fresh fruit, and whole grains. You may need to add fiber with the help of OTC fiber supplements. These include Metamucil, Citrucel, and Benefiber. If you are still having trouble, try adding Miralax or Magnesium Citrate. If all of these changes do not work, contact me.  5. Stage 3a chronic kidney disease Lab results and trends reviewed. Avoid nephrotoxic medications. We will DC metformin (though  still in acceptable range for CKD) while advancing Semaglutide.   Lab Results  Component Value Date   CREATININE 1.18 (H) 01/02/2020   CREATININE 1.27 (H) 02/26/2019   CREATININE 1.18 (H) 02/01/2018   Lab Results  Component Value Date   CREATININE 1.18 (H) 01/02/2020   BUN 17 01/02/2020   NA 141 01/02/2020   K 4.5 01/02/2020   CL 102 01/02/2020   CO2 27 01/02/2020   - CBC with  Differential/Platelet  6. Hyperlipidemia associated with type 2 diabetes mellitus ([place]) Cardiovascular risk and specific lipid/LDL goals reviewed.  Continue statin.   Lab Results  Component Value Date   ALT 22 01/02/2020   AST 13 01/02/2020   ALKPHOS 131 (H) 01/02/2020   BILITOT 0.4 01/02/2020   Lab Results  Component Value Date   CHOL 178 01/02/2020   HDL 74 01/02/2020   LDLCALC 90 01/02/2020   TRIG 78 01/02/2020   CHOLHDL 2.4 01/02/2020   - Lipid panel  7. Abnormal weight gain Will check labs today.  - TSH  8. Class 2 severe obesity with serious comorbidity and body mass index (BMI) of 39.0 to 39.9 in adult, unspecified obesity type (HCC) Jeanne [name] is currently in the action stage of change. As such, her goal is to continue with weight loss efforts. She has agreed to the Category 2 Plan.   Exercise goals: For substantial health benefits, adults should do at least 150 minutes (2 hours and 30 minutes) a week of moderate-intensity, or 75 minutes (1 hour and 15 minutes) a week of vigorous-intensity aerobic physical activity, or an equivalent combination of moderate- and vigorous-intensity aerobic activity. Aerobic activity should be performed in episodes of at least 10 minutes, and preferably, it should be spread throughout the week.  Behavioral modification strategies: increasing lean protein intake and decreasing sodium intake.  Jeanne [name] has agreed to follow-up with our clinic in 2-3 weeks. She was informed of the importance of frequent follow-up visits to maximize her success with intensive lifestyle modifications for her multiple health conditions.   Jeanne [name] was informed we would discuss her lab results at her next visit unless there is a critical issue that needs to be addressed sooner. Jeanne [name] agreed to keep her next visit at the agreed upon time to discuss these results.  Objective:   Blood pressure 122/74, pulse 63, temperature 98.2 F (36.8 C), temperature source  Oral, height 5\' 9"  (1.753 m), weight 267 lb (121.1 kg), SpO2 97 %. Body mass index is 39.43 kg/m.  General: Cooperative, alert, well developed, in no acute distress. HEENT: Conjunctivae and lids unremarkable. Cardiovascular: Regular rhythm.  Lungs: Normal work of breathing. Neurologic: No focal deficits.   Lab Results  Component Value Date   CREATININE 1.18 (H) 01/02/2020   BUN 17 01/02/2020   NA 141 01/02/2020   K 4.5 01/02/2020   CL 102 01/02/2020   CO2 27 01/02/2020   Lab Results  Component Value Date   ALT 22 01/02/2020   AST 13 01/02/2020   ALKPHOS 131 (H) 01/02/2020   BILITOT 0.4 01/02/2020   Lab Results  Component Value Date   HGBA1C 6.3 (H) 01/02/2020   HGBA1C 6.3 (H) 02/26/2019   Lab Results  Component Value Date   INSULIN 21.0 02/26/2019   Lab Results  Component Value Date   TSH 0.783 01/02/2020   Lab Results  Component Value Date   CHOL 178 01/02/2020   HDL 74 01/02/2020   LDLCALC 90 01/02/2020   TRIG 78 01/02/2020  CHOLHDL 2.4 01/02/2020   Lab Results  Component Value Date   WBC 4.8 01/02/2020   HGB 14.9 01/02/2020   HCT 42.9 01/02/2020   MCV 95 01/02/2020   PLT 172 01/02/2020   Obesity Behavioral Intervention:   Approximately 15 minutes were spent on the discussion below.  ASK: We discussed the diagnosis of obesity with Jeanne [name] today and Jeanne [name] agreed to give [name] permission to discuss obesity behavioral modification therapy today.  ASSESS: Jeanne [name] has the diagnosis of obesity and her BMI today is 39.5. Jeanne [name] is in the action stage of change.   ADVISE: Jeanne [name] was educated on the multiple health risks of obesity as well as the benefit of weight loss to improve her health. She was advised of the need for long term treatment and the importance of lifestyle modifications to improve her current health and to decrease her risk of future health problems.  AGREE: Multiple dietary modification options and treatment options were  discussed and Jeanne [name] agreed to follow the recommendations documented in the above note.  ARRANGE: Jeanne [name] was educated on the importance of frequent visits to treat obesity as outlined per CMS and USPSTF guidelines and agreed to schedule her next follow up appointment today.  Attestation Statements:   Reviewed by clinician on day of visit: allergies, medications, problem list, medical history, surgical history, family history, social history, and previous encounter notes.  I, Water quality scientist, CMA, am acting as transcriptionist for [name], DO  I have reviewed the above documentation for accuracy and completeness, and I agree with the above. [name], DO

## 2020-01-11 ENCOUNTER — Other Ambulatory Visit: Payer: Self-pay

## 2020-01-11 ENCOUNTER — Ambulatory Visit: Payer: Medicare Other | Admitting: Podiatry

## 2020-01-11 DIAGNOSIS — L84 Corns and callosities: Secondary | ICD-10-CM

## 2020-01-11 DIAGNOSIS — E119 Type 2 diabetes mellitus without complications: Secondary | ICD-10-CM

## 2020-01-11 DIAGNOSIS — E1142 Type 2 diabetes mellitus with diabetic polyneuropathy: Secondary | ICD-10-CM | POA: Diagnosis not present

## 2020-01-11 DIAGNOSIS — M79674 Pain in right toe(s): Secondary | ICD-10-CM | POA: Diagnosis not present

## 2020-01-11 DIAGNOSIS — M79675 Pain in left toe(s): Secondary | ICD-10-CM

## 2020-01-11 DIAGNOSIS — B351 Tinea unguium: Secondary | ICD-10-CM | POA: Diagnosis not present

## 2020-01-11 DIAGNOSIS — L600 Ingrowing nail: Secondary | ICD-10-CM

## 2020-01-11 NOTE — Patient Instructions (Signed)
EPSOM SALT FOOT SOAK INSTRUCTIONS  *IF YOU HAVE BEEN PRESCRIBED ANTIBIOTICS, TAKE AS INSTRUCTED UNTIL ALL ARE GONE*  Shopping List:  A. Plain epsom salt (not scented) B. Neosporin Cream/Ointment or Bacitracin Cream/Ointment (or prescribed antiobiotic drops/cream/ointment) C. 1-inch fabric band-aids  1.  Place 1/4 cup of epsom salts in 2 quarts of warm tap water. IF YOU ARE DIABETIC, OR HAVE NEUROPATHY, CHECK THE TEMPERATURE OF THE WATER WITH YOUR ELBOW.  2.  Submerge your foot/feet in the solution and soak for 10-15 minutes.      3.  Next, remove your foot/feet from solution, blot dry the affected area.    4.  Apply light amount of antibiotic cream/ointment and cover with fabric band-aid .  5.  This soak should be done once a day for 7 days.   6.  Monitor for any signs/symptoms of infection such as redness, swelling, odor, drainage, increased pain, or non-healing of digit.   7.  Please do not hesitate to call the office and speak to a Nurse or Doctor if you have questions.   8.  If you experience fever, chills, nightsweats, nausea or vomiting with worsening of digit, please go to the emergency room.   

## 2020-01-11 NOTE — Progress Notes (Signed)
Subjective: Jeanne [name] presents today for for annual diabetic foot examination, at risk foot care with history of diabetic neuropathy and painful thick toenails that are difficult to trim. Pain interferes with ambulation. Aggravating factors include wearing enclosed shoe gear. Pain is relieved with periodic professional debridement.  She admits 18 year h/o diabetes. Her blood sugar was 105 mg/dl on yesterday. She has not checked this morning. Last A1c was 6.3%.  HShe c/o chronic ingrown toenails of bilateral great toes. After speaking with her sister, she is interested in having both borders of both great toes permanently removed.  She has seen Vascular and had lower extremity studies performed in May.   Velna Hatchet, MD is patient's PCP. Last visit with Family Medicine was 01/02/2020.  Past Medical History:  Diagnosis Date  . Antiphospholipid antibody syndrome ([place])   . Antiphospholipid antibody syndrome ([place])   . Anxiety   . Arthritis   . Bilateral swelling of feet   . Carpal tunnel syndrome 01/02/2014   Bilateral  . Clotting disorder ([place])   . Constipation   . Depression   . Diabetes mellitus type 2, insulin dependent ([place]) 02/26/2019  . Diabetes mellitus, type II ([place])   . Difficult airway for intubation 03/13/2019  . Difficult intubation 07/09/2013   Glidescope used, see Anesthesia note  . DVT (deep venous thrombosis) ([place]) 2014   LEFT LEG  . Gastritis   . Glaucoma   . Heart murmur   . History of blood clots   . Hyperlipidemia   . Hypertension   . Hypokalemia 03/05/2013  . Kidney disease    stage ll  . Neuromuscular disorder ([place])    " NEUROPATHY IN  MY HANDS"  . Obsessive-compulsive disorder   . Osteoarthritis   . Rheumatoid arthritis ([facility])   . Rheumatoid arthritis ([place]) 2016  . Schizo-affective psychosis ([place])   . Schizoaffective disorder ([place])   . Scleritis of both eyes   . Sleep apnea    Bipap  . Suppurative hidradenitis   . 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 Class 2 severe obesity with serious comorbidity and body mass index (BMI) of 39.0 to 39.9 in adult ([place]) 12/24/2019  . Hyperlipidemia associated with type 2 diabetes mellitus ([place]) 06/04/2019  . Stage 3a chronic kidney disease 04/09/2019  . Vitamin D deficiency 04/09/2019  . Diabetes mellitus ([place]) 02/26/2019  . Chronic constipation, with Hx of ileus 02/26/2019  . Hypertension associated with diabetes ([place]) 02/26/2019  . Class 3 severe obesity with serious comorbidity and body mass index (BMI) of 40.0 to 44.9 in adult ([place]) 02/26/2019  . OCD (obsessive compulsive disorder) 04/10/2015  . Schizoaffective disorder, depressive type ([place]) 04/10/2015  . 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 DVT (deep venous thrombosis) ([place]) 03/22/2013  . Paresthesia 03/05/2013  . Obsessive compulsive disorder 03/05/2013  . Obstructive sleep apnea 03/05/2013  . Glaucoma 03/05/2013  . Connective tissue disorder ([place]) 03/05/2013    Past Surgical History:  Procedure Laterality Date  . BUNIONECTOMY    . CERVICAL CONIZATION W/BX    . COLONOSCOPY WITH PROPOFOL N/A 03/13/2019   Procedure: COLONOSCOPY WITH PROPOFOL;  Surgeon: Doran Stabler, MD;  Location: [facility];  Service: Gastroenterology;  Laterality: N/A;  . LAPAROSCOPIC ADRENALECTOMY Left 07/09/2013   Procedure: LAPAROSCOPIC LEFT  ADRENALECTOMY;  Surgeon: Earnstine Regal, MD;  Location: [facility] ORS;  Service: General;  Laterality: Left;  . POLYPECTOMY  03/13/2019   Procedure: POLYPECTOMY;  Surgeon: Doran Stabler, MD;  Location: [facility];  Service: Gastroenterology;;  . sweat gland removal  1975    Current Outpatient Medications on File Prior to Visit  Medication Sig Dispense Refill  . amLODipine (NORVASC) 5 MG tablet Take 5 mg by mouth every morning.     . ARIPiprazole (ABILIFY) 30 MG tablet Take 1  tablet (30 mg total) by mouth daily. 90 tablet 1  . aspirin EC 81 MG tablet Take 162 mg by mouth daily.    [name] atorvastatin (LIPITOR) 20 MG tablet Take 1 tablet by mouth daily.    [name] buPROPion (WELLBUTRIN XL) 300 MG 24 hr tablet TAKE 1 TABLET(300 MG) BY MOUTH EVERY MORNING 90 tablet 1  . DULoxetine (CYMBALTA) 30 MG capsule Take 3 capsules (90 mg total) by mouth daily. 270 capsule 1  . esomeprazole (NEXIUM) 40 MG capsule Take 40 mg by mouth daily before breakfast.    . etanercept (ENBREL SURECLICK) 50 MG/ML injection Inject 50 mg into the skin once a week. Tuesday    . furosemide (LASIX) 40 MG tablet Take 40 mg by mouth.    [name] glucosamine-chondroitin 500-400 MG tablet Take 2 tablets by mouth every morning.    . Insulin Pen Needle 32G X 4 MM MISC 1 each by Does not apply route once a week. 50 each 0  . JARDIANCE 25 MG TABS tablet Take 20 mg by mouth daily.     Javier Docker Oil 300 MG CAPS Take 300 mg by mouth daily.    [name] levobunolol (BETAGAN) 0.5 % ophthalmic solution Place 1 drop into both eyes every morning.     . linaclotide (LINZESS) 72 MCG capsule Take 1 capsule (72 mcg total) by mouth daily before breakfast. 30 capsule 1  . metFORMIN (GLUCOPHAGE) 1000 MG tablet Take 1,000 mg by mouth daily with breakfast.     . Multiple Vitamin (MULTIVITAMIN WITH MINERALS) TABS tablet Take 1 tablet by mouth daily.    [name] NIFEdipine (PROCARDIA-XL/ADALAT CC) 60 MG 24 hr tablet Take 1 tablet (60 mg total) by mouth daily. 30 tablet 1  . oxyCODONE-acetaminophen (PERCOCET/ROXICET) 5-325 MG tablet Take 1-2 tablets by mouth every 4 (four) hours as needed for severe pain.    [name] PRESCRIPTION MEDICATION Inhale 1 application into the lungs at bedtime. BiPAP setting 8/12    . QUEtiapine (SEROQUEL) 300 MG tablet Take 2 tablets (600 mg total) by mouth at bedtime. 180 tablet 1  . Semaglutide,0.25 or 0.5MG /DOS, (OZEMPIC, 0.25 OR 0.5 MG/DOSE,) 2 MG/1.5ML SOPN Inject 0.5 mg into the skin once a week. 1.5 mL 0  . Turmeric POWD Take 1,500  mcg by mouth daily at 12 noon.     . verapamil (CALAN-SR) 180 MG CR tablet Take 1 tablet (180 mg total) by mouth daily. 30 tablet 1  . Vitamin D, Ergocalciferol, (DRISDOL) 1.25 MG (50000 UNIT) CAPS capsule TAKE 1 CAPSULE BY MOUTH EVERY 7 DAYS 4 capsule 0   Current Facility-Administered Medications on File Prior to Visit  Medication Dose Route Frequency Provider Last Rate Last Admin  . 0.9 %  sodium chloride infusion  500 mL Intravenous Once Doran Stabler, MD         Allergies  Allergen Reactions  . Ace Inhibitors Anaphylaxis  . Arava [Leflunomide] Other (See Comments)    Nephritis and kidney clearance issues  . Travatan [Travoprost] Other (See Comments)    Red eyes, irritation  Social History   Occupational History  . Occupation: Retired  Tobacco Use  . Smoking status: Current Every Day Smoker    Packs/day: 0.50    Years: 25.00    Pack years: 12.50    Types: Cigarettes  . Smokeless tobacco: Never Used  Vaping Use  . Vaping Use: Never used  Substance and Sexual Activity  . Alcohol use: Yes    Alcohol/week: 0.0 standard drinks    Comment: " drink just on weekends " Last drink was in the spring of 2016  . Drug use: No  . Sexual activity: Not Currently    Family History  Problem Relation Age of Onset  . Cancer Maternal Aunt        breast ca  . Breast cancer Maternal Aunt   . Heart attack Maternal Grandmother 54  . Heart attack Maternal Grandfather 62  . Hypertension Sister   . Hypertension Sister   . Diabetes Mother   . Hypertension Mother   . Hyperlipidemia Mother   . Stroke Mother   . Depression Mother   . Eating disorder Mother   . Hypertension Father   . Seizures Maternal Uncle   . Alcohol abuse Neg Hx   . Anxiety disorder Neg Hx   . Bipolar disorder Neg Hx   . Drug abuse Neg Hx   . Schizophrenia Neg Hx   . Colon cancer Neg Hx   . Esophageal cancer Neg Hx   . Stomach cancer Neg Hx   . Rectal cancer Neg Hx      There is no immunization  history on file for this patient.   Objective: There were no vitals filed for this visit.  Jeanne [name] is a pleasant 59 y.o. African American female, obese, in NAD. AAO X 3.  Vascular Examination: Capillary fill time to digits <3 seconds b/l lower extremities. Faintly palpable DP pulse(s) b/l lower extremities. Faintly palpable PT pulse(s) b/l lower extremities. Pedal hair present. Lower extremity skin temperature gradient within normal limits. No pain with calf compression b/l. Varicosities present b/l.  Dermatological Examination: Pedal skin with normal turgor, texture and tone bilaterally. No open wounds bilaterally. No interdigital macerations bilaterally. Toenails 1-5 b/l elongated, discolored, dystrophic, thickened, crumbly with subungual debris and tenderness to dorsal palpation. Hyperkeratotic lesion(s) L hallux and R hallux.  No erythema, no edema, no drainage, no flocculence.  Musculoskeletal Examination: Normal muscle strength 5/5 to all lower extremity muscle groups bilaterally. No pain crepitus or joint limitation noted with ROM b/l. Hammertoes noted to the b/l lower extremities. Pes planus deformity noted b/l.   Footwear Assessment: Does the patient wear appropriate shoes? Yes. Does the patient need inserts/orthotics?  No.  Neurological Examination: Protective sensation intact 5/5 intact bilaterally with 10g monofilament b/l. Vibratory sensation intact b/l.    LOWER EXTREMITY DOPPLER STUDY 08/20/2019:  Indications: Decreased pulses.   High Risk Factors: Hypertension, hyperlipidemia, Diabetes, current smoker.    Performing Technologist: Alvia Grove RVT     Examination Guidelines: A complete evaluation includes at minimum, Doppler  waveform signals and systolic blood pressure reading at the level of  bilateral  brachial, anterior tibial, and posterior tibial arteries, when vessel  segments  are accessible. Bilateral testing is considered an integral part of a   complete  examination. Photoelectric Plethysmograph (PPG) waveforms and toe systolic  pressure readings are included as required and additional duplex testing  as  needed. Limited examinations for reoccurring indications may be performed  as  noted.  ABI Findings:  +---------+------------------+-----+---------+--------+  Right  Rt Pressure (mmHg)IndexWaveform Comment   +---------+------------------+-----+---------+--------+  Brachial 142                      +---------+------------------+-----+---------+--------+  PTA   169        1.19 triphasic      +---------+------------------+-----+---------+--------+  DP    173        1.22 triphasic      +---------+------------------+-----+---------+--------+  Great Toe111        0.78            +---------+------------------+-----+---------+--------+   +---------+------------------+-----+---------+-------+  Left   Lt Pressure (mmHg)IndexWaveform Comment  +---------+------------------+-----+---------+-------+  Brachial 139                      +---------+------------------+-----+---------+-------+  PTA   178        1.25 triphasic      +---------+------------------+-----+---------+-------+  DP    174        1.23 triphasic      +---------+------------------+-----+---------+-------+  Audrie Lia        0.84 Normal        +---------+------------------+-----+---------+-------+   +-------+-----------+-----------+------------+------------+  ABI/TBIToday's ABIToday's TBIPrevious ABIPrevious TBI  +-------+-----------+-----------+------------+------------+  Right 1.22    0.78                  +-------+-----------+-----------+------------+------------+  Left  1.25    0.84                   +-------+-----------+-----------+------------+------------+  Summary:  Right: Resting right ankle-brachial index is within normal range. No  evidence of significant right lower extremity arterial disease. The right  toe-brachial index is normal.   Left: Resting left ankle-brachial index is within normal range. No  evidence of significant left lower extremity arterial disease. The left  toe-brachial index is normal.   Hemoglobin A1C Latest Ref Rng & Units 01/02/2020 02/26/2019  HGBA1C 4.8 - 5.6 % 6.3(H) 6.3(H)  Some recent data might be hidden   Assessment: 1. Pain due to onychomycosis of toenails of both feet   2. Ingrown toenail without infection   3. Callus   4. Diabetic mononeuropathy associated with type 2 diabetes mellitus ([place])   5. Encounter for diabetic foot exam ([place])     Risk Categorization: Low Risk :  Patient has all of the following: Intact protective sensation No prior foot ulcer  No severe deformity Pedal pulses present  Plan: -Examined patient. -Discussed procedure for permanent partial nail avulsions of both borders of b/l great toes. Explained procedure and expected postoperative course. Plan is to perform procedure in 3 months. -Calluses pared b/l hallux utilizing sterile scalpel blade without incident. Total =2 -Toenails 1-5 b/l were debrided in length and girth with sterile nail nippers and dremel without iatrogenic bleeding. She could not tolerate debridement of bilateral great toes without local injection. Betadine prep applied to both great toes. Injected 3 cc's 1:1 mixture of 2% lidocaine plain and 0.5% marcaine plain. Offending nail borders debrided and curretaged. Borders cleansed with alcohol. Triple antibiotic ointment and band-aids applied. Patient given instructions for once daily epsom salt soaks for one week. -Patient to report any pedal injuries to medical professional immediately. -Patient to continue soft, supportive shoe gear  daily. -Patient/POA to call should there be question/concern in the interim.  Return in about 3 months (around 04/11/2020).  Marzetta Board, DPM

## 2020-01-14 ENCOUNTER — Encounter: Payer: Self-pay | Admitting: Podiatry

## 2020-01-16 ENCOUNTER — Encounter (INDEPENDENT_AMBULATORY_CARE_PROVIDER_SITE_OTHER): Payer: Self-pay | Admitting: Family Medicine

## 2020-01-16 ENCOUNTER — Other Ambulatory Visit: Payer: Self-pay

## 2020-01-16 ENCOUNTER — Ambulatory Visit (INDEPENDENT_AMBULATORY_CARE_PROVIDER_SITE_OTHER): Payer: Medicare Other | Admitting: Family Medicine

## 2020-01-16 VITALS — BP 124/76 | HR 74 | Temp 98.3°F | Ht 69.0 in | Wt 264.0 lb

## 2020-01-16 DIAGNOSIS — E559 Vitamin D deficiency, unspecified: Secondary | ICD-10-CM | POA: Diagnosis not present

## 2020-01-16 DIAGNOSIS — Z6839 Body mass index (BMI) 39.0-39.9, adult: Secondary | ICD-10-CM | POA: Diagnosis not present

## 2020-01-16 DIAGNOSIS — E1169 Type 2 diabetes mellitus with other specified complication: Secondary | ICD-10-CM | POA: Diagnosis not present

## 2020-01-16 MED ORDER — OZEMPIC (1 MG/DOSE) 4 MG/3ML ~~LOC~~ SOPN: 1.0000 mg | PEN_INJECTOR | SUBCUTANEOUS | 0 refills | Status: DC

## 2020-01-16 MED ORDER — VITAMIN D (ERGOCALCIFEROL) 1.25 MG (50000 UNIT) PO CAPS: ORAL_CAPSULE | ORAL | 0 refills | Status: DC

## 2020-01-17 NOTE — Progress Notes (Signed)
Chief Complaint:   OBESITY Artelia is here to discuss her progress with her obesity treatment plan along with follow-up of her obesity related diagnoses. Enas is on the Category 2 Plan and states she is following her eating plan approximately 25% of the time. Cendy states she is walking/walking in the water for 60 minutes 7 times per week.  Today's visit was #: 15 Starting weight: 281 lbs Starting date: 02/26/2019 Today's weight: 264 lbs Today's date: 01/16/2020 Total lbs lost to date: 17 lbs Total lbs lost since last in-office visit: 3 lbs Total weight loss percentage to date: -6.05%  Interim History: Joslyne has been eating about 1500 calories per day according to her MFP log.  She says, "I didn't realize how much I was snacking".  She has been eating at night as well.  She started Ozempic on Tuesday and has stopped metformin.  Blood glucose 105.  Two-hour postprandial 96.  Assessment/Plan:   1. Type 2 diabetes mellitus with other specified complication, without long-term current use of insulin (HCC) Diabetes Mellitus: stable. Medication: Ozempic. Issues reviewed with her: blood sugar goals, complications of diabetes mellitus, hypoglycemia prevention and treatment, exercise, nutrition, and carbohydrate counting.  Stop Jardiance.  Lab Results  Component Value Date   HGBA1C 6.3 (H) 01/02/2020   HGBA1C 6.3 (H) 02/26/2019   Lab Results  Component Value Date   LDLCALC 90 01/02/2020   CREATININE 1.18 (H) 01/02/2020   - Semaglutide, 1 MG/DOSE, (OZEMPIC, 1 MG/DOSE,) 4 MG/3ML SOPN; Inject 1 mg into the skin once a week.  Dispense: 9 mL; Refill: 0  2. Vitamin D deficiency Current vitamin D is 29.7, tested on 01/02/2020. Not at goal. Optimal goal > 50 ng/dL. There is also evidence to support a goal of >70 ng/dL in patients with cancer and heart disease. Plan: Continue Vitamin D @50 ,000 IU every week with follow-up for routine testing of Vitamin D at least 2-3 times per year  to avoid over-replacement.  - Vitamin D, Ergocalciferol, (DRISDOL) 1.25 MG (50000 UNIT) CAPS capsule; TAKE 1 CAPSULE BY MOUTH EVERY 7 DAYS  Dispense: 4 capsule; Refill: 0  3. Class 2 severe obesity with serious comorbidity and body mass index (BMI) of 39.0 to 39.9 in adult, unspecified obesity type (HCC)  Tamma is currently in the action stage of change. As such, her goal is to continue with weight loss efforts. She has agreed to the Category 2 Plan.   Exercise goals: For substantial health benefits, adults should do at least 150 minutes (2 hours and 30 minutes) a week of moderate-intensity, or 75 minutes (1 hour and 15 minutes) a week of vigorous-intensity aerobic physical activity, or an equivalent combination of moderate- and vigorous-intensity aerobic activity. Aerobic activity should be performed in episodes of at least 10 minutes, and preferably, it should be spread throughout the week.  Behavioral modification strategies: increasing lean protein intake and keeping a strict food journal.  Fronnie has agreed to follow-up with our clinic in 2-3 weeks. She was informed of the importance of frequent follow-up visits to maximize her success with intensive lifestyle modifications for her multiple health conditions.   Objective:   Blood pressure 124/76, pulse 74, temperature 98.3 F (36.8 C), temperature source Oral, height 5\' 9"  (1.753 m), weight 264 lb (119.7 kg), SpO2 98 %. Body mass index is 38.99 kg/m.  General: Cooperative, alert, well developed, in no acute distress. HEENT: Conjunctivae and lids unremarkable. Cardiovascular: Regular rhythm.  Lungs: Normal work of breathing. Neurologic: No  focal deficits.   Lab Results  Component Value Date   CREATININE 1.18 (H) 01/02/2020   BUN 17 01/02/2020   NA 141 01/02/2020   K 4.5 01/02/2020   CL 102 01/02/2020   CO2 27 01/02/2020   Lab Results  Component Value Date   ALT 22 01/02/2020   AST 13 01/02/2020   ALKPHOS 131 (H)  01/02/2020   BILITOT 0.4 01/02/2020   Lab Results  Component Value Date   HGBA1C 6.3 (H) 01/02/2020   HGBA1C 6.3 (H) 02/26/2019   Lab Results  Component Value Date   INSULIN 21.0 02/26/2019   Lab Results  Component Value Date   TSH 0.783 01/02/2020   Lab Results  Component Value Date   CHOL 178 01/02/2020   HDL 74 01/02/2020   LDLCALC 90 01/02/2020   TRIG 78 01/02/2020   CHOLHDL 2.4 01/02/2020   Lab Results  Component Value Date   WBC 4.8 01/02/2020   HGB 14.9 01/02/2020   HCT 42.9 01/02/2020   MCV 95 01/02/2020   PLT 172 01/02/2020   Obesity Behavioral Intervention:   Approximately 15 minutes were spent on the discussion below.  ASK: We discussed the diagnosis of obesity with Elsye today and Ilaria agreed to give [name] permission to discuss obesity behavioral modification therapy today.  ASSESS: Yohanna has the diagnosis of obesity and her BMI today is 39.0. Tresha is in the action stage of change.   ADVISE: Somer was educated on the multiple health risks of obesity as well as the benefit of weight loss to improve her health. She was advised of the need for long term treatment and the importance of lifestyle modifications to improve her current health and to decrease her risk of future health problems.  AGREE: Multiple dietary modification options and treatment options were discussed and Arisha agreed to follow the recommendations documented in the above note.  ARRANGE: Vung was educated on the importance of frequent visits to treat obesity as outlined per CMS and USPSTF guidelines and agreed to schedule her next follow up appointment today.  Attestation Statements:   Reviewed by clinician on day of visit: allergies, medications, problem list, medical history, surgical history, family history, social history, and previous encounter notes.  I, Water quality scientist, CMA, am acting as transcriptionist for [name], DO  I have reviewed the above  documentation for accuracy and completeness, and I agree with the above. [name], DO

## 2020-01-28 ENCOUNTER — Ambulatory Visit (INDEPENDENT_AMBULATORY_CARE_PROVIDER_SITE_OTHER): Payer: Medicare Other | Admitting: Psychology

## 2020-01-28 ENCOUNTER — Encounter: Payer: Self-pay | Admitting: Podiatry

## 2020-01-28 DIAGNOSIS — F251 Schizoaffective disorder, depressive type: Secondary | ICD-10-CM

## 2020-01-29 ENCOUNTER — Ambulatory Visit (INDEPENDENT_AMBULATORY_CARE_PROVIDER_SITE_OTHER): Payer: Medicare Other | Admitting: Family Medicine

## 2020-01-29 ENCOUNTER — Encounter (INDEPENDENT_AMBULATORY_CARE_PROVIDER_SITE_OTHER): Payer: Self-pay | Admitting: Family Medicine

## 2020-01-29 ENCOUNTER — Other Ambulatory Visit: Payer: Self-pay

## 2020-01-29 VITALS — BP 111/69 | HR 67 | Temp 98.2°F | Ht 69.0 in | Wt 260.0 lb

## 2020-01-29 DIAGNOSIS — E1169 Type 2 diabetes mellitus with other specified complication: Secondary | ICD-10-CM | POA: Diagnosis not present

## 2020-01-29 DIAGNOSIS — N1831 Chronic kidney disease, stage 3a: Secondary | ICD-10-CM

## 2020-01-29 DIAGNOSIS — E559 Vitamin D deficiency, unspecified: Secondary | ICD-10-CM

## 2020-01-29 DIAGNOSIS — Z7409 Other reduced mobility: Secondary | ICD-10-CM

## 2020-01-29 DIAGNOSIS — E669 Obesity, unspecified: Secondary | ICD-10-CM

## 2020-01-29 DIAGNOSIS — Z6838 Body mass index (BMI) 38.0-38.9, adult: Secondary | ICD-10-CM | POA: Diagnosis not present

## 2020-01-29 DIAGNOSIS — E1122 Type 2 diabetes mellitus with diabetic chronic kidney disease: Secondary | ICD-10-CM | POA: Insufficient documentation

## 2020-01-29 NOTE — Progress Notes (Signed)
Chief Complaint:   OBESITY Tashae is here to discuss her progress with her obesity treatment plan along with follow-up of her obesity related diagnoses.   Today's visit was #: 16 Starting weight: 281 lbs Starting date: 02/26/2019 Today's weight: 260 lbs Today's date: 01/29/2020 Total lbs lost to date: 21 lbs Body mass index is 38.4 kg/m.  Total weight loss percentage to date: -7.47%  Interim History: Gennavieve says, "I decided to stop giving up on myself.  I'm strongest in the middle".  She means that she has always done well "in the middle" of a race and wants to keep going. She will be starting Ozempic next week.  Fasting blood sugar is 105.  Nutrition Plan: Category 2. Anti-obesity medications: Wellbutrin, Ozempic. Reported side effects: None. Hunger is well controlled controlled. Cravings are well controlled controlled.  Activity: Walking and walking in the pool for 40 minutes 5-6 times per week Sleep: Sleep is restful.  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  Assessment/Plan:   1. Limited mobility She has done PT at [facility] previously and felt that it was very helpful. I will refer back. We will continue to monitor symptoms as they relate to her weight loss journey.  - Ambulatory referral to Physical Therapy  2. Type 2 diabetes mellitus with other specified complication, without long-term current use of insulin (HCC) Diabetes Mellitus: stable. Medication: Ozempic. Issues reviewed with her: blood sugar goals, complications of diabetes mellitus, hypoglycemia prevention and treatment, exercise, nutrition. She will continue to focus on protein-rich, low simple carbohydrate foods. We reviewed the importance of hydration, regular exercise for stress reduction, and restorative sleep.   Lab Results  Component Value Date   HGBA1C 6.3 (H) 01/02/2020   HGBA1C 6.3 (H) 02/26/2019   Lab Results  Component Value Date   LDLCALC  90 01/02/2020   CREATININE 1.18 (H) 01/02/2020   3. Stage 3a chronic kidney disease (HCC) Course: unchanged. Goal: Stable to improved. Plan: Continue to monitor BMP and avoid nephrotoxic agents. Diet: Avoid buying foods that are: processed, frozen, or prepackaged to avoid excess salt.   Lab Results  Component Value Date   CREATININE 1.18 (H) 01/02/2020   CREATININE 1.27 (H) 02/26/2019   CREATININE 1.18 (H) 02/01/2018   Lab Results  Component Value Date   CREATININE 1.18 (H) 01/02/2020   BUN 17 01/02/2020   NA 141 01/02/2020   K 4.5 01/02/2020   CL 102 01/02/2020   CO2 27 01/02/2020   4. Vitamin D deficiency Current vitamin D is 29.7, tested on 01/02/2020. Not at goal. Optimal goal > 50 ng/dL.  Plan: Continue Vitamin D @50 ,000 IU every week with follow-up for routine testing of Vitamin D at least 2-3 times per year to avoid over-replacement.  5. Class 2 obesity without serious comorbidity with body mass index (BMI) of 38.0 to 38.9 in adult, unspecified obesity type  Feliza is currently in the action stage of change. As such, her goal is to continue with weight loss efforts.   Nutrition goals: She has agreed to the Category 2 Plan.   Exercise goals: As is.  Add PT - strength.  Behavioral modification strategies: increasing water intake and emotional eating strategies.  Delanda has agreed to follow-up with our clinic in 2-3 weeks. She was informed of the importance of frequent follow-up visits to maximize her success with intensive lifestyle modifications for her multiple health conditions.   Objective:   Blood pressure 111/69,  pulse 67, temperature 98.2 F (36.8 C), temperature source Oral, height 5\' 9"  (1.753 m), weight 260 lb (117.9 kg), SpO2 100 %. Body mass index is 38.4 kg/m.  General: Cooperative, alert, well developed, in no acute distress. HEENT: Conjunctivae and lids unremarkable. Cardiovascular: Regular rhythm.  Lungs: Normal work of breathing. Neurologic:  No focal deficits.   Lab Results  Component Value Date   CREATININE 1.18 (H) 01/02/2020   BUN 17 01/02/2020   NA 141 01/02/2020   K 4.5 01/02/2020   CL 102 01/02/2020   CO2 27 01/02/2020   Lab Results  Component Value Date   ALT 22 01/02/2020   AST 13 01/02/2020   ALKPHOS 131 (H) 01/02/2020   BILITOT 0.4 01/02/2020   Lab Results  Component Value Date   HGBA1C 6.3 (H) 01/02/2020   HGBA1C 6.3 (H) 02/26/2019   Lab Results  Component Value Date   INSULIN 21.0 02/26/2019   Lab Results  Component Value Date   TSH 0.783 01/02/2020   Lab Results  Component Value Date   CHOL 178 01/02/2020   HDL 74 01/02/2020   LDLCALC 90 01/02/2020   TRIG 78 01/02/2020   CHOLHDL 2.4 01/02/2020   Lab Results  Component Value Date   WBC 4.8 01/02/2020   HGB 14.9 01/02/2020   HCT 42.9 01/02/2020   MCV 95 01/02/2020   PLT 172 01/02/2020   Obesity Behavioral Intervention:   Approximately 15 minutes were spent on the discussion below.  ASK: We discussed the diagnosis of obesity with Hasana today and Rilea agreed to give [name] permission to discuss obesity behavioral modification therapy today.  ASSESS: Kent has the diagnosis of obesity and her BMI today is 38.5. Datha is in the action stage of change.   ADVISE: Shanikka was educated on the multiple health risks of obesity as well as the benefit of weight loss to improve her health. She was advised of the need for long term treatment and the importance of lifestyle modifications to improve her current health and to decrease her risk of future health problems.  AGREE: Multiple dietary modification options and treatment options were discussed and Tavionna agreed to follow the recommendations documented in the above note.  ARRANGE: Kirsta was educated on the importance of frequent visits to treat obesity as outlined per CMS and USPSTF guidelines and agreed to schedule her next follow up appointment today.  Attestation  Statements:   Reviewed by clinician on day of visit: allergies, medications, problem list, medical history, surgical history, family history, social history, and previous encounter notes.  I, Water quality scientist, CMA, am acting as transcriptionist for [name], DO  I have reviewed the above documentation for accuracy and completeness, and I agree with the above. [name], DO

## 2020-02-18 ENCOUNTER — Ambulatory Visit (INDEPENDENT_AMBULATORY_CARE_PROVIDER_SITE_OTHER): Payer: Medicare Other | Admitting: Family Medicine

## 2020-02-18 ENCOUNTER — Other Ambulatory Visit: Payer: Self-pay

## 2020-02-18 ENCOUNTER — Encounter (INDEPENDENT_AMBULATORY_CARE_PROVIDER_SITE_OTHER): Payer: Self-pay | Admitting: Family Medicine

## 2020-02-18 VITALS — BP 134/78 | HR 85 | Temp 98.8°F | Ht 69.0 in | Wt 255.0 lb

## 2020-02-18 DIAGNOSIS — E559 Vitamin D deficiency, unspecified: Secondary | ICD-10-CM

## 2020-02-18 DIAGNOSIS — E66812 Obesity, class 2: Secondary | ICD-10-CM

## 2020-02-18 DIAGNOSIS — E65 Localized adiposity: Secondary | ICD-10-CM | POA: Diagnosis not present

## 2020-02-18 DIAGNOSIS — E1169 Type 2 diabetes mellitus with other specified complication: Secondary | ICD-10-CM

## 2020-02-18 DIAGNOSIS — Z6837 Body mass index (BMI) 37.0-37.9, adult: Secondary | ICD-10-CM

## 2020-02-18 MED ORDER — VITAMIN D (ERGOCALCIFEROL) 1.25 MG (50000 UNIT) PO CAPS: ORAL_CAPSULE | ORAL | 0 refills | Status: DC

## 2020-02-19 NOTE — Progress Notes (Signed)
Chief Complaint:   OBESITY Jeanne [name] is here to discuss her progress with her obesity treatment plan along with follow-up of her obesity related diagnoses.   Today's visit was #: 27 Starting weight: 281 lbs Starting date: 02/26/2019 Today's weight: 255 lbs Today's date: 02/18/2020 Total lbs lost to date: 26 lbs Body mass index is 37.66 kg/m.  Total weight loss percentage to date: -9.25%  Interim History: Jeanne [name] says she is stepping down from [employer] on December 31.  She is going back to roups (french speaking, writing).  She is just back from a retreat and a trip with her sisters.  She says she is feeling good.  Nutrition Plan: the Category 2 Plan for 50% of the time.  Anti-obesity medications: Ozempic 1 mg subcutaneously weekly. Reported side effects: None. Hunger is moderately controlled controlled. Cravings are moderately controlled controlled.  Activity: Walking/pool for 60 minutes 2 times per week.  Assessment/Plan:   1. Vitamin D deficiency Current vitamin D is 29.7, tested on 01/02/2020. Not at goal. Optimal goal > 50 ng/dL.   Plan:  [x]   Continue Vitamin D @50 ,000 IU every week. []   Continue home supplement daily. [x]   Follow-up for routine testing of Vitamin D at least 2-3 times per year to avoid over-replacement. []   Monitored by PCP.  - Refill Vitamin D, Ergocalciferol, (DRISDOL) 1.25 MG (50000 UNIT) CAPS capsule; TAKE 1 CAPSULE BY MOUTH EVERY 7 DAYS  Dispense: 4 capsule; Refill: 0  2. Type 2 diabetes mellitus with other specified complication, without long-term current use of insulin (HCC) Diabetes Mellitus: stable. Medication: Ozempic 1 mg subcutaneously weekly. Issues reviewed with her: blood sugar goals, complications of diabetes mellitus, hypoglycemia prevention and treatment, exercise, nutrition, and carbohydrate counting.   Lab Results  Component Value Date   HGBA1C 6.3 (H) 01/02/2020   HGBA1C 6.3 (H) 02/26/2019   Lab Results    Component Value Date   LDLCALC 90 01/02/2020   CREATININE 1.18 (H) 01/02/2020   3. Visceral obesity Current visceral fat rating: 14. Visceral adipose tissue is a hormonally active component of total body fat. This body composition phenotype is associated with medical disorders such as metabolic syndrome, cardiovascular disease and several malignancies including prostate, breast, and colorectal cancers. Starting goal: Lose 7-10% of starting weight. Visceral fat rating should be < 13.  4. Class 2 severe obesity with serious comorbidity and body mass index (BMI) of 37.0 to 37.9 in adult, unspecified obesity type (HCC)  Jeanne [name] is currently in the action stage of change. As such, her goal is to continue with weight loss efforts.   Nutrition goals: She has agreed to the Category 2 Plan.   Exercise goals: For substantial health benefits, adults should do at least 150 minutes (2 hours and 30 minutes) a week of moderate-intensity, or 75 minutes (1 hour and 15 minutes) a week of vigorous-intensity aerobic physical activity, or an equivalent combination of moderate- and vigorous-intensity aerobic activity. Aerobic activity should be performed in episodes of at least 10 minutes, and preferably, it should be spread throughout the week.  Behavioral modification strategies: increasing lean protein intake, decreasing simple carbohydrates, increasing vegetables and increasing water intake.  Jeanne [name] has agreed to follow-up with our clinic in 3 weeks. She was informed of the importance of frequent follow-up visits to maximize her success with intensive lifestyle modifications for her multiple health conditions.   Objective:   Blood pressure 134/78, pulse 85, temperature 98.8 F (37.1 C), temperature source Oral, height 5'  9" (1.753 m), weight 255 lb (115.7 kg), SpO2 97 %. Body mass index is 37.66 kg/m.  General: Cooperative, alert, well developed, in no acute distress. HEENT: Conjunctivae and lids  unremarkable. Cardiovascular: Regular rhythm.  Lungs: Normal work of breathing. Neurologic: No focal deficits.   Lab Results  Component Value Date   CREATININE 1.18 (H) 01/02/2020   BUN 17 01/02/2020   NA 141 01/02/2020   K 4.5 01/02/2020   CL 102 01/02/2020   CO2 27 01/02/2020   Lab Results  Component Value Date   ALT 22 01/02/2020   AST 13 01/02/2020   ALKPHOS 131 (H) 01/02/2020   BILITOT 0.4 01/02/2020   Lab Results  Component Value Date   HGBA1C 6.3 (H) 01/02/2020   HGBA1C 6.3 (H) 02/26/2019   Lab Results  Component Value Date   INSULIN 21.0 02/26/2019   Lab Results  Component Value Date   TSH 0.783 01/02/2020   Lab Results  Component Value Date   CHOL 178 01/02/2020   HDL 74 01/02/2020   LDLCALC 90 01/02/2020   TRIG 78 01/02/2020   CHOLHDL 2.4 01/02/2020   Lab Results  Component Value Date   WBC 4.8 01/02/2020   HGB 14.9 01/02/2020   HCT 42.9 01/02/2020   MCV 95 01/02/2020   PLT 172 01/02/2020   Attestation Statements:   Reviewed by clinician on day of visit: allergies, medications, problem list, medical history, surgical history, family history, social history, and previous encounter notes.  I, Water quality scientist, CMA, am acting as transcriptionist for [name], DO  I have reviewed the above documentation for accuracy and completeness, and I agree with the above. [name], DO

## 2020-03-04 ENCOUNTER — Ambulatory Visit (INDEPENDENT_AMBULATORY_CARE_PROVIDER_SITE_OTHER): Payer: Medicare Other | Admitting: Psychology

## 2020-03-04 ENCOUNTER — Ambulatory Visit (INDEPENDENT_AMBULATORY_CARE_PROVIDER_SITE_OTHER): Payer: Medicare Other | Admitting: Family Medicine

## 2020-03-04 ENCOUNTER — Encounter (INDEPENDENT_AMBULATORY_CARE_PROVIDER_SITE_OTHER): Payer: Self-pay | Admitting: Family Medicine

## 2020-03-04 ENCOUNTER — Other Ambulatory Visit: Payer: Self-pay

## 2020-03-04 VITALS — BP 109/69 | HR 75 | Temp 98.7°F | Ht 69.0 in | Wt 256.0 lb

## 2020-03-04 DIAGNOSIS — Z6837 Body mass index (BMI) 37.0-37.9, adult: Secondary | ICD-10-CM | POA: Diagnosis not present

## 2020-03-04 DIAGNOSIS — F251 Schizoaffective disorder, depressive type: Secondary | ICD-10-CM

## 2020-03-04 DIAGNOSIS — E559 Vitamin D deficiency, unspecified: Secondary | ICD-10-CM

## 2020-03-04 DIAGNOSIS — E1159 Type 2 diabetes mellitus with other circulatory complications: Secondary | ICD-10-CM | POA: Diagnosis not present

## 2020-03-04 DIAGNOSIS — E1169 Type 2 diabetes mellitus with other specified complication: Secondary | ICD-10-CM

## 2020-03-04 DIAGNOSIS — Z794 Long term (current) use of insulin: Secondary | ICD-10-CM

## 2020-03-04 DIAGNOSIS — E66812 Obesity, class 2: Secondary | ICD-10-CM

## 2020-03-04 DIAGNOSIS — I152 Hypertension secondary to endocrine disorders: Secondary | ICD-10-CM

## 2020-03-04 MED ORDER — OZEMPIC (1 MG/DOSE) 4 MG/3ML ~~LOC~~ SOPN: 1.0000 mg | PEN_INJECTOR | SUBCUTANEOUS | 0 refills | Status: DC

## 2020-03-04 MED ORDER — VITAMIN D (ERGOCALCIFEROL) 1.25 MG (50000 UNIT) PO CAPS: ORAL_CAPSULE | ORAL | 0 refills | Status: DC

## 2020-03-04 NOTE — Progress Notes (Signed)
Chief Complaint:   OBESITY Jeanne [name] is here to discuss her progress with her obesity treatment plan along with follow-up of her obesity related diagnoses.   Today's visit was #: 18 Starting weight: 281 lbs Starting date: 02/26/2019 Today's weight: 256 Today's date: 03/04/2020 Total lbs lost to date: 25 lbs Body mass index is 37.8 kg/m.  Total weight loss percentage to date: -8.90%  Interim History: Jeanne [name]'s bioimpedence today show's that her weight is stable, fat mass has decreased 2 pounds, muscle mass is up 3 pounds, and water weight has decreased 1 pound since her last visit.    She says that she had a URI/bronchitis since her last visit.  She was on Cefdinir.  No prednisone.   Nutrition Plan: the Category 2 Plan for 60% of the time.  Anti-obesity medications: Ozempic. Reported side effects: None. Hunger is well controlled controlled. Cravings are well controlled controlled.  Activity: Walking and using the pool for 90 minutes 6 times per week. Sleep: Sleep is restful.  Stress: Hosting Thanksgiving. Worried about a few family members that are unvaccinated.  Assessment/Plan:   1. Vitamin D deficiency Not at goal. Current vitamin D is 29.7, tested on 01/02/2020. Optimal goal > 50 ng/dL.   Plan:  [x]   Continue Vitamin D @50 ,000 IU every week. []   Continue home supplement daily. [x]   Follow-up for routine testing of Vitamin D at least 2-3 times per year to avoid over-replacement.  - Refill Vitamin D, Ergocalciferol, (DRISDOL) 1.25 MG (50000 UNIT) CAPS capsule; TAKE 1 CAPSULE BY MOUTH EVERY 7 DAYS  Dispense: 4 capsule; Refill: 0  2. Type 2 diabetes mellitus with other specified complication, with long-term current use of insulin (HCC) Diabetes Mellitus: stable. Medication: Ozempic. Issues reviewed with her: blood sugar goals, complications of diabetes mellitus, hypoglycemia prevention and treatment, exercise, and nutrition.  Lab Results  Component Value Date    HGBA1C 6.3 (H) 01/02/2020   HGBA1C 6.3 (H) 02/26/2019   Lab Results  Component Value Date   LDLCALC 90 01/02/2020   CREATININE 1.18 (H) 01/02/2020   - Refill Semaglutide, 1 MG/DOSE, (OZEMPIC, 1 MG/DOSE,) 4 MG/3ML SOPN; Inject 1 mg into the skin once a week.  Dispense: 9 mL; Refill: 0  3. Hypertension associated with diabetes ([place]) At goal. Medications: Norvasc, nifedipine, verapamil.   Plan:  Will monitor for hypotension.  Avoid buying foods that are: processed, frozen, or prepackaged to avoid excess salt. We will continue to monitor symptoms as they relate to her weight loss journey.  BP Readings from Last 3 Encounters:  03/04/20 109/69  02/18/20 134/78  01/29/20 111/69   Lab Results  Component Value Date   CREATININE 1.18 (H) 01/02/2020   4. Class 2 severe obesity with serious comorbidity and body mass index (BMI) of 37.0 to 37.9 in adult, unspecified obesity type Jeanne [name])  Course: Jeanne [name] is currently in the action stage of change. As such, her goal is to continue with weight loss efforts.   Nutrition goals: She has agreed to the Category 2 Plan.   Exercise goals: For substantial health benefits, adults should do at least 150 minutes (2 hours and 30 minutes) a week of moderate-intensity, or 75 minutes (1 hour and 15 minutes) a week of vigorous-intensity aerobic physical activity, or an equivalent combination of moderate- and vigorous-intensity aerobic activity. Aerobic activity should be performed in episodes of at least 10 minutes, and preferably, it should be spread throughout the week. Adults should also include muscle-strengthening activities that involve all  major muscle groups on 2 or more days a week.  Behavioral modification strategies: increasing lean protein intake, decreasing simple carbohydrates, increasing vegetables and increasing water intake.  Jeanne [name] has agreed to follow-up with our clinic in 2 weeks. She was informed of the importance of frequent follow-up  visits to maximize her success with intensive lifestyle modifications for her multiple health conditions.   Objective:   Blood pressure 109/69, pulse 75, temperature 98.7 F (37.1 C), height 5\' 9"  (1.753 m), weight 256 lb (116.1 kg), SpO2 100 %. Body mass index is 37.8 kg/m.  General: Cooperative, alert, well developed, in no acute distress. HEENT: Conjunctivae and lids unremarkable. Cardiovascular: Regular rhythm.  Lungs: Normal work of breathing. Neurologic: No focal deficits.   Lab Results  Component Value Date   CREATININE 1.18 (H) 01/02/2020   BUN 17 01/02/2020   NA 141 01/02/2020   K 4.5 01/02/2020   CL 102 01/02/2020   CO2 27 01/02/2020   Lab Results  Component Value Date   ALT 22 01/02/2020   AST 13 01/02/2020   ALKPHOS 131 (H) 01/02/2020   BILITOT 0.4 01/02/2020   Lab Results  Component Value Date   HGBA1C 6.3 (H) 01/02/2020   HGBA1C 6.3 (H) 02/26/2019   Lab Results  Component Value Date   INSULIN 21.0 02/26/2019   Lab Results  Component Value Date   TSH 0.783 01/02/2020   Lab Results  Component Value Date   CHOL 178 01/02/2020   HDL 74 01/02/2020   LDLCALC 90 01/02/2020   TRIG 78 01/02/2020   CHOLHDL 2.4 01/02/2020   Lab Results  Component Value Date   WBC 4.8 01/02/2020   HGB 14.9 01/02/2020   HCT 42.9 01/02/2020   MCV 95 01/02/2020   PLT 172 01/02/2020   Obesity Behavioral Intervention:   Approximately 15 minutes were spent on the discussion below.  ASK: We discussed the diagnosis of obesity with Jeanne [name] today and Jeanne [name] agreed to give [name] permission to discuss obesity behavioral modification therapy today.  ASSESS: Jeanne [name] has the diagnosis of obesity and her Body mass index is 37.8 kg/m.  Jeanne [name] is in the action stage of change.   ADVISE: Jeanne [name] was educated on the multiple health risks of obesity as well as the benefit of weight loss to improve her health. She was advised of the need for long term treatment and the  importance of lifestyle modifications to improve her current health and to decrease her risk of future health problems.  AGREE: Multiple dietary modification options and treatment options were discussed and Jeanne [name] agreed to follow the recommendations documented in the above note.  ARRANGE: Jeanne [name] was educated on the importance of frequent visits to treat obesity as outlined per CMS and USPSTF guidelines and agreed to schedule her next follow up appointment today.  Attestation Statements:   Reviewed by clinician on day of visit: allergies, medications, problem list, medical history, surgical history, family history, social history, and previous encounter notes.  I, Water quality scientist, CMA, am acting as transcriptionist for [name], DO  I have reviewed the above documentation for accuracy and completeness, and I agree with the above. [name], DO

## 2020-03-18 ENCOUNTER — Encounter (INDEPENDENT_AMBULATORY_CARE_PROVIDER_SITE_OTHER): Payer: Self-pay | Admitting: Family Medicine

## 2020-03-18 ENCOUNTER — Other Ambulatory Visit: Payer: Self-pay

## 2020-03-18 ENCOUNTER — Ambulatory Visit (INDEPENDENT_AMBULATORY_CARE_PROVIDER_SITE_OTHER): Payer: Medicare Other | Admitting: Family Medicine

## 2020-03-18 VITALS — BP 109/60 | HR 74 | Temp 98.0°F | Ht 69.0 in | Wt 262.0 lb

## 2020-03-18 DIAGNOSIS — Z6838 Body mass index (BMI) 38.0-38.9, adult: Secondary | ICD-10-CM

## 2020-03-18 DIAGNOSIS — E8881 Metabolic syndrome: Secondary | ICD-10-CM | POA: Diagnosis not present

## 2020-03-18 DIAGNOSIS — E1169 Type 2 diabetes mellitus with other specified complication: Secondary | ICD-10-CM

## 2020-03-18 DIAGNOSIS — E669 Obesity, unspecified: Secondary | ICD-10-CM | POA: Diagnosis not present

## 2020-03-18 DIAGNOSIS — E559 Vitamin D deficiency, unspecified: Secondary | ICD-10-CM

## 2020-03-18 MED ORDER — VITAMIN D (ERGOCALCIFEROL) 1.25 MG (50000 UNIT) PO CAPS: ORAL_CAPSULE | ORAL | 0 refills | Status: DC

## 2020-03-18 NOTE — Progress Notes (Signed)
Chief Complaint:   OBESITY Ricardo is here to discuss her progress with her obesity treatment plan along with follow-up of her obesity related diagnoses.   Today's visit was #: 74 Starting weight: 281 lbs Starting date: 02/26/2019 Today's weight: 262 lbs Today's date: 03/18/2020 Total lbs lost to date: 19 lbs Body mass index is 38.69 kg/m.  Total weight loss percentage to date: -6.76%  Interim History: Kendre was sick and in bed for 2 weeks.  She says she is feeling much better and is ready to get back to the gym tomorrow.  Her A1c was 5.8 at her PCP's office 3 weeks ago. Nutrition Plan: the Category 2 Plan for 25% of the time.  Anti-obesity medications: Ozempic.  Activity: None at this time.  Assessment/Plan:   1. Vitamin D deficiency Not at goal. Current vitamin D is 29.7, tested on 01/02/2020. Optimal goal > 50 ng/dL.   Plan:  [x]   Continue Vitamin D @50 ,000 IU every week. []   Continue home supplement daily. [x]   Follow-up for routine testing of Vitamin D at least 2-3 times per year to avoid over-replacement.  - Refill Vitamin D, Ergocalciferol, (DRISDOL) 1.25 MG (50000 UNIT) CAPS capsule; TAKE 1 CAPSULE BY MOUTH EVERY 7 DAYS  Dispense: 4 capsule; Refill: 0  2. Metabolic syndrome Improving. Will abstract labs from PCP office and recalculate ASCVD. Starting goal: Lose 7-10% of starting weight. She will continue to focus on protein-rich, low simple carbohydrate foods. We reviewed the importance of hydration, regular exercise for stress reduction, and restorative sleep.  We will continue to check lab work every 3 months, with 10% weight loss, or should any other concerns arise.  Visceral fat rating 15.  The 10-year ASCVD risk score Mikey Bussing DC Brooke Bonito., et al., 2013) is: 13.2%   Values used to calculate the score:     Age: 2 years     Sex: Female     Is Non-Hispanic African American: Yes     Diabetic: Yes     Tobacco smoker: Yes     Systolic Blood Pressure: 144 mmHg      Is BP treated: Yes     HDL Cholesterol: 74 mg/dL     Total Cholesterol: 178 mg/dL  3. Type 2 diabetes mellitus with other specified complication, without long-term current use of insulin (HCC) Diabetes Mellitus: improved. Medication: Ozempic 1 mg subcutaneously weekly. Issues reviewed with her: blood sugar goals, complications of diabetes mellitus, hypoglycemia prevention and treatment, exercise, and nutrition.  Lab Results  Component Value Date   HGBA1C 6.3 (H) 01/02/2020   HGBA1C 6.3 (H) 02/26/2019   Lab Results  Component Value Date   LDLCALC 90 01/02/2020   CREATININE 1.18 (H) 01/02/2020   4. Class 2 obesity without serious comorbidity with body mass index (BMI) of 38.0 to 38.9 in adult, unspecified obesity type  Course: Rosellen is currently in the action stage of change. As such, her goal is to continue with weight loss efforts.   Nutrition goals: She has agreed to the Category 2 Plan.   Exercise goals: For substantial health benefits, adults should do at least 150 minutes (2 hours and 30 minutes) a week of moderate-intensity, or 75 minutes (1 hour and 15 minutes) a week of vigorous-intensity aerobic physical activity, or an equivalent combination of moderate- and vigorous-intensity aerobic activity. Aerobic activity should be performed in episodes of at least 10 minutes, and preferably, it should be spread throughout the week.  Behavioral modification strategies: increasing lean protein  intake, decreasing simple carbohydrates, increasing vegetables, increasing water intake and decreasing liquid calories.  Aya has agreed to follow-up with our clinic in 2 weeks. She was informed of the importance of frequent follow-up visits to maximize her success with intensive lifestyle modifications for her multiple health conditions.   Objective:   Blood pressure 109/60, pulse 74, temperature 98 F (36.7 C), temperature source Oral, height 5\' 9"  (1.753 m), weight 262 lb (118.8 kg),  SpO2 98 %. Body mass index is 38.69 kg/m.  General: Cooperative, alert, well developed, in no acute distress. HEENT: Conjunctivae and lids unremarkable. Cardiovascular: Regular rhythm.  Lungs: Normal work of breathing. Neurologic: No focal deficits.   Lab Results  Component Value Date   CREATININE 1.18 (H) 01/02/2020   BUN 17 01/02/2020   NA 141 01/02/2020   K 4.5 01/02/2020   CL 102 01/02/2020   CO2 27 01/02/2020   Lab Results  Component Value Date   ALT 22 01/02/2020   AST 13 01/02/2020   ALKPHOS 131 (H) 01/02/2020   BILITOT 0.4 01/02/2020   Lab Results  Component Value Date   HGBA1C 6.3 (H) 01/02/2020   HGBA1C 6.3 (H) 02/26/2019   Lab Results  Component Value Date   INSULIN 21.0 02/26/2019   Lab Results  Component Value Date   TSH 0.783 01/02/2020   Lab Results  Component Value Date   CHOL 178 01/02/2020   HDL 74 01/02/2020   LDLCALC 90 01/02/2020   TRIG 78 01/02/2020   CHOLHDL 2.4 01/02/2020   Lab Results  Component Value Date   WBC 4.8 01/02/2020   HGB 14.9 01/02/2020   HCT 42.9 01/02/2020   MCV 95 01/02/2020   PLT 172 01/02/2020   Obesity Behavioral Intervention:   Approximately 15 minutes were spent on the discussion below.  ASK: We discussed the diagnosis of obesity with Lawrencia today and Tanya agreed to give [name] permission to discuss obesity behavioral modification therapy today.  ASSESS: Christy has the diagnosis of obesity and her BMI today is 38.7. Hailynn is in the action stage of change.   ADVISE: Briggitte was educated on the multiple health risks of obesity as well as the benefit of weight loss to improve her health. She was advised of the need for long term treatment and the importance of lifestyle modifications to improve her current health and to decrease her risk of future health problems.  AGREE: Multiple dietary modification options and treatment options were discussed and Kerith agreed to follow the  recommendations documented in the above note.  ARRANGE: Kayly was educated on the importance of frequent visits to treat obesity as outlined per CMS and USPSTF guidelines and agreed to schedule her next follow up appointment today.  Attestation Statements:   Reviewed by clinician on day of visit: allergies, medications, problem list, medical history, surgical history, family history, social history, and previous encounter notes.  I, Water quality scientist, CMA, am acting as transcriptionist for [name], DO  I have reviewed the above documentation for accuracy and completeness, and I agree with the above. [name], DO

## 2020-03-20 ENCOUNTER — Other Ambulatory Visit: Payer: Self-pay

## 2020-03-20 ENCOUNTER — Telehealth (HOSPITAL_BASED_OUTPATIENT_CLINIC_OR_DEPARTMENT_OTHER): Payer: Medicare Other | Admitting: Psychiatry

## 2020-03-20 DIAGNOSIS — F251 Schizoaffective disorder, depressive type: Secondary | ICD-10-CM | POA: Diagnosis not present

## 2020-03-20 DIAGNOSIS — F429 Obsessive-compulsive disorder, unspecified: Secondary | ICD-10-CM

## 2020-03-20 NOTE — Progress Notes (Signed)
Virtual Visit via Telephone Note  I connected with Jeanne [name] on 06/04/21 at  9:30 AM EST by telephone and verified that I am speaking with the correct person using two identifiers.  Location: Patient: home Provider: office   I discussed the limitations, risks, security and privacy concerns of performing an evaluation and management service by telephone and the availability of in person appointments. I also discussed with the patient that there may be a patient responsible charge related to this service. The patient expressed understanding and agreed to proceed.   History of Present Illness: Jeanne [name] shares that she is doing well and has no concerns at this time. She denies depression and anhedonia. She denies AVH and paranoia. She is taking her meds as prescribed.    Observations/Objective:  General Appearance: unable to assess  Eye Contact:  unable to assess  Speech:  Clear and Coherent and Normal Rate  Volume:  Normal  Mood:  Euthymic  Affect:  Full Range  Thought Process:  Goal Directed, Linear, and Descriptions of Associations: Intact  Orientation:  Full (Time, Place, and Person)  Thought Content:  Logical  Suicidal Thoughts:  No  Homicidal Thoughts:  No  Memory:  Immediate;   Good  Judgement:  Good  Insight:  Good  Psychomotor Activity: unable to assess  Concentration:  Concentration: Good  Recall:  Good  Fund of Knowledge:  Good  Language:  Good  Akathisia:  unable to assess  Handed:  unable to assess  AIMS (if indicated):     Assets:  Communication Skills Desire for Improvement Financial Resources/Insurance Housing Leisure Time Resilience Social Support Talents/Skills Transportation Vocational/Educational  ADL's:  unable to assess  Cognition:  WNL  Sleep:        Assessment and Plan: 1. Schizoaffective disorder, depressive type (HCC)  2. Obsessive-compulsive disorder, unspecified type - continue current meds   Follow Up Instructions: In 5-6  months or sooner if needed   I discussed the assessment and treatment plan with the patient. The patient was provided an opportunity to ask questions and all were answered. The patient agreed with the plan and demonstrated an understanding of the instructions.   The patient was advised to call back or seek an in-person evaluation if the symptoms worsen or if the condition fails to improve as anticipated.  I provided 10 minutes of non-face-to-face time during this encounter.   Charlcie Cradle, MD

## 2020-03-21 MED ORDER — BUPROPION HCL ER (XL) 300 MG PO TB24: ORAL_TABLET | ORAL | 1 refills | Status: DC

## 2020-03-21 MED ORDER — DULOXETINE HCL 30 MG PO CPEP: 90.0000 mg | ORAL_CAPSULE | Freq: Every day | ORAL | 1 refills | Status: DC

## 2020-03-21 MED ORDER — ARIPIPRAZOLE 30 MG PO TABS: 30.0000 mg | ORAL_TABLET | Freq: Every day | ORAL | 1 refills | Status: DC

## 2020-03-21 MED ORDER — QUETIAPINE FUMARATE 300 MG PO TABS: 600.0000 mg | ORAL_TABLET | Freq: Every day | ORAL | 1 refills | Status: DC

## 2020-03-28 ENCOUNTER — Encounter (INDEPENDENT_AMBULATORY_CARE_PROVIDER_SITE_OTHER): Payer: Self-pay | Admitting: Family Medicine

## 2020-04-01 ENCOUNTER — Ambulatory Visit (INDEPENDENT_AMBULATORY_CARE_PROVIDER_SITE_OTHER): Payer: Medicare Other | Admitting: Family Medicine

## 2020-04-14 ENCOUNTER — Ambulatory Visit (INDEPENDENT_AMBULATORY_CARE_PROVIDER_SITE_OTHER): Payer: Medicare Other | Admitting: Psychology

## 2020-04-14 DIAGNOSIS — F251 Schizoaffective disorder, depressive type: Secondary | ICD-10-CM

## 2020-04-15 ENCOUNTER — Other Ambulatory Visit: Payer: Self-pay

## 2020-04-15 ENCOUNTER — Ambulatory Visit: Payer: Medicare Other | Admitting: Podiatry

## 2020-04-15 ENCOUNTER — Encounter: Payer: Self-pay | Admitting: Podiatry

## 2020-04-15 DIAGNOSIS — M79674 Pain in right toe(s): Secondary | ICD-10-CM

## 2020-04-15 DIAGNOSIS — L84 Corns and callosities: Secondary | ICD-10-CM

## 2020-04-15 DIAGNOSIS — M79675 Pain in left toe(s): Secondary | ICD-10-CM

## 2020-04-15 DIAGNOSIS — B351 Tinea unguium: Secondary | ICD-10-CM

## 2020-04-15 DIAGNOSIS — L600 Ingrowing nail: Secondary | ICD-10-CM

## 2020-04-15 DIAGNOSIS — E1169 Type 2 diabetes mellitus with other specified complication: Secondary | ICD-10-CM | POA: Diagnosis not present

## 2020-04-15 NOTE — Progress Notes (Signed)
Subjective: Jeanne [name] presents today for at risk foot care with history of diabetic neuropathy and callus(es) b/l feet and painful thick toenails that are difficult to trim. Painful toenails interfere with ambulation. Aggravating factors include wearing enclosed shoe gear. Pain is relieved with periodic professional debridement. Painful calluses are aggravated when weightbearing with and without shoegear. Pain is relieved with periodic professional debridement.  For today's visit, we had planned partial matrixectomies, but she states both great toes feel better since her last treatment with me.   She has seen Vascular and had lower extremity studies performed in May. Arterial studies showed no evidence of PAD.  Jeanne Penna, MD is patient's PCP. Last visit with Family Medicine was 01/02/2020.  Past Medical History:  Diagnosis Date  . Antiphospholipid antibody syndrome (HCC)   . Antiphospholipid antibody syndrome (HCC)   . Anxiety   . Arthritis   . Bilateral swelling of feet   . Carpal tunnel syndrome 01/02/2014   Bilateral  . Clotting disorder (HCC)   . Constipation   . Depression   . Diabetes mellitus type 2, insulin dependent (HCC) 02/26/2019  . Diabetes mellitus, type II (HCC)   . Difficult airway for intubation 03/13/2019  . Difficult intubation 07/09/2013   Glidescope used, see Anesthesia note  . DVT (deep venous thrombosis) (HCC) 2014   LEFT LEG  . Gastritis   . Glaucoma   . Heart murmur   . History of blood clots   . Hyperlipidemia   . Hypertension   . Hypokalemia 03/05/2013  . Kidney disease    stage ll  . Neuromuscular disorder (HCC)    " NEUROPATHY IN  MY HANDS"  . Obsessive-compulsive disorder   . Osteoarthritis   . Rheumatoid arthritis (HCC)   . Rheumatoid arthritis (HCC) 2016  . Schizo-affective psychosis (HCC)   . Schizoaffective disorder (HCC)   . Scleritis of both eyes   . Sleep apnea    Bipap  . Suppurative hidradenitis   . 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 Type 2 diabetes mellitus with stage 3a chronic kidney disease, without long-term current use of insulin (HCC) 01/29/2020  . Class 2 severe obesity with serious comorbidity and body mass index (BMI) of 39.0 to 39.9 in adult (HCC) 12/24/2019  . Hyperlipidemia associated with type 2 diabetes mellitus (HCC) 06/04/2019  . Stage 3a chronic kidney disease (HCC) 04/09/2019  . Vitamin D deficiency 04/09/2019  . Diabetes mellitus (HCC) 02/26/2019  . Chronic constipation, with Hx of ileus 02/26/2019  . Hypertension associated with diabetes (HCC) 02/26/2019  . Class 3 severe obesity with serious comorbidity and body mass index (BMI) of 40.0 to 44.9 in adult (HCC) 02/26/2019  . OCD (obsessive compulsive disorder) 04/10/2015  . Schizoaffective disorder, depressive type (HCC) 04/10/2015  . 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 DVT (deep venous thrombosis) (HCC) 03/22/2013  . Paresthesia 03/05/2013  . Obsessive compulsive disorder 03/05/2013  . Obstructive sleep apnea 03/05/2013  . Glaucoma 03/05/2013  . Connective tissue disorder (HCC) 03/05/2013    Past Surgical History:  Procedure Laterality Date  . BUNIONECTOMY    . CERVICAL CONIZATION W/BX    . COLONOSCOPY WITH PROPOFOL N/A 03/13/2019   Procedure: COLONOSCOPY WITH PROPOFOL;  Surgeon: Sherrilyn Rist, MD;  Location: [facility] ENDOSCOPY;  Service: Gastroenterology;  Laterality: N/A;  . LAPAROSCOPIC ADRENALECTOMY Left 07/09/2013   Procedure: LAPAROSCOPIC LEFT  ADRENALECTOMY;  Surgeon: Earnstine Regal, MD;  Location: [facility] ORS;  Service: General;  Laterality: Left;  . POLYPECTOMY  03/13/2019   Procedure: POLYPECTOMY;  Surgeon: Doran Stabler, MD;  Location: [facility];  Service: Gastroenterology;;  . sweat gland removal  1975    Current Outpatient Medications on File Prior to Visit  Medication Sig  Dispense Refill  . amLODipine (NORVASC) 5 MG tablet Take 5 mg by mouth every morning.     . ARIPiprazole (ABILIFY) 30 MG tablet Take 1 tablet (30 mg total) by mouth daily. 90 tablet 1  . aspirin EC 81 MG tablet Take 162 mg by mouth daily.    [name] atorvastatin (LIPITOR) 20 MG tablet Take 1 tablet by mouth daily.    [name] buPROPion (WELLBUTRIN XL) 300 MG 24 hr tablet TAKE 1 TABLET(300 MG) BY MOUTH EVERY MORNING 90 tablet 1  . DULoxetine (CYMBALTA) 30 MG capsule Take 3 capsules (90 mg total) by mouth daily. 270 capsule 1  . esomeprazole (NEXIUM) 40 MG capsule Take 40 mg by mouth daily before breakfast.    . furosemide (LASIX) 40 MG tablet Take 40 mg by mouth.    [name] glucosamine-chondroitin 500-400 MG tablet Take 2 tablets by mouth every morning.    . Insulin Pen Needle 32G X 4 MM MISC 1 each by Does not apply route once a week. 50 each 0  . Krill Oil 300 MG CAPS Take 300 mg by mouth daily.    [name] levobunolol (BETAGAN) 0.5 % ophthalmic solution Place 1 drop into both eyes every morning.     . linaclotide (LINZESS) 72 MCG capsule Take 1 capsule (72 mcg total) by mouth daily before breakfast. 30 capsule 1  . Multiple Vitamin (MULTIVITAMIN WITH MINERALS) TABS tablet Take 1 tablet by mouth daily.    [name] NIFEdipine (PROCARDIA-XL/ADALAT CC) 60 MG 24 hr tablet Take 1 tablet (60 mg total) by mouth daily. 30 tablet 1  . oxyCODONE-acetaminophen (PERCOCET/ROXICET) 5-325 MG tablet Take 1-2 tablets by mouth every 4 (four) hours as needed for severe pain.    [name] PRESCRIPTION MEDICATION Inhale 1 application into the lungs at bedtime. BiPAP setting 8/12    . QUEtiapine (SEROQUEL) 300 MG tablet Take 2 tablets (600 mg total) by mouth at bedtime. 180 tablet 1  . Semaglutide, 1 MG/DOSE, (OZEMPIC, 1 MG/DOSE,) 4 MG/3ML SOPN Inject 1 mg into the skin once a week. 9 mL 0  . Turmeric POWD Take 1,500 mcg by mouth daily at 12 noon.     . verapamil (CALAN-SR) 180 MG CR tablet Take 1 tablet (180 mg total) by mouth daily. 30 tablet 1  .  Vitamin D, Ergocalciferol, (DRISDOL) 1.25 MG (50000 UNIT) CAPS capsule TAKE 1 CAPSULE BY MOUTH EVERY 7 DAYS 4 capsule 0   Current Facility-Administered Medications on File Prior to Visit  Medication Dose Route Frequency Provider Last Rate Last Admin  . 0.9 %  sodium chloride infusion  500 mL Intravenous Once Doran Stabler, MD         Allergies  Allergen Reactions  . Ace Inhibitors Anaphylaxis  . Arava [Leflunomide] Other (See Comments)    Nephritis and kidney clearance issues  . Travatan [Travoprost] Other (See Comments)    Red eyes, irritation     Social History   Occupational History  . Occupation: Retired  Tobacco Use  . Smoking status: Current Every Day Smoker    Packs/day: 0.50    Years: 25.00    Pack years: 12.50  Types: Cigarettes  . Smokeless tobacco: Never Used  Vaping Use  . Vaping Use: Never used  Substance and Sexual Activity  . Alcohol use: Yes    Alcohol/week: 0.0 standard drinks    Comment: " drink just on weekends " Last drink was in the spring of 2016  . Drug use: No  . Sexual activity: Not Currently    Family History  Problem Relation Age of Onset  . Cancer Maternal Aunt        breast ca  . Breast cancer Maternal Aunt   . Heart attack Maternal Grandmother 54  . Heart attack Maternal Grandfather 62  . Hypertension Sister   . Hypertension Sister   . Diabetes Mother   . Hypertension Mother   . Hyperlipidemia Mother   . Stroke Mother   . Depression Mother   . Eating disorder Mother   . Hypertension Father   . Seizures Maternal Uncle   . Alcohol abuse Neg Hx   . Anxiety disorder Neg Hx   . Bipolar disorder Neg Hx   . Drug abuse Neg Hx   . Schizophrenia Neg Hx   . Colon cancer Neg Hx   . Esophageal cancer Neg Hx   . Stomach cancer Neg Hx   . Rectal cancer Neg Hx      There is no immunization history on file for this patient.   Objective: There were no vitals filed for this visit.  Jeanne [name] is a pleasant 59 y.o. African  American female, obese, in NAD. AAO X 3.  Vascular Examination: Capillary fill time to digits <3 seconds b/l lower extremities. Faintly palpable DP pulse(s) b/l lower extremities. Faintly palpable PT pulse(s) b/l lower extremities. Pedal hair present. Lower extremity skin temperature gradient within normal limits. No pain with calf compression b/l. Varicosities present b/l.  Dermatological Examination: Pedal skin with normal turgor, texture and tone bilaterally. No open wounds bilaterally. No interdigital macerations bilaterally. Toenails 1-5 b/l elongated, discolored, dystrophic, thickened, crumbly with subungual debris and tenderness to dorsal palpation. Hyperkeratotic lesion(s) L hallux, R hallux and submet head 1 left foot.  No erythema, no edema, no drainage, no fluctuance.  Musculoskeletal Examination: Normal muscle strength 5/5 to all lower extremity muscle groups bilaterally. No pain crepitus or joint limitation noted with ROM b/l. Hammertoes noted to the b/l lower extremities. Pes planus deformity noted b/l.  Patient ambulates independent of any assistive aids.  Neurological Examination: Protective sensation intact 5/5 intact bilaterally with 10g monofilament b/l. Vibratory sensation intact b/l.    Hemoglobin A1C Latest Ref Rng & Units 01/02/2020  HGBA1C 4.8 - 5.6 % 6.3(H)  Some recent data might be hidden   Assessment: 1. Pain due to onychomycosis of toenails of both feet   2. Ingrown toenail without infection   3. Type 2 diabetes mellitus with other specified complication, without long-term current use of insulin (HCC)   4. Callus     Plan: -Examined patient. -Continue diabetic foot care principles daily. -Calluses pared b/l hallux and submet head 1 left foot utilizing sterile scalpel blade without incident. Total =3 -Toenails 1-5 b/l were debrided in length and girth with sterile nail nippers and dremel without iatrogenic bleeding. Offending nail borders debrided and  curretaged L hallux and R hallux. Borders cleansed with alcohol. Antibiotic ointment applied. No further treatment required by patient. -We will defer matrixectomies of both borders of both great toes for now as she is doing better. -Patient to report any pedal injuries to medical professional immediately. -  Patient to continue soft, supportive shoe gear daily. -Patient/POA to call should there be question/concern in the interim.  Return in about 3 months (around 07/14/2020) for diabetic toenails, corn(s)/callus(es).  Freddie Breech, DPM

## 2020-04-22 ENCOUNTER — Ambulatory Visit (INDEPENDENT_AMBULATORY_CARE_PROVIDER_SITE_OTHER): Payer: Medicare Other | Admitting: Family Medicine

## 2020-04-22 ENCOUNTER — Other Ambulatory Visit: Payer: Self-pay

## 2020-04-22 ENCOUNTER — Encounter (INDEPENDENT_AMBULATORY_CARE_PROVIDER_SITE_OTHER): Payer: Self-pay | Admitting: Family Medicine

## 2020-04-22 VITALS — BP 133/70 | HR 90 | Temp 98.4°F | Ht 69.0 in | Wt 266.0 lb

## 2020-04-22 DIAGNOSIS — E559 Vitamin D deficiency, unspecified: Secondary | ICD-10-CM

## 2020-04-22 DIAGNOSIS — Z794 Long term (current) use of insulin: Secondary | ICD-10-CM

## 2020-04-22 DIAGNOSIS — K5909 Other constipation: Secondary | ICD-10-CM

## 2020-04-22 DIAGNOSIS — E1169 Type 2 diabetes mellitus with other specified complication: Secondary | ICD-10-CM | POA: Diagnosis not present

## 2020-04-22 DIAGNOSIS — Z6839 Body mass index (BMI) 39.0-39.9, adult: Secondary | ICD-10-CM | POA: Diagnosis not present

## 2020-04-23 MED ORDER — LINACLOTIDE 72 MCG PO CAPS: 72.0000 ug | ORAL_CAPSULE | Freq: Every day | ORAL | 0 refills | Status: DC

## 2020-04-23 MED ORDER — VITAMIN D (ERGOCALCIFEROL) 1.25 MG (50000 UNIT) PO CAPS: ORAL_CAPSULE | ORAL | 0 refills | Status: DC

## 2020-04-23 MED ORDER — OZEMPIC (1 MG/DOSE) 4 MG/3ML ~~LOC~~ SOPN: 1.0000 mg | PEN_INJECTOR | SUBCUTANEOUS | 0 refills | Status: DC

## 2020-04-24 NOTE — Progress Notes (Signed)
Chief Complaint:   OBESITY Jeanne [name] is here to discuss her progress with her obesity treatment plan along with follow-up of her obesity related diagnoses.   Today's visit was #: 20 Starting weight: 281 lbs Starting date: 02/26/2019 Today's weight: 266 lbs Today's date: 04/22/2020 Total lbs lost to date: 15 lbs Body mass index is 39.28 kg/m.  Total weight loss percentage to date: -5.34%  Interim History: Jeanne [name] will be getting her COVID booster tomorrow.  She was able to get her groceries and has been to the pool.  She says she indulged in emotional eating. Nutrition Plan: the Category 2 Plan for 15% of the time. Anit-obesity medications:  Ozempic.  Activity: Walking for 40 minutes 2 times per week.  Assessment/Plan:   1. Other constipation Counseling Getting to Good Bowel Health: Your goal is to have one soft bowel movement each day. Drink at least 8 glasses of water each day. Eat plenty of fiber (goal is over 25 grams each day). It is best to get most of your fiber from dietary sources which includes leafy green vegetables, fresh fruit, and whole grains. You may need to add fiber with the help of OTC fiber supplements. These include Metamucil, Citrucel, and Benefiber. If you are still having trouble, try adding Miralax or Magnesium Citrate. If all of these changes do not work, contact me.  - linaclotide (LINZESS) 72 MCG capsule; Take 1 capsule (72 mcg total) by mouth daily before breakfast.  Dispense: 90 capsule; Refill: 0  2. Vitamin D deficiency Not at goal. Current vitamin D is 29.7, tested on 01/02/2020. Optimal goal > 50 ng/dL.   Plan:  [x]   Continue Vitamin D @50 ,000 IU every week. []   Continue home supplement daily. [x]   Follow-up for routine testing of Vitamin D at least 2-3 times per year to avoid over-replacement.  - Refill Vitamin D, Ergocalciferol, (DRISDOL) 1.25 MG (50000 UNIT) CAPS capsule; TAKE 1 CAPSULE BY MOUTH EVERY 7 DAYS  Dispense: 4 capsule; Refill:  0  3. Type 2 diabetes mellitus with other specified complication, with long-term current use of insulin (HCC) Diabetes Mellitus: stable. Medication: Ozempic 1 mg subcutaneously weekly. Issues reviewed with her: blood sugar goals, complications of diabetes mellitus, hypoglycemia prevention and treatment, exercise, and nutrition.  Lab Results  Component Value Date   HGBA1C 6.3 (H) 01/02/2020   HGBA1C 6.3 (H) 02/26/2019   Lab Results  Component Value Date   LDLCALC 90 01/02/2020   CREATININE 1.18 (H) 01/02/2020   - Refill Semaglutide, 1 MG/DOSE, (OZEMPIC, 1 MG/DOSE,) 4 MG/3ML SOPN; Inject 1 mg into the skin once a week.  Dispense: 9 mL; Refill: 0  4. Class 2 severe obesity with serious comorbidity and body mass index (BMI) of 39.0 to 39.9 in adult, unspecified obesity type Jeanne [name])  Course: Jeanne [name] is currently in the action stage of change. As such, her goal is to continue with weight loss efforts.   Nutrition goals: She has agreed to the Category 2 Plan.   Exercise goals: For substantial health benefits, adults should do at least 150 minutes (2 hours and 30 minutes) a week of moderate-intensity, or 75 minutes (1 hour and 15 minutes) a week of vigorous-intensity aerobic physical activity, or an equivalent combination of moderate- and vigorous-intensity aerobic activity. Aerobic activity should be performed in episodes of at least 10 minutes, and preferably, it should be spread throughout the week.  Behavioral modification strategies: increasing lean protein intake, decreasing simple carbohydrates, increasing vegetables, increasing water intake and  emotional eating strategies.  Jeanne [name] has agreed to follow-up with our clinic in 3 weeks. She was informed of the importance of frequent follow-up visits to maximize her success with intensive lifestyle modifications for her multiple health conditions.   Objective:   Blood pressure 133/70, pulse 90, temperature 98.4 F (36.9 C), temperature  source Oral, height 5\' 9"  (1.753 m), weight 266 lb (120.7 kg), SpO2 98 %. Body mass index is 39.28 kg/m.  General: Cooperative, alert, well developed, in no acute distress. HEENT: Conjunctivae and lids unremarkable. Cardiovascular: Regular rhythm.  Lungs: Normal work of breathing. Neurologic: No focal deficits.   Lab Results  Component Value Date   CREATININE 1.18 (H) 01/02/2020   BUN 17 01/02/2020   NA 141 01/02/2020   K 4.5 01/02/2020   CL 102 01/02/2020   CO2 27 01/02/2020   Lab Results  Component Value Date   ALT 22 01/02/2020   AST 13 01/02/2020   ALKPHOS 131 (H) 01/02/2020   BILITOT 0.4 01/02/2020   Lab Results  Component Value Date   HGBA1C 6.3 (H) 01/02/2020   HGBA1C 6.3 (H) 02/26/2019   Lab Results  Component Value Date   INSULIN 21.0 02/26/2019   Lab Results  Component Value Date   TSH 0.783 01/02/2020   Lab Results  Component Value Date   CHOL 178 01/02/2020   HDL 74 01/02/2020   LDLCALC 90 01/02/2020   TRIG 78 01/02/2020   CHOLHDL 2.4 01/02/2020   Lab Results  Component Value Date   WBC 4.8 01/02/2020   HGB 14.9 01/02/2020   HCT 42.9 01/02/2020   MCV 95 01/02/2020   PLT 172 01/02/2020   Obesity Behavioral Intervention:   Approximately 15 minutes were spent on the discussion below.  ASK: We discussed the diagnosis of obesity with Jeanne [name] today and Jeanne [name] agreed to give 01/04/2020 permission to discuss obesity behavioral modification therapy today.  ASSESS: Jeanne [name] has the diagnosis of obesity and her BMI today is 39.3. Jeanne [name] is in the action stage of change.   ADVISE: Jeanne [name] was educated on the multiple health risks of obesity as well as the benefit of weight loss to improve her health. She was advised of the need for long term treatment and the importance of lifestyle modifications to improve her current health and to decrease her risk of future health problems.  AGREE: Multiple dietary modification options and treatment options  were discussed and Jeanne [name] agreed to follow the recommendations documented in the above note.  ARRANGE: Jeanne [name] was educated on the importance of frequent visits to treat obesity as outlined per CMS and USPSTF guidelines and agreed to schedule her next follow up appointment today.  Attestation Statements:   Reviewed by clinician on day of visit: allergies, medications, problem list, medical history, surgical history, family history, social history, and previous encounter notes.  I, Elonda Husky, CMA, am acting as transcriptionist for Insurance claims handler, DO  I have reviewed the above documentation for accuracy and completeness, and I agree with the above. Helane Rima, DO

## 2020-05-12 ENCOUNTER — Other Ambulatory Visit (INDEPENDENT_AMBULATORY_CARE_PROVIDER_SITE_OTHER): Payer: Medicare Other | Admitting: Psychology

## 2020-05-12 DIAGNOSIS — F251 Schizoaffective disorder, depressive type: Secondary | ICD-10-CM | POA: Diagnosis not present

## 2020-05-14 ENCOUNTER — Encounter (INDEPENDENT_AMBULATORY_CARE_PROVIDER_SITE_OTHER): Payer: Self-pay | Admitting: Family Medicine

## 2020-05-14 ENCOUNTER — Ambulatory Visit (INDEPENDENT_AMBULATORY_CARE_PROVIDER_SITE_OTHER): Payer: Medicare Other | Admitting: Family Medicine

## 2020-05-14 ENCOUNTER — Other Ambulatory Visit: Payer: Self-pay

## 2020-05-14 VITALS — BP 122/67 | HR 76 | Temp 98.1°F | Ht 69.0 in | Wt 267.0 lb

## 2020-05-14 DIAGNOSIS — K5909 Other constipation: Secondary | ICD-10-CM

## 2020-05-14 DIAGNOSIS — F3289 Other specified depressive episodes: Secondary | ICD-10-CM | POA: Diagnosis not present

## 2020-05-14 DIAGNOSIS — Z6839 Body mass index (BMI) 39.0-39.9, adult: Secondary | ICD-10-CM | POA: Diagnosis not present

## 2020-05-14 DIAGNOSIS — Z8709 Personal history of other diseases of the respiratory system: Secondary | ICD-10-CM | POA: Diagnosis not present

## 2020-05-14 DIAGNOSIS — R430 Anosmia: Secondary | ICD-10-CM | POA: Diagnosis not present

## 2020-05-15 NOTE — Progress Notes (Signed)
Chief Complaint:   OBESITY Jeanne [name] is here to discuss her progress with her obesity treatment plan along with follow-up of her obesity related diagnoses.   Today's visit was #: 21 Starting weight: 281 lbs  Starting date: 02/26/2019 Today's weight: 267 lbs Today's date: 05/14/2020 Total lbs lost to date: 14 lbs Body mass index is 39.43 kg/m.  Total weight loss percentage to date: -4.98%  Interim History: Danene says she was down for 12 days for bronchitis.  She was given antibiotics, but no steroids.  She says she has been focusing on healthy eating habits.  She is feeling hopeful again.  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  She says she is having trouble with eating what she has prepped.  Nutrition Plan: the Category 2 Plan for 25% of the time.  Anti-obesity medications:  Ozempic. Activity: Walking for 40 minutes 2 times per week.  Assessment/Plan:   1. Anosmia Discussed spices and non-food-related rewards. We will continue to monitor symptoms as they relate to her weight loss journey.  2. Other constipation Controlled.    Getting to Good Bowel Health: Your goal is to have one soft bowel movement each day. Drink at least 8 glasses of water each day. Eat plenty of fiber (goal is over 30 grams each day). It is best to get most of your fiber from dietary sources which includes leafy green vegetables, fresh fruit, and whole grains. You may need to add fiber with the help of OTC fiber supplements. These include Metamucil, Citrucel, and Benefiber. If you are still having trouble, try adding Miralax. If all of these changes do not work, contact me for further direction.  3. H/O bronchitis, recent Improved. We will continue to monitor symptoms as they relate to her weight loss journey.  4. Other depression, with emotional eating Cathlin is taking Wellbutrin XL 300 mg daily. Motivational interviewing as well as evidence-based  interventions for health behavior change were utilized today including the discussion of self monitoring techniques, problem-solving barriers and SMART goal setting techniques.   5. Class 2 severe obesity with serious comorbidity and body mass index (BMI) of 39.0 to 39.9 in adult, unspecified obesity type [facility])  Course: Valita is currently in the action stage of change. As such, her goal is to continue with weight loss efforts.   Nutrition goals: She has agreed to keeping a food journal and adhering to recommended goals of 1200 calories and 95 grams of protein.   Exercise goals: For substantial health benefits, adults should do at least 150 minutes (2 hours and 30 minutes) a week of moderate-intensity, or 75 minutes (1 hour and 15 minutes) a week of vigorous-intensity aerobic physical activity, or an equivalent combination of moderate- and vigorous-intensity aerobic activity. Aerobic activity should be performed in episodes of at least 10 minutes, and preferably, it should be spread throughout the week.  Behavioral modification strategies: increasing lean protein intake, decreasing simple carbohydrates, increasing vegetables and increasing water intake.  Rhetta has agreed to follow-up with our clinic in 3 weeks. She was informed of the importance of frequent follow-up visits to maximize her success with intensive lifestyle modifications for her multiple health conditions.   Objective:   Blood pressure 122/67, pulse 76, temperature 98.1 F (36.7 C), temperature source Oral, height 5\' 9"  (1.753 m), weight 267 lb (121.1 kg), SpO2 97 %. Body mass index is 39.43 kg/m.  General: Cooperative, alert, well developed, in no acute distress. HEENT:  Conjunctivae and lids unremarkable. Cardiovascular: Regular rhythm.  Lungs: Normal work of breathing. Neurologic: No focal deficits.   Lab Results  Component Value Date   CREATININE 1.18 (H) 01/02/2020   BUN 17 01/02/2020   NA 141 01/02/2020   K  4.5 01/02/2020   CL 102 01/02/2020   CO2 27 01/02/2020   Lab Results  Component Value Date   ALT 22 01/02/2020   AST 13 01/02/2020   ALKPHOS 131 (H) 01/02/2020   BILITOT 0.4 01/02/2020   Lab Results  Component Value Date   HGBA1C 6.3 (H) 01/02/2020   HGBA1C 6.3 (H) 02/26/2019   Lab Results  Component Value Date   INSULIN 21.0 02/26/2019   Lab Results  Component Value Date   TSH 0.783 01/02/2020   Lab Results  Component Value Date   CHOL 178 01/02/2020   HDL 74 01/02/2020   LDLCALC 90 01/02/2020   TRIG 78 01/02/2020   CHOLHDL 2.4 01/02/2020   Lab Results  Component Value Date   WBC 4.8 01/02/2020   HGB 14.9 01/02/2020   HCT 42.9 01/02/2020   MCV 95 01/02/2020   PLT 172 01/02/2020   Obesity Behavioral Intervention:   Approximately 15 minutes were spent on the discussion below.  ASK: We discussed the diagnosis of obesity with Alle today and Kayana agreed to give [name] permission to discuss obesity behavioral modification therapy today.  ASSESS: Laylana has the diagnosis of obesity and her BMI today is 39.5. Thressa is in the action stage of change.   ADVISE: Rozena was educated on the multiple health risks of obesity as well as the benefit of weight loss to improve her health. She was advised of the need for long term treatment and the importance of lifestyle modifications to improve her current health and to decrease her risk of future health problems.  AGREE: Multiple dietary modification options and treatment options were discussed and Tene agreed to follow the recommendations documented in the above note.  ARRANGE: Enrique was educated on the importance of frequent visits to treat obesity as outlined per CMS and USPSTF guidelines and agreed to schedule her next follow up appointment today.  Attestation Statements:   Reviewed by clinician on day of visit: allergies, medications, problem list, medical history, surgical history, family  history, social history, and previous encounter notes.  I, Water quality scientist, CMA, am acting as transcriptionist for [name], DO  I have reviewed the above documentation for accuracy and completeness, and I agree with the above. [name], DO

## 2020-06-04 ENCOUNTER — Other Ambulatory Visit: Payer: Self-pay

## 2020-06-04 ENCOUNTER — Ambulatory Visit (INDEPENDENT_AMBULATORY_CARE_PROVIDER_SITE_OTHER): Payer: Medicare Other | Admitting: Family Medicine

## 2020-06-04 ENCOUNTER — Encounter (INDEPENDENT_AMBULATORY_CARE_PROVIDER_SITE_OTHER): Payer: Self-pay | Admitting: Family Medicine

## 2020-06-04 VITALS — BP 118/64 | HR 84 | Temp 98.0°F | Ht 69.0 in | Wt 269.0 lb

## 2020-06-04 DIAGNOSIS — I10 Essential (primary) hypertension: Secondary | ICD-10-CM

## 2020-06-04 DIAGNOSIS — E559 Vitamin D deficiency, unspecified: Secondary | ICD-10-CM | POA: Diagnosis not present

## 2020-06-04 DIAGNOSIS — E66812 Obesity, class 2: Secondary | ICD-10-CM

## 2020-06-04 DIAGNOSIS — E7849 Other hyperlipidemia: Secondary | ICD-10-CM

## 2020-06-04 DIAGNOSIS — Z6839 Body mass index (BMI) 39.0-39.9, adult: Secondary | ICD-10-CM | POA: Diagnosis not present

## 2020-06-04 DIAGNOSIS — F251 Schizoaffective disorder, depressive type: Secondary | ICD-10-CM

## 2020-06-04 MED ORDER — VITAMIN D (ERGOCALCIFEROL) 1.25 MG (50000 UNIT) PO CAPS: ORAL_CAPSULE | ORAL | 0 refills | Status: DC

## 2020-06-09 NOTE — Progress Notes (Signed)
Chief Complaint:   OBESITY Jeanne [name] is here to discuss her progress with her obesity treatment plan along with follow-up of her obesity related diagnoses.   Today's visit was #: 22 Starting weight: 281 lbs Starting date: 02/26/2019 Today's weight: 269 lbs Today's date: 06/04/2020 Total lbs lost to date: 12 lbs Body mass index is 39.72 kg/m.  Total weight loss percentage to date: -4.27%  Interim History: Jeanne [name] has increased edema. She says she is still struggling with depression. She has a Teacher, music, Interior and spatial designer. Therapy with Trey Paula. Enbrel changed to Inflectra. 3 hour infusions cover by manufacturer. She has not had Enbrel in a year.  Nutrition Plan: the Category 2 plan 15% of the time. Anti-obesity medications: Ozempic. Reported side effects: None. Activity: Walking for 40 minutes 2 times per week.  Assessment/Plan:   1. Vitamin D deficiency Not at goal. Current vitamin D is 29.7, tested on 01/02/2020. Optimal goal > 50 ng/dL.   Plan: Continue to take prescription Vitamin D @50 ,000 IU every week as prescribed.  Follow-up for routine testing of Vitamin D, at least 2-3 times per year to avoid over-replacement.   - Refill Vitamin D, Ergocalciferol, (DRISDOL) 1.25 MG (50000 UNIT) CAPS capsule; TAKE 1 CAPSULE BY MOUTH EVERY 7 DAYS  Dispense: 4 capsule; Refill: 0  2. Essential hypertension At goal. Medications: Norvasc, nifedipine, and verapamil. Plan: Avoid buying foods that are: processed, frozen, or prepackaged to avoid excess salt. We will continue to monitor closely alongside her PCP and/or Specialist.  Regular follow up with PCP and specialists was also encouraged.   BP Readings from Last 3 Encounters:  06/04/20 118/64  05/14/20 122/67  04/22/20 133/70   Lab Results  Component Value Date   CREATININE 1.18 (H) 01/02/2020    3. Other hyperlipidemia Course: At goal. Lipid-lowering medications: Lipitor. Plan: Dietary changes: Increase soluble fiber, decrease  simple carbohydrates, decrease saturated fat. Exercise changes: Moderate to vigorous-intensity aerobic activity 150 minutes per week or as tolerated. We will continue to monitor along with PCP/specialists as it pertains to her weight loss journey.  Lab Results  Component Value Date   CHOL 178 01/02/2020   HDL 74 01/02/2020   LDLCALC 90 01/02/2020   TRIG 78 01/02/2020   CHOLHDL 2.4 01/02/2020   Lab Results  Component Value Date   ALT 22 01/02/2020   AST 13 01/02/2020   ALKPHOS 131 (H) 01/02/2020   BILITOT 0.4 01/02/2020   The 10-year ASCVD risk score Mikey Bussing DC Jr., et al., 2013) is: 18.1%   Values used to calculate the score:     Age: 60 years     Sex: Female     Is Non-Hispanic African American: Yes     Diabetic: Yes     Tobacco smoker: Yes     Systolic Blood Pressure: 161 mmHg     Is BP treated: Yes     HDL Cholesterol: 74 mg/dL     Total Cholesterol: 178 mg/dL  4. Schizoaffective disorder, depressive type ([place]) Medication: Wellbutrin, Cymbalta, and Seroquel. She is currently followed by psychiatry. Encouraged call to doctor and Licensed [place] representative. Consider partial hospitalization program.  5. Class 2 severe obesity with serious comorbidity and body mass index (BMI) of 39.0 to 39.9 in adult, unspecified obesity type ([place]) Course: Jeanne [name] is currently in the action stage of change. As such, her goal is to continue with weight loss efforts.   Nutrition goals: She has agreed to the Category 2 Plan.   Exercise goals:  swimming  Behavioral modification strategies: increasing lean protein intake, decreasing simple carbohydrates, increasing vegetables and emotional eating strategies.  Jeanne [name] has agreed to follow-up with our clinic in 2 weeks. She was informed of the importance of frequent follow-up visits to maximize her success with intensive lifestyle modifications for her multiple health conditions.   Objective:   Blood pressure 118/64, pulse 84, temperature  98 F (36.7 C), temperature source Oral, height 5\' 9"  (1.753 m), weight 269 lb (122 kg), SpO2 97 %. Body mass index is 39.72 kg/m.  General: Cooperative, alert, well developed, in no acute distress. HEENT: Conjunctivae and lids unremarkable. Cardiovascular: Regular rhythm.  Lungs: Normal work of breathing. Neurologic: No focal deficits.   Lab Results  Component Value Date   CREATININE 1.18 (H) 01/02/2020   BUN 17 01/02/2020   NA 141 01/02/2020   K 4.5 01/02/2020   CL 102 01/02/2020   CO2 27 01/02/2020   Lab Results  Component Value Date   ALT 22 01/02/2020   AST 13 01/02/2020   ALKPHOS 131 (H) 01/02/2020   BILITOT 0.4 01/02/2020   Lab Results  Component Value Date   HGBA1C 6.3 (H) 01/02/2020   HGBA1C 6.3 (H) 02/26/2019   Lab Results  Component Value Date   INSULIN 21.0 02/26/2019   Lab Results  Component Value Date   TSH 0.783 01/02/2020   Lab Results  Component Value Date   CHOL 178 01/02/2020   HDL 74 01/02/2020   LDLCALC 90 01/02/2020   TRIG 78 01/02/2020   CHOLHDL 2.4 01/02/2020   Lab Results  Component Value Date   WBC 4.8 01/02/2020   HGB 14.9 01/02/2020   HCT 42.9 01/02/2020   MCV 95 01/02/2020   PLT 172 01/02/2020   Obesity Behavioral Intervention:   Approximately 15 minutes were spent on the discussion below.  ASK: We discussed the diagnosis of obesity with Jeanne [name] today and Jeanne [name] agreed to give [name] permission to discuss obesity behavioral modification therapy today.  ASSESS: Jeanne [name] has the diagnosis of obesity and her BMI today is 39.8. Jeanne [name] is in the action stage of change.   ADVISE: Jeanne [name] was educated on the multiple health risks of obesity as well as the benefit of weight loss to improve her health. She was advised of the need for long term treatment and the importance of lifestyle modifications to improve her current health and to decrease her risk of future health problems.  AGREE: Multiple dietary modification  options and treatment options were discussed and Jeanne [name] agreed to follow the recommendations documented in the above note.  ARRANGE: Jeanne [name] was educated on the importance of frequent visits to treat obesity as outlined per CMS and USPSTF guidelines and agreed to schedule her next follow up appointment today.  Attestation Statements:   Reviewed by clinician on day of visit: allergies, medications, problem list, medical history, surgical history, family history, social history, and previous encounter notes.  Leodis Binet Friedenbach, CMA, am acting as Location manager for [employer], DO.  I have reviewed the above documentation for accuracy and completeness, and I agree with the above. [name], DO

## 2020-06-12 ENCOUNTER — Encounter: Payer: Self-pay | Admitting: Podiatry

## 2020-06-16 NOTE — Telephone Encounter (Signed)
Called patient, appt scheduled.

## 2020-06-18 ENCOUNTER — Ambulatory Visit (INDEPENDENT_AMBULATORY_CARE_PROVIDER_SITE_OTHER): Payer: Medicare Other | Admitting: Family Medicine

## 2020-06-18 ENCOUNTER — Ambulatory Visit (INDEPENDENT_AMBULATORY_CARE_PROVIDER_SITE_OTHER): Payer: Medicare Other | Admitting: Psychology

## 2020-06-18 DIAGNOSIS — F251 Schizoaffective disorder, depressive type: Secondary | ICD-10-CM

## 2020-06-19 ENCOUNTER — Encounter: Payer: Self-pay | Admitting: Podiatry

## 2020-06-19 ENCOUNTER — Telehealth (INDEPENDENT_AMBULATORY_CARE_PROVIDER_SITE_OTHER): Payer: Medicare Other | Admitting: Psychiatry

## 2020-06-19 ENCOUNTER — Ambulatory Visit: Payer: Medicare Other | Admitting: Podiatry

## 2020-06-19 ENCOUNTER — Other Ambulatory Visit: Payer: Self-pay

## 2020-06-19 DIAGNOSIS — F429 Obsessive-compulsive disorder, unspecified: Secondary | ICD-10-CM | POA: Diagnosis not present

## 2020-06-19 DIAGNOSIS — F251 Schizoaffective disorder, depressive type: Secondary | ICD-10-CM

## 2020-06-19 DIAGNOSIS — L600 Ingrowing nail: Secondary | ICD-10-CM

## 2020-06-19 MED ORDER — DULOXETINE HCL 30 MG PO CPEP
90.0000 mg | ORAL_CAPSULE | Freq: Every day | ORAL | 0 refills | Status: DC
Start: 2020-06-19 — End: 2020-07-24

## 2020-06-19 MED ORDER — QUETIAPINE FUMARATE 300 MG PO TABS: 600.0000 mg | ORAL_TABLET | Freq: Every day | ORAL | 0 refills | Status: DC

## 2020-06-19 MED ORDER — BUPROPION HCL ER (XL) 150 MG PO TB24: 450.0000 mg | ORAL_TABLET | Freq: Every day | ORAL | 0 refills | Status: DC

## 2020-06-19 MED ORDER — ARIPIPRAZOLE 30 MG PO TABS: 30.0000 mg | ORAL_TABLET | Freq: Every day | ORAL | 0 refills | Status: DC

## 2020-06-19 NOTE — Patient Instructions (Signed)

## 2020-06-19 NOTE — Progress Notes (Signed)
Virtual Visit via Telephone Note  I connected with Jeanne [name] on 06/19/20 at  2:30 PM EST by telephone and verified that I am speaking with the correct person using two identifiers.  Location: Patient: home Provider: office   I discussed the limitations, risks, security and privacy concerns of performing an evaluation and management service by telephone and the availability of in person appointments. I also discussed with the patient that there may be a patient responsible charge related to this service. The patient expressed understanding and agreed to proceed.   History of Present Illness: Jeanne [name] notes that in January she began to feel depressed. She is feeling sad and down. Jeanne [name] is reporting low motivation and has poor energy. She is sleeping ok. She denies isolating and spending more time in bed. She denies hopelessness. She denies SI/HI. She is denying anxiety. Jeanne [name] denies AVH, paranoia and ideas of reference.    Observations/Objective:  General Appearance: unable to assess  Eye Contact:  unable to assess  Speech:  Clear and Coherent and Normal Rate  Volume:  Normal  Mood:  Depressed  Affect:  Congruent  Thought Process:  Goal Directed, Linear and Descriptions of Associations: Intact  Orientation:  Full (Time, Place, and Person)  Thought Content:  Logical  Suicidal Thoughts:  No  Homicidal Thoughts:  No  Memory:  Immediate;   Good  Judgement:  Good  Insight:  Good  Psychomotor Activity: unable to assess  Concentration:  Concentration: Good  Recall:  Good  Fund of Knowledge:  Good  Language:  Good  Akathisia:  unable to assess  Handed:  Right  AIMS (if indicated):     Assets:  Communication Skills Desire for Improvement Financial Resources/Insurance Housing Leisure Time Resilience Social Support Talents/Skills Transportation Vocational/Educational  ADL's:  unable to assess  Cognition:  WNL  Sleep:         Assessment and Plan: 1.  Schizoaffective disorder, depressive type (HCC) - ARIPiprazole (ABILIFY) 30 MG tablet; Take 1 tablet (30 mg total) by mouth daily.  Dispense: 90 tablet; Refill: 0 - increase buPROPion (WELLBUTRIN XL) 150 MG 24 hr tablet; Take 3 tablets (450 mg total) by mouth daily.  Dispense: 270 tablet; Refill: 0 - DULoxetine (CYMBALTA) 30 MG capsule; Take 3 capsules (90 mg total) by mouth daily.  Dispense: 270 capsule; Refill: 0 - QUEtiapine (SEROQUEL) 300 MG tablet; Take 2 tablets (600 mg total) by mouth at bedtime.  Dispense: 180 tablet; Refill: 0  2. Obsessive-compulsive disorder, unspecified type - DULoxetine (CYMBALTA) 30 MG capsule; Take 3 capsules (90 mg total) by mouth daily.  Dispense: 270 capsule; Refill: 0    Labs: reviewed 01/02/20  CMP with Bun/Creatinine elevated- has an appointment with Nephrology in April, TSH- wnl,  CBC wnl,  HbA1c 6.3 working with PCP, Lipid panel- wnl,   ordered EKG -engaging in therapy and reporting good benefit  Depression screen [facility] 2/9 06/19/2020 02/26/2019  Decreased Interest 2 3  Down, Depressed, Hopeless 3 3  PHQ - 2 Score 5 6  Altered sleeping 0 0  Tired, decreased energy 3 3  Change in appetite 2 2  Feeling bad or failure about yourself  0 2  Trouble concentrating 0 0  Moving slowly or fidgety/restless 0 0  Suicidal thoughts 0 0  PHQ-9 Score 10 13  Difficult doing work/chores Somewhat difficult Somewhat difficult     Follow Up Instructions: In 4-6 weeks or sooner if needed   I discussed the assessment and treatment plan with the patient.  The patient was provided an opportunity to ask questions and all were answered. The patient agreed with the plan and demonstrated an understanding of the instructions.   The patient was advised to call back or seek an in-person evaluation if the symptoms worsen or if the condition fails to improve as anticipated.  I provided 21 minutes of non-face-to-face time during this encounter.   Charlcie Cradle, MD

## 2020-06-23 ENCOUNTER — Encounter (INDEPENDENT_AMBULATORY_CARE_PROVIDER_SITE_OTHER): Payer: Self-pay | Admitting: Family Medicine

## 2020-06-23 ENCOUNTER — Ambulatory Visit (INDEPENDENT_AMBULATORY_CARE_PROVIDER_SITE_OTHER): Payer: Medicare Other | Admitting: Family Medicine

## 2020-06-23 ENCOUNTER — Other Ambulatory Visit: Payer: Self-pay

## 2020-06-23 VITALS — BP 107/62 | HR 83 | Temp 98.5°F | Ht 69.0 in | Wt 270.0 lb

## 2020-06-23 DIAGNOSIS — E559 Vitamin D deficiency, unspecified: Secondary | ICD-10-CM | POA: Diagnosis not present

## 2020-06-23 DIAGNOSIS — F3289 Other specified depressive episodes: Secondary | ICD-10-CM | POA: Diagnosis not present

## 2020-06-23 DIAGNOSIS — Z6839 Body mass index (BMI) 39.0-39.9, adult: Secondary | ICD-10-CM | POA: Diagnosis not present

## 2020-06-23 DIAGNOSIS — K5909 Other constipation: Secondary | ICD-10-CM | POA: Diagnosis not present

## 2020-06-23 MED ORDER — VITAMIN D (ERGOCALCIFEROL) 1.25 MG (50000 UNIT) PO CAPS: ORAL_CAPSULE | ORAL | 0 refills | Status: DC

## 2020-06-23 NOTE — Progress Notes (Signed)
Chief Complaint:   OBESITY Jeanne [name] is here to discuss her progress with her obesity treatment plan along with follow-up of her obesity related diagnoses.   Today's visit was #: 23 Starting weight: 281 lbs Starting date: 02/26/2019 Today's weight: 270 lbs Today's date: 06/23/2020 Total lbs lost to date: 11 lbs Body mass index is 39.87 kg/m.  Total weight loss percentage to date: -3.91%  Interim History:  Jeanne [name] says, "I'm always constipated".  She will be getting her second iron infusion tomorrow.  She says that her psychiatrist increased her Wellbutrin to 450 mg daily.  Her therapist, Lattie Haw, will be retiring in May, so she is looking for a new therapist.  She says she wants a "straight shooter".  Location does not matter.  She wants to be seen every couple of weeks.  She says her eating patterns are stable.  Today's bioimpedance results indicate that Jeanne [name] has gained 5 pounds of water weight since her last visit.  Current Meal Plan: the Category 2 Plan for 30% of the time.  Current Exercise Plan: Walking for 40 minutes 2 times per week. Current Anti-Obesity Medications: Ozempic 1 mg subcutaneously weekly. Side effects: None.  Assessment/Plan:   1. Vitamin D deficiency Not at goal. Current vitamin D is 29.7, tested on 01/02/2020. Optimal goal > 50 ng/dL.   Plan: Continue to take prescription Vitamin D @50 ,000 IU every week as prescribed.  Follow-up for routine testing of Vitamin D, at least 2-3 times per year to avoid over-replacement.  - Refill Vitamin D, Ergocalciferol, (DRISDOL) 1.25 MG (50000 UNIT) CAPS capsule; TAKE 1 CAPSULE BY MOUTH EVERY 7 DAYS  Dispense: 4 capsule; Refill: 0  2. Other constipation This problem is stable.   [name] was informed that a decrease in bowel movement frequency is normal while losing weight, but stools should not be hard or painful.  Counseling: Getting to Good Bowel Health: Your goal is to have one soft bowel movement each day. Drink  at least 8 glasses of water each day. Eat plenty of fiber (goal is over 30 grams each day). It is best to get most of your fiber from dietary sources which includes leafy green vegetables, fresh fruit, and whole grains. You may need to add fiber with the help of OTC fiber supplements. These include Metamucil, Citrucel, and Benefiber. If you are still having trouble, try adding an osmotic laxative such as Miralax. If all of these changes do not work, Cabin crew.   3. Other depression, with emotional eating Controlled. Medication: Ozempic 1 mg subcutaneously weekly, Wellbutrin XL 450 mg daily.  Plan:  Behavior modification techniques were discussed today to help deal with emotional/non-hunger eating behaviors.  4. Class 2 severe obesity with serious comorbidity and body mass index (BMI) of 39.0 to 39.9 in adult, unspecified obesity type ([place])  Course: Jeanne [name] is currently in the action stage of change. As such, her goal is to continue with weight loss efforts.   Nutrition goals: She has agreed to keeping a food journal and adhering to recommended goals of 1200 calories and 95 grams of protein.   Exercise goals: As is.  Behavioral modification strategies: increasing lean protein intake, decreasing simple carbohydrates, increasing vegetables, increasing water intake and emotional eating strategies.  [name] has agreed to follow-up with our clinic in 2 weeks. She was informed of the importance of frequent follow-up visits to maximize her success with intensive lifestyle modifications for her multiple health conditions.   Objective:   Blood pressure  107/62, pulse 83, temperature 98.5 F (36.9 C), height 5\' 9"  (1.753 m), weight 270 lb (122.5 kg), SpO2 97 %. Body mass index is 39.87 kg/m.  General: Cooperative, alert, well developed, in no acute distress. HEENT: Conjunctivae and lids unremarkable. Cardiovascular: Regular rhythm.  Lungs: Normal work of breathing. Neurologic: No  focal deficits.   Lab Results  Component Value Date   CREATININE 1.18 (H) 01/02/2020   BUN 17 01/02/2020   NA 141 01/02/2020   K 4.5 01/02/2020   CL 102 01/02/2020   CO2 27 01/02/2020   Lab Results  Component Value Date   ALT 22 01/02/2020   AST 13 01/02/2020   ALKPHOS 131 (H) 01/02/2020   BILITOT 0.4 01/02/2020   Lab Results  Component Value Date   HGBA1C 6.3 (H) 01/02/2020   HGBA1C 6.3 (H) 02/26/2019   Lab Results  Component Value Date   INSULIN 21.0 02/26/2019   Lab Results  Component Value Date   TSH 0.783 01/02/2020   Lab Results  Component Value Date   CHOL 178 01/02/2020   HDL 74 01/02/2020   LDLCALC 90 01/02/2020   TRIG 78 01/02/2020   CHOLHDL 2.4 01/02/2020   Lab Results  Component Value Date   WBC 4.8 01/02/2020   HGB 14.9 01/02/2020   HCT 42.9 01/02/2020   MCV 95 01/02/2020   PLT 172 01/02/2020   Obesity Behavioral Intervention:   Approximately 15 minutes were spent on the discussion below.  ASK: We discussed the diagnosis of obesity with Jeanne [name] today and [name] agreed to give [name] permission to discuss obesity behavioral modification therapy today.  ASSESS: Jeanne [name] has the diagnosis of obesity and her BMI today is 39.9. Jeanne [name] is in the action stage of change.   ADVISE: [name] was educated on the multiple health risks of obesity as well as the benefit of weight loss to improve her health. She was advised of the need for long term treatment and the importance of lifestyle modifications to improve her current health and to decrease her risk of future health problems.  AGREE: Multiple dietary modification options and treatment options were discussed and Jeanne [name] agreed to follow the recommendations documented in the above note.  ARRANGE: Jeanne [name] was educated on the importance of frequent visits to treat obesity as outlined per CMS and USPSTF guidelines and agreed to schedule her next follow up appointment today.  Attestation  Statements:   Reviewed by clinician on day of visit: allergies, medications, problem list, medical history, surgical history, family history, social history, and previous encounter notes.  I, Water quality scientist, CMA, am acting as transcriptionist for [name], DO  I have reviewed the above documentation for accuracy and completeness, and I agree with the above. [name], DO

## 2020-06-25 NOTE — Progress Notes (Signed)
Subjective:   Patient ID: Jeanne [name], female   DOB: 60 y.o.   MRN: [id number]   HPI Patient presents stating she has a painful nail bed on her left big toe and its been hard for her to wear shoe gear comfortably and states that it is been ingrown in the corner   ROS      Objective:  Physical Exam  Neurovascular status intact with patient found to have inflammation pain of the left hallux medial border with incurvation of the bed no redness no drainage noted     Assessment:  Acute ingrown toenail deformity left hallux with pain with good digital perfusion noted     Plan:  NP reviewed condition and went ahead today discussed correction allowed her to read and signed consent form understanding risk and I infiltrated the left hallux 60 mg like Marcaine mixture sterile prep done and using sterile instrumentation I remove the border exposed matrix applied phenol 3 applications 30 seconds followed by alcohol lavage sterile dressing gave instructions for soaks and to leave dressing on 24 hours take it off earlier if throbbing were to occur and encouraged her to call questions concerns which may arise

## 2020-07-02 ENCOUNTER — Ambulatory Visit (INDEPENDENT_AMBULATORY_CARE_PROVIDER_SITE_OTHER): Payer: Medicare Other | Admitting: Family Medicine

## 2020-07-15 ENCOUNTER — Ambulatory Visit: Payer: Medicare Other | Admitting: Podiatry

## 2020-07-18 ENCOUNTER — Encounter: Payer: Self-pay | Admitting: Podiatry

## 2020-07-23 ENCOUNTER — Encounter (INDEPENDENT_AMBULATORY_CARE_PROVIDER_SITE_OTHER): Payer: Self-pay | Admitting: Family Medicine

## 2020-07-23 ENCOUNTER — Ambulatory Visit (INDEPENDENT_AMBULATORY_CARE_PROVIDER_SITE_OTHER): Payer: Medicare Other | Admitting: Family Medicine

## 2020-07-23 ENCOUNTER — Other Ambulatory Visit: Payer: Self-pay

## 2020-07-23 VITALS — BP 116/69 | HR 79 | Temp 98.1°F | Ht 69.0 in | Wt 269.0 lb

## 2020-07-23 DIAGNOSIS — E559 Vitamin D deficiency, unspecified: Secondary | ICD-10-CM

## 2020-07-23 DIAGNOSIS — E1169 Type 2 diabetes mellitus with other specified complication: Secondary | ICD-10-CM

## 2020-07-23 DIAGNOSIS — Z6841 Body Mass Index (BMI) 40.0 and over, adult: Secondary | ICD-10-CM | POA: Diagnosis not present

## 2020-07-23 DIAGNOSIS — Z794 Long term (current) use of insulin: Secondary | ICD-10-CM

## 2020-07-23 DIAGNOSIS — F3289 Other specified depressive episodes: Secondary | ICD-10-CM | POA: Diagnosis not present

## 2020-07-24 ENCOUNTER — Ambulatory Visit (INDEPENDENT_AMBULATORY_CARE_PROVIDER_SITE_OTHER): Payer: Medicare Other | Admitting: Psychology

## 2020-07-24 ENCOUNTER — Telehealth (INDEPENDENT_AMBULATORY_CARE_PROVIDER_SITE_OTHER): Payer: Medicare Other | Admitting: Psychiatry

## 2020-07-24 DIAGNOSIS — F429 Obsessive-compulsive disorder, unspecified: Secondary | ICD-10-CM

## 2020-07-24 DIAGNOSIS — F251 Schizoaffective disorder, depressive type: Secondary | ICD-10-CM

## 2020-07-24 MED ORDER — ARIPIPRAZOLE 30 MG PO TABS: 30.0000 mg | ORAL_TABLET | Freq: Every day | ORAL | 0 refills | Status: DC

## 2020-07-24 MED ORDER — QUETIAPINE FUMARATE 300 MG PO TABS: 600.0000 mg | ORAL_TABLET | Freq: Every day | ORAL | 0 refills | Status: DC

## 2020-07-24 MED ORDER — DULOXETINE HCL 30 MG PO CPEP: 90.0000 mg | ORAL_CAPSULE | Freq: Every day | ORAL | 0 refills | Status: DC

## 2020-07-24 MED ORDER — OZEMPIC (1 MG/DOSE) 4 MG/3ML ~~LOC~~ SOPN: 2.0000 mg | PEN_INJECTOR | SUBCUTANEOUS | 0 refills | Status: DC

## 2020-07-24 MED ORDER — BUPROPION HCL ER (XL) 150 MG PO TB24: 450.0000 mg | ORAL_TABLET | Freq: Every day | ORAL | 0 refills | Status: DC

## 2020-07-24 MED ORDER — VITAMIN D (ERGOCALCIFEROL) 1.25 MG (50000 UNIT) PO CAPS: ORAL_CAPSULE | ORAL | 0 refills | Status: DC

## 2020-07-24 NOTE — Progress Notes (Signed)
Virtual Visit via Telephone Note  I connected with Jeanne [name] on 07/24/20 at  8:45 AM EDT by telephone and verified that I am speaking with the correct person using two identifiers.  Location: Patient: home Provider: office   I discussed the limitations, risks, security and privacy concerns of performing an evaluation and management service by telephone and the availability of in person appointments. I also discussed with the patient that there may be a patient responsible charge related to this service. The patient expressed understanding and agreed to proceed.   History of Present Illness: Jeanne [name] notes that her depression has improved with the increased dose of Wellbutrin. She is no longer spending all day in bed. She is more active and is going back to swimming. Her sleep is good. Her energy and motivation have significantly improved.  Her mood is brighter. She has only experienced about 1 day of depression in the last 2 weeks. Her anxiety has improved. Jeanne [name]'s stress tolerance has improved and she is able to move on quickly when upset. She denies SI/HI. Jeanne [name] denies ideas of reference, paranoia and hallucinations. Her is engaging in rituals for about 1 hour a few days when she is distracted. She    Observations/Objective:  General Appearance: unable to assess  Eye Contact:  unable to assess  Speech:  Clear and Coherent and Normal Rate  Volume:  Normal  Mood:  Euthymic  Affect:  Full Range  Thought Process:  Goal Directed, Linear and Descriptions of Associations: Intact  Orientation:  Full (Time, Place, and Person)  Thought Content:  Logical  Suicidal Thoughts:  No  Homicidal Thoughts:  No  Memory:  Immediate;   Good  Judgement:  Good  Insight:  Good  Psychomotor Activity: unable to assess  Concentration:  Concentration: Good  Recall:  Good  Fund of Knowledge:  Good  Language:  Good  Akathisia:  unable to assess  Handed:  Right  AIMS (if indicated):      Assets:  Communication Skills Desire for Improvement Financial Resources/Insurance Jeanne [name] Talents/Skills Transportation Vocational/Educational  ADL's:  unable to assess  Cognition:  WNL  Sleep:         Assessment and Plan: Depression screen [facility] 2/9 07/24/2020 06/19/2020 02/26/2019  Decreased Interest 0 2 3  Down, Depressed, Hopeless 1 3 3   PHQ - 2 Score 1 5 6   Altered sleeping 0 0 0  Tired, decreased energy 0 3 3  Change in appetite 0 2 2  Feeling bad or failure about yourself  0 0 2  Trouble concentrating 0 0 0  Moving slowly or fidgety/restless 0 0 0  Suicidal thoughts 0 0 0  PHQ-9 Score 1 10 13   Difficult doing work/chores Not difficult at all Somewhat difficult Somewhat difficult    1. Schizoaffective disorder, depressive type (HCC) - ARIPiprazole (ABILIFY) 30 MG tablet; Take 1 tablet (30 mg total) by mouth daily.  Dispense: 90 tablet; Refill: 0 - buPROPion (WELLBUTRIN XL) 150 MG 24 hr tablet; Take 3 tablets (450 mg total) by mouth daily.  Dispense: 270 tablet; Refill: 0 - DULoxetine (CYMBALTA) 30 MG capsule; Take 3 capsules (90 mg total) by mouth daily.  Dispense: 270 capsule; Refill: 0 - QUEtiapine (SEROQUEL) 300 MG tablet; Take 2 tablets (600 mg total) by mouth at bedtime.  Dispense: 180 tablet; Refill: 0  2. Obsessive-compulsive disorder, unspecified type - DULoxetine (CYMBALTA) 30 MG capsule; Take 3 capsules (90 mg total) by mouth daily.  Dispense: 270 capsule;  Refill: 0   She has a PCP appointment in April 2022 and will discuss labs then  Follow Up Instructions: In 3 months or sooner if needed   I discussed the assessment and treatment plan with the patient. The patient was provided an opportunity to ask questions and all were answered. The patient agreed with the plan and demonstrated an understanding of the instructions.   The patient was advised to call back or seek an in-person evaluation if the symptoms worsen or  if the condition fails to improve as anticipated.  I provided 10 minutes of non-face-to-face time during this encounter.   Charlcie Cradle, MD

## 2020-07-27 IMAGING — MG DIGITAL SCREENING BILAT W/ TOMO W/ CAD
8 series · 8 of 24 positions shown · non-contrast
Comparison: Previous exam(s).

CLINICAL DATA: Screening.

EXAM:
DIGITAL SCREENING BILATERAL MAMMOGRAM WITH TOMO AND CAD

[R CC synth-2D]
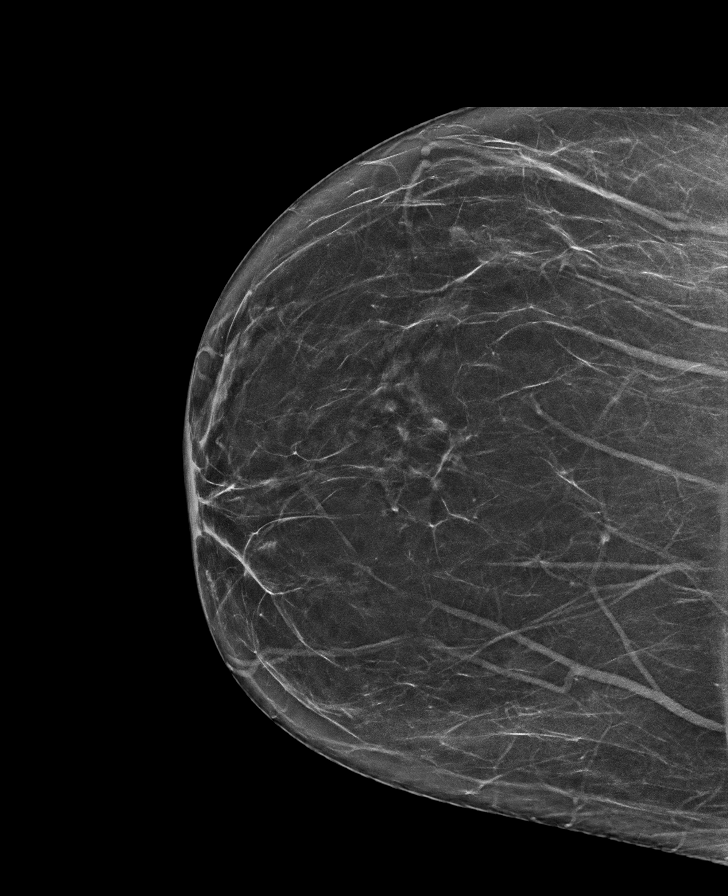

[L MLO synth-2D]
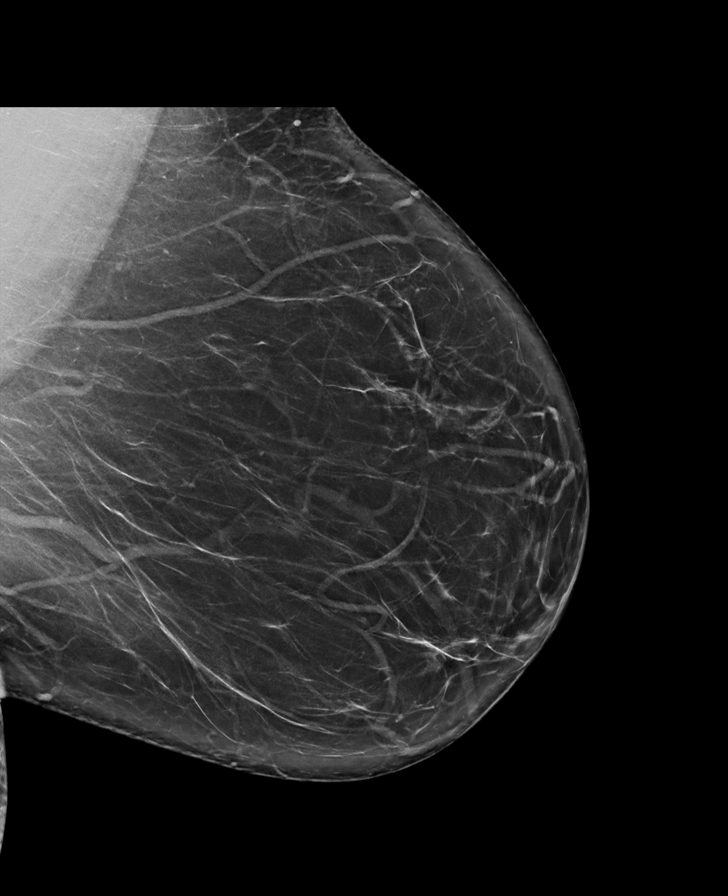

[L CC synth-2D]
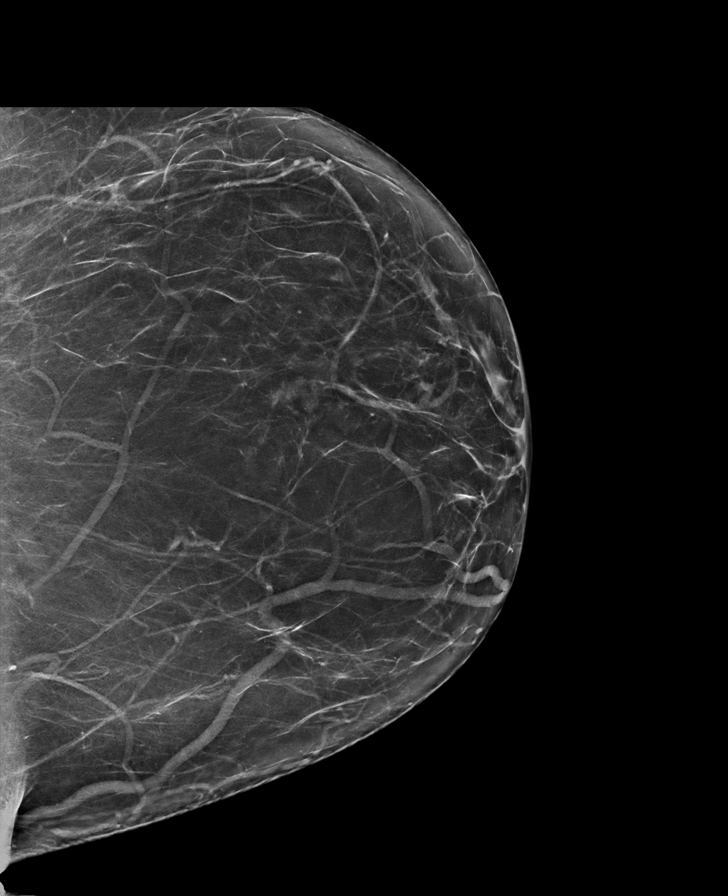

[R MLO synth-2D]
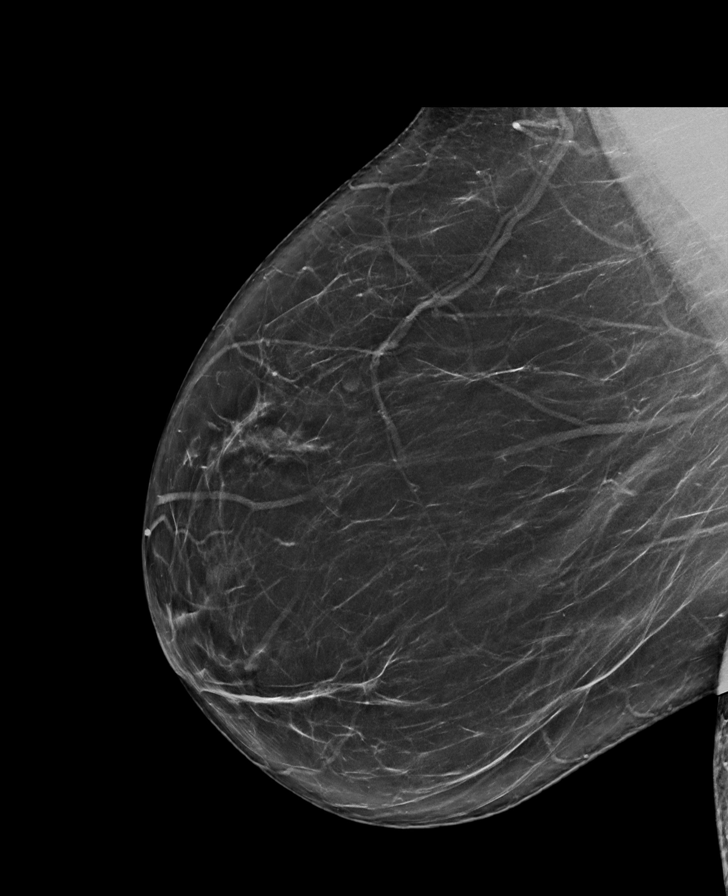

[R MLO tomo · tomo slice 42/83.0]
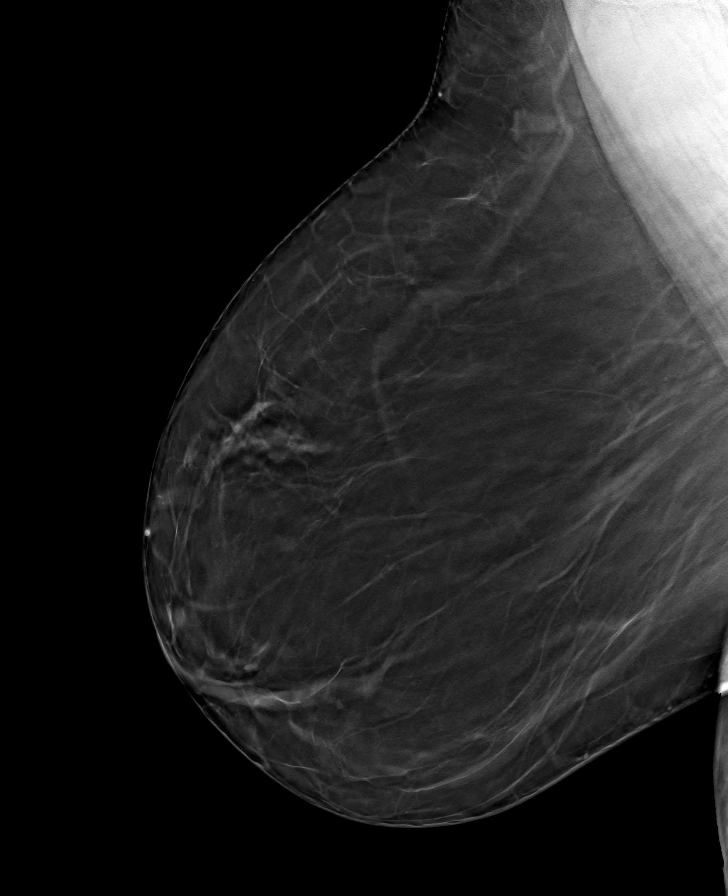

[L MLO tomo · tomo slice 45/88.0]
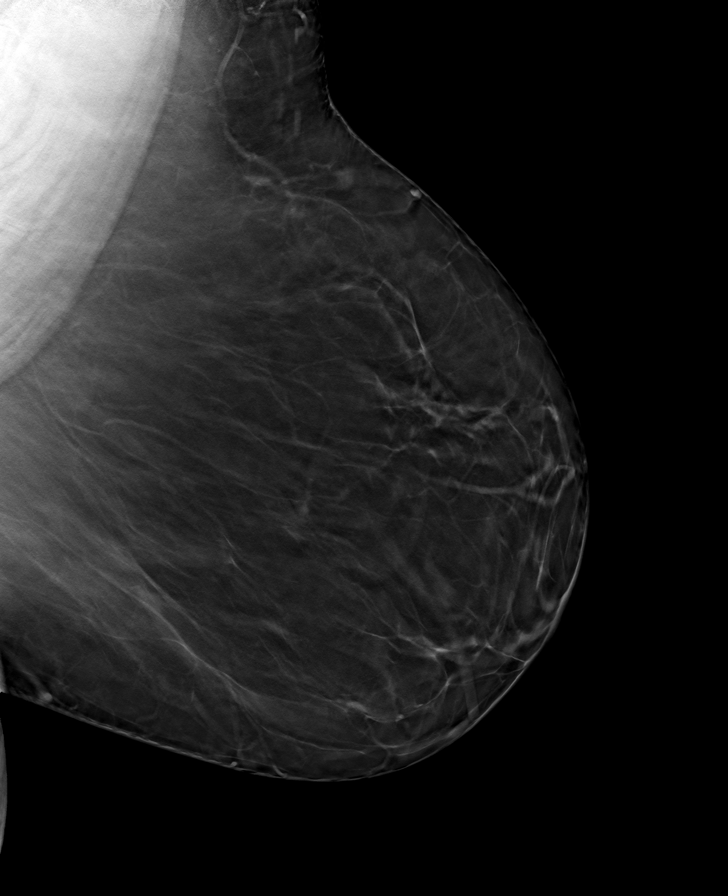

[L CC tomo · tomo slice 37/73.0]
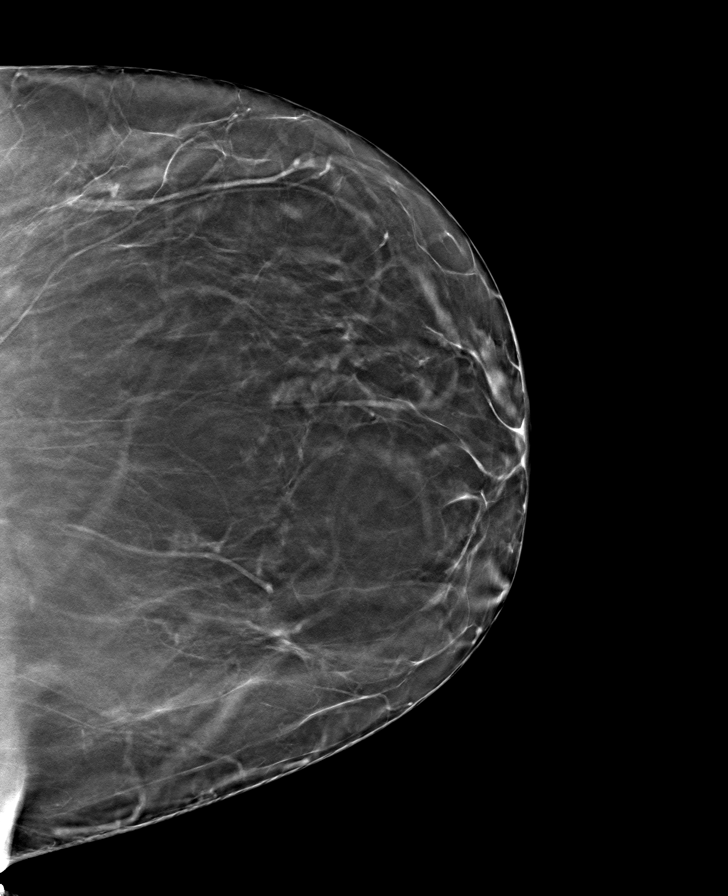

[R CC tomo · tomo slice 35/70.0]
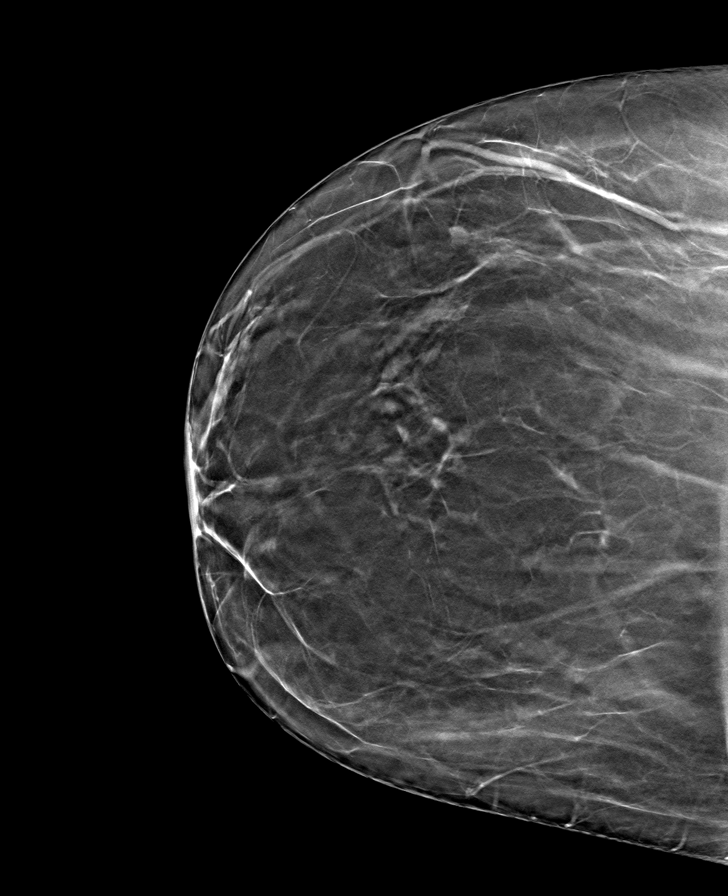

[8 of 24 positions shown; findings below may reference images not displayed]

ACR Breast Density Category b: There are scattered areas of
fibroglandular density.
FINDINGS: There are no findings suspicious for malignancy. Images were
processed with CAD.
IMPRESSION: No mammographic evidence of malignancy. A result letter of this
screening mammogram will be mailed directly to the patient.

RECOMMENDATION:
Screening mammogram in one year. (Code:[id number])

BI-RADS CATEGORY  1: Negative.

## 2020-07-29 NOTE — Progress Notes (Signed)
Chief Complaint:   OBESITY Elta is here to discuss her progress with her obesity treatment plan along with follow-up of her obesity related diagnoses.   Today's visit was #: 24 Starting weight: 281 lbs Starting date: 02/26/2019 Today's weight: 269 lbs Today's date: 07/23/2020 Total lbs lost to date: 12 lbs Body mass index is 39.72 kg/m.  Total weight loss percentage to date: -4.27%  Interim History: Janal says her Wellbutrin was increased to 450 mg last month and has not noticed a difference. She is joining the [facility] with her sister. She is being referred to Dr. Mallie Mussel.  Current Meal Plan: keeping a food journal and adhering to recommended goals of 1200 calories and 95 grams of  protein for 20% of the time.  Current Exercise Plan: Walking for 40 minutes 2 times per week.. Current Anti-Obesity Medications: Ozempic 1 mg subcutaneously daily. Side effects: None.  Assessment/Plan:   1. Vitamin D deficiency Not at goal. Current vitamin D is 29.7, tested on 01/02/2020. Optimal goal > 50 ng/dL.   Plan: Continue to take prescription Vitamin D @50 ,000 IU every week as prescribed.  Follow-up for routine testing of Vitamin D, at least 2-3 times per year to avoid over-replacement.   - Refill Vitamin D, Ergocalciferol, (DRISDOL) 1.25 MG (50000 UNIT) CAPS capsule; TAKE 1 CAPSULE BY MOUTH EVERY 7 DAYS  Dispense: 4 capsule; Refill: 0  2. Type 2 diabetes mellitus with other specified complication, with long-term current use of insulin (HCC) Diabetes Mellitus: Not at goal. Medication: Ozempic 1 mg subcutaneously weekly. Issues reviewed: blood sugar goals, complications of diabetes mellitus, hypoglycemia prevention and treatment, exercise, and nutrition.   Plan: The importance of regular follow up with PCP and all other specialists as scheduled was stressed to patient today. Increase Ozempic 1 mg subcutaneously weekly to 2 mg subcutaneously weekly.  Lab Results  Component  Value Date   HGBA1C 6.3 (H) 01/02/2020   HGBA1C 6.3 (H) 02/26/2019   Lab Results  Component Value Date   LDLCALC 90 01/02/2020   CREATININE 1.18 (H) 01/02/2020   - Start Semaglutide, 1 MG/DOSE, (OZEMPIC, 1 MG/DOSE,) 4 MG/3ML SOPN; Inject 2 mg into the skin once a week.  Dispense: 6 mL; Refill: 0  3. Other depression, with emotional eating Controlled. Medication: Wellbutrin XL 450 mg daily.  Plan:  Behavior modification techniques were discussed today to help deal with emotional/non-hunger eating behaviors. Patient was referred back to Dr. Mallie Mussel, our Bariatric Psychologist, for evaluation due to her elevated PHQ-9 score and significant struggles with emotional eating. She was also give the name for future therapist Rosalio Loud).  4. Obesity, current BMI 39.8  Course: Cherly is currently in the action stage of change. As such, her goal is to continue with weight loss efforts.   Nutrition goals: She has agreed to keeping a food journal and adhering to recommended goals of 1200 calories and 95 grams of protein.   Exercise goals: As is with increased activity.  Behavioral modification strategies: increasing lean protein intake, decreasing simple carbohydrates, increasing vegetables and increasing water intake.  Dalissa has agreed to follow-up with our clinic in 4 weeks. She was informed of the importance of frequent follow-up visits to maximize her success with intensive lifestyle modifications for her multiple health conditions.   Objective:   Blood pressure 116/69, pulse 79, temperature 98.1 F (36.7 C), temperature source Oral, height 5\' 9"  (1.753 m), weight 269 lb (122 kg), SpO2 95 %. Body mass index is 39.72 kg/m.  General: Cooperative, alert, well developed, in no acute distress. HEENT: Conjunctivae and lids unremarkable. Cardiovascular: Regular rhythm.  Lungs: Normal work of breathing. Neurologic: No focal deficits.   Lab Results  Component Value Date   CREATININE  1.18 (H) 01/02/2020   BUN 17 01/02/2020   NA 141 01/02/2020   K 4.5 01/02/2020   CL 102 01/02/2020   CO2 27 01/02/2020   Lab Results  Component Value Date   ALT 22 01/02/2020   AST 13 01/02/2020   ALKPHOS 131 (H) 01/02/2020   BILITOT 0.4 01/02/2020   Lab Results  Component Value Date   HGBA1C 6.3 (H) 01/02/2020   HGBA1C 6.3 (H) 02/26/2019   Lab Results  Component Value Date   INSULIN 21.0 02/26/2019   Lab Results  Component Value Date   TSH 0.783 01/02/2020   Lab Results  Component Value Date   CHOL 178 01/02/2020   HDL 74 01/02/2020   LDLCALC 90 01/02/2020   TRIG 78 01/02/2020   CHOLHDL 2.4 01/02/2020   Lab Results  Component Value Date   WBC 4.8 01/02/2020   HGB 14.9 01/02/2020   HCT 42.9 01/02/2020   MCV 95 01/02/2020   PLT 172 01/02/2020   Obesity Behavioral Intervention:   Approximately 15 minutes were spent on the discussion below.  ASK: We discussed the diagnosis of obesity with Eknoor today and Joan agreed to give [name] permission to discuss obesity behavioral modification therapy today.  ASSESS: Natallia has the diagnosis of obesity and her BMI today is 39.72. Mekhia is in the action stage of change.   ADVISE: Jream was educated on the multiple health risks of obesity as well as the benefit of weight loss to improve her health. She was advised of the need for long term treatment and the importance of lifestyle modifications to improve her current health and to decrease her risk of future health problems.  AGREE: Multiple dietary modification options and treatment options were discussed and Arma agreed to follow the recommendations documented in the above note.  ARRANGE: Krisa was educated on the importance of frequent visits to treat obesity as outlined per CMS and USPSTF guidelines and agreed to schedule her next follow up appointment today.  Attestation Statements:   Reviewed by clinician on day of visit: allergies,  medications, problem list, medical history, surgical history, family history, social history, and previous encounter notes.  Leodis Binet Friedenbach, CMA, am acting as Location manager for [employer], DO.  I have reviewed the above documentation for accuracy and completeness, and I agree with the above. [name], DO

## 2020-07-30 ENCOUNTER — Encounter: Payer: Self-pay | Admitting: Podiatry

## 2020-07-30 ENCOUNTER — Other Ambulatory Visit: Payer: Self-pay

## 2020-07-30 ENCOUNTER — Ambulatory Visit: Payer: Medicare Other | Admitting: Podiatry

## 2020-07-30 DIAGNOSIS — M79674 Pain in right toe(s): Secondary | ICD-10-CM

## 2020-07-30 DIAGNOSIS — L600 Ingrowing nail: Secondary | ICD-10-CM

## 2020-07-30 DIAGNOSIS — B351 Tinea unguium: Secondary | ICD-10-CM

## 2020-07-30 DIAGNOSIS — M79675 Pain in left toe(s): Secondary | ICD-10-CM | POA: Diagnosis not present

## 2020-07-30 NOTE — Patient Instructions (Signed)

## 2020-07-30 NOTE — Progress Notes (Signed)
Subjective:   Patient ID: Jeanne [name], female   DOB: 60 y.o.   MRN: [id number]   HPI Patient presents stating her left ingrown is healing beautifully and she wants her right fixed as it bothers her a lot also.  Also states all of her nails are thick and are getting more painful and she would like to have her nails trimmed   ROS      Objective:  Physical Exam  Neurovascular status intact with patient found to have incurvated right hallux medial lateral border with irritation of the beds at the corners with no redness or drainage noted.  All remaining nails are severely thickened dystrophic and painful     Assessment:  It ingrown toenail deformity right hallux with well-healed left hallux and severe mycotic nail infection 1 through 5 bilateral     Plan:  H&P reviewed condition discussed correction explained risk.  Patient wants surgery understanding this and understanding risk and today I went ahead and I infiltrated the right hallux 60 mg like Marcaine mixture patient signed consent form and I then went ahead and I did sterile prep of the toe and using sterile instrumentation remove the medial and lateral nail corners exposed matrix and applied phenol 3 applications 30 seconds followed by alcohol lavage sterile dressing.  Gave instructions on soaks applied sterile dressing and leave this dressing on 24 hours but take it off earlier if needed and debrided nailbeds 1 through 5 bilateral with no iatrogenic bleeding

## 2020-08-05 ENCOUNTER — Telehealth (INDEPENDENT_AMBULATORY_CARE_PROVIDER_SITE_OTHER): Payer: Self-pay

## 2020-08-05 NOTE — Telephone Encounter (Signed)
Ozempic Inj 4mg /87ml (use as directed: 24ml per 30 days) is denied for not meeting the quantity limit requirement. Quantities at or below your plan's limit 17ml per 30 days will continue to process at the pharmacy for you. Coverage for the requested quantity requires the following: One of the following: (1) The higher dose or quantity is supported in the dosage and administration section of the manufacturer's prescribing information. (2) The higher dose or quantity is supported by one of the following Medicare approved compendia: (A) Diamond. (B) Micromedex [place].

## 2020-08-06 ENCOUNTER — Other Ambulatory Visit: Payer: Self-pay

## 2020-08-06 ENCOUNTER — Ambulatory Visit (INDEPENDENT_AMBULATORY_CARE_PROVIDER_SITE_OTHER): Payer: Medicare Other | Admitting: Family Medicine

## 2020-08-06 ENCOUNTER — Encounter (INDEPENDENT_AMBULATORY_CARE_PROVIDER_SITE_OTHER): Payer: Self-pay | Admitting: Family Medicine

## 2020-08-06 ENCOUNTER — Ambulatory Visit: Payer: Medicare Other | Admitting: Podiatry

## 2020-08-06 VITALS — BP 122/74 | HR 82 | Temp 98.4°F | Ht 69.0 in | Wt 263.0 lb

## 2020-08-06 DIAGNOSIS — E1159 Type 2 diabetes mellitus with other circulatory complications: Secondary | ICD-10-CM | POA: Diagnosis not present

## 2020-08-06 DIAGNOSIS — Z794 Long term (current) use of insulin: Secondary | ICD-10-CM

## 2020-08-06 DIAGNOSIS — E1169 Type 2 diabetes mellitus with other specified complication: Secondary | ICD-10-CM | POA: Diagnosis not present

## 2020-08-06 DIAGNOSIS — F251 Schizoaffective disorder, depressive type: Secondary | ICD-10-CM | POA: Diagnosis not present

## 2020-08-06 DIAGNOSIS — Z6841 Body Mass Index (BMI) 40.0 and over, adult: Secondary | ICD-10-CM

## 2020-08-06 DIAGNOSIS — I152 Hypertension secondary to endocrine disorders: Secondary | ICD-10-CM

## 2020-08-06 DIAGNOSIS — F3289 Other specified depressive episodes: Secondary | ICD-10-CM

## 2020-08-06 DIAGNOSIS — E785 Hyperlipidemia, unspecified: Secondary | ICD-10-CM

## 2020-08-07 NOTE — Progress Notes (Signed)
Chief Complaint:   OBESITY Jeanne [name] is here to discuss her progress with her obesity treatment plan along with follow-up of her obesity related diagnoses.   Today's visit was #: 25 Starting weight: 281 lbs Starting date: 02/26/2019 Today's weight: 263 lbs Today's date: 08/06/2020 Total lbs lost to date: 18 lbs Body mass index is 38.84 kg/m.  Total weight loss percentage to date: -6.41%  Interim History:  Jeanne [name] is scheduled for a CPE next week.  New therapist will beTerry Bouert, [address]  Eating window:  Breakfast 9-10, dinner 4:00.  Fruit at 7.  Using MFP.  New snack: [employer] [place] - 3 pieces - 60 calories.  [employer] (meet up).  Current Meal Plan: the Category 2 Plan for 85% of the time.  Current Exercise Plan: Water aerobics/walking for 60-90 minutes 3 times per week. Current Anti-Obesity Medications: Ozempic 2 mg subcutaneously weekly.  Side effects: None.  Assessment/Plan:   1. Type 2 diabetes mellitus with other specified complication, with long-term current use of insulin (HCC) Diabetes Mellitus: Not at goal. Medication: Ozempic 2 mg subcutaneously weekly. Issues reviewed: blood sugar goals, complications of diabetes mellitus, hypoglycemia prevention and treatment, exercise, and nutrition.   Plan: She will continue to focus on protein-rich, low simple carbohydrate foods. We reviewed the importance of hydration, regular exercise for stress reduction, and restorative sleep.   Lab Results  Component Value Date   HGBA1C 6.3 (H) 01/02/2020   HGBA1C 6.3 (H) 02/26/2019   Lab Results  Component Value Date   LDLCALC 90 01/02/2020   CREATININE 1.18 (H) 01/02/2020   2. Hypertension associated with diabetes ([place]) At goal. Medications: Norvasc 5 mg daily, verapamil 180 mg daily.   Plan: Avoid buying foods that are: processed, frozen, or prepackaged to avoid excess salt. We will watch for signs of hypotension as she continues lifestyle modifications.  We will continue to monitor closely alongside her PCP and/or Specialist.   BP Readings from Last 3 Encounters:  08/06/20 122/74  07/23/20 116/69  06/23/20 107/62   Lab Results  Component Value Date   CREATININE 1.18 (H) 01/02/2020   3. Hyperlipidemia associated with type 2 diabetes mellitus ([place]) Course: Controlled. Lipid-lowering medications: None.   Plan: Dietary changes: Increase soluble fiber, decrease simple carbohydrates, decrease saturated fat. Exercise changes: Moderate to vigorous-intensity aerobic activity 150 minutes per week or as tolerated. We will continue to monitor along with PCP/specialists as it pertains to her weight loss journey.  Lab Results  Component Value Date   CHOL 178 01/02/2020   HDL 74 01/02/2020   LDLCALC 90 01/02/2020   TRIG 78 01/02/2020   CHOLHDL 2.4 01/02/2020   Lab Results  Component Value Date   ALT 22 01/02/2020   AST 13 01/02/2020   ALKPHOS 131 (H) 01/02/2020   BILITOT 0.4 01/02/2020   The 10-year ASCVD risk score Mikey Bussing DC Jr., et al., 2013) is: 19.7%   Values used to calculate the score:     Age: 60 years     Sex: Female     Is Non-Hispanic African American: Yes     Diabetic: Yes     Tobacco smoker: Yes     Systolic Blood Pressure: 427 mmHg     Is BP treated: Yes     HDL Cholesterol: 74 mg/dL     Total Cholesterol: 178 mg/dL  4. Schizoaffective disorder, depressive type ([place]) Jeanne [name] is followed by Psychiatry.  She is on Abilify and Seroquel.   Plan:  The current medical regimen is effective;  continue present plan and medications.  5. Other depression, with emotional eating Not at goal. Medication: Wellbutrin XL 450 mg daily and Cymbalta 90 mg daily.  She uses eating as a coping mechanism.   Plan:  Behavior modification techniques were discussed today to help deal with emotional/non-hunger eating behaviors. Patient was referred to Dr. Mallie Mussel, our Bariatric Psychologist, for evaluation due to her elevated PHQ-9 score and  significant struggles with emotional eating.  6. Obesity, current BMI 39  Course: Jeanne [name] is currently in the action stage of change. As such, her goal is to continue with weight loss efforts.   Nutrition goals: She has agreed to the Category 2 Plan.   Exercise goals: For substantial health benefits, adults should do at least 150 minutes (2 hours and 30 minutes) a week of moderate-intensity, or 75 minutes (1 hour and 15 minutes) a week of vigorous-intensity aerobic physical activity, or an equivalent combination of moderate- and vigorous-intensity aerobic activity. Aerobic activity should be performed in episodes of at least 10 minutes, and preferably, it should be spread throughout the week.  Behavioral modification strategies: increasing lean protein intake, decreasing simple carbohydrates, increasing vegetables and increasing water intake.  Jeanne [name] has agreed to follow-up with our clinic in 4 weeks. She was informed of the importance of frequent follow-up visits to maximize her success with intensive lifestyle modifications for her multiple health conditions.   Objective:   Blood pressure 122/74, pulse 82, temperature 98.4 F (36.9 C), temperature source Oral, height 5\' 9"  (1.753 m), weight 263 lb (119.3 kg), SpO2 98 %. Body mass index is 38.84 kg/m.  General: Cooperative, alert, well developed, in no acute distress. HEENT: Conjunctivae and lids unremarkable. Cardiovascular: Regular rhythm.  Lungs: Normal work of breathing. Neurologic: No focal deficits.   Lab Results  Component Value Date   CREATININE 1.18 (H) 01/02/2020   BUN 17 01/02/2020   NA 141 01/02/2020   K 4.5 01/02/2020   CL 102 01/02/2020   CO2 27 01/02/2020   Lab Results  Component Value Date   ALT 22 01/02/2020   AST 13 01/02/2020   ALKPHOS 131 (H) 01/02/2020   BILITOT 0.4 01/02/2020   Lab Results  Component Value Date   HGBA1C 6.3 (H) 01/02/2020   HGBA1C 6.3 (H) 02/26/2019   Lab Results  Component  Value Date   INSULIN 21.0 02/26/2019   Lab Results  Component Value Date   TSH 0.783 01/02/2020   Lab Results  Component Value Date   CHOL 178 01/02/2020   HDL 74 01/02/2020   LDLCALC 90 01/02/2020   TRIG 78 01/02/2020   CHOLHDL 2.4 01/02/2020   Lab Results  Component Value Date   WBC 4.8 01/02/2020   HGB 14.9 01/02/2020   HCT 42.9 01/02/2020   MCV 95 01/02/2020   PLT 172 01/02/2020   Obesity Behavioral Intervention:   Approximately 15 minutes were spent on the discussion below.  ASK: We discussed the diagnosis of obesity with Jeanne [name] today and Jeanne [name] agreed to give [name] permission to discuss obesity behavioral modification therapy today.  ASSESS: Jeanne [name] has the diagnosis of obesity and her BMI today is 39.0. Jeanne [name] is in the action stage of change.   ADVISE: Jeanne [name] was educated on the multiple health risks of obesity as well as the benefit of weight loss to improve her health. She was advised of the need for long term treatment and the importance of lifestyle modifications to improve her current health and  to decrease her risk of future health problems.  AGREE: Multiple dietary modification options and treatment options were discussed and Jeanne [name] agreed to follow the recommendations documented in the above note.  ARRANGE: Chriss was educated on the importance of frequent visits to treat obesity as outlined per CMS and USPSTF guidelines and agreed to schedule her next follow up appointment today.  Attestation Statements:   Reviewed by clinician on day of visit: allergies, medications, problem list, medical history, surgical history, family history, social history, and previous encounter notes.  I, Water quality scientist, CMA, am acting as transcriptionist for [name], DO  I have reviewed the above documentation for accuracy and completeness, and I agree with the above. [name], DO

## 2020-08-08 LAB — HEPATIC FUNCTION PANEL
ALT: 28 (ref 7–35)
AST: 21 (ref 13–35)
Alkaline Phosphatase: 105 (ref 25–125)
Bilirubin, Total: 0.5

## 2020-08-08 LAB — BASIC METABOLIC PANEL
BUN: 12 (ref 4–21)
CO2: 23 — AB (ref 13–22)
Chloride: 101 (ref 99–108)
Creatinine: 1.2 — AB (ref 0.5–1.1)
Glucose: 99
Potassium: 4 (ref 3.4–5.3)
Sodium: 135 — AB (ref 137–147)

## 2020-08-08 LAB — COMPREHENSIVE METABOLIC PANEL
GFR calc Af Amer: 55.4
GFR calc non Af Amer: 45.8

## 2020-08-08 LAB — CBC AND DIFFERENTIAL
HCT: 42 (ref 36–46)
Hemoglobin: 14.7 (ref 12.0–16.0)
Neutrophils Absolute: 24.7
Platelets: 198 (ref 150–399)
WBC: 4.3

## 2020-08-08 LAB — TSH: TSH: 1.13 (ref 0.41–5.90)

## 2020-08-08 LAB — LIPID PANEL
Cholesterol: 177 (ref 0–200)
HDL: 63 (ref 35–70)
LDL Cholesterol: 95
LDl/HDL Ratio: 1.5
Triglycerides: 94 (ref 40–160)

## 2020-08-08 LAB — HEMOGLOBIN A1C: Hemoglobin A1C: 5.6

## 2020-08-08 LAB — CBC: RBC: 4.4 (ref 3.87–5.11)

## 2020-08-11 ENCOUNTER — Encounter (INDEPENDENT_AMBULATORY_CARE_PROVIDER_SITE_OTHER): Payer: Self-pay | Admitting: Family Medicine

## 2020-08-14 ENCOUNTER — Telehealth (HOSPITAL_COMMUNITY): Payer: Medicare Other | Admitting: Psychiatry

## 2020-08-15 LAB — MICROALBUMIN, URINE: Microalb, Ur: 7

## 2020-08-19 ENCOUNTER — Ambulatory Visit (INDEPENDENT_AMBULATORY_CARE_PROVIDER_SITE_OTHER): Payer: Medicare Other | Admitting: Psychology

## 2020-08-19 DIAGNOSIS — F251 Schizoaffective disorder, depressive type: Secondary | ICD-10-CM | POA: Diagnosis not present

## 2020-08-21 NOTE — Progress Notes (Signed)
Office: [phone]  /  Fax: [phone]    Date: Aug 25, 2020   Appointment Start Time: 12:02pm Duration: 36 minutes Provider: Glennie Isle, Psy.D. Type of Session: Intake for Individual Therapy  Location of Patient: Home Location of Provider: Provider's Home (private office) Type of Contact: Telepsychological Visit via MyChart Video Visit  Informed Consent: Prior to proceeding with today's appointment, two pieces of identifying information were obtained. In addition, Adleigh's physical location at the time of this appointment was obtained as well a phone number she could be reached at in the event of technical difficulties. [place] and this provider participated in today's telepsychological service.   The provider's role was explained to Bayle [place]. The provider reviewed and discussed issues of confidentiality, privacy, and limits therein (e.g., reporting obligations). In addition to verbal informed consent, written informed consent for psychological services was obtained prior to the initial appointment. Since the clinic is not a 24/7 crisis center, mental health emergency resources were shared and this  provider explained MyChart, e-mail, voicemail, and/or other messaging systems should be utilized only for non-emergency reasons. This provider also explained that information obtained during appointments will be placed in Saniyyah's medical record and relevant information will be shared with other providers at [facility] for coordination of care. Rhilyn agreed information may be shared with other [facility] providers as needed for coordination of care and by signing the service agreement document, she provided written consent for coordination of care. Prior to initiating telepsychological services, Walta completed an informed consent document, which included the development of a safety plan (i.e., an emergency contact and emergency resources) in  the event of an emergency/crisis. Shaterria expressed understanding of the rationale of the safety plan. Avneet verbally acknowledged understanding she is ultimately responsible for understanding her insurance benefits for telepsychological and in-person services. This provider also reviewed confidentiality, as it relates to telepsychological services, as well as the rationale for telepsychological services (i.e., to reduce exposure risk to COVID-19). Lylia  acknowledged understanding that appointments cannot be recorded without both party consent and she is aware she is responsible for securing confidentiality on her end of the session. Adreena verbally consented to proceed.  Chief Complaint/HPI: Eulonda was referred by Dr. [name] due to other depression, with emotional eating. Per the note for the visit with Dr. Juleen China on August 06, 2020, "She uses eating as a coping mechanism." Ilse previously met with this provider to address emotional eating behaviors on March 06, 2019 and March 27, 2019.  During today's appointment, Okla stated she is coping with symptoms of depression by overeating, noting she is hoping to obtain coping skills for the aforementioned. She stated she experienced a "bad set back" in late March/early April. She recalled "getting into a funk" resulting in eating and weight gain. In addition, Reham denied engagement in binge eating behaviors and engagement in compensatory strategies. Furthermore, Zaylia denied other problems of concern. She noted an improvement in her eating habits and she is going to the pool for physical activity.   Mental Status Examination:  Appearance: well groomed and appropriate hygiene  Behavior: appropriate to circumstances Mood: euthymic Affect: mood congruent Speech: normal in rate, volume, and tone Eye Contact: appropriate Psychomotor Activity: unable to assess  Gait: unable to assess Thought Process: linear,  logical, and goal directed  Thought Content/Perception: denies suicidal and homicidal ideation, plan, and intent, no hallucinations, delusions, bizarre thinking or behavior reported or observed and denies ideation of and engagement in self-injurious  behaviors Orientation: time, person, place, and purpose of appointment Memory/Concentration: memory, attention, language, and fund of knowledge intact  Insight/Judgment: good  Family & Psychosocial History: Hanae reported she is not in a relationship. She indicated she is currently retired. 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 Currently, Gaynelle's social support system consists of her sisters. She shared she is currently a part of a writing group. Moreover, Caleen stated she resides with her nephew, noting he is "in and out."   Medical History:  Past Medical History:  Diagnosis Date  . Antiphospholipid antibody syndrome ([place])   . Antiphospholipid antibody syndrome ([place])   . Anxiety   . Arthritis   . Bilateral swelling of feet   . Carpal tunnel syndrome 01/02/2014   Bilateral  . Clotting disorder ([place])   . Constipation   . Depression   . Diabetes mellitus type 2, insulin dependent ([place]) 02/26/2019  . Diabetes mellitus, type II ([place])   . Difficult airway for intubation 03/13/2019  . Difficult intubation 07/09/2013   Glidescope used, see Anesthesia note  . DVT (deep venous thrombosis) ([place]) 2014   LEFT LEG  . Gastritis   . Glaucoma   . Heart murmur   . History of blood clots   . Hyperlipidemia   . Hypertension   . Hypokalemia 03/05/2013  . Kidney disease    stage ll  . Neuromuscular disorder ([place])    " NEUROPATHY IN  MY HANDS"  . Obsessive-compulsive disorder   . Osteoarthritis   . Rheumatoid arthritis ([place])   . Rheumatoid arthritis ([place]) 2016  . Schizo-affective psychosis ([place])   . Schizoaffective disorder ([place])   . Scleritis of both eyes   . Sleep apnea    Bipap   . Suppurative hidradenitis   . Undifferentiated connective tissue disease ([place])    Past Surgical History:  Procedure Laterality Date  . BUNIONECTOMY    . CERVICAL CONIZATION W/BX    . COLONOSCOPY WITH PROPOFOL N/A 03/13/2019   Procedure: COLONOSCOPY WITH PROPOFOL;  Surgeon: Doran Stabler, MD;  Location: [facility];  Service: Gastroenterology;  Laterality: N/A;  . LAPAROSCOPIC ADRENALECTOMY Left 07/09/2013   Procedure: LAPAROSCOPIC LEFT  ADRENALECTOMY;  Surgeon: Earnstine Regal, MD;  Location: [facility];  Service: General;  Laterality: Left;  . POLYPECTOMY  03/13/2019   Procedure: POLYPECTOMY;  Surgeon: Doran Stabler, MD;  Location: [facility];  Service: Gastroenterology;;  . sweat gland removal  1975   Current Outpatient Medications on File Prior to Visit  Medication Sig Dispense Refill  . amLODipine (NORVASC) 5 MG tablet Take 5 mg by mouth every morning.     . ARIPiprazole (ABILIFY) 30 MG tablet Take 1 tablet (30 mg total) by mouth daily. 90 tablet 0  . aspirin EC 81 MG tablet Take 162 mg by mouth daily.    [name] atorvastatin (LIPITOR) 20 MG tablet Take 1 tablet by mouth daily.    [name] buPROPion (WELLBUTRIN XL) 150 MG 24 hr tablet Take 3 tablets (450 mg total) by mouth daily. 270 tablet 0  . DULoxetine (CYMBALTA) 30 MG capsule Take 3 capsules (90 mg total) by mouth daily. 270 capsule 0  . esomeprazole (NEXIUM) 40 MG capsule Take 40 mg by mouth daily before breakfast.    . furosemide (LASIX) 40 MG tablet Take 40 mg by mouth.    [name] glucosamine-chondroitin 500-400 MG tablet Take 2 tablets by mouth every morning.    . inFLIXimab-dyyb 3 mg/kg  in sodium chloride 0.9 % Inject 3 mg/kg into the vein every 8 (eight) weeks.    . Insulin Pen Needle 32G X 4 MM MISC 1 each by Does not apply route once a week. 50 each 0  . Krill Oil 300 MG CAPS Take 300 mg by mouth daily.    [name] levobunolol (BETAGAN) 0.5 % ophthalmic solution Place 1 drop into both eyes every morning.     . Multiple Vitamin  (MULTIVITAMIN WITH MINERALS) TABS tablet Take 1 tablet by mouth daily.    [name] NIFEdipine (PROCARDIA-XL/ADALAT CC) 60 MG 24 hr tablet Take 1 tablet (60 mg total) by mouth daily. 30 tablet 1  . oxyCODONE-acetaminophen (PERCOCET/ROXICET) 5-325 MG tablet Take 1-2 tablets by mouth every 4 (four) hours as needed for severe pain.    [name] PRESCRIPTION MEDICATION Inhale 1 application into the lungs at bedtime. BiPAP setting 8/12    . QUEtiapine (SEROQUEL) 300 MG tablet Take 2 tablets (600 mg total) by mouth at bedtime. 180 tablet 0  . Semaglutide, 1 MG/DOSE, (OZEMPIC, 1 MG/DOSE,) 4 MG/3ML SOPN Inject 2 mg into the skin once a week. 6 mL 0  . Turmeric POWD Take 1,500 mcg by mouth daily at 12 noon.     . varenicline (CHANTIX) 0.5 MG tablet Take 0.5 mg by mouth daily.    . verapamil (CALAN-SR) 180 MG CR tablet Take 1 tablet (180 mg total) by mouth daily. 30 tablet 1  . Vitamin D, Ergocalciferol, (DRISDOL) 1.25 MG (50000 UNIT) CAPS capsule TAKE 1 CAPSULE BY MOUTH EVERY 7 DAYS 4 capsule 0   No current facility-administered medications on file prior to visit.   Mental Health History: Dorothea reported she continued to meet with Trey Paula with [facility] since the last appointment with this provider, noting she is retiring at the end of the month and Reesa will be continuing services with [facility]. She stated the focus of treatment has been stress management, noting "the schizoaffective flares up when I get stressed out and not able to manage it." She reported she had a "bout" several years ago, noting she is able to cope. Elisheva stated she spoke with Ms. Flores regarding the recent depressive episode. She shared they were meeting once a month for individual therapy, noting a plan to start with twice a month with [facility] Lime in June. Kimiya shared she continues to meet with Dr. Darrall Dears for medication management every six months, noting Dr. Darrall Dears prescribes all current psychotropic  medications. She denied any concerns with her current medications. Everli denied hospitalizations for psychiatric concerns; suicidal ideation, plan, and intent; hallucinations; delusions; paranoia; and trauma since the last appointment with this provider.  Kelce described her typical mood lately as "up and stable." Del denied current alcohol use. She endorsed tobacco use, noting she smokes 1/2 pack of cigarettes daily. She denied illicit/recreational substance use. Regarding caffeine intake, Charonda reported consuming coffee (10oz) daily. Furthermore, Kiandra indicated she is not experiencing the following: symptoms of mania , social withdrawal, crying spells, panic attacks, decreased motivation, symptoms of trauma and memory concerns. She also denied current suicidal ideation, plan, and intent; history of and current homicidal ideation, plan, and intent; and history of and current engagement in self-harm.  The following strengths were reported by Luna: reliable, consistent, and loyal. The following strengths were observed by this provider: ability to express thoughts and feelings during the therapeutic session, ability to establish and benefit from a therapeutic relationship, willingness to work toward established goal(s) with  the clinic and ability to engage in reciprocal conversation.   Legal History: Chai reported there is no history of legal involvement.   Structured Assessments Results: The Patient Health Questionnaire-9 (PHQ-9) is a self-report measure that assesses symptoms and severity of depression over the course of the last two weeks. Sanela obtained a score of 0. [0= Not at all; 1= Several days; 2= More than half the days; 3= Nearly every day] Little interest or pleasure in doing things 0  Feeling down, depressed, or hopeless 0  Trouble falling or staying asleep, or sleeping too much 0  Feeling tired or having little energy 0  Poor appetite or overeating 0   Feeling bad about yourself --- or that you are a failure or have let yourself or your family down 0  Trouble concentrating on things, such as reading the newspaper or watching television 0  Moving or speaking so slowly that other people could have noticed? Or the opposite --- being so fidgety or restless that you have been moving around a lot more than usual 0  Thoughts that you would be better off dead or hurting yourself in some way 0  PHQ-9 Score 0    The Generalized Anxiety Disorder-7 (GAD-7) is a brief self-report measure that assesses symptoms of anxiety over the course of the last two weeks. Almadelia obtained a score of 0. [0= Not at all; 1= Several days; 2= Over half the days; 3= Nearly every day] Feeling nervous, anxious, on edge 0  Not being able to stop or control worrying 0  Worrying too much about different things 0  Trouble relaxing 0  Being so restless that it's hard to sit still 0  Becoming easily annoyed or irritable 0  Feeling afraid as if something awful might happen 0  GAD-7 Score 0   Interventions:  Conducted a chart review Focused on rapport building Verbally administered PHQ-9 and GAD-7 for symptom monitoring Provided emphatic reflections and validation Collaborated with patient on a treatment goal  Psychoeducation provided regarding pleasurable activities  Reviewed emotional and physical hunger characteristics   Provisional DSM-5 Diagnosis(es): F50.89 Other Specified Feeding or Eating Disorder, Emotional Eating Behaviors and F25.1 Schizoaffective Disorder, Depressive Type  Plan: Kambryn appears able and willing to participate as evidenced by collaboration on a treatment goal, engagement in reciprocal conversation, and asking questions as needed for clarification. The next appointment will be scheduled in one month, which will be via MyChart Video Visit. The following treatment goal was established: increase coping skills. This provider will regularly review  the treatment plan and medical chart to keep informed of status changes. Donni expressed understanding and agreement with the initial treatment plan of care.  Reviewed emotional and physical hunger. Yee provided verbal consent during today's appointment for this provider to send the handout via e-mail. Psychoeducation regarding pleasurable activities, including its impact on emotional eating and overall well-being was provided. Tamieka was provided with a handout with various options of pleasurable activities, and was encouraged to engage in one activity a day and additional activities as needed when triggered to emotionally eat. Roxsana agreed. Karlyn provided verbal consent during today's appointment for this provider to send a handout with pleasurable activities via e-mail.   Additionally, Darlyn was receptive to signing authorizations for coordination of care with her primary therapist and psychiatrist. She was agreeable to this provider leaving authorization forms with front desk staff for her to complete during her appointment on Aug 27, 2020.

## 2020-08-25 ENCOUNTER — Telehealth (INDEPENDENT_AMBULATORY_CARE_PROVIDER_SITE_OTHER): Payer: Medicare Other | Admitting: Psychology

## 2020-08-25 DIAGNOSIS — F251 Schizoaffective disorder, depressive type: Secondary | ICD-10-CM | POA: Diagnosis not present

## 2020-08-25 DIAGNOSIS — F5089 Other specified eating disorder: Secondary | ICD-10-CM | POA: Diagnosis not present

## 2020-08-27 ENCOUNTER — Other Ambulatory Visit: Payer: Self-pay

## 2020-08-27 ENCOUNTER — Ambulatory Visit (INDEPENDENT_AMBULATORY_CARE_PROVIDER_SITE_OTHER): Payer: Medicare Other | Admitting: Family Medicine

## 2020-08-27 ENCOUNTER — Encounter (INDEPENDENT_AMBULATORY_CARE_PROVIDER_SITE_OTHER): Payer: Self-pay | Admitting: Family Medicine

## 2020-08-27 VITALS — BP 110/69 | HR 78 | Temp 98.8°F | Ht 69.0 in | Wt 251.0 lb

## 2020-08-27 DIAGNOSIS — E559 Vitamin D deficiency, unspecified: Secondary | ICD-10-CM | POA: Diagnosis not present

## 2020-08-27 DIAGNOSIS — Z6841 Body Mass Index (BMI) 40.0 and over, adult: Secondary | ICD-10-CM

## 2020-08-27 DIAGNOSIS — E8881 Metabolic syndrome: Secondary | ICD-10-CM | POA: Diagnosis not present

## 2020-08-27 DIAGNOSIS — E1122 Type 2 diabetes mellitus with diabetic chronic kidney disease: Secondary | ICD-10-CM

## 2020-08-27 DIAGNOSIS — N1831 Chronic kidney disease, stage 3a: Secondary | ICD-10-CM

## 2020-08-27 MED ORDER — VITAMIN D (ERGOCALCIFEROL) 1.25 MG (50000 UNIT) PO CAPS
ORAL_CAPSULE | ORAL | 0 refills | Status: DC
Start: 2020-08-27 — End: 2020-09-24

## 2020-09-03 NOTE — Progress Notes (Signed)
Chief Complaint:   OBESITY Jeanne [name] is here to discuss her progress with her obesity treatment plan along with follow-up of her obesity related diagnoses.   Today's visit was #: 48 Starting weight: 281 lbs Starting date: 02/26/2019 Today's weight: 251 lbs Today's date: 08/27/2020 Weight change since last visit: 12 lbs Total lbs lost to date: 30 lbs Body mass index is 37.07 kg/m.  Total weight loss percentage to date: -10.68%  Interim History:  Jeanne [name] says she has been practicing mindfulness in a Christian-based program at the [facility].  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  She had a CPE on 4/29 and her A1c was 5.9 and total cholesterol was under 200.  Current Meal Plan: the Category 2 Plan for 83% of the time.  Current Exercise Plan: Walking for 90 minutes 3 times per week. Current Anti-Obesity Medications: Ozempic 2 mg subcutaneously weekly. Side effects: None.  Assessment/Plan:   Meds ordered this encounter  Medications  . Vitamin D, Ergocalciferol, (DRISDOL) 1.25 MG (50000 UNIT) CAPS capsule    Sig: TAKE 1 CAPSULE BY MOUTH EVERY 7 DAYS    Dispense:  4 capsule    Refill:  0    1. Type 2 diabetes mellitus with stage 3a chronic kidney disease, without long-term current use of insulin (HCC) Diabetes Mellitus: Not at goal. Medication: Ozempic 2 mg subcutaneously weekly. Issues reviewed: blood sugar goals, complications of diabetes mellitus, hypoglycemia prevention and treatment, exercise, and nutrition.   Plan:  Continue Ozempic.  Will refill today, as per below.  The patient will continue to focus on protein-rich, low simple carbohydrate foods. We reviewed the importance of hydration, regular exercise for stress reduction, and restorative sleep.   Lab Results  Component Value Date   HGBA1C 6.3 (H) 01/02/2020   HGBA1C 6.3 (H) 02/26/2019   Lab Results  Component Value Date   LDLCALC 90 01/02/2020   CREATININE 1.18 (H) 01/02/2020   2. Vitamin D deficiency Not  at goal. Current vitamin D is 29.7, tested on 01/02/2020. Optimal goal > 50 ng/dL.  She is taking vitamin D 50,000 IU weekly.  Plan: Continue to take prescription Vitamin D @50 ,000 IU every week as prescribed.  Follow-up for routine testing of Vitamin D, at least 2-3 times per year to avoid over-replacement.  - Refill Vitamin D, Ergocalciferol, (DRISDOL) 1.25 MG (50000 UNIT) CAPS capsule; TAKE 1 CAPSULE BY MOUTH EVERY 7 DAYS  Dispense: 4 capsule; Refill: 0  3. Metabolic syndrome Starting goal: Lose 7-10% of starting weight. She will continue to focus on protein-rich, low simple carbohydrate foods. We reviewed the importance of hydration, regular exercise for stress reduction, and restorative sleep.  We will continue to check lab work every 3 months, with 10% weight loss, or should any other concerns arise.  4. Obesity, current BMI 37.2  Course: Jeanne [name] is currently in the action stage of change. As such, her goal is to continue with weight loss efforts.   Nutrition goals: She has agreed to the Category 2 Plan.   Exercise goals: As is.  Behavioral modification strategies: increasing lean protein intake, decreasing simple carbohydrates, increasing vegetables and increasing water intake.  Jeanne [name] has agreed to follow-up with our clinic in 4 weeks. She was informed of the importance of frequent follow-up visits to maximize her success with intensive lifestyle modifications for her multiple health conditions.   Objective:   Blood pressure 110/69, pulse 78, temperature 98.8 F (37.1 C), temperature source Oral, height 5\' 9"  (1.753 m), weight  251 lb (113.9 kg), SpO2 95 %. Body mass index is 37.07 kg/m.  General: Cooperative, alert, well developed, in no acute distress. HEENT: Conjunctivae and lids unremarkable. Cardiovascular: Regular rhythm.  Lungs: Normal work of breathing. Neurologic: No focal deficits.   Lab Results  Component Value Date   CREATININE 1.18 (H) 01/02/2020   BUN 17  01/02/2020   NA 141 01/02/2020   K 4.5 01/02/2020   CL 102 01/02/2020   CO2 27 01/02/2020   Lab Results  Component Value Date   ALT 22 01/02/2020   AST 13 01/02/2020   ALKPHOS 131 (H) 01/02/2020   BILITOT 0.4 01/02/2020   Lab Results  Component Value Date   HGBA1C 6.3 (H) 01/02/2020   HGBA1C 6.3 (H) 02/26/2019   Lab Results  Component Value Date   INSULIN 21.0 02/26/2019   Lab Results  Component Value Date   TSH 0.783 01/02/2020   Lab Results  Component Value Date   CHOL 178 01/02/2020   HDL 74 01/02/2020   LDLCALC 90 01/02/2020   TRIG 78 01/02/2020   CHOLHDL 2.4 01/02/2020   Lab Results  Component Value Date   WBC 4.8 01/02/2020   HGB 14.9 01/02/2020   HCT 42.9 01/02/2020   MCV 95 01/02/2020   PLT 172 01/02/2020   Obesity Behavioral Intervention:   Approximately 15 minutes were spent on the discussion below.  ASK: We discussed the diagnosis of obesity with Jeanne [name] today and Jeanne [name] agreed to give [name] permission to discuss obesity behavioral modification therapy today.  ASSESS: Jeanne [name] has the diagnosis of obesity and her BMI today is 37.2. Jeanne [name] is in the action stage of change.   ADVISE: Jeanne [name] was educated on the multiple health risks of obesity as well as the benefit of weight loss to improve her health. She was advised of the need for long term treatment and the importance of lifestyle modifications to improve her current health and to decrease her risk of future health problems.  AGREE: Multiple dietary modification options and treatment options were discussed and Jeanne [name] agreed to follow the recommendations documented in the above note.  ARRANGE: Jeanne [name] was educated on the importance of frequent visits to treat obesity as outlined per CMS and USPSTF guidelines and agreed to schedule her next follow up appointment today.  Attestation Statements:   Reviewed by clinician on day of visit: allergies, medications, problem list, medical  history, surgical history, family history, social history, and previous encounter notes.  I, Water quality scientist, CMA, am acting as transcriptionist for [name], DO  I have reviewed the above documentation for accuracy and completeness, and I agree with the above. [name], DO

## 2020-09-08 NOTE — Progress Notes (Signed)
Office: [phone]  /  Fax: [phone]    Date: September 22, 2020   Appointment Start Time: 2:31pm Duration: 26 minutes Provider: Glennie Isle, Psy.D. Type of Session: Individual Therapy  Location of Patient: Home Location of Provider: Provider's home (private office) Type of Contact: Telepsychological Visit via MyChart Video Visit  Session Content: Jeanne [name] is a 60 y.o. female presenting for a follow-up appointment to address the previously established treatment goal of increasing coping skills. Today's appointment was a telepsychological visit due to COVID-19. Jeanne [name] provided verbal consent for today's telepsychological appointment and she is aware she is responsible for securing confidentiality on her end of the session. Prior to proceeding with today's appointment, Jeanne [name]'s physical location at the time of this appointment was obtained as well a phone number she could be reached at in the event of technical difficulties. Jeanne [name] and this provider participated in today's telepsychological service.   This provider conducted a brief check-in. Claudene shared she continues to journal and exercise. She discussed engaging in pleasurable activities as discussed during the last visit. She shared she has not engaged in emotional eating behaviors since the last appointment with this provider. Positive reinforcement was provided.   Jeanne [name] stated a desire to discuss what she can do when she finds herself engaging in emotional eating behaviors. Thus, session focused on mindfulness. She shared she is currently attending a course that has a mindfulness component. A handout was provided to [facility] with further information regarding mindfulness, including exercises. This provider also explained the benefit of mindfulness as it relates to emotional eating. Psychoeducation regarding formal (e.g., setting aside a specific time daily to engage in an exercise) and informal (e.g., cultivating  awareness in the present moment and taking a non-judgmental approach while engaging in day-to-day tasks) mindfulness was provided. During today's appointment, Jeanne [name] was led through a mindfulness exercise involving her senses and her experience was processed. Jeanne [name] provided verbal consent during today's appointment for this provider to send a handout about mindfulness via e-mail. Overall, Jeanne [name] was receptive to today's appointment as evidenced by openness to sharing, responsiveness to feedback, and willingness to engage in mindfulness exercises to assist with coping.  Mental Status Examination:  Appearance: well groomed and appropriate hygiene  Behavior: appropriate to circumstances Mood: euthymic Affect: mood congruent Speech: normal in rate, volume, and tone Eye Contact: appropriate Psychomotor Activity: unable to assess  Gait: unable to assess Thought Process: linear, logical, and goal directed  Thought Content/Perception: no hallucinations, delusions, bizarre thinking or behavior reported or observed and no evidence or endorsement of suicidal and homicidal ideation, plan, and intent Orientation: time, person, place, and purpose of appointment Memory/Concentration: memory, attention, language, and fund of knowledge intact  Insight/Judgment: good  Interventions:  Conducted a brief chart review Provided empathic reflections and validation Reviewed content from the previous session Provided positive reinforcement Employed supportive psychotherapy interventions to facilitate reduced distress and to improve coping skills with identified stressors Psychoeducation provided regarding mindfulness Engaged patient in mindfulness exercise(s)  DSM-5 Diagnosis(es): F50.89 Other Specified Feeding or Eating Disorder, Emotional Eating Behaviors and F25.1 Schizoaffective Disorder, Depressive Type  Treatment Goal & Progress: During the initial appointment with this provider, the following  treatment goal was established: increase coping skills. Jeanne [name] has demonstrated progress in her goal as evidenced by increased awareness of hunger patterns and reduction in emotional eating. Jeanne [name] also continues to demonstrate willingness to engage in learned skill(s).  Plan: The next appointment will be scheduled in one month, which will be via MyChart Video Visit.  The next session will focus on working towards the established treatment goal. Additionally, her next appointment with her primary therapist is on September 30, 2020. Jeanne [name] agreed to sign authorizations for coordination of care when she is next at the clinic for her appointment with Dr. Juleen China, which will be on September 24, 2020.

## 2020-09-17 ENCOUNTER — Ambulatory Visit (INDEPENDENT_AMBULATORY_CARE_PROVIDER_SITE_OTHER): Payer: Medicare Other | Admitting: Psychology

## 2020-09-17 DIAGNOSIS — F331 Major depressive disorder, recurrent, moderate: Secondary | ICD-10-CM | POA: Diagnosis not present

## 2020-09-22 ENCOUNTER — Telehealth (INDEPENDENT_AMBULATORY_CARE_PROVIDER_SITE_OTHER): Payer: Medicare Other | Admitting: Psychology

## 2020-09-22 DIAGNOSIS — F5089 Other specified eating disorder: Secondary | ICD-10-CM | POA: Diagnosis not present

## 2020-09-22 DIAGNOSIS — F251 Schizoaffective disorder, depressive type: Secondary | ICD-10-CM

## 2020-09-24 ENCOUNTER — Other Ambulatory Visit: Payer: Self-pay

## 2020-09-24 ENCOUNTER — Ambulatory Visit (INDEPENDENT_AMBULATORY_CARE_PROVIDER_SITE_OTHER): Payer: Medicare Other | Admitting: Family Medicine

## 2020-09-24 ENCOUNTER — Encounter (INDEPENDENT_AMBULATORY_CARE_PROVIDER_SITE_OTHER): Payer: Self-pay | Admitting: Family Medicine

## 2020-09-24 VITALS — BP 126/76 | HR 86 | Temp 98.2°F | Ht 69.0 in | Wt 240.0 lb

## 2020-09-24 DIAGNOSIS — F3289 Other specified depressive episodes: Secondary | ICD-10-CM

## 2020-09-24 DIAGNOSIS — E8881 Metabolic syndrome: Secondary | ICD-10-CM

## 2020-09-24 DIAGNOSIS — E559 Vitamin D deficiency, unspecified: Secondary | ICD-10-CM | POA: Diagnosis not present

## 2020-09-24 DIAGNOSIS — Z6841 Body Mass Index (BMI) 40.0 and over, adult: Secondary | ICD-10-CM | POA: Diagnosis not present

## 2020-09-24 MED ORDER — VITAMIN D (ERGOCALCIFEROL) 1.25 MG (50000 UNIT) PO CAPS: ORAL_CAPSULE | ORAL | 0 refills | Status: DC

## 2020-09-24 NOTE — Progress Notes (Signed)
Chief Complaint:   OBESITY Jeanne [name] is here to discuss her progress with her obesity treatment plan along with follow-up of her obesity related diagnoses. See Medical Weight Management Flowsheet for bioelectrical impedance results.  Today's visit was #: 55 Starting weight: 281 lbs Starting date: 02/26/2019 Today's weight: 240 lbs Today's date: 09/24/2020 Weight change since last visit: 11 lbs Total lbs lost to date: 41 lbs Body mass index is 35.44 kg/m.  Total weight loss percentage to date: -14.59%  Interim History: Jeanne [name] says that [employer] started this week.  She has been doing water aerobics.  She says she has been in a good mood and is working with Dr. Mallie Mussel on mindfulness.  She will have labs drawn in April at her PCP's office.  She will send them to our office.  Nutrition Plan: the Category 2 Plan for 75% of the time. Activity:  Water aerobics for 90 minutes 5 times per week. Anti-obesity medications: Ozempic 2 mg subcutaneously weekly. Reported side effects: None.  Assessment/Plan:   1. Vitamin D deficiency Not at goal. Current vitamin D is 29.7, tested on 01/02/2020. Optimal goal > 50 ng/dL.  She is taking vitamin D 50,000 IU weekly.   Plan: Continue to take prescription Vitamin D @50 ,000 IU every week as prescribed.  Follow-up for routine testing of Vitamin D, at least 2-3 times per year to avoid over-replacement.  - Refill Vitamin D, Ergocalciferol, (DRISDOL) 1.25 MG (50000 UNIT) CAPS capsule; TAKE 1 CAPSULE BY MOUTH EVERY 7 DAYS  Dispense: 4 capsule; Refill: 0  2. Metabolic syndrome Starting goal: Lose 7-10% of starting weight. She will continue to focus on protein-rich, low simple carbohydrate foods. We reviewed the importance of hydration, regular exercise for stress reduction, and restorative sleep.  We will continue to check lab work every 3 months, with 10% weight loss, or should any other concerns arise.  3. Other depression, with emotional  eating Improving, but not optimized. Medication: Wellbutrin XL 450 mg daily, Cymbalta 90 mg daily.   Plan:  Jeanne [name] says that she has been working with Dr. Mallie Mussel on mindfulness.  Her mood has been good.    4. Obesity, current BMI 35.6  Course: Jeanne [name] is currently in the action stage of change. As such, her goal is to continue with weight loss efforts.   Nutrition goals: She has agreed to the Category 2 Plan.   Exercise goals: For substantial health benefits, adults should do at least 150 minutes (2 hours and 30 minutes) a week of moderate-intensity, or 75 minutes (1 hour and 15 minutes) a week of vigorous-intensity aerobic physical activity, or an equivalent combination of moderate- and vigorous-intensity aerobic activity. Aerobic activity should be performed in episodes of at least 10 minutes, and preferably, it should be spread throughout the week.  Behavioral modification strategies: increasing lean protein intake, decreasing simple carbohydrates, increasing vegetables, increasing water intake, and decreasing liquid calories.  Jeanne [name] has agreed to follow-up with our clinic in 4 weeks. She was informed of the importance of frequent follow-up visits to maximize her success with intensive lifestyle modifications for her multiple health conditions.   Objective:   Blood pressure 126/76, pulse 86, temperature 98.2 F (36.8 C), temperature source Oral, height 5\' 9"  (1.753 m), weight 240 lb (108.9 kg), SpO2 96 %. Body mass index is 35.44 kg/m.  General: Cooperative, alert, well developed, in no acute distress. HEENT: Conjunctivae and lids unremarkable. Cardiovascular: Regular rhythm.  Lungs: Normal work of breathing. Neurologic: No focal deficits.  Lab Results  Component Value Date   CREATININE 1.18 (H) 01/02/2020   BUN 17 01/02/2020   NA 141 01/02/2020   K 4.5 01/02/2020   CL 102 01/02/2020   CO2 27 01/02/2020   Lab Results  Component Value Date   ALT 22 01/02/2020    AST 13 01/02/2020   ALKPHOS 131 (H) 01/02/2020   BILITOT 0.4 01/02/2020   Lab Results  Component Value Date   HGBA1C 6.3 (H) 01/02/2020   HGBA1C 6.3 (H) 02/26/2019   Lab Results  Component Value Date   INSULIN 21.0 02/26/2019   Lab Results  Component Value Date   TSH 0.783 01/02/2020   Lab Results  Component Value Date   CHOL 178 01/02/2020   HDL 74 01/02/2020   LDLCALC 90 01/02/2020   TRIG 78 01/02/2020   CHOLHDL 2.4 01/02/2020   Lab Results  Component Value Date   WBC 4.8 01/02/2020   HGB 14.9 01/02/2020   HCT 42.9 01/02/2020   MCV 95 01/02/2020   PLT 172 01/02/2020   Obesity Behavioral Intervention:   Approximately 15 minutes were spent on the discussion below.  ASK: We discussed the diagnosis of obesity with Stasia today and Hetty agreed to give [name] permission to discuss obesity behavioral modification therapy today.  ASSESS: Zarah has the diagnosis of obesity and her BMI today is 35.6. Mane is in the action stage of change.   ADVISE: Yailine was educated on the multiple health risks of obesity as well as the benefit of weight loss to improve her health. She was advised of the need for long term treatment and the importance of lifestyle modifications to improve her current health and to decrease her risk of future health problems.  AGREE: Multiple dietary modification options and treatment options were discussed and Johnell agreed to follow the recommendations documented in the above note.  ARRANGE: Juanette was educated on the importance of frequent visits to treat obesity as outlined per CMS and USPSTF guidelines and agreed to schedule her next follow up appointment today.  Attestation Statements:   Reviewed by clinician on day of visit: allergies, medications, problem list, medical history, surgical history, family history, social history, and previous encounter notes.  I, Water quality scientist, CMA, am acting as transcriptionist for [name], DO  I have reviewed the above documentation for accuracy and completeness, and I agree with the above. [name], DO

## 2020-09-30 ENCOUNTER — Ambulatory Visit (INDEPENDENT_AMBULATORY_CARE_PROVIDER_SITE_OTHER): Payer: Medicare Other | Admitting: Psychology

## 2020-09-30 DIAGNOSIS — F331 Major depressive disorder, recurrent, moderate: Secondary | ICD-10-CM

## 2020-10-07 NOTE — Progress Notes (Signed)
Office: [phone]  /  Fax: [phone]    Date: October 21, 2020   Appointment Start Time: 8:01am Duration: 27 minutes Provider: Glennie Isle, Psy.D. Type of Session: Individual Therapy  Location of Patient: Home Location of Provider: Provider's home (private office) Type of Contact: Telepsychological Visit via MyChart Video Visit  Session Content: Jeanne [name] is a 60 y.o. female presenting for a follow-up appointment to address the previously established treatment goal of increasing coping skills. Today's appointment was a telepsychological visit due to COVID-19. Jeanne [name] provided verbal consent for today's telepsychological appointment and she is aware she is responsible for securing confidentiality on her end of the session. Prior to proceeding with today's appointment, Jeanne [name] physical location at the time of this appointment was obtained as well a phone number she could be reached at in the event of technical difficulties. Jeanne [name] and this provider participated in today's telepsychological service.   This provider conducted a brief check-in. Jeanne [name] discussed challenges with the holiday weekend, noting, "I ate when I wasn't hungry." Associated thoughts and feelings were processed. Jeanne [name] acknowledged previously deviations from her eating habits have resulted in a desire to give up. Thus, she was engaged in thought challenging and reframing. Jeanne [name] reflected on progress to date aside from the number on the scale (e.g., ability to tie shoelaces) and was encouraged to write down the aforementioned for future reference. She was observed writing.    Additionally, Jeanne [name] continues to engage in physical activity. Positive reinforcement was provided. Session also focused further on mindfulness to assist with coping. Jeanne [name] reported utilizing a mindful approach when eating (e.g., eating at table vs. counter) and when exercising to avoid pain secondary to herniated discs. She also  discussed intentionally engaging in pleasurable activities when realizing she was experiencing emotional hunger. Psychoeducation regarding formal (e.g., setting aside a specific time daily to engage in an exercise) and informal (e.g., cultivating awareness in the present moment and taking a non-judgmental approach while engaging in day-to-day tasks) mindfulness was provided. She was encouraged to continue engaging in mindfulness exercises; she agreed. Overall, Jeanne [name] was receptive to today's appointment as evidenced by openness to sharing, responsiveness to feedback, and willingness to continue engaging in mindfulness exercises.  Mental Status Examination:  Appearance: well groomed and appropriate hygiene  Behavior: appropriate to circumstances Mood: euthymic Affect: mood congruent Speech: normal in rate, volume, and tone Eye Contact: appropriate Psychomotor Activity: unable to assess Gait: unable to assess Thought Process: linear, logical, and goal directed  Thought Content/Perception: no hallucinations, delusions, bizarre thinking or behavior reported or observed and no evidence or endorsement of suicidal and homicidal ideation, plan, and intent Orientation: time, person, place, and purpose of appointment Memory/Concentration: memory, attention, language, and fund of knowledge intact  Insight/Judgment: good  Interventions:  Conducted a brief chart review Provided empathic reflections and validation Reviewed content from the previous session Processed thoughts and feelings Psychoeducation provided regarding mindfulness Engaged patient in thought challenging/cognitive restructuring  Employed supportive psychotherapy interventions to facilitate reduced distress, and to improve coping skills with identified stressors  DSM-5 Diagnosis(es):  F50.89 Other Specified Feeding or Eating Disorder, Emotional Eating Behaviors and F25.1 Schizoaffective Disorder, Depressive Type  Treatment Goal &  Progress: During the initial appointment with this provider, the following treatment goal was established: increase coping skills. Jeanne [name] has demonstrated progress in her goal as evidenced by increased awareness of hunger patterns and reduction in emotional eating behaviors . Jeanne [name] also continues to demonstrate willingness to engage in learned skill(s).  Plan: The next appointment will  be scheduled in one month, which will be via MyChart Video Visit. The next session will focus on working towards the established treatment goal and termination planning. Additionally, Jeanne [name] continues to meet with her primary therapist every two weeks.

## 2020-10-14 ENCOUNTER — Encounter (INDEPENDENT_AMBULATORY_CARE_PROVIDER_SITE_OTHER): Payer: Self-pay | Admitting: Family Medicine

## 2020-10-14 NOTE — Progress Notes (Signed)
[facility] Progress Report   Patient Details  Name: Jeanne [name] MRN: [id number] Date of Birth: June 17, 1960 Age: 60 y.o. PCP: Velna Hatchet, MD  Vitals:   10/14/20 1506  BP: 108/70  Pulse: 63  SpO2: 98%  Weight: 244 lb 6.4 oz (110.9 kg)  Height: 5\' 9"  (1.753 m)      [facility] Eval - 10/14/20 1500       Referral    Referring Provider Self Dr Ardeth Perfect    Reason for referral Hypertension;Obesitity/Overweight;Orthopedic;Diabetes    Program Start Date 10/14/20      Measurement   Waist Circumference 48 inches    Hip Circumference 50 inches    Body fat 43.3 percent      Information for Trainer   Goals Lose additional wt 40 lbs, get stronger    Current Exercise Pool 5x a wk 1.5 hrs each    Orthopedic Concerns knees (bone on bone), hips, back disc  and neck (discs)    Pertinent Medical History DM, HTN, Obesity, increase chole    Current Barriers none    Restrictions/Precautions Fall risk;Diabetic snack before exercise;Assistive device   last fall in Feb   Medications that affect exercise Beta blocker;Medication causing dizziness/drowsiness      Timed Up and Go (TUGS)   Timed Up and Go Moderate risk 10-12 seconds      Mobility and Daily Activities   I find it easy to walk up or down two or more flights of stairs. 1    I have no trouble taking out the trash. 4    I do housework such as vacuuming and dusting on my own without difficulty. 3    I can easily lift a gallon of milk (8lbs). 4    I can easily walk a mile. 4    I have no trouble reaching into high cupboards or reaching down to pick up something from the floor. 1    I do not have trouble doing out-door work such as Armed forces logistics/support/administrative officer, raking leaves, or gardening. 3      Mobility and Daily Activities   I feel younger than my age. 1    I feel independent. 3    I feel energetic. 3    I live an active life.  3    I feel strong. 1    I feel healthy. 1    I feel active as other people my age. 2      How fit and  strong are you.   Fit and Strong Total Score 34            Past Medical History:  Diagnosis Date   Antiphospholipid antibody syndrome (HCC)    Antiphospholipid antibody syndrome (HCC)    Anxiety    Arthritis    Bilateral swelling of feet    Carpal tunnel syndrome 01/02/2014   Bilateral   Clotting disorder ([place])    Constipation    Depression    Diabetes mellitus type 2, insulin dependent ([place]) 02/26/2019   Diabetes mellitus, type II ([place])    Difficult airway for intubation 03/13/2019   Difficult intubation 07/09/2013   Glidescope used, see Anesthesia note   DVT (deep venous thrombosis) ([place]) 2014   LEFT LEG   Gastritis    Glaucoma    Heart murmur    History of blood clots    Hyperlipidemia    Hypertension    Hypokalemia 03/05/2013   Kidney disease    stage ll  Neuromuscular disorder ([place])    " NEUROPATHY IN  MY HANDS"   Obsessive-compulsive disorder    Osteoarthritis    Rheumatoid arthritis ([place])    Rheumatoid arthritis ([place]) 2016   Schizo-affective psychosis ([place])    Schizoaffective disorder (HCC)    Scleritis of both eyes    Sleep apnea    Bipap   Suppurative hidradenitis    Undifferentiated connective tissue disease ([place])    Past Surgical History:  Procedure Laterality Date   BUNIONECTOMY     CERVICAL CONIZATION W/BX     COLONOSCOPY WITH PROPOFOL N/A 03/13/2019   Procedure: COLONOSCOPY WITH PROPOFOL;  Surgeon: Doran Stabler, MD;  Location: [facility];  Service: Gastroenterology;  Laterality: N/A;   LAPAROSCOPIC ADRENALECTOMY Left 07/09/2013   Procedure: LAPAROSCOPIC LEFT  ADRENALECTOMY;  Surgeon: Earnstine Regal, MD;  Location: [facility];  Service: General;  Laterality: Left;   POLYPECTOMY  03/13/2019   Procedure: POLYPECTOMY;  Surgeon: Doran Stabler, MD;  Location: [facility];  Service: Gastroenterology;;   sweat gland removal  1975   Social History   Tobacco Use  Smoking Status Every Day   Packs/day: 0.50   Years: 25.00   Pack years:  12.50   Types: Cigarettes  Smokeless Tobacco Never     [facility] Weekly Session   Patient Details  Name: Jeanne [name] MRN: [id number] Date of Birth: 1960-05-28 Age: 60 y.o. PCP: Velna Hatchet, MD  Vitals:   10/14/20 1506  BP: 108/70  Pulse: 63  SpO2: 98%  Weight: 244 lb 6.4 oz (110.9 kg)  Height: 5\' 9"  (1.753 m)     [employer] Weekly seesion - 10/14/20 1500       Weekly Session   Topic Discussed Importance of resistance training;Other ways to be active    Minutes exercised this week 15 minutes    Classes attended to date 1             Class held at [facility] 10/14/2020, 3:11 PM

## 2020-10-14 NOTE — Telephone Encounter (Signed)
Last OV with Dr Wallace 

## 2020-10-16 ENCOUNTER — Other Ambulatory Visit: Payer: Self-pay

## 2020-10-16 ENCOUNTER — Telehealth (INDEPENDENT_AMBULATORY_CARE_PROVIDER_SITE_OTHER): Payer: Medicare Other | Admitting: Psychiatry

## 2020-10-16 DIAGNOSIS — F429 Obsessive-compulsive disorder, unspecified: Secondary | ICD-10-CM

## 2020-10-16 DIAGNOSIS — F251 Schizoaffective disorder, depressive type: Secondary | ICD-10-CM

## 2020-10-16 MED ORDER — QUETIAPINE FUMARATE 300 MG PO TABS: 600.0000 mg | ORAL_TABLET | Freq: Every day | ORAL | 0 refills | Status: DC

## 2020-10-16 MED ORDER — ARIPIPRAZOLE 30 MG PO TABS: 30.0000 mg | ORAL_TABLET | Freq: Every day | ORAL | 0 refills | Status: DC

## 2020-10-16 MED ORDER — BUPROPION HCL ER (XL) 150 MG PO TB24: 450.0000 mg | ORAL_TABLET | Freq: Every day | ORAL | 0 refills | Status: DC

## 2020-10-16 MED ORDER — DULOXETINE HCL 30 MG PO CPEP: 90.0000 mg | ORAL_CAPSULE | Freq: Every day | ORAL | 0 refills | Status: DC

## 2020-10-16 NOTE — Progress Notes (Signed)
Virtual Visit via Video Note  I connected with Jeanne [name] on 10/16/20 at  9:00 AM EDT by a video enabled telemedicine application and verified that I am speaking with the correct person using two identifiers.  Location: Patient: home Provider: office   I discussed the limitations of evaluation and management by telemedicine and the availability of in person appointments. The patient expressed understanding and agreed to proceed.  History of Present Illness: "Pretty good". The increase in Lithium has helped. She has no depressive days in at least 1 month. She remains social and active. Jeanne [name] denies anhedonia and isolation. She is sleeping and eating well. Jeanne [name] has lost 17 lbs since April thru diet and exercise. She is currently 240 lbs. She is working with a weight loss clinic thru [facility]. She denies SI/HI. She denies paranoia, ideas of reference, AVH. Her health is stable. She denies any OCD rituals or behaviors.    Observations/Objective: Psychiatric Specialty Exam: ROS  There were no vitals taken for this visit.There is no height or weight on file to calculate BMI.  General Appearance: Fairly Groomed and Neat  Eye Contact:  Good  Speech:  Clear and Coherent and Normal Rate  Volume:  Normal  Mood:  Euthymic  Affect:  Restricted  Thought Process:  Goal Directed, Linear, and Descriptions of Associations: Intact  Orientation:  Full (Time, Place, and Person)  Thought Content:  Logical  Suicidal Thoughts:  No  Homicidal Thoughts:  No  Memory:  Immediate;   Good  Judgement:  Good  Insight:  Good  Psychomotor Activity:  Normal  Concentration:  Concentration: Good  Recall:  Good  Fund of Knowledge:  Good  Language:  Good  Akathisia:  No  Handed:  Right  AIMS (if indicated):     Assets:  Communication Skills Desire for Improvement Financial Resources/Insurance Housing Resilience Social Support Talents/Skills Transportation Vocational/Educational  ADL's:  Intact   Cognition:  WNL  Sleep:        Assessment and Plan: Depression screen [facility] 2/9 10/16/2020 07/24/2020 06/19/2020 02/26/2019  Decreased Interest 0 0 2 3  Down, Depressed, Hopeless 0 1 3 3   PHQ - 2 Score 0 1 5 6   Altered sleeping - 0 0 0  Tired, decreased energy - 0 3 3  Change in appetite - 0 2 2  Feeling bad or failure about yourself  - 0 0 2  Trouble concentrating - 0 0 0  Moving slowly or fidgety/restless - 0 0 0  Suicidal thoughts - 0 0 0  PHQ-9 Score - 1 10 13   Difficult doing work/chores - Not difficult at all Somewhat difficult Somewhat difficult    Flowsheet Row Video Visit from 10/16/2020 in [facility] Video Visit from 07/24/2020 in [facility] Video Visit from 06/19/2020 in [place] No Risk No Risk No Risk       Jeanne [name] will bring a copy of her labs to the office. She had labs done in April 2022.  1. Schizoaffective disorder, depressive type ([place]) - ARIPiprazole (ABILIFY) 30 MG tablet; Take 1 tablet (30 mg total) by mouth daily.  Dispense: 90 tablet; Refill: 0 - buPROPion (WELLBUTRIN XL) 150 MG 24 hr tablet; Take 3 tablets (450 mg total) by mouth daily.  Dispense: 270 tablet; Refill: 0 - DULoxetine (CYMBALTA) 30 MG capsule; Take 3 capsules (90 mg total) by mouth daily.  Dispense: 270 capsule; Refill: 0 - QUEtiapine (SEROQUEL) 300 MG  tablet; Take 2 tablets (600 mg total) by mouth at bedtime.  Dispense: 180 tablet; Refill: 0  2. Obsessive-compulsive disorder, unspecified type - DULoxetine (CYMBALTA) 30 MG capsule; Take 3 capsules (90 mg total) by mouth daily.  Dispense: 270 capsule; Refill: 0     Follow Up Instructions: In 3 months or sooner if needed   I discussed the assessment and treatment plan with the patient. The patient was provided an opportunity to ask questions and all were answered. The patient agreed with the plan and  demonstrated an understanding of the instructions.   The patient was advised to call back or seek an in-person evaluation if the symptoms worsen or if the condition fails to improve as anticipated.  I provided 9 minutes of non-face-to-face time during this encounter.   Charlcie Cradle, MD

## 2020-10-21 ENCOUNTER — Other Ambulatory Visit (INDEPENDENT_AMBULATORY_CARE_PROVIDER_SITE_OTHER): Payer: Self-pay | Admitting: Family Medicine

## 2020-10-21 ENCOUNTER — Telehealth (INDEPENDENT_AMBULATORY_CARE_PROVIDER_SITE_OTHER): Payer: Medicare Other | Admitting: Psychology

## 2020-10-21 DIAGNOSIS — E1169 Type 2 diabetes mellitus with other specified complication: Secondary | ICD-10-CM

## 2020-10-21 DIAGNOSIS — Z794 Long term (current) use of insulin: Secondary | ICD-10-CM

## 2020-10-21 DIAGNOSIS — F5089 Other specified eating disorder: Secondary | ICD-10-CM

## 2020-10-21 DIAGNOSIS — F251 Schizoaffective disorder, depressive type: Secondary | ICD-10-CM | POA: Diagnosis not present

## 2020-10-21 DIAGNOSIS — E559 Vitamin D deficiency, unspecified: Secondary | ICD-10-CM

## 2020-10-21 NOTE — Telephone Encounter (Signed)
Last OV with Dr Wallace 

## 2020-10-22 ENCOUNTER — Ambulatory Visit: Payer: Medicare Other | Admitting: Psychology

## 2020-10-23 ENCOUNTER — Encounter (INDEPENDENT_AMBULATORY_CARE_PROVIDER_SITE_OTHER): Payer: Self-pay | Admitting: Family Medicine

## 2020-10-23 ENCOUNTER — Other Ambulatory Visit: Payer: Self-pay

## 2020-10-23 ENCOUNTER — Ambulatory Visit (INDEPENDENT_AMBULATORY_CARE_PROVIDER_SITE_OTHER): Payer: Medicare Other | Admitting: Family Medicine

## 2020-10-23 VITALS — BP 115/73 | HR 72 | Temp 98.5°F | Ht 69.0 in | Wt 240.0 lb

## 2020-10-23 DIAGNOSIS — Z6841 Body Mass Index (BMI) 40.0 and over, adult: Secondary | ICD-10-CM

## 2020-10-23 DIAGNOSIS — E8881 Metabolic syndrome: Secondary | ICD-10-CM

## 2020-10-23 DIAGNOSIS — E559 Vitamin D deficiency, unspecified: Secondary | ICD-10-CM

## 2020-10-23 MED ORDER — OZEMPIC (1 MG/DOSE) 4 MG/3ML ~~LOC~~ SOPN: 2.0000 mg | PEN_INJECTOR | SUBCUTANEOUS | 0 refills | Status: DC

## 2020-10-23 MED ORDER — VITAMIN D (ERGOCALCIFEROL) 1.25 MG (50000 UNIT) PO CAPS: ORAL_CAPSULE | ORAL | 0 refills | Status: DC

## 2020-10-27 MED ORDER — VITAMIN D (ERGOCALCIFEROL) 1.25 MG (50000 UNIT) PO CAPS: ORAL_CAPSULE | ORAL | 0 refills | Status: DC

## 2020-10-27 NOTE — Progress Notes (Signed)
Chief Complaint:   OBESITY Jeanne [name] is here to discuss her progress with her obesity treatment plan along with follow-up of her obesity related diagnoses. See Medical Weight Management Flowsheet for complete bioelectrical impedance results.  Today's visit was #: 28 Starting weight: 281 lbs Starting date: 02/26/2019 Today's weight: 240 lbs Today's date: 10/23/2020 Weight change since last visit: 0 Total lbs lost to date: 41 lbs Body mass index is 35.44 kg/m.  Total weight loss percentage to date: -14.59%  Interim History:  [name]'s waist went from 52 inches to 47 inches.  She was over-exercising and took 1 week of rest. Nutrition Plan: the Category 2 Plan for 50% of the time. Activity:  Water aerobics for 400 minutes per week. Anti-obesity medications: Ozempic 2 mg subcutaneously weekly. Reported side effects: None.  Assessment/Plan:   1. Vitamin D deficiency Not at goal.  She is taking vitamin D 50,000 IU weekly.  Plan: Continue to take prescription Vitamin D @50 ,000 IU every week as prescribed.  Follow-up for routine testing of Vitamin D, at least 2-3 times per year to avoid over-replacement.  Lab Results  Component Value Date   VD25OH 29.7 (L) 01/02/2020   VD25OH 24.2 (L) 02/26/2019   - Refill Vitamin D, Ergocalciferol, (DRISDOL) 1.25 MG (50000 UNIT) CAPS capsule; TAKE 1 CAPSULE BY MOUTH EVERY 7 DAYS  Dispense: 4 capsule; Refill: 0  2. Metabolic syndrome Starting goal: Lose 7-10% of starting weight. She will continue to focus on protein-rich, low simple carbohydrate foods. We reviewed the importance of hydration, regular exercise for stress reduction, and restorative sleep.  We will continue to check lab work every 3 months, with 10% weight loss, or should any other concerns arise.  3. Obesity, current BMI 35.5  Course: Jeanne [name] is currently in the action stage of change. As such, her goal is to continue with weight loss efforts.   Nutrition goals: She has agreed  to the Category 2 Plan.   Exercise goals:  As is.  Behavioral modification strategies: increasing lean protein intake, decreasing simple carbohydrates, increasing vegetables, and increasing water intake.  Jeanne [name] has agreed to follow-up with our clinic in 4 weeks. She was informed of the importance of frequent follow-up visits to maximize her success with intensive lifestyle modifications for her multiple health conditions.   Objective:   Blood pressure 115/73, pulse 72, temperature 98.5 F (36.9 C), temperature source Oral, height 5\' 9"  (1.753 m), weight 240 lb (108.9 kg), SpO2 97 %. Body mass index is 35.44 kg/m.  General: Cooperative, alert, well developed, in no acute distress. HEENT: Conjunctivae and lids unremarkable. Cardiovascular: Regular rhythm.  Lungs: Normal work of breathing. Neurologic: No focal deficits.   Lab Results  Component Value Date   CREATININE 1.18 (H) 01/02/2020   BUN 17 01/02/2020   NA 141 01/02/2020   K 4.5 01/02/2020   CL 102 01/02/2020   CO2 27 01/02/2020   Lab Results  Component Value Date   ALT 22 01/02/2020   AST 13 01/02/2020   ALKPHOS 131 (H) 01/02/2020   BILITOT 0.4 01/02/2020   Lab Results  Component Value Date   HGBA1C 6.3 (H) 01/02/2020   HGBA1C 6.3 (H) 02/26/2019   Lab Results  Component Value Date   INSULIN 21.0 02/26/2019   Lab Results  Component Value Date   TSH 0.783 01/02/2020   Lab Results  Component Value Date   CHOL 178 01/02/2020   HDL 74 01/02/2020   LDLCALC 90 01/02/2020   TRIG 78  01/02/2020   CHOLHDL 2.4 01/02/2020   Lab Results  Component Value Date   VD25OH 29.7 (L) 01/02/2020   VD25OH 24.2 (L) 02/26/2019   Lab Results  Component Value Date   WBC 4.8 01/02/2020   HGB 14.9 01/02/2020   HCT 42.9 01/02/2020   MCV 95 01/02/2020   PLT 172 01/02/2020   Obesity Behavioral Intervention:   Approximately 15 minutes were spent on the discussion below.  ASK: We discussed the diagnosis of obesity  with Jeanne [name] today and Jeanne [name] agreed to give [name] permission to discuss obesity behavioral modification therapy today.  ASSESS: Jeanne [name] has the diagnosis of obesity and her BMI today is 35.5. Jeanne [name] is in the action stage of change.   ADVISE: Jeanne [name] was educated on the multiple health risks of obesity as well as the benefit of weight loss to improve her health. She was advised of the need for long term treatment and the importance of lifestyle modifications to improve her current health and to decrease her risk of future health problems.  AGREE: Multiple dietary modification options and treatment options were discussed and Jeanne [name] agreed to follow the recommendations documented in the above note.  ARRANGE: Jeanne [name] was educated on the importance of frequent visits to treat obesity as outlined per CMS and USPSTF guidelines and agreed to schedule her next follow up appointment today.  Attestation Statements:   Reviewed by clinician on day of visit: allergies, medications, problem list, medical history, surgical history, family history, social history, and previous encounter notes.  I, Water quality scientist, CMA, am acting as transcriptionist for [name], DO  I have reviewed the above documentation for accuracy and completeness, and I agree with the above. [name], DO

## 2020-10-28 NOTE — Progress Notes (Signed)
[facility] PRE[facility] Weekly Session   Patient Details  Name: Jeanne [name] MRN: [id number] Date of Birth: October 10, 1960 Age: 60 y.o. PCP: Velna Hatchet, MD  Vitals:   10/28/20 1456  Weight: 245 lb 12.8 oz (111.5 kg)     [facility] Weekly seesion - 10/28/20 1400       Weekly Session   Topic Discussed Health habits;Water    Minutes exercised this week 360 minutes    Classes attended to date 5            Class at [facility] 10/28/2020, 2:58 PM

## 2020-10-29 ENCOUNTER — Other Ambulatory Visit: Payer: Self-pay

## 2020-10-29 ENCOUNTER — Ambulatory Visit: Payer: Medicare Other | Admitting: Podiatry

## 2020-10-29 ENCOUNTER — Encounter: Payer: Self-pay | Admitting: Podiatry

## 2020-10-29 DIAGNOSIS — L84 Corns and callosities: Secondary | ICD-10-CM | POA: Diagnosis not present

## 2020-10-29 DIAGNOSIS — B351 Tinea unguium: Secondary | ICD-10-CM | POA: Diagnosis not present

## 2020-10-29 DIAGNOSIS — M79675 Pain in left toe(s): Secondary | ICD-10-CM | POA: Diagnosis not present

## 2020-10-29 DIAGNOSIS — E1142 Type 2 diabetes mellitus with diabetic polyneuropathy: Secondary | ICD-10-CM | POA: Diagnosis not present

## 2020-10-29 DIAGNOSIS — M79674 Pain in right toe(s): Secondary | ICD-10-CM

## 2020-10-29 NOTE — Progress Notes (Signed)
This patient returns to my office for at risk foot care.  This patient requires this care by a professional since this patient will be at risk due to having type 2 diabetes with kidney disease. This patient is unable to cut nails herself since the patient cannot reach her nails.These nails are painful walking and wearing shoes.   Patient has painful callus on the tip second toe left foot.This patient presents for at risk foot care today.  General Appearance  Alert, conversant and in no acute stress.  Vascular  Dorsalis pedis and posterior tibial  pulses are palpable  bilaterally.  Capillary return is within normal limits  bilaterally. Temperature is within normal limits  bilaterally.  Neurologic  Senn-Weinstein monofilament wire test within normal limits  bilaterally. Muscle power within normal limits bilaterally.  Nails Thick disfigured discolored nails with subungual debris  from hallux to fifth toes bilaterally. No evidence of bacterial infection or drainage bilaterally.  Orthopedic  No limitations of motion  feet .  No crepitus or effusions noted.  No bony pathology or digital deformities noted. Hammer toes 2-4  B/L  Skin  normotropic skin with no porokeratosis noted bilaterally.  No signs of infections or ulcers noted.     Onychomycosis  Pain in right toes  Pain in left toes  Consent was obtained for treatment procedures.   Mechanical debridement of nails 1-5  bilaterally performed with a nail nipper.  Filed with dremel without incident. Debride clavi second toe left foot.with # 15 blade followed by dremel tool.   Return office visit   3 months                  Told patient to return for periodic foot care and evaluation due to potential at risk complications.   Gardiner Barefoot DPM

## 2020-10-30 ENCOUNTER — Other Ambulatory Visit (INDEPENDENT_AMBULATORY_CARE_PROVIDER_SITE_OTHER): Payer: Self-pay | Admitting: Family Medicine

## 2020-11-04 ENCOUNTER — Ambulatory Visit (INDEPENDENT_AMBULATORY_CARE_PROVIDER_SITE_OTHER): Payer: Medicare Other | Admitting: Psychology

## 2020-11-04 DIAGNOSIS — F331 Major depressive disorder, recurrent, moderate: Secondary | ICD-10-CM | POA: Diagnosis not present

## 2020-11-04 NOTE — Progress Notes (Signed)
Office: [phone]  /  Fax: [phone]    Date: November 18, 2020   Appointment Start Time: 8:03am Duration: 25 minutes Provider: Glennie Isle, Psy.D. Type of Session: Individual Therapy  Location of Patient: Home Location of Provider: Provider's home (private office) Type of Contact: Telepsychological Visit via MyChart Video Visit  Session Content: Jeanne [name] is a 60 y.o. female presenting for a follow-up appointment to address the previously established treatment goal of increasing coping skills. Today's appointment was a telepsychological visit due to COVID-19. Jeanne [name] provided verbal consent for today's telepsychological appointment and she is aware she is responsible for securing confidentiality on her end of the session. Prior to proceeding with today's appointment, Jeanne [name]'s physical location at the time of this appointment was obtained as well a phone number she could be reached at in the event of technical difficulties. Jeanne [name] and this provider participated in today's telepsychological service.   This provider conducted a brief check-in. Jeanne [name] shared about a recent vacation, including a family reunion and "sister trip." She discussed deviations from her typical eating habits. She indicated she has two more upcoming trips, noting, "The challenge is eating healthfully while I'm not at home." This provider and Jeanne [name] discussed what went well (e.g., engaging in portion control, mindful eating, making better choices) regarding eating during the recent vacation and what she can do differently for the upcoming trips (e.g., pack fruit, protein bars, tuna packets, discuss restaurant options with sisters, engage in physical activity at the hotel, walk at stops during tour). Positive reinforcement was provided. Moreover, psychoeducation regarding making better choices and engaging in portion control during the holidays/celebrations/vacations was provided. More specifically, this  provider discussed the following strategies: coming to meals hungry, but not starving; avoid filling up on appetizers; managing portion sizes; not completely depriving yourself; making the plate colorful (e.g., vegetables); pacing yourself (e.g., waiting 10 minutes before going back for seconds); taking advantage of the nutritious foods; practicing mindfulness; staying hydrated; and avoid bringing home leftovers. Jeanne [name] was receptive to today's appointment as evidenced by openness to sharing, responsiveness to feedback, and willingness to implement discussed strategies .  Mental Status Examination:  Appearance: well groomed and appropriate hygiene  Behavior: appropriate to circumstances Mood: euthymic Affect: mood congruent Speech: normal in rate, volume, and tone Eye Contact: appropriate Psychomotor Activity: unable to assess Gait: unable to assess Thought Process: linear, logical, and goal directed  Thought Content/Perception: no hallucinations, delusions, bizarre thinking or behavior reported or observed and no evidence or endorsement of suicidal and homicidal ideation, plan, and intent Orientation: time, person, place, and purpose of appointment Memory/Concentration: memory, attention, language, and fund of knowledge intact  Insight/Judgment: good  Interventions:  Conducted a brief chart review Provided empathic reflections and validation Reviewed content from the previous session Employed supportive psychotherapy interventions to facilitate reduced distress, and to improve coping skills with identified stressors Psychoeducation provided regarding strategies for vacation/holidays/celebrations   DSM-5 Diagnosis(es):  F50.89 Other Specified Feeding or Eating Disorder, Emotional Eating Behaviors and F25.1 Schizoaffective Disorder, Depressive Type  Treatment Goal & Progress: During the initial appointment with this provider, the following treatment goal was established: increase coping  skills. Jeanne [name] has demonstrated progress in her goal as evidenced by increased awareness of hunger patterns and increased awareness of triggers for emotional eating behaviors. Jeanne [name] also continues to demonstrate willingness to engage in learned skill(s).  Plan: Due to Jeanne [name]'s upcoming trips, the next appointment will be scheduled in 3-4 weeks, which will be via MyChart Video Visit. The next session will  focus on working towards the established treatment goal.

## 2020-11-11 NOTE — Progress Notes (Signed)
[facility] PRE[facility] Weekly Session   Patient Details  Name: Jeanne [name] MRN: [id number] Date of Birth: 1960-08-22 Age: 60 y.o. PCP: Velna Hatchet, MD  Vitals:   11/11/20 1524  Weight: 248 lb 12.8 oz (112.9 kg)     [facility] Weekly seesion - 11/11/20 1500       Weekly Session   Topic Discussed Stress management and problem solving   meditation and chair yoga   Minutes exercised this week 525 minutes   would benefit from decreasing cardio and more strength train   Classes attended to date 7             Class held at [facility] 11/11/2020, 3:25 PM

## 2020-11-18 ENCOUNTER — Telehealth (INDEPENDENT_AMBULATORY_CARE_PROVIDER_SITE_OTHER): Payer: Medicare Other | Admitting: Psychology

## 2020-11-18 DIAGNOSIS — F251 Schizoaffective disorder, depressive type: Secondary | ICD-10-CM | POA: Diagnosis not present

## 2020-11-18 DIAGNOSIS — F5089 Other specified eating disorder: Secondary | ICD-10-CM

## 2020-11-18 NOTE — Progress Notes (Signed)
[facility] PRE[facility] Weekly Session   Patient Details  Name: Jeanne [name] MRN: [id number] Date of Birth: 23-Apr-1960 Age: 60 y.o. PCP: Velna Hatchet, MD  Vitals:   11/18/20 1542  Weight: 245 lb 6.4 oz (111.3 kg)     [facility] Weekly seesion - 11/18/20 1500       Weekly Session   Topic Discussed Expectations and non-scale victories    Minutes exercised this week 300 minutes    Classes attended to date 9            Class held at [facility] 11/18/2020, 3:43 PM

## 2020-11-20 ENCOUNTER — Ambulatory Visit (INDEPENDENT_AMBULATORY_CARE_PROVIDER_SITE_OTHER): Payer: Medicare Other | Admitting: Family Medicine

## 2020-11-20 ENCOUNTER — Other Ambulatory Visit: Payer: Self-pay

## 2020-11-20 ENCOUNTER — Encounter (INDEPENDENT_AMBULATORY_CARE_PROVIDER_SITE_OTHER): Payer: Self-pay | Admitting: Family Medicine

## 2020-11-20 VITALS — BP 105/67 | HR 71 | Temp 98.1°F | Ht 69.0 in | Wt 242.0 lb

## 2020-11-20 DIAGNOSIS — E1159 Type 2 diabetes mellitus with other circulatory complications: Secondary | ICD-10-CM | POA: Diagnosis not present

## 2020-11-20 DIAGNOSIS — E1169 Type 2 diabetes mellitus with other specified complication: Secondary | ICD-10-CM | POA: Diagnosis not present

## 2020-11-20 DIAGNOSIS — Z6841 Body Mass Index (BMI) 40.0 and over, adult: Secondary | ICD-10-CM

## 2020-11-20 DIAGNOSIS — E559 Vitamin D deficiency, unspecified: Secondary | ICD-10-CM | POA: Diagnosis not present

## 2020-11-20 DIAGNOSIS — Z794 Long term (current) use of insulin: Secondary | ICD-10-CM

## 2020-11-20 DIAGNOSIS — I152 Hypertension secondary to endocrine disorders: Secondary | ICD-10-CM

## 2020-11-20 MED ORDER — VITAMIN D (ERGOCALCIFEROL) 1.25 MG (50000 UNIT) PO CAPS: ORAL_CAPSULE | ORAL | 0 refills | Status: DC

## 2020-11-20 MED ORDER — OZEMPIC (1 MG/DOSE) 4 MG/3ML ~~LOC~~ SOPN: 2.0000 mg | PEN_INJECTOR | SUBCUTANEOUS | 0 refills | Status: DC

## 2020-11-20 NOTE — Progress Notes (Signed)
Chief Complaint:   OBESITY Jeanne [name] is here to discuss her progress with her obesity treatment plan along with follow-up of her obesity related diagnoses. See Medical Weight Management Flowsheet for complete bioelectrical impedance results.  Today's visit was #: 42 Starting weight: 281 lbs Starting date: 02/26/2019 Today's weight: 242 lbs Today's date: 11/20/2020 Weight change since last visit: +2 lbs Total lbs lost to date: 39 lbs Body mass index is 35.74 kg/m.  Total weight loss percentage to date: -13.88%  Interim History: Jeanne [name] is up 2 pounds of muscle.  Last labs (08/08/2020) with her PCP.  She had 2 vacations and has 2 more upcoming, so her goal is maintenance.  Nutrition Plan: the Category 2 Plan for 25% of the time. Activity: Water aerobics for 240 minutes. Anti-obesity medications: Ozempic 2 mg subcutaneously weekly  Assessment/Plan:   1. Vitamin D deficiency Not at goal.  She is taking vitamin D 50,000 IU weekly.  Plan: Continue to take prescription Vitamin D '@50'$ ,000 IU every week as prescribed.  Follow-up for routine testing of Vitamin D, at least 2-3 times per year to avoid over-replacement.  Lab Results  Component Value Date   VD25OH 29.7 (L) 01/02/2020   VD25OH 24.2 (L) 02/26/2019   - Refill Vitamin D, Ergocalciferol, (DRISDOL) 1.25 MG (50000 UNIT) CAPS capsule; TAKE 1 CAPSULE BY MOUTH EVERY 7 DAYS  Dispense: 4 capsule; Refill: 0  2. Type 2 diabetes mellitus with other specified complication, with long-term current use of insulin ([place]) Diabetes Mellitus: Well controlled on current medications. Medication: Ozempic 2 mg subcutaneously weekly.  Plan: Will refill Ozempic today.  The patient will continue to focus on protein-rich, low simple carbohydrate foods. We reviewed the importance of hydration, regular exercise for stress reduction, and restorative sleep.   Lab Results  Component Value Date   HGBA1C 5.6 08/08/2020   HGBA1C 6.3 (H) 01/02/2020    HGBA1C 6.3 (H) 02/26/2019   Lab Results  Component Value Date   MICROALBUR 7.0 08/15/2020   LDLCALC 95 08/08/2020   CREATININE 1.2 (A) 08/08/2020   - Refill Semaglutide, 1 MG/DOSE, (OZEMPIC, 1 MG/DOSE,) 4 MG/3ML SOPN; Inject 2 mg into the skin once a week.  Dispense: 6 mL; Refill: 0  3. Hypertension associated with diabetes ([place]) At goal. Medications: Norvasc 5 mg daily, verapamil 180 mg daily, and Lasix 40 mg daily.   Plan: Avoid buying foods that are: processed, frozen, or prepackaged to avoid excess salt. We will watch for signs of hypotension as she continues lifestyle modifications.  Decrease Lasix by half if systolic blood pressure continues to be below 110.  BP Readings from Last 3 Encounters:  11/20/20 105/67  10/23/20 115/73  10/14/20 108/70   Lab Results  Component Value Date   CREATININE 1.2 (A) 08/08/2020   4. Obesity, current BMI 35.8  Course: Jeanne [name] is currently in the action stage of change. As such, her goal is to continue with weight loss efforts.   Nutrition goals: She has agreed to the Category 2 Plan.   Exercise goals:  As is.  Behavioral modification strategies: increasing lean protein intake, decreasing simple carbohydrates, increasing vegetables, and emotional eating strategies.  Jeanne [name] has agreed to follow-up with our clinic in 4 weeks. She was informed of the importance of frequent follow-up visits to maximize her success with intensive lifestyle modifications for her multiple health conditions.   Objective:   Blood pressure 105/67, pulse 71, temperature 98.1 F (36.7 C), temperature source Oral, height '5\' 9"'$  (1.753 m),  weight 242 lb (109.8 kg), SpO2 98 %. Body mass index is 35.74 kg/m.  General: Cooperative, alert, well developed, in no acute distress. HEENT: Conjunctivae and lids unremarkable. Cardiovascular: Regular rhythm.  Lungs: Normal work of breathing. Neurologic: No focal deficits.   Lab Results  Component Value Date    CREATININE 1.2 (A) 08/08/2020   BUN 12 08/08/2020   NA 135 (A) 08/08/2020   K 4.0 08/08/2020   CL 101 08/08/2020   CO2 23 (A) 08/08/2020   Lab Results  Component Value Date   ALT 28 08/08/2020   AST 21 08/08/2020   ALKPHOS 105 08/08/2020   BILITOT 0.4 01/02/2020   Lab Results  Component Value Date   HGBA1C 5.6 08/08/2020   HGBA1C 6.3 (H) 01/02/2020   HGBA1C 6.3 (H) 02/26/2019   Lab Results  Component Value Date   INSULIN 21.0 02/26/2019   Lab Results  Component Value Date   TSH 1.13 08/08/2020   Lab Results  Component Value Date   CHOL 177 08/08/2020   HDL 63 08/08/2020   LDLCALC 95 08/08/2020   TRIG 94 08/08/2020   CHOLHDL 2.4 01/02/2020   Lab Results  Component Value Date   VD25OH 29.7 (L) 01/02/2020   VD25OH 24.2 (L) 02/26/2019   Lab Results  Component Value Date   WBC 4.3 08/08/2020   HGB 14.7 08/08/2020   HCT 42 08/08/2020   MCV 95 01/02/2020   PLT 198 08/08/2020   Obesity Behavioral Intervention:   Approximately 15 minutes were spent on the discussion below.  ASK: We discussed the diagnosis of obesity with Jeanne [name] today and Jeanne [name] agreed to give [name] permission to discuss obesity behavioral modification therapy today.  ASSESS: Jeanne [name] has the diagnosis of obesity and her BMI today is 35.8. Jeanne [name] is in the action stage of change.   ADVISE: Jeanne [name] was educated on the multiple health risks of obesity as well as the benefit of weight loss to improve her health. She was advised of the need for long term treatment and the importance of lifestyle modifications to improve her current health and to decrease her risk of future health problems.  AGREE: Multiple dietary modification options and treatment options were discussed and Jeanne [name] agreed to follow the recommendations documented in the above note.  ARRANGE: Jeanne [name] was educated on the importance of frequent visits to treat obesity as outlined per CMS and USPSTF guidelines and agreed  to schedule her next follow up appointment today.  Attestation Statements:   Reviewed by clinician on day of visit: allergies, medications, problem list, medical history, surgical history, family history, social history, and previous encounter notes.  I, Water quality scientist, CMA, am acting as transcriptionist for [name], DO  I have reviewed the above documentation for accuracy and completeness, and I agree with the above. [name], DO

## 2020-11-24 ENCOUNTER — Encounter (INDEPENDENT_AMBULATORY_CARE_PROVIDER_SITE_OTHER): Payer: Self-pay

## 2020-11-25 ENCOUNTER — Ambulatory Visit: Payer: Medicare Other | Admitting: Psychology

## 2020-11-25 NOTE — Progress Notes (Signed)
[facility] PREP Weekly Session   Patient Details  Name: Jeanne [name] MRN: [id number] Date of Birth: 11-Dec-1960 Age: 60 y.o. PCP: Velna Hatchet, MD  Vitals:   11/25/20 1745  Weight: 246 lb 3.2 oz (111.7 kg)     [facility] Weekly seesion - 11/25/20 1700       Weekly Session   Topic Discussed --   Portions   Minutes exercised this week 505 minutes    Classes attended to date 11             Class held at [facility] 11/25/2020, 5:46 PM

## 2020-12-01 NOTE — Progress Notes (Signed)
  Office: [phone]  /  Fax: [phone]    Date: December 15, 2020   Appointment Start Time: 10:04am Duration: 26 minutes Provider: Glennie Isle, Psy.D. Type of Session: Individual Therapy  Location of Patient: Home Location of Provider: Provider's Home (private office) Type of Contact: Telepsychological Visit via MyChart Video Visit  Session Content: [name] is a 60 y.o. female presenting for a follow-up appointment to address the previously established treatment goal of increasing coping skills Today's appointment was a telepsychological visit due to COVID-19. Juri provided verbal consent for today's telepsychological appointment and she is aware she is responsible for securing confidentiality on her end of the session. Prior to proceeding with today's appointment, [name]'s physical location at the time of this appointment was obtained as well a phone number she could be reached at in the event of technical difficulties. [name] and this provider participated in today's telepsychological service.   This provider conducted a brief check-in. [name] shared about her recent trips, adding she experienced difficulty "managing food wise." She described feeling "discouraged" about her weight loss. She also feels not having the opportunity to engage in physical activity while away impacted her mood. This was further explored. Session focused on self-compassion. She was engaged in an exercise to assist with increasing self-compassion surrounding eating and exercising habits as well as weight loss. She was challenged to regularly ask herself, "What do I need right now?" Overall, [name] was receptive to today's appointment as evidenced by openness to sharing, responsiveness to feedback, and  willingness to focus on increasing self-compassion .  Mental Status Examination:  Appearance: well groomed and appropriate hygiene  Behavior: appropriate to circumstances Mood: sad Affect: mood  congruent Speech: normal in rate, volume, and tone Eye Contact: appropriate Psychomotor Activity: appropriate Gait: unable to assess Thought Process: linear, logical, and goal directed  Thought Content/Perception: no hallucinations, delusions, bizarre thinking or behavior reported or observed and denies suicidal, self-injurious, and homicidal ideation, plan, and intent Orientation: time, person, place, and purpose of appointment Memory/Concentration: memory, attention, language, and fund of knowledge intact  Insight/Judgment: good  Interventions:  Conducted a brief chart review Provided empathic reflections and validation Employed supportive psychotherapy interventions to facilitate reduced distress and to improve coping skills with identified stressors Psychoeducation provided about self-compassion Engaged patient in a self-compassion exercise  DSM-5 Diagnosis(es): F50.89 Other Specified Feeding or Eating Disorder, Emotional Eating Behaviors and F25.1 Schizoaffective Disorder, Depressive Type  Treatment Goal & Progress: During the initial appointment with this provider, the following treatment goal was established: increase coping skills. [name] has demonstrated progress in her goal as evidenced by increased awareness of hunger patterns, increased awareness of triggers for emotional eating behaviors, and reduction in emotional eating behaviors . [name] also continues to demonstrate willingness to engage in learned skill(s).  Plan: The next appointment will be scheduled in three weeks, which will be via MyChart Video Visit. The next session will focus on working towards the established treatment goal.

## 2020-12-02 NOTE — Progress Notes (Signed)
[facility] PREP Weekly Session   Patient Details  Name: Jeanne [name] MRN: [id number] Date of Birth: [birth year] Age: 60 y.o. PCP: Velna Hatchet, MD  Vitals:   12/02/20 1655  Weight: 241 lb 12.8 oz (109.7 kg)     [facility] Weekly sees[facility] - 12/02/20 1600       Weekly Session   Topic Discussed Finding support    Minutes exercised this week 535 minutes    Classes attended to date 13            Class held at [facility] 12/02/2020, 4:56 PM

## 2020-12-09 NOTE — Progress Notes (Signed)
[facility] PREP Weekly Session   Patient Details  Name: Jeanne [name] MRN: [id number] Date of Birth: Jan 27, 1961 Age: 60 y.o. PCP: Velna Hatchet, MD  Vitals:   12/09/20 1513  Weight: 249 lb 9.6 oz (113.2 kg)     [facility] Weekly seesion - 12/09/20 1500       Weekly Session   Topic Discussed Calorie breakdown    Minutes exercised this week 90 minutes    Classes attended to date 4            Class at [facility] 12/09/2020, 3:14 PM

## 2020-12-15 ENCOUNTER — Telehealth (INDEPENDENT_AMBULATORY_CARE_PROVIDER_SITE_OTHER): Payer: Medicare Other | Admitting: Psychology

## 2020-12-15 DIAGNOSIS — F5089 Other specified eating disorder: Secondary | ICD-10-CM

## 2020-12-15 DIAGNOSIS — F251 Schizoaffective disorder, depressive type: Secondary | ICD-10-CM | POA: Diagnosis not present

## 2020-12-16 NOTE — Progress Notes (Signed)
[facility] PREP Weekly Session   Patient Details  Name: Jeanne [name] MRN: [id number] Date of Birth: 10/11/1960 Age: 60 y.o. PCP: Velna Hatchet, MD  There were no vitals filed for this visit.   [facility] Weekly seesion - 12/16/20 1400       Weekly Session   Topic Discussed Hitting roadblocks    Minutes exercised this week 120 minutes    Classes attended to date 15            Class held at [facility] 12/16/2020, 2:37 PM

## 2020-12-18 ENCOUNTER — Ambulatory Visit (INDEPENDENT_AMBULATORY_CARE_PROVIDER_SITE_OTHER): Payer: Medicare Other | Admitting: Family Medicine

## 2020-12-18 ENCOUNTER — Other Ambulatory Visit: Payer: Self-pay

## 2020-12-18 ENCOUNTER — Encounter (INDEPENDENT_AMBULATORY_CARE_PROVIDER_SITE_OTHER): Payer: Self-pay | Admitting: Family Medicine

## 2020-12-18 VITALS — BP 106/66 | HR 65 | Temp 98.6°F | Ht 69.0 in | Wt 247.0 lb

## 2020-12-18 DIAGNOSIS — F17210 Nicotine dependence, cigarettes, uncomplicated: Secondary | ICD-10-CM | POA: Diagnosis not present

## 2020-12-18 DIAGNOSIS — E559 Vitamin D deficiency, unspecified: Secondary | ICD-10-CM | POA: Diagnosis not present

## 2020-12-18 DIAGNOSIS — E8881 Metabolic syndrome: Secondary | ICD-10-CM | POA: Diagnosis not present

## 2020-12-18 DIAGNOSIS — E1169 Type 2 diabetes mellitus with other specified complication: Secondary | ICD-10-CM | POA: Diagnosis not present

## 2020-12-18 DIAGNOSIS — Z6841 Body Mass Index (BMI) 40.0 and over, adult: Secondary | ICD-10-CM | POA: Diagnosis not present

## 2020-12-18 DIAGNOSIS — Z794 Long term (current) use of insulin: Secondary | ICD-10-CM

## 2020-12-18 MED ORDER — VITAMIN D (ERGOCALCIFEROL) 1.25 MG (50000 UNIT) PO CAPS: ORAL_CAPSULE | ORAL | 0 refills | Status: DC

## 2020-12-18 NOTE — Progress Notes (Signed)
Chief Complaint:   OBESITY Jeanne [name] is here to discuss her progress with her obesity treatment plan along with follow-up of her obesity related diagnoses.   Today's visit was #: 7 Starting weight: 281 lbs Starting date: 02/26/2019 Today's weight: 247 lbs Today's date: 12/18/2020 Weight change since last visit: +5 lbs Total lbs lost to date: 34 lbs Body mass index is 36.48 kg/m.  Total weight loss percentage to date: -12.10%  Current Meal Plan: the Category 2 Plan for 25% of the time.  Current Exercise Plan: Water aerobics for 150 minutes. Current Anti-Obesity Medications: Ozempic 2 mg subcutaneously weekly. Side effects: None.  Interim History:  [name]'s A1c is 5.4.  She is still smoking 1/2 ppd x30 - screening.  She went on vacation and says, "I lost my mind" eating everything.  Now back on track.  Assessment/Plan:   1. Metabolic syndrome Starting goal: Lose 7-10% of starting weight. She will continue to focus on protein-rich, low simple carbohydrate foods. We reviewed the importance of hydration, regular exercise for stress reduction, and restorative sleep.  We will continue to check lab work every 3 months, with 10% weight loss, or should any other concerns arise.  - CT CARDIAC SCORING ([facility] LOCATIONS ONLY); Future  2. Cigarette nicotine dependence, 1/2 ppd x 30 years Course: Ongoing. Plan: counseled patient on the dangers of tobacco use, advised patient to stop smoking, and reviewed strategies to maximize success.  3. Type 2 diabetes mellitus with other specified complication, with long-term current use of insulin ([place]) Diabetes Mellitus: Well controlled.  Medication: Ozempic 2 mg subcutaneously weekly. Issues reviewed: blood sugar goals, complications of diabetes mellitus, hypoglycemia prevention and treatment, exercise, and nutrition.   Plan:  Continue Ozempic.  The patient will continue to focus on protein-rich, low simple carbohydrate foods. We reviewed the  importance of hydration, regular exercise for stress reduction, and restorative sleep.   Lab Results  Component Value Date   HGBA1C 5.6 08/08/2020   HGBA1C 6.3 (H) 01/02/2020   HGBA1C 6.3 (H) 02/26/2019   Lab Results  Component Value Date   MICROALBUR 7.0 08/15/2020   LDLCALC 95 08/08/2020   CREATININE 1.2 (A) 08/08/2020   4. Vitamin D deficiency Not at goal.  She is taking vitamin D 50,000 IU weekly.  Plan: Continue to take prescription Vitamin D '@50'$ ,000 IU every week as prescribed.  Follow-up for routine testing of Vitamin D, at least 2-3 times per year to avoid over-replacement.  Lab Results  Component Value Date   VD25OH 29.7 (L) 01/02/2020   VD25OH 24.2 (L) 02/26/2019   - Refill Vitamin D, Ergocalciferol, (DRISDOL) 1.25 MG (50000 UNIT) CAPS capsule; TAKE 1 CAPSULE BY MOUTH EVERY 7 DAYS  Dispense: 4 capsule; Refill: 0  5. Obesity, current BMI 36.5  Course: [name] is currently in the action stage of change. As such, her goal is to continue with weight loss efforts.   Nutrition goals: She has agreed to the Category 2 Plan.   Exercise goals:  As is.  Behavioral modification strategies: increasing lean protein intake, increasing vegetables, increasing water intake, and decreasing liquid calories.  [name] has agreed to follow-up with our clinic in 6 weeks. She was informed of the importance of frequent follow-up visits to maximize her success with intensive lifestyle modifications for her multiple health conditions.   Objective:   Blood pressure 106/66, pulse 65, temperature 98.6 F (37 C), temperature source Oral, height '5\' 9"'$  (1.753 m), weight 247 lb (112 kg), SpO2 98 %.  Body mass index is 36.48 kg/m.  General: Cooperative, alert, well developed, in no acute distress. HEENT: Conjunctivae and lids unremarkable. Cardiovascular: Regular rhythm.  Lungs: Normal work of breathing. Neurologic: No focal deficits.   Lab Results  Component Value Date   CREATININE 1.2  (A) 08/08/2020   BUN 12 08/08/2020   NA 135 (A) 08/08/2020   K 4.0 08/08/2020   CL 101 08/08/2020   CO2 23 (A) 08/08/2020   Lab Results  Component Value Date   ALT 28 08/08/2020   AST 21 08/08/2020   ALKPHOS 105 08/08/2020   BILITOT 0.4 01/02/2020   Lab Results  Component Value Date   HGBA1C 5.6 08/08/2020   HGBA1C 6.3 (H) 01/02/2020   HGBA1C 6.3 (H) 02/26/2019   Lab Results  Component Value Date   INSULIN 21.0 02/26/2019   Lab Results  Component Value Date   TSH 1.13 08/08/2020   Lab Results  Component Value Date   CHOL 177 08/08/2020   HDL 63 08/08/2020   LDLCALC 95 08/08/2020   TRIG 94 08/08/2020   CHOLHDL 2.4 01/02/2020   Lab Results  Component Value Date   VD25OH 29.7 (L) 01/02/2020   VD25OH 24.2 (L) 02/26/2019   Lab Results  Component Value Date   WBC 4.3 08/08/2020   HGB 14.7 08/08/2020   HCT 42 08/08/2020   MCV 95 01/02/2020   PLT 198 08/08/2020   Obesity Behavioral Intervention:   Approximately 15 minutes were spent on the discussion below.  ASK: We discussed the diagnosis of obesity with Ziare today and Katey agreed to give [name] permission to discuss obesity behavioral modification therapy today.  ASSESS: [name] has the diagnosis of obesity and her BMI today is 36.5. [name] is in the action stage of change.   ADVISE: [name] was educated on the multiple health risks of obesity as well as the benefit of weight loss to improve her health. She was advised of the need for long term treatment and the importance of lifestyle modifications to improve her current health and to decrease her risk of future health problems.  AGREE: Multiple dietary modification options and treatment options were discussed and [name] agreed to follow the recommendations documented in the above note.  ARRANGE: [name] was educated on the importance of frequent visits to treat obesity as outlined per CMS and USPSTF guidelines and agreed to schedule her  next follow up appointment today.  Attestation Statements:   Reviewed by clinician on day of visit: allergies, medications, problem list, medical history, surgical history, family history, social history, and previous encounter notes.  I, Water quality scientist, CMA, am acting as transcriptionist for [name], DO  I have reviewed the above documentation for accuracy and completeness, and I agree with the above. [name], DO

## 2020-12-23 NOTE — Progress Notes (Signed)
  Office: [phone]  /  Fax: [phone]    Date: January 05, 2021   Appointment Start Time: 10:01am Duration: 21 minutes Provider: Glennie Isle, Psy.D. Type of Session: Individual Therapy  Location of Patient: Home (private location) Location of Provider: Provider's Home (private office) Type of Contact: Telepsychological Visit via MyChart Video Visit  Session Content: Jeanne [name] is a 60 y.o. female presenting for a follow-up appointment to address the previously established treatment goal of increasing coping skills.Today's appointment was a telepsychological visit due to COVID-19. Jeanne [name] provided verbal consent for today's telepsychological appointment and she is aware she is responsible for securing confidentiality on her end of the session. Prior to proceeding with today's appointment, Jeanne [name]'s physical location at the time of this appointment was obtained as well a phone number she could be reached at in the event of technical difficulties. Jeanne [name] and this provider participated in today's telepsychological service.   This provider conducted a brief check-in. Jeanne [name] stated she has "not done a really good job transitioning" back to her eating habits and exercise routine. Associated thoughts/feelings processed. She shared she is trying to practice self-compassion, but discussed it as being challenging. Additionally, Jeanne [name] indicated she is engaging in "guilt" eating and expressed worry about her eating/exercise routine while traveling in the coming week. Due to ongoing unpleasant feelings/thoughts related to the weight loss journey, session focused further on mindfulness/self-compassion to assist with coping. Jeanne [name] was led through a mindfulness exercise ( Body Loving Kindness ) and her experience was processed. She described an increase in hope. Jeanne [name] provided verbal consent during today's appointment for this provider to send the handout for today's exercise via  e-mail. Overall, Jeanne [name] was receptive to today's appointment as evidenced by openness to sharing, responsiveness to feedback, and willingness to continue engaging in learned skills.  Mental Status Examination:  Appearance: well groomed and appropriate hygiene  Behavior: appropriate to circumstances Mood: sad Affect: mood congruent Speech: normal in rate, volume, and tone Eye Contact: appropriate Psychomotor Activity: appropriate Gait: unable to assess Thought Process: linear, logical, and goal directed  Thought Content/Perception: no hallucinations, delusions, bizarre thinking or behavior reported or observed and no evidence or endorsement of suicidal and homicidal ideation, plan, and intent Orientation: time, person, place, and purpose of appointment Memory/Concentration: memory, attention, language, and fund of knowledge intact  Insight/Judgment: fair  Interventions:  Conducted a brief chart review Provided empathic reflections and validation Reviewed content from the previous session Employed supportive psychotherapy interventions to facilitate reduced distress and to improve coping skills with identified stressors Engaged patient in mindfulness exercise(s)  DSM-5 Diagnosis(es):  F50.89 Other Specified Feeding or Eating Disorder, Emotional Eating Behaviors and F25.1 Schizoaffective Disorder, Depressive Type  Treatment Goal & Progress: During the initial appointment with this provider, the following treatment goal was established: increase coping skills. Jeanne [name] has demonstrated progress in her goal as evidenced by increased awareness of hunger patterns and increased awareness of triggers for emotional eating behaviors. Jeanne [name] also continues to demonstrate willingness to engage in learned skill(s).  Plan: The next appointment will be scheduled in two weeks, which will be via MyChart Video Visit. The next session will focus on working towards the established treatment goal.

## 2020-12-30 NOTE — Progress Notes (Signed)
[facility] PRE[facility] Progress Report   Patient Details  Name: Jeanne [name] MRN: [id number] Date of Birth: 1960/11/25 Age: 60 y.o. PCP: Jeanne Hatchet, MD  Vitals:   12/25/20 1318  BP: 124/72  Pulse: 74  SpO2: 98%  Weight: 250 lb (113.4 kg)      [facility] Eval - 12/30/20 1500       Referral    Referring Provider self/[place]    Program Start Date --   PREP start 10/07/20 end date 9/13     Measurement   Waist Circumference 47.5 inches    Hip Circumference 51 inches    Body fat 44.4 percent      Information for Trainer   Goals --   has solid plans for continue well established exercise regimen     Mobility and Daily Activities   I find it easy to walk up or down two or more flights of stairs. --   pt has copy of results           Past Medical History:  Diagnosis Date   Antiphospholipid antibody syndrome (HCC)    Antiphospholipid antibody syndrome (HCC)    Anxiety    Arthritis    Bilateral swelling of feet    Carpal tunnel syndrome 01/02/2014   Bilateral   Clotting disorder ([place])    Constipation    Depression    Diabetes mellitus type 2, insulin dependent ([place]) 02/26/2019   Diabetes mellitus, type II ([place])    Difficult airway for intubation 03/13/2019   Difficult intubation 07/09/2013   Glidescope used, see Anesthesia note   DVT (deep venous thrombosis) ([place]) 2014   LEFT LEG   Gastritis    Glaucoma    Heart murmur    History of blood clots    Hyperlipidemia    Hypertension    Hypokalemia 03/05/2013   Kidney disease    stage ll   Neuromuscular disorder ([place])    " NEUROPATHY IN  MY HANDS"   Obsessive-compulsive disorder    Osteoarthritis    Rheumatoid arthritis ([place])    Rheumatoid arthritis ([place]) 2016   Schizo-affective psychosis ([place])    Schizoaffective disorder ([place])    Scleritis of both eyes    Sleep apnea    Bipap   Suppurative hidradenitis    Undifferentiated connective tissue disease ([place])    Past Surgical History:  Procedure Laterality  Date   BUNIONECTOMY     CERVICAL CONIZATION W/BX     COLONOSCOPY WITH PROPOFOL N/A 03/13/2019   Procedure: COLONOSCOPY WITH PROPOFOL;  Surgeon: Doran Stabler, MD;  Location: [facility];  Service: Gastroenterology;  Laterality: N/A;   LAPAROSCOPIC ADRENALECTOMY Left 07/09/2013   Procedure: LAPAROSCOPIC LEFT  ADRENALECTOMY;  Surgeon: Earnstine Regal, MD;  Location: [facility];  Service: General;  Laterality: Left;   POLYPECTOMY  03/13/2019   Procedure: POLYPECTOMY;  Surgeon: Doran Stabler, MD;  Location: [facility];  Service: Gastroenterology;;   sweat gland removal  1975   Social History   Tobacco Use  Smoking Status Every Day   Packs/day: 0.50   Years: 25.00   Pack years: 12.50   Types: Cigarettes  Smokeless Tobacco Never   Classes held at [facility] Pt has copy of 'How fit and strong you are survey' Cardio march: 245 to 296 Sit to stand: 5 (arms uncrossed) to 9 with arms crossed Bicep curl: 21 to 30 Improvement in single leg balance  (Late entry from 12/25/20)   [name] 12/30/2020, 3:21  PM   

## 2021-01-01 ENCOUNTER — Ambulatory Visit: Payer: Medicare Other | Admitting: Psychology

## 2021-01-01 ENCOUNTER — Other Ambulatory Visit: Payer: Self-pay

## 2021-01-01 DIAGNOSIS — F331 Major depressive disorder, recurrent, moderate: Secondary | ICD-10-CM

## 2021-01-05 ENCOUNTER — Telehealth (INDEPENDENT_AMBULATORY_CARE_PROVIDER_SITE_OTHER): Payer: Medicare Other | Admitting: Psychology

## 2021-01-05 DIAGNOSIS — F251 Schizoaffective disorder, depressive type: Secondary | ICD-10-CM | POA: Diagnosis not present

## 2021-01-05 DIAGNOSIS — F5089 Other specified eating disorder: Secondary | ICD-10-CM | POA: Diagnosis not present

## 2021-01-05 NOTE — Progress Notes (Signed)
  Office: [phone]  /  Fax: [phone]    Date: January 19, 2021   Appointment Start Time: 2:01pm Duration: 23 minutes Provider: Glennie Isle, Psy.D. Type of Session: Individual Therapy  Location of Patient: Home (private location) Location of Provider: Provider's Home (private office) Type of Contact: Telepsychological Visit via MyChart Video Visit  Session Content: Jeanne [name] is a 60 y.o. female presenting for a follow-up appointment to address the previously established treatment goal of increasing coping skills.Today's appointment was a telepsychological visit due to COVID-19. Jeanne [name] provided verbal consent for today's telepsychological appointment and she is aware she is responsible for securing confidentiality on her end of the session. Prior to proceeding with today's appointment, Jeanne [name]'s physical location at the time of this appointment was obtained as well a phone number she could be reached at in the event of technical difficulties. [place] and this provider participated in today's telepsychological service.   This provider conducted a brief check-in. Jeanne [name] shared about her recent trip, noting she engaged in "a lot of anticipatory eating." This was further explored and processed. She described making better choices and engaging in portion control. Positive reinforcement was provided. This provider also explored her eating habits since returning from her trip. Jeanne [name] described getting back on track and noting she is starting to go to the gym more. Reviewed self-compassion and reflected on changes she has started to notice as she is more compassionate with herself. Jeanne [name] reported a belief that having a mantra that "embraces grace and compassion" as she continues her journey would be helpful. She was encouraged to develop a mantra for homework; she agreed. Furthermore, termination planning was discussed. Jeanne [name] was receptive to a follow-up appointment in 3-4 weeks  and an additional follow-up/termination appointment in 3-4 weeks after that. Overall, Jeanne [name] was receptive to today's appointment as evidenced by openness to sharing, responsiveness to feedback, and willingness to continue engaging in learned skills.  Mental Status Examination:  Appearance: well groomed and appropriate hygiene  Behavior: appropriate to circumstances Mood: euthymic Affect: mood congruent Speech: normal in rate, volume, and tone Eye Contact: appropriate Psychomotor Activity: appropriate Gait: unable to assess Thought Process: linear, logical, and goal directed  Thought Content/Perception: no hallucinations, delusions, bizarre thinking or behavior reported or observed and no evidence or endorsement of suicidal and homicidal ideation, plan, and intent Orientation: time, person, place, and purpose of appointment Memory/Concentration: memory, attention, language, and fund of knowledge intact  Insight/Judgment: good  Interventions:  Conducted a brief chart review Provided empathic reflections and validation Provided positive reinforcement Employed supportive psychotherapy interventions to facilitate reduced distress and to improve coping skills with identified stressors Reviewed self-compassion  DSM-5 Diagnosis(es):  F50.89 Other Specified Feeding or Eating Disorder, Emotional Eating Behaviors and F25.1 Schizoaffective Disorder, Depressive Type  Treatment Goal & Progress: During the initial appointment with this provider, the following treatment goal was established: increase coping skills. Jeanne [name] has demonstrated progress in her goal as evidenced by increased awareness of hunger patterns, increased awareness of triggers for emotional eating behaviors, and reduction in emotional eating behaviors . Jeanne [name] also continues to demonstrate willingness to engage in learned skill(s).  Plan: The next appointment will be scheduled in 3-4 weeks, which will be via MyChart Video  Visit. The next session will focus on working towards the established treatment goal.

## 2021-01-06 ENCOUNTER — Encounter (INDEPENDENT_AMBULATORY_CARE_PROVIDER_SITE_OTHER): Payer: Self-pay | Admitting: Family Medicine

## 2021-01-15 ENCOUNTER — Other Ambulatory Visit: Payer: Self-pay

## 2021-01-15 ENCOUNTER — Telehealth (HOSPITAL_BASED_OUTPATIENT_CLINIC_OR_DEPARTMENT_OTHER): Payer: Medicare Other | Admitting: Psychiatry

## 2021-01-15 ENCOUNTER — Ambulatory Visit (INDEPENDENT_AMBULATORY_CARE_PROVIDER_SITE_OTHER): Payer: Medicare Other | Admitting: Family Medicine

## 2021-01-15 DIAGNOSIS — F429 Obsessive-compulsive disorder, unspecified: Secondary | ICD-10-CM

## 2021-01-15 DIAGNOSIS — F251 Schizoaffective disorder, depressive type: Secondary | ICD-10-CM

## 2021-01-15 MED ORDER — BUPROPION HCL ER (XL) 150 MG PO TB24: 450.0000 mg | ORAL_TABLET | Freq: Every day | ORAL | 1 refills | Status: DC

## 2021-01-15 MED ORDER — QUETIAPINE FUMARATE 300 MG PO TABS: 600.0000 mg | ORAL_TABLET | Freq: Every day | ORAL | 1 refills | Status: DC

## 2021-01-15 MED ORDER — ARIPIPRAZOLE 30 MG PO TABS: 30.0000 mg | ORAL_TABLET | Freq: Every day | ORAL | 1 refills | Status: DC

## 2021-01-15 MED ORDER — DULOXETINE HCL 30 MG PO CPEP: 90.0000 mg | ORAL_CAPSULE | Freq: Every day | ORAL | 1 refills | Status: DC

## 2021-01-15 NOTE — Progress Notes (Signed)
Virtual Visit via Video Note  I connected with Jeanne [name] on 01/15/21 at  9:00 AM EDT by a video enabled telemedicine application and verified that I am speaking with the correct person using two identifiers.  Location: Patient: home Provider: office   I discussed the limitations of evaluation and management by telemedicine and the availability of in person appointments. The patient expressed understanding and agreed to proceed.  History of Present Illness: Jeanne [name] is still going to the gym regularly. She is still working with the healthy weight clinic and has gained 7 lbs while on 2 vacations back to back. She went went on short trip with her sister and then again with sister and nieces and nephews to the beach. She has been off her eating plan for the last 30 days but is getting back on track. Jeanne [name] states her mood is stable. Her sleep is good. Over the last one month she denies any depression and anxiety.  She denies any engagement in OCD. She denies anhedonia, isolation and hopelessness. Jeanne [name] denies AVH, paranoia and ideas of reference. She denies SI/HI.    Observations/Objective: Psychiatric Specialty Exam: ROS  There were no vitals taken for this visit.There is no height or weight on file to calculate BMI.  General Appearance: Fairly Groomed and Neat  Eye Contact:  Good  Speech:  Clear and Coherent and Normal Rate  Volume:  Normal  Mood:  Euthymic  Affect:  Full Range  Thought Process:  Goal Directed, Linear, and Descriptions of Associations: Intact  Orientation:  Full (Time, Place, and Person)  Thought Content:  Logical  Suicidal Thoughts:  No  Homicidal Thoughts:  No  Memory:  Immediate;   Good  Judgement:  Good  Insight:  Good  Psychomotor Activity:  Normal  Concentration:  Concentration: Good  Recall:  Good  Fund of Knowledge:  Good  Language:  Good  Akathisia:  No  Handed:  Right  AIMS (if indicated):     Assets:  Communication Skills Desire for  Improvement Financial Resources/Insurance Housing Leisure Time [place] Talents/Skills Transportation Vocational/Educational  ADL's:  Intact  Cognition:  WNL  Sleep:        Assessment and Plan:  Depression screen [facility] 2/9 01/15/2021 10/16/2020 07/24/2020 06/19/2020 02/26/2019  Decreased Interest 0 0 0 2 3  Down, Depressed, Hopeless 0 0 1 3 3   PHQ - 2 Score 0 0 1 5 6   Altered sleeping - - 0 0 0  Tired, decreased energy - - 0 3 3  Change in appetite - - 0 2 2  Feeling bad or failure about yourself  - - 0 0 2  Trouble concentrating - - 0 0 0  Moving slowly or fidgety/restless - - 0 0 0  Suicidal thoughts - - 0 0 0  PHQ-9 Score - - 1 10 13   Difficult doing work/chores - - Not difficult at all Somewhat difficult Somewhat difficult    Flowsheet Row Video Visit from 01/15/2021 in [facility] Video Visit from 10/16/2020 in [facility] Video Visit from 07/24/2020 in [facility]  [place] No Risk No Risk No Risk        - reviewed labs 08/15/20 and her Na was mildly decreased, CBC, HbA1c, lipid panel and TSH all wnl  - referred 1-800-quit line for smoking cessation  1. Schizoaffective disorder, depressive type (HCC) - ARIPiprazole (ABILIFY) 30 MG tablet; Take 1 tablet (30 mg  total) by mouth daily.  Dispense: 90 tablet; Refill: 1 - buPROPion (WELLBUTRIN XL) 150 MG 24 hr tablet; Take 3 tablets (450 mg total) by mouth daily.  Dispense: 270 tablet; Refill: 1 - DULoxetine (CYMBALTA) 30 MG capsule; Take 3 capsules (90 mg total) by mouth daily.  Dispense: 270 capsule; Refill: 1 - QUEtiapine (SEROQUEL) 300 MG tablet; Take 2 tablets (600 mg total) by mouth at bedtime.  Dispense: 180 tablet; Refill: 1  2. Obsessive-compulsive disorder, unspecified type - DULoxetine (CYMBALTA) 30 MG capsule; Take 3 capsules (90 mg total) by mouth daily.   Dispense: 270 capsule; Refill: 1   Follow Up Instructions: In 3- 6 months or sooner if needed   I discussed the assessment and treatment plan with the patient. The patient was provided an opportunity to ask questions and all were answered. The patient agreed with the plan and demonstrated an understanding of the instructions.   The patient was advised to call back or seek an in-person evaluation if the symptoms worsen or if the condition fails to improve as anticipated.  I provided 12 minutes of non-face-to-face time during this encounter.   Charlcie Cradle, MD

## 2021-01-16 ENCOUNTER — Encounter (INDEPENDENT_AMBULATORY_CARE_PROVIDER_SITE_OTHER): Payer: Self-pay | Admitting: Family Medicine

## 2021-01-16 DIAGNOSIS — E559 Vitamin D deficiency, unspecified: Secondary | ICD-10-CM

## 2021-01-16 DIAGNOSIS — Z794 Long term (current) use of insulin: Secondary | ICD-10-CM

## 2021-01-16 DIAGNOSIS — E1169 Type 2 diabetes mellitus with other specified complication: Secondary | ICD-10-CM

## 2021-01-19 ENCOUNTER — Telehealth (INDEPENDENT_AMBULATORY_CARE_PROVIDER_SITE_OTHER): Payer: Medicare Other | Admitting: Psychology

## 2021-01-19 DIAGNOSIS — F5089 Other specified eating disorder: Secondary | ICD-10-CM

## 2021-01-19 DIAGNOSIS — F251 Schizoaffective disorder, depressive type: Secondary | ICD-10-CM | POA: Diagnosis not present

## 2021-01-19 MED ORDER — OZEMPIC (1 MG/DOSE) 4 MG/3ML ~~LOC~~ SOPN: 2.0000 mg | PEN_INJECTOR | SUBCUTANEOUS | 0 refills | Status: DC

## 2021-01-19 MED ORDER — VITAMIN D (ERGOCALCIFEROL) 1.25 MG (50000 UNIT) PO CAPS: ORAL_CAPSULE | ORAL | 0 refills | Status: DC

## 2021-01-19 NOTE — Telephone Encounter (Signed)
Pt last seen by Dr. Wallace.  

## 2021-01-21 ENCOUNTER — Other Ambulatory Visit (INDEPENDENT_AMBULATORY_CARE_PROVIDER_SITE_OTHER): Payer: Self-pay

## 2021-01-21 ENCOUNTER — Other Ambulatory Visit: Payer: Medicare Other

## 2021-01-21 MED ORDER — OZEMPIC (2 MG/DOSE) 8 MG/3ML ~~LOC~~ SOPN: 2.0000 mg | PEN_INJECTOR | SUBCUTANEOUS | 0 refills | Status: DC

## 2021-02-02 NOTE — Progress Notes (Signed)
Entered in error

## 2021-02-04 ENCOUNTER — Ambulatory Visit: Payer: Medicare Other | Admitting: Podiatry

## 2021-02-05 ENCOUNTER — Encounter (INDEPENDENT_AMBULATORY_CARE_PROVIDER_SITE_OTHER): Payer: Self-pay | Admitting: Family Medicine

## 2021-02-05 ENCOUNTER — Ambulatory Visit: Payer: Medicare Other | Admitting: Psychology

## 2021-02-09 ENCOUNTER — Telehealth (INDEPENDENT_AMBULATORY_CARE_PROVIDER_SITE_OTHER): Payer: Medicare Other | Admitting: Family Medicine

## 2021-02-09 ENCOUNTER — Encounter (INDEPENDENT_AMBULATORY_CARE_PROVIDER_SITE_OTHER): Payer: Self-pay

## 2021-02-10 ENCOUNTER — Ambulatory Visit (INDEPENDENT_AMBULATORY_CARE_PROVIDER_SITE_OTHER): Payer: Medicare Other | Admitting: Psychology

## 2021-02-10 DIAGNOSIS — F331 Major depressive disorder, recurrent, moderate: Secondary | ICD-10-CM | POA: Diagnosis not present

## 2021-02-13 ENCOUNTER — Ambulatory Visit: Payer: Medicare Other | Admitting: Psychology

## 2021-02-16 ENCOUNTER — Encounter (INDEPENDENT_AMBULATORY_CARE_PROVIDER_SITE_OTHER): Payer: Medicare Other | Admitting: Psychology

## 2021-02-16 ENCOUNTER — Telehealth (INDEPENDENT_AMBULATORY_CARE_PROVIDER_SITE_OTHER): Payer: Self-pay | Admitting: Psychology

## 2021-02-16 ENCOUNTER — Encounter (INDEPENDENT_AMBULATORY_CARE_PROVIDER_SITE_OTHER): Payer: Self-pay

## 2021-02-16 NOTE — Telephone Encounter (Signed)
  Office: [phone]  /  Fax: [phone]  Date of Encounter: February 16, 2021  Time of Encounter: 2:06pm Duration of Encounter: ~4.5 minutes Provider: Glennie Isle, PsyD  CONTENT: This provider called Jeanne [name] at 2:03pm as she did not present for today's appointment. She appeared to have forgotten about today's appointment, but indicated she would join immediately. As such, today's appointment was initiated 6 minutes late. Jeanne [name] explained she had to miss two appointments with Dr. Juleen China as she was sick with the flu and then Dr. Juleen China was out sick. Due to recent events, she acknowledged her eating habits have been impacted, but noted a plan to get back on track. She also disclosed that someone would be coming to repair her heater at any moment and she would have to end today's appointment. Thus, this provider discussed the option of rescheduling. Jeanne [name] was receptive and appreciative. A brief risk assessment was completed. Jeanne [name] denied experiencing suicidal and homicidal ideation, plan, or intent. All questions/concerns addressed.  PLAN: Jeanne [name] is scheduled for an appointment on February 23, 2021 at 3:00pm via [place] Visit. The no show fee for today's appointment will be waived.

## 2021-02-16 NOTE — Progress Notes (Signed)
  Office: [phone]  /  Fax: [phone]    Date: February 23, 2021   Appointment Start Time: 3:01pm Duration: 26 minutes Provider: Glennie Isle, Psy.D. Type of Session: Individual Therapy  Location of Patient: Home (private location) Location of Provider: Provider's Home (private office) Type of Contact: Telepsychological Visit via MyChart Video Visit  Session Content: Jeanne [name] is a 60 y.o. female presenting for a follow-up appointment to address the previously established treatment goal of increasing coping skills.Today's appointment was a telepsychological visit due to COVID-19. [name] provided verbal consent for today's telepsychological appointment and she is aware she is responsible for securing confidentiality on her end of the session. Prior to proceeding with today's appointment, [name]'s physical location at the time of this appointment was obtained as well a phone number she could be reached at in the event of technical difficulties. [name] and this provider participated in today's telepsychological service.   This provider conducted a brief check-in. [name] reported she had pneumonia since the last appointment with this provider, adding she has "only been symptom free for 4 or 5 days." She believes she has gained weight due to fluctuations in her appetite while sick. Nevertheless, [name] reported, "I feel like I'm getting back on track." Notably, she acknowledged feeling "discouraged" about the weight gain. Associated thoughts and feelings. Remainder of session focused on self-compassion. [name] was receptive to today's appointment as evidenced by openness to sharing, responsiveness to feedback, and willingness to implement discussed strategies .  Mental Status Examination:  Appearance: well groomed and appropriate hygiene  Behavior: appropriate to circumstances Mood: euthymic Affect: mood congruent Speech: normal in rate, volume, and tone Eye Contact:  appropriate Psychomotor Activity: appropriate Gait: unable to assess Thought Process: linear, logical, and goal directed  Thought Content/Perception: no hallucinations, delusions, bizarre thinking or behavior reported or observed and no evidence or endorsement of suicidal and homicidal ideation, plan, and intent Orientation: time, person, place, and purpose of appointment Memory/Concentration: memory, attention, language, and fund of knowledge intact  Insight/Judgment: good  Interventions:  Conducted a brief chart review Provided empathic reflections and validation Employed supportive psychotherapy interventions to facilitate reduced distress and to improve coping skills with identified stressors Engaged patient in thought challenging/cognitive restructuring   DSM-5 Diagnosis(es):  F50.89 Other Specified Feeding or Eating Disorder, Emotional Eating Behaviors and F25.1 Schizoaffective Disorder, Depressive Type  Treatment Goal & Progress: During the initial appointment with this provider, the following treatment goal was established: increase coping skills. [name] has demonstrated progress in her goal as evidenced by increased awareness of hunger patterns, increased awareness of triggers for emotional eating behaviors, and reduction in emotional eating behaviors . [name] also continues to demonstrate willingness to engage in learned skill(s).  Plan: The next appointment will be scheduled in one month, which will be via MyChart Video Visit. The next session will focus on working towards the established treatment goal.

## 2021-02-18 ENCOUNTER — Inpatient Hospital Stay: Admission: RE | Admit: 2021-02-18 | Payer: Medicare Other | Source: Ambulatory Visit

## 2021-02-18 ENCOUNTER — Ambulatory Visit: Payer: Medicare Other | Admitting: Podiatry

## 2021-02-23 ENCOUNTER — Telehealth (INDEPENDENT_AMBULATORY_CARE_PROVIDER_SITE_OTHER): Payer: Medicare Other | Admitting: Psychology

## 2021-02-23 ENCOUNTER — Encounter (INDEPENDENT_AMBULATORY_CARE_PROVIDER_SITE_OTHER): Payer: Self-pay | Admitting: Family Medicine

## 2021-02-23 DIAGNOSIS — F5089 Other specified eating disorder: Secondary | ICD-10-CM | POA: Diagnosis not present

## 2021-02-23 DIAGNOSIS — E1169 Type 2 diabetes mellitus with other specified complication: Secondary | ICD-10-CM

## 2021-02-23 DIAGNOSIS — F251 Schizoaffective disorder, depressive type: Secondary | ICD-10-CM

## 2021-02-24 MED ORDER — OZEMPIC (1 MG/DOSE) 4 MG/3ML ~~LOC~~ SOPN: 1.0000 mg | PEN_INJECTOR | SUBCUTANEOUS | 0 refills | Status: DC

## 2021-02-26 ENCOUNTER — Telehealth (INDEPENDENT_AMBULATORY_CARE_PROVIDER_SITE_OTHER): Payer: Self-pay

## 2021-02-26 DIAGNOSIS — E1169 Type 2 diabetes mellitus with other specified complication: Secondary | ICD-10-CM

## 2021-02-26 DIAGNOSIS — Z794 Long term (current) use of insulin: Secondary | ICD-10-CM

## 2021-02-26 NOTE — Telephone Encounter (Signed)
Pt called in and is requesting a refill on her Ozempic. The pt said that she is on the 2 mg but her pharmacy is out. Will another 1 mg be sent in. Pt has been out of meds for 8 days. Please advise

## 2021-03-02 MED ORDER — OZEMPIC (1 MG/DOSE) 4 MG/3ML ~~LOC~~ SOPN: 1.0000 mg | PEN_INJECTOR | SUBCUTANEOUS | 0 refills | Status: DC

## 2021-03-02 NOTE — Telephone Encounter (Signed)
Prior authorization started for Ozempic. Will notify patient and provider once response is received.

## 2021-03-05 ENCOUNTER — Encounter (INDEPENDENT_AMBULATORY_CARE_PROVIDER_SITE_OTHER): Payer: Self-pay

## 2021-03-05 ENCOUNTER — Telehealth (INDEPENDENT_AMBULATORY_CARE_PROVIDER_SITE_OTHER): Payer: Self-pay | Admitting: Family Medicine

## 2021-03-05 ENCOUNTER — Ambulatory Visit (INDEPENDENT_AMBULATORY_CARE_PROVIDER_SITE_OTHER): Payer: Medicare Other | Admitting: Psychology

## 2021-03-05 DIAGNOSIS — F331 Major depressive disorder, recurrent, moderate: Secondary | ICD-10-CM

## 2021-03-05 NOTE — Telephone Encounter (Signed)
Prior authorization denied for Ozempic. Patient sent mychart message.

## 2021-03-09 NOTE — Progress Notes (Signed)
  Office: [phone]  /  Fax: [phone]    Date: March 23, 2021   Appointment Start Time: 2:34pm Duration: 17 minutes Provider: Glennie Isle, Psy.D. Type of Session: Individual Therapy  Location of Patient: Home (private location) Location of Provider: Provider's Home (private office) Type of Contact: Telepsychological Visit via MyChart Video Visit  Session Content: Jeanne [name] is a 60 y.o. female presenting for a follow-up appointment to address the previously established treatment goal of increasing coping skills.Today's appointment was a telepsychological visit due to COVID-19. Jeanne [name] provided verbal consent for today's telepsychological appointment and she is aware she is responsible for securing confidentiality on her end of the session. Prior to proceeding with today's appointment, Jeanne [name]'s physical location at the time of this appointment was obtained as well a phone number she could be reached at in the event of technical difficulties. Jeanne [name] and this provider participated in today's telepsychological service.   This provider conducted a brief check-in. Jeanne [name] reported she was diagnosed with the flu after she had pneumonia, adding it was "exhausting." She is now feeling back to "normal." A plan was developed to help Jeanne [name] cope with emotional eating behaviors in the future using learned skills. She made note of the following: be kind to self; eat mindfully (e.g., not watching television when eating); call sisters for support; focus on hydration; be prepared with snacks congruent to the meal plan/goals; and pause to ask questions when triggered to eat (e.g., Am I really hungry?, Is there something bothering me?, and Will I feel better if I eat?). Overall, Jeanne [name] was receptive to today's appointment as evidenced by openness to sharing, responsiveness to feedback, and willingness to continue engaging in learned skills.  Mental Status Examination:  Appearance: well  groomed and appropriate hygiene  and casually dressed Behavior: appropriate to circumstances Mood: neutral Affect: mood congruent Speech: WNL Eye Contact: appropriate Psychomotor Activity: WNL Gait: unable to assess Thought Process: linear, logical, and goal directed and no endorsement or evidence of suicidal, homicidal, and self-harm ideation, plan and intent  Thought Content/Perception: no hallucinations, delusions, bizarre thinking or behavior endorsed or observed Orientation: AAOx4 Memory/Concentration: memory, attention, language, and fund of knowledge intact  Insight/Judgment: good  Interventions:  Conducted a brief chart review Provided empathic reflections and validation Employed supportive psychotherapy interventions to facilitate reduced distress and to improve coping skills with identified stressors Reviewed learned skills  DSM-5 Diagnosis(es):  F50.89 Other Specified Feeding or Eating Disorder, Emotional Eating Behaviors and F25.1 Schizoaffective Disorder, Depressive Type  Treatment Goal & Progress: During the initial appointment with this provider, the following treatment goal was established: increase coping skills. Jeanne [name] demonstrated progress in her goal as evidenced by increased awareness of hunger patterns, increased awareness of triggers for emotional eating behaviors, and reduction in emotional eating behaviors . Jeanne [name] also continues to demonstrate willingness to engage in learned skill(s).  Plan: As previously planned, today was Jeanne [name]'s last appointment with this provider. She will continue to meet with her primary therapist. She acknowledged understanding that she may request a follow-up appointment with this provider in the future as long as she is still established with the clinic. No further follow-up planned by this provider.

## 2021-03-11 ENCOUNTER — Other Ambulatory Visit (HOSPITAL_COMMUNITY): Payer: Self-pay

## 2021-03-11 MED ORDER — OSELTAMIVIR PHOSPHATE 75 MG PO CAPS
75.0000 mg | ORAL_CAPSULE | Freq: Two times a day (BID) | ORAL | 0 refills | Status: DC
  Filled 2021-03-11: qty 10, 5d supply, fill #0

## 2021-03-16 ENCOUNTER — Ambulatory Visit (INDEPENDENT_AMBULATORY_CARE_PROVIDER_SITE_OTHER): Payer: Medicare Other | Admitting: Family Medicine

## 2021-03-23 ENCOUNTER — Other Ambulatory Visit: Payer: Self-pay

## 2021-03-23 ENCOUNTER — Telehealth (INDEPENDENT_AMBULATORY_CARE_PROVIDER_SITE_OTHER): Payer: Medicare Other | Admitting: Psychology

## 2021-03-23 ENCOUNTER — Encounter (INDEPENDENT_AMBULATORY_CARE_PROVIDER_SITE_OTHER): Payer: Self-pay | Admitting: Family Medicine

## 2021-03-23 ENCOUNTER — Ambulatory Visit (INDEPENDENT_AMBULATORY_CARE_PROVIDER_SITE_OTHER): Payer: Medicare Other | Admitting: Family Medicine

## 2021-03-23 VITALS — BP 131/78 | HR 96 | Temp 98.3°F | Ht 69.0 in | Wt 257.0 lb

## 2021-03-23 DIAGNOSIS — E559 Vitamin D deficiency, unspecified: Secondary | ICD-10-CM | POA: Diagnosis not present

## 2021-03-23 DIAGNOSIS — E1169 Type 2 diabetes mellitus with other specified complication: Secondary | ICD-10-CM | POA: Diagnosis not present

## 2021-03-23 DIAGNOSIS — Z794 Long term (current) use of insulin: Secondary | ICD-10-CM

## 2021-03-23 DIAGNOSIS — E8881 Metabolic syndrome: Secondary | ICD-10-CM

## 2021-03-23 DIAGNOSIS — F17210 Nicotine dependence, cigarettes, uncomplicated: Secondary | ICD-10-CM

## 2021-03-23 DIAGNOSIS — F5089 Other specified eating disorder: Secondary | ICD-10-CM

## 2021-03-23 DIAGNOSIS — F251 Schizoaffective disorder, depressive type: Secondary | ICD-10-CM | POA: Diagnosis not present

## 2021-03-23 DIAGNOSIS — Z6841 Body Mass Index (BMI) 40.0 and over, adult: Secondary | ICD-10-CM

## 2021-03-23 MED ORDER — VITAMIN D (ERGOCALCIFEROL) 1.25 MG (50000 UNIT) PO CAPS: ORAL_CAPSULE | ORAL | 0 refills | Status: DC

## 2021-03-23 MED ORDER — OZEMPIC (2 MG/DOSE) 8 MG/3ML ~~LOC~~ SOPN
2.0000 mg | PEN_INJECTOR | SUBCUTANEOUS | 0 refills | Status: DC
Start: 2021-03-23 — End: 2021-04-21

## 2021-03-24 NOTE — Progress Notes (Signed)
Chief Complaint:   OBESITY Jeanne [name] is here to discuss her progress with her obesity treatment plan along with follow-up of her obesity related diagnoses. See Medical Weight Management Flowsheet for complete bioelectrical impedance results.  Today's visit was #: 52 Starting weight: 281 lbs Starting date: 02/26/2019 Weight change since last visit: +10 lbs Total lbs lost to date: 24 lbs Total weight loss percentage to date: -8.54%  Nutrition Plan: Category 2 Plan for 30% of the time.  Activity: Water aerobics for 45 minutes 2 times per week.   Interim History: Jeanne [name] says she had pneumonia for 1 month, then had the flu.  She will see her PCP on 1/19 for follow-up of Jardiance/metformin.  She was transitioned to these medications because she was unable to obtain Ozempic. She reports still smoking 1/2 ppd.  Assessment/Plan:   1. Type 2 diabetes mellitus with other specified complication, with long-term current use of insulin (HCC) Diabetes Mellitus: Controlled. Medication: Jardiance 25 mg daily, metformin 1,000 mg twice daily. Issues reviewed: blood sugar goals, complications of diabetes mellitus, hypoglycemia prevention and treatment, exercise, and nutrition.  Plan:  Continue Jardiance and metformin at current doses.  Increase Ozempic to 2 mg subcutaneously weekly.  Will refill Ozempic today.  Discussed ability to stop Metformin and Jardiance again once restarting Ozempic. The patient will continue to focus on protein-rich, low simple carbohydrate foods. We reviewed the importance of hydration, regular exercise for stress reduction, and restorative sleep.   Lab Results  Component Value Date   HGBA1C 5.6 08/08/2020   HGBA1C 6.3 (H) 01/02/2020   HGBA1C 6.3 (H) 02/26/2019   Lab Results  Component Value Date   MICROALBUR 7.0 08/15/2020   LDLCALC 95 08/08/2020   CREATININE 1.2 (A) 08/08/2020   - Increase Semaglutide, 2 MG/DOSE, (OZEMPIC, 2 MG/DOSE,) 8 MG/3ML SOPN; Inject 2 mg  into the skin once a week.  Dispense: 3 mL; Refill: 0  2. Vitamin D deficiency Not at goal.  She is taking vitamin D 50,000 IU weekly.  Plan: Continue to take prescription Vitamin D @50 ,000 IU every week as prescribed.  Follow-up for routine testing of Vitamin D, at least 2-3 times per year to avoid over-replacement.  Lab Results  Component Value Date   VD25OH 29.7 (L) 01/02/2020   VD25OH 24.2 (L) 02/26/2019   - Refill Vitamin D, Ergocalciferol, (DRISDOL) 1.25 MG (50000 UNIT) CAPS capsule; TAKE 1 CAPSULE BY MOUTH EVERY 7 DAYS  Dispense: 4 capsule; Refill: 0  3. Metabolic syndrome Starting goal: Lose 7-10% of starting weight. She will continue to focus on protein-rich, low simple carbohydrate foods. We reviewed the importance of hydration, regular exercise for stress reduction, and restorative sleep.  We will continue to check lab work every 3 months, with 10% weight loss, or should any other concerns arise.  4. Cigarette nicotine dependence, 1/2 ppd x 30 years Course: Ongoing.  Smoking 1/2 ppd.  Plan: counseled patient on the dangers of tobacco use, advised patient to stop smoking, and reviewed strategies to maximize success.  5. Obesity BMI today is 38  Course: Jeanne [name] is currently in the action stage of change. As such, her goal is to continue with weight loss efforts.   Nutrition goals: She has agreed to the Category 2 Plan.   Exercise goals:  As is.  Behavioral modification strategies: increasing lean protein intake, decreasing simple carbohydrates, increasing vegetables, increasing water intake, and decreasing liquid calories.  Jeanne [name] has agreed to follow-up with our clinic in 4 weeks. She  was informed of the importance of frequent follow-up visits to maximize her success with intensive lifestyle modifications for her multiple health conditions.   Objective:   Blood pressure 131/78, pulse 96, temperature 98.3 F (36.8 C), temperature source Oral, height 5\' 9"  (1.753 m),  weight 257 lb (116.6 kg), SpO2 96 %. Body mass index is 37.95 kg/m.  General: Cooperative, alert, well developed, in no acute distress. HEENT: Conjunctivae and lids unremarkable. Cardiovascular: Regular rhythm.  Lungs: Normal work of breathing. Neurologic: No focal deficits.   Lab Results  Component Value Date   CREATININE 1.2 (A) 08/08/2020   BUN 12 08/08/2020   NA 135 (A) 08/08/2020   K 4.0 08/08/2020   CL 101 08/08/2020   CO2 23 (A) 08/08/2020   Lab Results  Component Value Date   ALT 28 08/08/2020   AST 21 08/08/2020   ALKPHOS 105 08/08/2020   BILITOT 0.4 01/02/2020   Lab Results  Component Value Date   HGBA1C 5.6 08/08/2020   HGBA1C 6.3 (H) 01/02/2020   HGBA1C 6.3 (H) 02/26/2019   Lab Results  Component Value Date   INSULIN 21.0 02/26/2019   Lab Results  Component Value Date   TSH 1.13 08/08/2020   Lab Results  Component Value Date   CHOL 177 08/08/2020   HDL 63 08/08/2020   LDLCALC 95 08/08/2020   TRIG 94 08/08/2020   CHOLHDL 2.4 01/02/2020   Lab Results  Component Value Date   VD25OH 29.7 (L) 01/02/2020   VD25OH 24.2 (L) 02/26/2019   Lab Results  Component Value Date   WBC 4.3 08/08/2020   HGB 14.7 08/08/2020   HCT 42 08/08/2020   MCV 95 01/02/2020   PLT 198 08/08/2020   Attestation Statements:   Reviewed by clinician on day of visit: allergies, medications, problem list, medical history, surgical history, family history, social history, and previous encounter notes.  I, Water quality scientist, CMA, am acting as transcriptionist for [name], DO  I have reviewed the above documentation for accuracy and completeness, and I agree with the above. -  [name], DO, MS, FAAFP, DABOM - Family and Bariatric Medicine.

## 2021-04-02 ENCOUNTER — Ambulatory Visit (INDEPENDENT_AMBULATORY_CARE_PROVIDER_SITE_OTHER): Payer: Medicare Other | Admitting: Psychology

## 2021-04-02 DIAGNOSIS — F331 Major depressive disorder, recurrent, moderate: Secondary | ICD-10-CM | POA: Diagnosis not present

## 2021-04-02 NOTE — Progress Notes (Signed)
[facility] Counselor/Therapist Progress Note  Patient ID: Jeanne [name], MRN: [id number],    Date: 04/02/2021  Time Spent: 4:00pm-5:00pm   60 minutes   Treatment Type: Individual Therapy  Reported Symptoms: sadness  Mental Status Exam: Appearance:  Casual     Behavior: Appropriate  Motor: Normal  Speech/Language:  Normal Rate  Affect: Appropriate  Mood: normal  Thought process: normal  Thought content:   WNL  Sensory/Perceptual disturbances:   WNL  Orientation: oriented to person, place, time/date, and situation  Attention: Good  Concentration: Good  Memory: WNL  Fund of knowledge:  Good  Insight:   Good  Judgment:  Good  Impulse Control: Good   Risk Assessment: Danger to Self:  No Self-injurious Behavior: No Danger to Others: No Duty to Warn:no Physical Aggression / Violence:No  Access to Firearms a concern: No  Gang Involvement:No   Subjective: Pt present for face-to-face in person individual therapy.   Location of pt: office Location of therapist: office.  Pt talked about writing about her hallucinations.   She shared a copy of what she wrote.   She wrote about hallucinations she had several years ago.  Pt states it felt helpful to get it on paper.  Pt talked about the shame she feels regarding her mental illness.   She sees her brain as "damaged".  Pt states the shame "runs deep".   Addressed how pt can show more self compassion to herself and her brain.   Explored different language pt can use to define herself.   Reframed that instead of damaged her brain is "distracted" since it has to work so hard to keep the delusions at [place].   Helped pt process her thoughts and feelings.    Encouraged pt to continue to write.   Addressed the need to continue to work on shame issues.    Provided supportive therapy.   Interventions: Cognitive Behavioral Therapy and Insight-Oriented  Diagnosis: F33.1  Plan: See pt's Treatment Plan for depression in Therapy  Charts.  (Treatment Plan Target Date: 09/17/2021) Pt is progressing with treatment goals.   Plan to continue to see pt monthly.    Jenniferlynn Saad, LCSW

## 2021-04-21 ENCOUNTER — Encounter (INDEPENDENT_AMBULATORY_CARE_PROVIDER_SITE_OTHER): Payer: Self-pay | Admitting: Family Medicine

## 2021-04-21 ENCOUNTER — Ambulatory Visit (INDEPENDENT_AMBULATORY_CARE_PROVIDER_SITE_OTHER): Payer: Medicare Other | Admitting: Family Medicine

## 2021-04-21 ENCOUNTER — Other Ambulatory Visit: Payer: Self-pay

## 2021-04-21 VITALS — BP 116/69 | HR 82 | Temp 98.3°F | Ht 69.0 in | Wt 261.0 lb

## 2021-04-21 DIAGNOSIS — M179 Osteoarthritis of knee, unspecified: Secondary | ICD-10-CM | POA: Insufficient documentation

## 2021-04-21 DIAGNOSIS — F17210 Nicotine dependence, cigarettes, uncomplicated: Secondary | ICD-10-CM

## 2021-04-21 DIAGNOSIS — M069 Rheumatoid arthritis, unspecified: Secondary | ICD-10-CM | POA: Insufficient documentation

## 2021-04-21 DIAGNOSIS — Z79899 Other long term (current) drug therapy: Secondary | ICD-10-CM | POA: Insufficient documentation

## 2021-04-21 DIAGNOSIS — E559 Vitamin D deficiency, unspecified: Secondary | ICD-10-CM | POA: Diagnosis not present

## 2021-04-21 DIAGNOSIS — M359 Systemic involvement of connective tissue, unspecified: Secondary | ICD-10-CM

## 2021-04-21 DIAGNOSIS — E1122 Type 2 diabetes mellitus with diabetic chronic kidney disease: Secondary | ICD-10-CM | POA: Diagnosis not present

## 2021-04-21 DIAGNOSIS — E65 Localized adiposity: Secondary | ICD-10-CM | POA: Insufficient documentation

## 2021-04-21 DIAGNOSIS — M064 Inflammatory polyarthropathy: Secondary | ICD-10-CM | POA: Insufficient documentation

## 2021-04-21 DIAGNOSIS — E1169 Type 2 diabetes mellitus with other specified complication: Secondary | ICD-10-CM

## 2021-04-21 DIAGNOSIS — Z6841 Body Mass Index (BMI) 40.0 and over, adult: Secondary | ICD-10-CM

## 2021-04-21 DIAGNOSIS — Z86718 Personal history of other venous thrombosis and embolism: Secondary | ICD-10-CM | POA: Insufficient documentation

## 2021-04-21 DIAGNOSIS — H15049 Scleritis with corneal involvement, unspecified eye: Secondary | ICD-10-CM | POA: Insufficient documentation

## 2021-04-21 DIAGNOSIS — E1141 Type 2 diabetes mellitus with diabetic mononeuropathy: Secondary | ICD-10-CM | POA: Insufficient documentation

## 2021-04-21 DIAGNOSIS — K219 Gastro-esophageal reflux disease without esophagitis: Secondary | ICD-10-CM

## 2021-04-21 DIAGNOSIS — E785 Hyperlipidemia, unspecified: Secondary | ICD-10-CM

## 2021-04-21 DIAGNOSIS — N1831 Chronic kidney disease, stage 3a: Secondary | ICD-10-CM

## 2021-04-21 DIAGNOSIS — G8929 Other chronic pain: Secondary | ICD-10-CM | POA: Insufficient documentation

## 2021-04-21 DIAGNOSIS — R430 Anosmia: Secondary | ICD-10-CM | POA: Insufficient documentation

## 2021-04-21 HISTORY — DX: Systemic involvement of connective tissue, unspecified: M35.9

## 2021-04-21 MED ORDER — OZEMPIC (2 MG/DOSE) 8 MG/3ML ~~LOC~~ SOPN: 2.0000 mg | PEN_INJECTOR | SUBCUTANEOUS | 0 refills | Status: DC

## 2021-04-21 MED ORDER — VITAMIN D (ERGOCALCIFEROL) 1.25 MG (50000 UNIT) PO CAPS: ORAL_CAPSULE | ORAL | 0 refills | Status: DC

## 2021-04-21 NOTE — Progress Notes (Signed)
Chief Complaint:   OBESITY Jeanne [name] is here to discuss her progress with her obesity treatment plan along with follow-up of her obesity related diagnoses. See Medical Weight Management Flowsheet for complete bioelectrical impedance results.  Today's visit was #: 24 Starting weight: 281 lbs Starting date: 02/26/2019 Weight change since last visit: +4 lbs Total lbs lost to date: 20 lbs Total weight loss percentage to date: -7.12%  Nutrition Plan: Category 2 Plan for 25% of the time.  Activity: Water aerobics for 45 minutes 2 times per week.   Interim History: She is smoking 10 cigarettes per day.  She reports taking Percocet once a week.  She will meet with her DM nurse with her PCP this month (still no Ozempic 2 mg).  Her next infusion is on 1/25.  Assessment/Plan:   1. Vitamin D deficiency Not at goal. She is taking vitamin D 50,000 IU weekly.  Plan: Continue to take prescription Vitamin D @50 ,000 IU every week as prescribed.  Follow-up for routine testing of Vitamin D, at least 2-3 times per year to avoid over-replacement.  Lab Results  Component Value Date   VD25OH 29.7 (L) 01/02/2020   VD25OH 24.2 (L) 02/26/2019   - Refill Vitamin D, Ergocalciferol, (DRISDOL) 1.25 MG (50000 UNIT) CAPS capsule; TAKE 1 CAPSULE BY MOUTH EVERY 7 DAYS  Dispense: 4 capsule; Refill: 0  2. Type 2 diabetes mellitus with stage 3a chronic kidney disease, without long-term current use of insulin (HCC) Diabetes Mellitus: Controlled. Medication: Jardiance 25 mg daily, metformin 1000 mg twice daily.   Plan: Continue medications.  We will prioritize finding out how to get Ozempic.  The patient will continue to focus on protein-rich, low simple carbohydrate foods. We reviewed the importance of hydration, regular exercise for stress reduction, and restorative sleep.   Lab Results  Component Value Date   HGBA1C 5.6 08/08/2020   HGBA1C 6.3 (H) 01/02/2020   HGBA1C 6.3 (H) 02/26/2019   Lab Results   Component Value Date   MICROALBUR 7.0 08/15/2020   LDLCALC 95 08/08/2020   CREATININE 1.2 (A) 08/08/2020   - Refill Semaglutide, 2 MG/DOSE, (OZEMPIC, 2 MG/DOSE,) 8 MG/3ML SOPN; Inject 2 mg into the skin once a week.  Dispense: 3 mL; Refill: 0  3. Rheumatoid arthritis of other site, unspecified whether rheumatoid factor present (Jeanne [name]) Jeanne [name]'s next infusion is on 1/25. This issue directly impacts care plan for optimization of BMI and metabolic health as it impacts the patient's ability to make lifestyle changes. We discussed increasing swimming.  4. Hyperlipidemia associated with type 2 diabetes mellitus (Jeanne [name]) Course: Controlled. Lipid-lowering medications: Lipitor 20 mg daily.   Plan: Dietary changes: Increase soluble fiber, decrease simple carbohydrates, decrease saturated fat. Exercise changes: Moderate to vigorous-intensity aerobic activity 150 minutes per week or as tolerated. We will continue to monitor along with PCP/specialists as it pertains to her weight loss journey.  Lab Results  Component Value Date   CHOL 177 08/08/2020   HDL 63 08/08/2020   LDLCALC 95 08/08/2020   TRIG 94 08/08/2020   CHOLHDL 2.4 01/02/2020   Lab Results  Component Value Date   ALT 28 08/08/2020   AST 21 08/08/2020   ALKPHOS 105 08/08/2020   BILITOT 0.4 01/02/2020   The 10-year ASCVD risk score (Arnett DK, et al., 2019) is: 18.7%*   Values used to calculate the score:     Age: 61 years     Sex: Female     Is Non-Hispanic African American: Yes  Diabetic: Yes     Tobacco smoker: Yes     Systolic Blood Pressure: 161 mmHg     Is BP treated: Yes     HDL Cholesterol: 63 mg/dL*     Total Cholesterol: 177 mg/dL*     * - Cholesterol units were assumed for this score calculation  5. Visceral obesity Current visceral fat rating: 15. Visceral fat rating goal is < 13. Visceral adipose tissue is a hormonally active component of total body fat. This body composition phenotype is associated with  medical disorders such as metabolic syndrome, cardiovascular disease, and several malignancies including prostate, breast, and colorectal cancers. Starting goal: Lose 7-10% of starting weight.   6. Cigarette nicotine dependence, 1/2 ppd x 30 years Jeanne [name] says she is smoking 10 cigarettes per day.    Plan: counseled patient on the dangers of tobacco use, advised patient to stop smoking, and reviewed strategies to maximize success.  7. Gastroesophageal reflux disease without esophagitis She is taking Nexium 40 mg daily for GERD.  We reviewed the diagnosis of GERD and importance of treatment. We discussed "red flag" symptoms and the importance of follow up if symptoms persisted despite treatment. We reviewed non-pharmacologic management of GERD symptoms: including: caffeine reduction, dietary changes, elevate HOB, NPO after supper, reduction of alcohol intake, tobacco cessation, and weight loss.  8. Obesity BMI today is 38.7  Course: Jeanne [name] is currently in the action stage of change. As such, her goal is to continue with weight loss efforts.   Nutrition goals: She has agreed to the Category 2 Plan.   Exercise goals:  Increase water aerobics to 3-4 times per week.  Behavioral modification strategies: increasing lean protein intake, decreasing simple carbohydrates, increasing vegetables, and increasing water intake.  Jeanne [name] has agreed to follow-up with our clinic in 4 weeks. She was informed of the importance of frequent follow-up visits to maximize her success with intensive lifestyle modifications for her multiple health conditions.   Objective:   Blood pressure 116/69, pulse 82, temperature 98.3 F (36.8 C), temperature source Oral, height 5\' 9"  (1.753 m), weight 261 lb (118.4 kg), SpO2 96 %. Body mass index is 38.54 kg/m.  General: Cooperative, alert, well developed, in no acute distress. HEENT: Conjunctivae and lids unremarkable. Cardiovascular: Regular rhythm.  Lungs:  Normal work of breathing. Neurologic: No focal deficits.   Lab Results  Component Value Date   CREATININE 1.2 (A) 08/08/2020   BUN 12 08/08/2020   NA 135 (A) 08/08/2020   K 4.0 08/08/2020   CL 101 08/08/2020   CO2 23 (A) 08/08/2020   Lab Results  Component Value Date   ALT 28 08/08/2020   AST 21 08/08/2020   ALKPHOS 105 08/08/2020   BILITOT 0.4 01/02/2020   Lab Results  Component Value Date   HGBA1C 5.6 08/08/2020   HGBA1C 6.3 (H) 01/02/2020   HGBA1C 6.3 (H) 02/26/2019   Lab Results  Component Value Date   INSULIN 21.0 02/26/2019   Lab Results  Component Value Date   TSH 1.13 08/08/2020   Lab Results  Component Value Date   CHOL 177 08/08/2020   HDL 63 08/08/2020   LDLCALC 95 08/08/2020   TRIG 94 08/08/2020   CHOLHDL 2.4 01/02/2020   Lab Results  Component Value Date   VD25OH 29.7 (L) 01/02/2020   VD25OH 24.2 (L) 02/26/2019   Lab Results  Component Value Date   WBC 4.3 08/08/2020   HGB 14.7 08/08/2020   HCT 42 08/08/2020   MCV 95  01/02/2020   PLT 198 08/08/2020   Attestation Statements:   Reviewed by clinician on day of visit: allergies, medications, problem list, medical history, surgical history, family history, social history, and previous encounter notes.  I, Water quality scientist, CMA, am acting as transcriptionist for [name], DO  I have reviewed the above documentation for accuracy and completeness, and I agree with the above. -  [name], DO, MS, FAAFP, DABOM - Family and Bariatric Medicine.

## 2021-04-29 ENCOUNTER — Ambulatory Visit (INDEPENDENT_AMBULATORY_CARE_PROVIDER_SITE_OTHER): Payer: Medicare Other | Admitting: Psychology

## 2021-04-29 DIAGNOSIS — F331 Major depressive disorder, recurrent, moderate: Secondary | ICD-10-CM
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
## 2021-05-07 ENCOUNTER — Other Ambulatory Visit (HOSPITAL_COMMUNITY): Payer: Self-pay

## 2021-05-07 MED ORDER — OZEMPIC (1 MG/DOSE) 4 MG/3ML ~~LOC~~ SOPN
1.0000 mg | PEN_INJECTOR | SUBCUTANEOUS | 6 refills | Status: DC
Start: 2021-05-07 — End: 2022-09-21
  Filled 2021-05-07: qty 3, 28d supply, fill #0
  Filled 2021-06-13: qty 3, 28d supply, fill #1
  Filled 2021-07-07: qty 3, 28d supply, fill #2
  Filled 2021-08-11: qty 3, 28d supply, fill #3

## 2021-05-20 ENCOUNTER — Encounter (INDEPENDENT_AMBULATORY_CARE_PROVIDER_SITE_OTHER): Payer: Self-pay | Admitting: Family Medicine

## 2021-05-20 ENCOUNTER — Other Ambulatory Visit: Payer: Self-pay

## 2021-05-20 ENCOUNTER — Ambulatory Visit (INDEPENDENT_AMBULATORY_CARE_PROVIDER_SITE_OTHER): Payer: Medicare Other | Admitting: Family Medicine

## 2021-05-20 VITALS — BP 123/75 | HR 73 | Temp 98.4°F | Ht 69.0 in | Wt 264.0 lb

## 2021-05-20 DIAGNOSIS — E65 Localized adiposity: Secondary | ICD-10-CM | POA: Diagnosis not present

## 2021-05-20 DIAGNOSIS — N1831 Chronic kidney disease, stage 3a: Secondary | ICD-10-CM | POA: Diagnosis not present

## 2021-05-20 DIAGNOSIS — E559 Vitamin D deficiency, unspecified: Secondary | ICD-10-CM

## 2021-05-20 DIAGNOSIS — E669 Obesity, unspecified: Secondary | ICD-10-CM

## 2021-05-20 DIAGNOSIS — E1122 Type 2 diabetes mellitus with diabetic chronic kidney disease: Secondary | ICD-10-CM

## 2021-05-20 DIAGNOSIS — Z6841 Body Mass Index (BMI) 40.0 and over, adult: Secondary | ICD-10-CM

## 2021-05-20 DIAGNOSIS — Z7985 Long-term (current) use of injectable non-insulin antidiabetic drugs: Secondary | ICD-10-CM

## 2021-05-20 DIAGNOSIS — Z6839 Body mass index (BMI) 39.0-39.9, adult: Secondary | ICD-10-CM

## 2021-05-20 MED ORDER — VITAMIN D (ERGOCALCIFEROL) 1.25 MG (50000 UNIT) PO CAPS: ORAL_CAPSULE | ORAL | 0 refills | Status: DC

## 2021-05-21 NOTE — Progress Notes (Signed)
Chief Complaint:   OBESITY Jeanne [name] is here to discuss her progress with her obesity treatment plan along with follow-up of her obesity related diagnoses. See Medical Weight Management Flowsheet for complete bioelectrical impedance results.  Today's visit was #: 49 Starting weight: 281 lbs Starting date: 02/26/2019 Weight change since last visit: +3 lbs Total lbs lost to date: 17 lbs Total weight loss percentage to date: -6.05%  Nutrition Plan: Category 2 Plan for 25% of the time.  Activity: Water aerobics for 90 minutes 3 times per week.  Anti-obesity medications: Ozempic 0.5 mg subcutaneously weekly. Reported side effects: None.  Interim History: Jeanne [name] has been on Ozempic 0.25 mg x2 weeks - getting from [facility].  Plans to ramp up every 2 weeks to get back to 2 mg subcutaneously weekly.  She says she is working on Occupational psychologist.  Exercise is going well.  Assessment/Plan:   1. Type 2 diabetes mellitus with stage 3a chronic kidney disease, without long-term current use of insulin (HCC) Diabetes Mellitus: At goal. Medication: Ozempic 0.25 mg subcutaneously weekly. Issues reviewed: blood sugar goals, complications of diabetes mellitus, hypoglycemia prevention and treatment, exercise, and nutrition.  A1c 6.0.  Plan:  Continue Ozempic. The patient will continue to focus on protein-rich, low simple carbohydrate foods. We reviewed the importance of hydration, regular exercise for stress reduction, and restorative sleep.   Lab Results  Component Value Date   HGBA1C 5.6 08/08/2020   HGBA1C 6.3 (H) 01/02/2020   HGBA1C 6.3 (H) 02/26/2019   Lab Results  Component Value Date   MICROALBUR 7.0 08/15/2020   LDLCALC 95 08/08/2020   CREATININE 1.2 (A) 08/08/2020   2. Vitamin D deficiency Not at goal. She is taking vitamin D 50,000 IU weekly.  Plan: Continue to take prescription Vitamin D @50 ,000 IU every week as prescribed.  Follow-up for routine testing of Vitamin D, at least  2-3 times per year to avoid over-replacement.  Lab Results  Component Value Date   VD25OH 29.7 (L) 01/02/2020   VD25OH 24.2 (L) 02/26/2019   - Refill Vitamin D, Ergocalciferol, (DRISDOL) 1.25 MG (50000 UNIT) CAPS capsule; TAKE 1 CAPSULE BY MOUTH EVERY 7 DAYS  Dispense: 12 capsule; Refill: 0  3. Visceral obesity Current visceral fat rating: 15. Visceral fat rating goal is < 13. Visceral adipose tissue is a hormonally active component of total body fat. This body composition phenotype is associated with medical disorders such as metabolic syndrome, cardiovascular disease, and several malignancies including prostate, breast, and colorectal cancers. Starting goal: Lose 7-10% of starting weight.   4. Obesity BMI today is 39  Course: Jeanne [name] is currently in the action stage of change. As such, her goal is to continue with weight loss efforts.   Nutrition goals: She has agreed to the Category 2 Plan.   Exercise goals:  As is.  Behavioral modification strategies: increasing lean protein intake, decreasing simple carbohydrates, and increasing vegetables.  Jeanne [name] has agreed to follow-up with our clinic in 4 weeks. She was informed of the importance of frequent follow-up visits to maximize her success with intensive lifestyle modifications for her multiple health conditions.   Objective:   Blood pressure 123/75, pulse 73, temperature 98.4 F (36.9 C), temperature source Oral, height 5\' 9"  (1.753 m), weight 264 lb (119.7 kg), SpO2 97 %. Body mass index is 38.99 kg/m.  General: Cooperative, alert, well developed, in no acute distress. HEENT: Conjunctivae and lids unremarkable. Cardiovascular: Regular rhythm.  Lungs: Normal work of breathing. Neurologic: No focal  deficits.   Lab Results  Component Value Date   CREATININE 1.2 (A) 08/08/2020   BUN 12 08/08/2020   NA 135 (A) 08/08/2020   K 4.0 08/08/2020   CL 101 08/08/2020   CO2 23 (A) 08/08/2020   Lab Results  Component Value  Date   ALT 28 08/08/2020   AST 21 08/08/2020   ALKPHOS 105 08/08/2020   BILITOT 0.4 01/02/2020   Lab Results  Component Value Date   HGBA1C 5.6 08/08/2020   HGBA1C 6.3 (H) 01/02/2020   HGBA1C 6.3 (H) 02/26/2019   Lab Results  Component Value Date   INSULIN 21.0 02/26/2019   Lab Results  Component Value Date   TSH 1.13 08/08/2020   Lab Results  Component Value Date   CHOL 177 08/08/2020   HDL 63 08/08/2020   LDLCALC 95 08/08/2020   TRIG 94 08/08/2020   CHOLHDL 2.4 01/02/2020   Lab Results  Component Value Date   VD25OH 29.7 (L) 01/02/2020   VD25OH 24.2 (L) 02/26/2019   Lab Results  Component Value Date   WBC 4.3 08/08/2020   HGB 14.7 08/08/2020   HCT 42 08/08/2020   MCV 95 01/02/2020   PLT 198 08/08/2020   Attestation Statements:   Reviewed by clinician on day of visit: allergies, medications, problem list, medical history, surgical history, family history, social history, and previous encounter notes.  I, Water quality scientist, CMA, am acting as transcriptionist for [name], DO  I have reviewed the above documentation for accuracy and completeness, and I agree with the above. -  [name], DO, MS, FAAFP, DABOM - Family and Bariatric Medicine.

## 2021-05-27 ENCOUNTER — Ambulatory Visit (INDEPENDENT_AMBULATORY_CARE_PROVIDER_SITE_OTHER): Payer: Medicare Other | Admitting: Psychology

## 2021-05-27 DIAGNOSIS — F331 Major depressive disorder, recurrent, moderate: Secondary | ICD-10-CM | POA: Diagnosis not present

## 2021-05-27 NOTE — Progress Notes (Signed)
[facility] Counselor/Therapist Progress Note  Patient ID: Shiesha Niue, MRN: [id number],    Date: 05/27/2021  Time Spent: 2:00pm-2:50pm   50 minutes   Treatment Type: Individual Therapy  Reported Symptoms: sadness, stress  Mental Status Exam: Appearance:  Casual     Behavior: Appropriate  Motor: Normal  Speech/Language:  Normal Rate  Affect: Appropriate  Mood: normal  Thought process: normal  Thought content:   WNL  Sensory/Perceptual disturbances:   WNL  Orientation: oriented to person, place, time/date, and situation  Attention: Good  Concentration: Good  Memory: WNL  Fund of knowledge:  Good  Insight:   Good  Judgment:  Good  Impulse Control: Good   Risk Assessment: Danger to Self:  No Self-injurious Behavior: No Danger to Others: No Duty to Warn:no Physical Aggression / Violence:No  Access to Firearms a concern: No  Gang Involvement:No   Subjective: Pt present for face-to-face in person individual therapy.   Location of pt: office Location of therapist: office.  Pt talked about wanting to get back with her healthy weight and wellness program and she is exercising more.  Pt's spirituality and faith in God also supports her wellness.    Pt talked about having issues with conflict avoidance.   She has several conversations with people that she needs to have but is avoiding it bc of feeling dread and anxiety about the potential interactions.   Pt's 20 yo nephew has been living with her for 4 years and does not pay rent or contribute in any way.  She wants him to clean the bathroom but has been hesitant to talk with him bc he can "give attitude" at times.  Worked with pt on how to communicate effectively with her nephew and advocate for what she needs.  Pt also needs to talk with her sister who is the mother of pt's nephew.  Pt also needs to have a talk with her pastor who said something that hurt pt's feelings.  Helped pt process her feelings and helped  her problem solve how to approach her pastor to have a conversation about their interaction.        Encouraged pt to engage in self care.    Provided supportive therapy.   Interventions: Cognitive Behavioral Therapy and Insight-Oriented  Diagnosis: F33.1  Plan: See pt's Treatment Plan for depression in Therapy Charts.  (Treatment Plan Target Date: 09/17/2021) Pt is progressing toward treatment goals.   Plan to continue to see pt monthly.    Jaxon Flatt, LCSW                  Akasia Ahmad [place], LCSW

## 2021-06-04 ENCOUNTER — Encounter (HOSPITAL_COMMUNITY): Payer: Self-pay | Admitting: Psychiatry

## 2021-06-04 ENCOUNTER — Other Ambulatory Visit: Payer: Self-pay

## 2021-06-04 ENCOUNTER — Telehealth (HOSPITAL_BASED_OUTPATIENT_CLINIC_OR_DEPARTMENT_OTHER): Payer: Medicare Other | Admitting: Psychiatry

## 2021-06-04 DIAGNOSIS — F251 Schizoaffective disorder, depressive type: Secondary | ICD-10-CM | POA: Diagnosis not present

## 2021-06-04 DIAGNOSIS — F429 Obsessive-compulsive disorder, unspecified: Secondary | ICD-10-CM

## 2021-06-04 MED ORDER — ARIPIPRAZOLE 30 MG PO TABS: 30.0000 mg | ORAL_TABLET | Freq: Every day | ORAL | 1 refills | Status: DC

## 2021-06-04 MED ORDER — BUPROPION HCL ER (XL) 150 MG PO TB24: 450.0000 mg | ORAL_TABLET | Freq: Every day | ORAL | 1 refills | Status: DC

## 2021-06-04 MED ORDER — QUETIAPINE FUMARATE 300 MG PO TABS: 600.0000 mg | ORAL_TABLET | Freq: Every day | ORAL | 1 refills | Status: DC

## 2021-06-04 MED ORDER — DULOXETINE HCL 30 MG PO CPEP: 90.0000 mg | ORAL_CAPSULE | Freq: Every day | ORAL | 1 refills | Status: DC

## 2021-06-04 NOTE — Progress Notes (Signed)
Virtual Visit via Video Note  I connected with Jeanne [name] on 06/04/21 at  9:00 AM EST by a video enabled telemedicine application and verified that I am speaking with the correct person using two identifiers.  Location: Patient: home Provider: office   I discussed the limitations of evaluation and management by telemedicine and the availability of in person appointments. The patient expressed understanding and agreed to proceed.  History of Present Illness: Jeanne [name] shares that she is doing well. She continues to be active and social. She swims 3-4x/week and spends time with her family. She is engaging in her hobbies of writing. [name] is working on a weight loss and it has been a struggle. She is working with [employer] [facility] for weight loss. She denies depression. She denies anhedonia. Her sleep is good. She denies SI/HI. Jeanne [name] denies AVH, paranoia, magical thinking or ideas of reference. Her meds remain effect and she denies SE.    Observations/Objective: Psychiatric Specialty Exam: ROS  There were no vitals taken for this visit.There is no height or weight on file to calculate BMI.  General Appearance: Neat and Well Groomed  Eye Contact:  Good  Speech:  Clear and Coherent and Normal Rate  Volume:  Normal  Mood:  Euthymic  Affect:  Full Range  Thought Process:  Goal Directed, Linear, and Descriptions of Associations: Intact  Orientation:  Full (Time, Place, and Person)  Thought Content:  Logical  Suicidal Thoughts:  No  Homicidal Thoughts:  No  Memory:  Immediate;   Good  Judgement:  Good  Insight:  Good  Psychomotor Activity:  Normal  Concentration:  Concentration: Good  Recall:  Good  Fund of Knowledge:  Good  Language:  Good  Akathisia:  No  Handed:  Right  AIMS (if indicated):     Assets:  Communication Skills Desire for Improvement Financial Resources/Insurance Housing Resilience Social  Support Talents/Skills Transportation Vocational/Educational  ADL's:  Intact  Cognition:  WNL  Sleep:        Assessment and Plan: Depression screen [facility] 2/9 06/04/2021 01/15/2021 10/16/2020 07/24/2020 06/19/2020  Decreased Interest 0 0 0 0 2  Down, Depressed, Hopeless 0 0 0 1 3  PHQ - 2 Score 0 0 0 1 5  Altered sleeping - - - 0 0  Tired, decreased energy - - - 0 3  Change in appetite - - - 0 2  Feeling bad or failure about yourself  - - - 0 0  Trouble concentrating - - - 0 0  Moving slowly or fidgety/restless - - - 0 0  Suicidal thoughts - - - 0 0  PHQ-9 Score - - - 1 10  Difficult doing work/chores - - - Not difficult at all Somewhat difficult    Flowsheet Row Video Visit from 06/04/2021 in [facility] Video Visit from 01/15/2021 in [facility] Video Visit from 10/16/2020 in [facility] No Risk No Risk No Risk       - she will scheduled an appointment for an EKG with her PCP  - Dartha will send in copies of her recent lab work done in Jan 2023  1. Schizoaffective disorder, depressive type (HCC) - ARIPiprazole (ABILIFY) 30 MG tablet; Take 1 tablet (30 mg total) by mouth daily.  Dispense: 90 tablet; Refill: 1 - buPROPion (WELLBUTRIN XL) 150 MG 24 hr tablet; Take 3 tablets (450 mg total) by mouth daily.  Dispense: 270  tablet; Refill: 1 - DULoxetine (CYMBALTA) 30 MG capsule; Take 3 capsules (90 mg total) by mouth daily.  Dispense: 270 capsule; Refill: 1 - QUEtiapine (SEROQUEL) 300 MG tablet; Take 2 tablets (600 mg total) by mouth at bedtime.  Dispense: 180 tablet; Refill: 1  2. Obsessive-compulsive disorder, unspecified type - DULoxetine (CYMBALTA) 30 MG capsule; Take 3 capsules (90 mg total) by mouth daily.  Dispense: 270 capsule; Refill: 1    Follow Up Instructions: In 5-6 months or sooner if needed   I discussed the assessment and  treatment plan with the patient. The patient was provided an opportunity to ask questions and all were answered. The patient agreed with the plan and demonstrated an understanding of the instructions.   The patient was advised to call back or seek an in-person evaluation if the symptoms worsen or if the condition fails to improve as anticipated.  I provided 13 minutes of non-face-to-face time during this encounter.   Jeanne Cradle, MD

## 2021-06-12 ENCOUNTER — Other Ambulatory Visit: Payer: Self-pay | Admitting: Internal Medicine

## 2021-06-12 DIAGNOSIS — N179 Acute kidney failure, unspecified: Secondary | ICD-10-CM

## 2021-06-12 DIAGNOSIS — M359 Systemic involvement of connective tissue, unspecified: Secondary | ICD-10-CM

## 2021-06-12 DIAGNOSIS — Z6841 Body Mass Index (BMI) 40.0 and over, adult: Secondary | ICD-10-CM

## 2021-06-12 DIAGNOSIS — I10 Essential (primary) hypertension: Secondary | ICD-10-CM

## 2021-06-12 DIAGNOSIS — N183 Chronic kidney disease, stage 3 unspecified: Secondary | ICD-10-CM

## 2021-06-15 ENCOUNTER — Other Ambulatory Visit (HOSPITAL_COMMUNITY): Payer: Self-pay

## 2021-06-17 ENCOUNTER — Other Ambulatory Visit: Payer: Self-pay

## 2021-06-17 ENCOUNTER — Encounter (INDEPENDENT_AMBULATORY_CARE_PROVIDER_SITE_OTHER): Payer: Self-pay | Admitting: Adult Health

## 2021-06-17 ENCOUNTER — Ambulatory Visit (INDEPENDENT_AMBULATORY_CARE_PROVIDER_SITE_OTHER): Payer: Medicare Other | Admitting: Adult Health

## 2021-06-17 VITALS — BP 125/79 | HR 74 | Temp 98.8°F | Ht 69.0 in | Wt 258.0 lb

## 2021-06-17 DIAGNOSIS — Z6838 Body mass index (BMI) 38.0-38.9, adult: Secondary | ICD-10-CM

## 2021-06-17 DIAGNOSIS — E1122 Type 2 diabetes mellitus with diabetic chronic kidney disease: Secondary | ICD-10-CM | POA: Diagnosis not present

## 2021-06-17 DIAGNOSIS — E669 Obesity, unspecified: Secondary | ICD-10-CM | POA: Diagnosis not present

## 2021-06-17 DIAGNOSIS — Z7985 Long-term (current) use of injectable non-insulin antidiabetic drugs: Secondary | ICD-10-CM

## 2021-06-17 DIAGNOSIS — Z720 Tobacco use: Secondary | ICD-10-CM

## 2021-06-17 DIAGNOSIS — N1831 Chronic kidney disease, stage 3a: Secondary | ICD-10-CM | POA: Diagnosis not present

## 2021-06-17 NOTE — Progress Notes (Signed)
? ? ? ?Chief Complaint:  ? ?OBESITY ?Jeanne [name] is here to discuss her progress with her obesity treatment plan along with follow-up of her obesity related diagnoses. Jeanne [name] is on the Category 2 Plan and states she is following her eating plan approximately 70% of the time. Jeanne [name] states she is doing water aerobics at the [facility] and cardio for 60 minutes 5 times per week. ? ?Today's visit was #: 36 ?Starting weight: 281 lbs ?Starting date: 02/26/2019 ?Today's weight: 258 lbs ?Today's date: 06/17/2021 ?Total lbs lost to date: 23 lbs ?Total lbs lost since last in-office visit: 6 lbs ? ?Interim History:  ?Jeanne [name] is followed by her PCP every 6 months.   ? ?She completed therapy with Dr. Mallie Mussel in December 2022. ? ?2023 Health Goals:   ?1) Continue to exercise. ?2) Do not set a goal weight. ?3) Continue to eat healthy. ? ?Subjective:  ? ?1. Type 2 diabetes mellitus with stage 3a chronic kidney disease, without long-term current use of insulin ([place]) ?In 02/2021, A1c 5.8 with PCP. ?Fasting BG 95-102, no symptoms of hypoglycemia. ?She is titrating Ozempic back up to 2 mg- tolerating GLP-1 therapy without SE. ? ?2. Tobacco use ?Currently 1/2 pack per day. ?30 years of tobacco use - highest use - 1 pack per day. ?On 01/27/2021, Chantix - caused bad taste which resulted with increased tobacco use. ? ?Assessment/Plan:  ? ?1. Type 2 diabetes mellitus with stage 3a chronic kidney disease, without long-term current use of insulin ([place]) ?Continue Ozempic titration. ?At least 4 doses at each strength before increasing- she verbalized understanding and agreement. ? ?2. Tobacco use ?Discuss tobacco cessation at next office visit- Nicoderm patch. ? ?3. Obesity BMI today is 38.2 ? ?Jeanne [name] is currently in the action stage of change. As such, her goal is to continue with weight loss efforts. She has agreed to the Category 2 Plan.  ? ?Exercise goals:  As is. ? ?Behavioral modification strategies: increasing lean protein intake,  decreasing simple carbohydrates, meal planning and cooking strategies, keeping healthy foods in the home, and planning for success. ? ?Jeanne [name] has agreed to follow-up with our clinic in 4 weeks. She was informed of the importance of frequent follow-up visits to maximize her success with intensive lifestyle modifications for her multiple health conditions.  ? ?Objective:  ? ?Blood pressure 125/79, pulse 74, temperature 98.8 ?F (37.1 ?C), height 5\' 9"  (1.753 m), weight 258 lb (117 kg), SpO2 100 %. ?Body mass index is 38.1 kg/m?. ? ?General: Cooperative, alert, well developed, in no acute distress. ?HEENT: Conjunctivae and lids unremarkable. ?Cardiovascular: Regular rhythm.  ?Lungs: Normal work of breathing. ?Neurologic: No focal deficits.  ? ?Lab Results  ?Component Value Date  ? CREATININE 1.2 (A) 08/08/2020  ? BUN 12 08/08/2020  ? NA 135 (A) 08/08/2020  ? K 4.0 08/08/2020  ? CL 101 08/08/2020  ? CO2 23 (A) 08/08/2020  ? ?Lab Results  ?Component Value Date  ? ALT 28 08/08/2020  ? AST 21 08/08/2020  ? ALKPHOS 105 08/08/2020  ? BILITOT 0.4 01/02/2020  ? ?Lab Results  ?Component Value Date  ? HGBA1C 5.6 08/08/2020  ? HGBA1C 6.3 (H) 01/02/2020  ? HGBA1C 6.3 (H) 02/26/2019  ? ?Lab Results  ?Component Value Date  ? INSULIN 21.0 02/26/2019  ? ?Lab Results  ?Component Value Date  ? TSH 1.13 08/08/2020  ? ?Lab Results  ?Component Value Date  ? CHOL 177 08/08/2020  ? HDL 63 08/08/2020  ? [place] 95 08/08/2020  ? TRIG 94  08/08/2020  ? CHOLHDL 2.4 01/02/2020  ? ?Lab Results  ?Component Value Date  ? VD25OH 29.7 (L) 01/02/2020  ? VD25OH 24.2 (L) 02/26/2019  ? ?Lab Results  ?Component Value Date  ? WBC 4.3 08/08/2020  ? HGB 14.7 08/08/2020  ? HCT 42 08/08/2020  ? MCV 95 01/02/2020  ? PLT 198 08/08/2020  ? ?Attestation Statements:  ? ?Reviewed by clinician on day of visit: allergies, medications, problem list, medical history, surgical history, family history, social history, and previous encounter notes. ? ?Time spent on visit  including pre-visit chart review and post-visit care and charting was 27 minutes.  ? ?I, Water quality scientist, CMA, am acting as Location manager for Mina Marble, NP. ? ?I have reviewed the above documentation for accuracy and completeness, and I agree with the above. - Gennett Garcia d. Mega Kinkade, NP-C ?

## 2021-06-18 DIAGNOSIS — Z720 Tobacco use: Secondary | ICD-10-CM | POA: Insufficient documentation

## 2021-06-18 DIAGNOSIS — F17200 Nicotine dependence, unspecified, uncomplicated: Secondary | ICD-10-CM | POA: Insufficient documentation

## 2021-06-24 ENCOUNTER — Ambulatory Visit (INDEPENDENT_AMBULATORY_CARE_PROVIDER_SITE_OTHER): Payer: Medicare Other | Admitting: Psychology

## 2021-06-24 DIAGNOSIS — F331 Major depressive disorder, recurrent, moderate: Secondary | ICD-10-CM | POA: Diagnosis not present

## 2021-06-24 NOTE — Progress Notes (Signed)
[facility] Counselor/Therapist Progress Note ? ?Patient ID: Jeanne [name], MRN: [id number],   ? ?Date: 06/24/2021 ? ?Time Spent: 12:00pm-12:50pm   50 minutes  ? ?Treatment Type: Individual Therapy ? ?Reported Symptoms: stress ? ?Mental Status Exam: ?Appearance:  Casual     ?Behavior: Appropriate  ?Motor: Normal  ?Speech/Language:  Normal Rate  ?Affect: Appropriate  ?Mood: normal  ?Thought process: normal  ?Thought content:   WNL  ?Sensory/Perceptual disturbances:   WNL  ?Orientation: oriented to person, place, time/date, and situation  ?Attention: Good  ?Concentration: Good  ?Memory: WNL  ?Fund of knowledge:  Good  ?Insight:   Good  ?Judgment:  Good  ?Impulse Control: Good  ? ?Risk Assessment: ?Danger to Self:  No ?Self-injurious Behavior: No ?Danger to Others: No ?Duty to Warn:no ?Physical Aggression / Violence:No  ?Access to Firearms a concern: No  ?Gang Involvement:No  ? ?Subjective: Pt present for face-to-face in person individual therapy.   ?Location of pt: office ?Location of therapist: office.  ?Pt states that overall she has had a good month and has been busy with church activities.   ?Pt states she has not had the conversations with her nephew and sister bc she was being conflict avoidant.  Addressed pt's issues with conflict avoidance. ?Pt will be in [place] May 3rd - July 12th for a [employer].  Pt is excited and apprehesive about the trip.  It will be 10 weeks wehre she will not have control over many aspects of her life.    Helped pt problem solve travel issues.   ?Pt is scheduled to to do a presentation for a [employer] group next Monday.  Pt feels comfortable with doing public speaking. ?Pt is planning to go to [place] with her sister in December.     ?Pt has gotten back on track with her weight loss efforts and has lost 8 lbs.  She has been exercising at the pool 4 days a week.  ?Encouraged pt to continue to engage in self care.    ?Provided supportive therapy.  ? ?Interventions: Cognitive  Behavioral Therapy and Insight-Oriented ? ?Diagnosis: F33.1 ? ?Plan: See pt's Treatment Plan for depression in Therapy Charts.  (Treatment Plan Target Date: 09/17/2021) ?Pt is progressing toward treatment goals.   ?Plan to continue to see pt monthly.   ? ?Jeanne Burkley, LCSW ? ? ?

## 2021-06-25 ENCOUNTER — Ambulatory Visit
Admission: RE | Admit: 2021-06-25 | Discharge: 2021-06-25 | Disposition: A | Discharge status: Home / Self Care | Payer: Medicare Other | Source: Ambulatory Visit | Attending: Internal Medicine | Admitting: Internal Medicine

## 2021-06-25 DIAGNOSIS — I10 Essential (primary) hypertension: Secondary | ICD-10-CM

## 2021-06-25 DIAGNOSIS — N179 Acute kidney failure, unspecified: Secondary | ICD-10-CM

## 2021-06-25 DIAGNOSIS — Z6841 Body Mass Index (BMI) 40.0 and over, adult: Secondary | ICD-10-CM

## 2021-06-25 DIAGNOSIS — M359 Systemic involvement of connective tissue, unspecified: Secondary | ICD-10-CM

## 2021-06-25 DIAGNOSIS — N183 Chronic kidney disease, stage 3 unspecified: Secondary | ICD-10-CM

## 2021-07-07 ENCOUNTER — Other Ambulatory Visit (HOSPITAL_COMMUNITY): Payer: Self-pay

## 2021-07-15 ENCOUNTER — Ambulatory Visit (INDEPENDENT_AMBULATORY_CARE_PROVIDER_SITE_OTHER): Payer: Medicare Other | Admitting: Family Medicine

## 2021-07-15 ENCOUNTER — Other Ambulatory Visit: Payer: Self-pay

## 2021-07-15 ENCOUNTER — Encounter (INDEPENDENT_AMBULATORY_CARE_PROVIDER_SITE_OTHER): Payer: Self-pay | Admitting: Family Medicine

## 2021-07-15 VITALS — BP 111/68 | HR 64 | Temp 98.3°F | Ht 69.0 in | Wt 259.0 lb

## 2021-07-15 DIAGNOSIS — K219 Gastro-esophageal reflux disease without esophagitis: Secondary | ICD-10-CM

## 2021-07-15 DIAGNOSIS — E1169 Type 2 diabetes mellitus with other specified complication: Secondary | ICD-10-CM

## 2021-07-15 DIAGNOSIS — E559 Vitamin D deficiency, unspecified: Secondary | ICD-10-CM

## 2021-07-15 DIAGNOSIS — E669 Obesity, unspecified: Secondary | ICD-10-CM | POA: Diagnosis not present

## 2021-07-15 DIAGNOSIS — Z7985 Long-term (current) use of injectable non-insulin antidiabetic drugs: Secondary | ICD-10-CM

## 2021-07-15 DIAGNOSIS — Z6838 Body mass index (BMI) 38.0-38.9, adult: Secondary | ICD-10-CM

## 2021-07-15 DIAGNOSIS — Z794 Long term (current) use of insulin: Secondary | ICD-10-CM

## 2021-07-20 NOTE — Progress Notes (Signed)
Chief Complaint:   OBESITY Jeanne [name] is here to discuss her progress with her obesity treatment plan along with follow-up of her obesity related diagnoses.   Today's visit was #: 75 Starting weight: 281 lbs Starting date: 02/26/2019 Today's weight: 259 lbs Today's date: 07/15/2021 Weight change since last visit: +1 lb Total lbs lost to date: 22 lbs Body mass index is 38.25 kg/m.  Total weight loss percentage to date: -7.83%  Current Meal Plan: the Category 1 Plan for 43% of the time.  Current Exercise Plan: Water aerobics for 60 minutes 4 times per week. Current Anti-Obesity Medications: Ozempic 1 mg subcutaneously weekly. Side effects: None.  Interim History: Jeanne [name] reports that she will have a fellowship for 10 weeks, May 3-July 8. She is tolerating the Ozempic 1 mg with no side effects. She will look for a local pool when in [place].  Assessment/Plan:   1. Vitamin D deficiency Not at goal.   Plan: Follow-up for routine testing of Vitamin D, at least 2-3 times per year to avoid over-replacement. Okay to hold off on taking and we will recheck labs when back from trip.  Lab Results  Component Value Date   VD25OH 29.7 (L) 01/02/2020   VD25OH 24.2 (L) 02/26/2019   2. Type 2 diabetes mellitus with other specified complication, with long-term current use of insulin (HCC) Diabetes Mellitus: Improving, but not optimized. Medication: Ozempic 1 mg subcutaneously weekly. Issues reviewed: blood sugar goals, complications of diabetes mellitus, hypoglycemia prevention and treatment, exercise, and nutrition.   Plan: The patient will continue to focus on protein-rich, low simple carbohydrate foods. We reviewed the importance of hydration, regular exercise for stress reduction, and restorative sleep.   Lab Results  Component Value Date   HGBA1C 5.6 08/08/2020   HGBA1C 6.3 (H) 01/02/2020   HGBA1C 6.3 (H) 02/26/2019    Lab Results  Component Value Date   MICROALBUR 7.0 08/15/2020   LDLCALC 95 08/08/2020   CREATININE 1.2 (A) 08/08/2020   3. Gastroesophageal reflux disease without esophagitis We reviewed the diagnosis of GERD and importance of treatment. We discussed "red flag" symptoms and the importance of follow up if symptoms persisted despite treatment. We reviewed non-pharmacologic management of GERD symptoms: including: caffeine reduction, dietary changes, elevate HOB, NPO after supper, reduction of alcohol intake, tobacco cessation, and weight loss.  4. Obesity BMI today is 38.3 Course: Jeanne [name] is currently in the action stage of change. As such, her goal is to continue with weight loss efforts.   Nutrition goals: She has agreed to the Category 1 Plan.   Exercise goals: As is.  Behavioral modification strategies: increasing lean protein intake, decreasing simple carbohydrates, increasing vegetables, and increasing water intake.  Jeanne [name] has agreed to follow-up with our clinic in 2 weeks. She was informed of the importance of frequent follow-up visits to maximize her success with intensive lifestyle modifications for her multiple health conditions.   Objective:   Blood pressure 111/68, pulse 64, temperature 98.3 F (36.8 C), temperature source Oral, height '5\' 9"'$  (1.753 m), weight 259 lb (117.5 kg), SpO2 100 %. Body mass index is 38.25 kg/m.  General: Cooperative, alert, well developed, in  no acute distress. HEENT: Conjunctivae and lids unremarkable. Cardiovascular: Regular rhythm.  Lungs: Normal work of breathing. Neurologic: No focal deficits.   Lab Results  Component Value Date   CREATININE 1.2 (A) 08/08/2020   BUN 12 08/08/2020   NA 135 (A) 08/08/2020   K 4.0 08/08/2020   CL 101 08/08/2020   CO2 23 (A) 08/08/2020   Lab Results  Component Value Date   ALT 28 08/08/2020   AST 21 08/08/2020   ALKPHOS 105 08/08/2020   BILITOT 0.4 01/02/2020   Lab Results  Component  Value Date   HGBA1C 5.6 08/08/2020   HGBA1C 6.3 (H) 01/02/2020   HGBA1C 6.3 (H) 02/26/2019   Lab Results  Component Value Date   INSULIN 21.0 02/26/2019   Lab Results  Component Value Date   TSH 1.13 08/08/2020   Lab Results  Component Value Date   CHOL 177 08/08/2020   HDL 63 08/08/2020   LDLCALC 95 08/08/2020   TRIG 94 08/08/2020   CHOLHDL 2.4 01/02/2020   Lab Results  Component Value Date   VD25OH 29.7 (L) 01/02/2020   VD25OH 24.2 (L) 02/26/2019   Lab Results  Component Value Date   WBC 4.3 08/08/2020   HGB 14.7 08/08/2020   HCT 42 08/08/2020   MCV 95 01/02/2020   PLT 198 08/08/2020   Attestation Statements:   Reviewed by clinician on day of visit: allergies, medications, problem list, medical history, surgical history, family history, social history, and previous encounter notes.  Leodis Binet Friedenbach, CMA, am acting as Location manager for [employer], DO.  I have reviewed the above documentation for accuracy and completeness, and I agree with the above. -  [name], DO, MS, FAAFP, DABOM - Family and Bariatric Medicine.

## 2021-07-29 ENCOUNTER — Ambulatory Visit (INDEPENDENT_AMBULATORY_CARE_PROVIDER_SITE_OTHER): Payer: Medicare Other | Admitting: Psychology

## 2021-07-29 ENCOUNTER — Telehealth (INDEPENDENT_AMBULATORY_CARE_PROVIDER_SITE_OTHER): Payer: Medicare Other | Admitting: Family Medicine

## 2021-07-29 ENCOUNTER — Encounter (INDEPENDENT_AMBULATORY_CARE_PROVIDER_SITE_OTHER): Payer: Self-pay | Admitting: Family Medicine

## 2021-07-29 DIAGNOSIS — Z6838 Body mass index (BMI) 38.0-38.9, adult: Secondary | ICD-10-CM | POA: Diagnosis not present

## 2021-07-29 DIAGNOSIS — M069 Rheumatoid arthritis, unspecified: Secondary | ICD-10-CM | POA: Diagnosis not present

## 2021-07-29 DIAGNOSIS — E1169 Type 2 diabetes mellitus with other specified complication: Secondary | ICD-10-CM | POA: Diagnosis not present

## 2021-07-29 DIAGNOSIS — Z794 Long term (current) use of insulin: Secondary | ICD-10-CM

## 2021-07-29 DIAGNOSIS — E669 Obesity, unspecified: Secondary | ICD-10-CM

## 2021-07-29 DIAGNOSIS — F331 Major depressive disorder, recurrent, moderate: Secondary | ICD-10-CM

## 2021-07-29 NOTE — Progress Notes (Signed)
TeleHealth Visit:  Due to the COVID-19 pandemic, this visit was completed with telemedicine (audio/video) technology to reduce patient and provider exposure as well as to preserve personal protective equipment.   [name] has verbally consented to this TeleHealth visit. The patient is located at home, the provider is located at home. The participants in this visit include the listed provider and patient. The visit was conducted today via MyChart video.  OBESITY Jeanne [name] is here to discuss her progress with her obesity treatment plan along with follow-up of her obesity related diagnoses.   Today's visit was #: 10 Starting weight: 281 lbs Starting date: 02/26/2019 Today's date: 07/29/2021  Does not weight at home.   Nutrition Plan: the Category 2 Plan for 50% of the time.  Anti-obesity medications: Ozempic 1 mg subcutaneously weekly. Reported side effects: None. Hunger is moderately controlled. Cravings are moderately controlled.  Activity: Water aerobics for 60 minutes 5 times per week.  Assessment/Plan:   Diagnoses and all orders for this visit:  Type 2 diabetes mellitus with other specified complication, with long-term current use of insulin ([place]) Diabetes Mellitus: At goal. Issues reviewed: blood sugar goals, complications of diabetes mellitus, hypoglycemia prevention and treatment, exercise, and nutrition.  Plan: The patient will continue to focus on protein-rich, low simple carbohydrate foods. We reviewed the importance of hydration, regular exercise for stress reduction, and restorative sleep.   Lab Results  Component Value Date   HGBA1C 5.6 08/08/2020   HGBA1C 6.3 (H) 01/02/2020   HGBA1C 6.3 (H) 02/26/2019   Lab Results  Component Value Date   MICROALBUR 7.0 08/15/2020   [place] 95 08/08/2020   CREATININE 1.2 (A) 08/08/2020   Rheumatoid arthritis ([place]) She will find a local pool while on her trip. We will continue to monitor symptoms as they relate to her weight  loss journey.  Obesity BMI today is 38.3 [name] is currently in the action stage of change. As such, her goal is to continue with weight loss efforts. She has agreed to practicing portion control and making smarter food choices, such as increasing vegetables and decreasing simple carbohydrates.   Exercise goals: For substantial health benefits, adults should do at least 150 minutes (2 hours and 30 minutes) a week of moderate-intensity, or 75 minutes (1 hour and 15 minutes) a week of vigorous-intensity aerobic physical activity, or an equivalent combination of moderate- and vigorous-intensity aerobic activity. Aerobic activity should be performed in episodes of at least 10 minutes, and preferably, it should be spread throughout the week.  Behavioral modification strategies: increasing lean protein intake, decreasing simple carbohydrates, increasing vegetables, and increasing water intake.  Breunna has agreed to follow-up with our clinic in 12 weeks. She was informed of the importance of frequent follow-up visits to maximize her success with intensive lifestyle modifications for her multiple health conditions.  Objective:   VITALS: Per patient if applicable, see vitals. GENERAL: Alert and in no acute distress. CARDIOPULMONARY: No increased WOB. Speaking in clear sentences.  PSYCH: Pleasant and cooperative. Speech normal rate and rhythm. Affect is appropriate. Insight and judgement are appropriate. Attention is focused, linear, and appropriate.  NEURO: Oriented as arrived to appointment on time with no prompting.   Lab Results  Component Value Date   CREATININE 1.2 (A) 08/08/2020   BUN 12 08/08/2020   NA 135 (A) 08/08/2020   K 4.0 08/08/2020   CL 101 08/08/2020   CO2 23 (A) 08/08/2020   Lab Results  Component Value Date   ALT 28 08/08/2020  AST 21 08/08/2020   ALKPHOS 105 08/08/2020   BILITOT 0.4 01/02/2020   Lab Results  Component Value Date   HGBA1C 5.6 08/08/2020   HGBA1C  6.3 (H) 01/02/2020   HGBA1C 6.3 (H) 02/26/2019   Lab Results  Component Value Date   INSULIN 21.0 02/26/2019   Lab Results  Component Value Date   TSH 1.13 08/08/2020   Lab Results  Component Value Date   CHOL 177 08/08/2020   HDL 63 08/08/2020   LDLCALC 95 08/08/2020   TRIG 94 08/08/2020   CHOLHDL 2.4 01/02/2020   Lab Results  Component Value Date   WBC 4.3 08/08/2020   HGB 14.7 08/08/2020   HCT 42 08/08/2020   MCV 95 01/02/2020   PLT 198 08/08/2020   Attestation Statements:   Reviewed by clinician on day of visit: allergies, medications, problem list, medical history, surgical history, family history, social history, and previous encounter notes.

## 2021-07-29 NOTE — Progress Notes (Signed)
[facility] Counselor/Therapist Progress Note ? ?Patient ID: Jeanne [name], MRN: [id number],   ? ?Date: 07/29/2021 ? ?Time Spent: 12:00pm-12:45pm   45 minutes  ? ?Treatment Type: Individual Therapy ? ?Reported Symptoms: stress ? ?Mental Status Exam: ?Appearance:  Casual     ?Behavior: Appropriate  ?Motor: Normal  ?Speech/Language:  Normal Rate  ?Affect: Appropriate  ?Mood: normal  ?Thought process: normal  ?Thought content:   WNL  ?Sensory/Perceptual disturbances:   WNL  ?Orientation: oriented to person, place, time/date, and situation  ?Attention: Good  ?Concentration: Good  ?Memory: WNL  ?Fund of knowledge:  Good  ?Insight:   Good  ?Judgment:  Good  ?Impulse Control: Good  ? ?Risk Assessment: ?Danger to Self:  No ?Self-injurious Behavior: No ?Danger to Others: No ?Duty to Warn:no ?Physical Aggression / Violence:No  ?Access to Firearms a concern: No  ?Gang Involvement:No  ? ?Subjective: Pt present for face-to-face in person individual therapy.   ?Location of pt: office ?Location of therapist: office.  ?Pt talked about feeling anxious about her trip to the [employer] she will be attending for 10 weeks.  Pt leaves for her trip on May 3rd. Addressed pt's concerns and anxious thoughts.   Worked on thought reframing and calming strategies.  ?Encouraged pt to continue to engage in self care.    ?Provided supportive therapy.  ? ?Interventions: Cognitive Behavioral Therapy and Insight-Oriented ? ?Diagnosis: F33.1 ? ?Plan: See pt's Treatment Plan for depression in Therapy Charts.  (Treatment Plan Target Date: 09/17/2021) ?Pt is progressing toward treatment goals.   ?Plan to continue to see pt monthly.   ? ?Ryann Leavitt, LCSW ? ? ?

## 2021-08-11 ENCOUNTER — Other Ambulatory Visit (HOSPITAL_COMMUNITY): Payer: Self-pay

## 2021-08-27 ENCOUNTER — Ambulatory Visit (INDEPENDENT_AMBULATORY_CARE_PROVIDER_SITE_OTHER): Payer: Medicare Other | Admitting: Psychology

## 2021-08-27 DIAGNOSIS — F331 Major depressive disorder, recurrent, moderate: Secondary | ICD-10-CM

## 2021-08-27 NOTE — Progress Notes (Signed)
[facility] Counselor/Therapist Progress Note ? ?Patient ID: Jeanne [name], MRN: [id number],   ? ?Date: 08/27/2021 ? ?Time Spent: 3:00pm-3:45pm   45 minutes  ? ?Treatment Type: Individual Therapy ? ?Reported Symptoms: stress ? ?Mental Status Exam: ?Appearance:  Casual     ?Behavior: Appropriate  ?Motor: Normal  ?Speech/Language:  Normal Rate  ?Affect: Appropriate  ?Mood: normal  ?Thought process: normal  ?Thought content:   WNL  ?Sensory/Perceptual disturbances:   WNL  ?Orientation: oriented to person, place, time/date, and situation  ?Attention: Good  ?Concentration: Good  ?Memory: WNL  ?Fund of knowledge:  Good  ?Insight:   Good  ?Judgment:  Good  ?Impulse Control: Good  ? ?Risk Assessment: ?Danger to Self:  No ?Self-injurious Behavior: No ?Danger to Others: No ?Duty to Warn:no ?Physical Aggression / Violence:No  ?Access to Firearms a concern: No  ?Gang Involvement:No  ? ?Subjective: Pt present for face-to-face individual therapy via Webex.  Pt consents to telehealth video session.   ?Location of pt: office ?Location of therapist: office.  ?Pt talked about being at her [employer] retreat for a week.  She states it is going well so far.   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  She will sing a spiritual song as well.    ?Pt is living in a house with another Probation officer and she has her own bedroom and bathroom.   ?Pt is trying to work out exercising at the [facility] later this month.   ?Pt states her mood is stable.   ?Encouraged pt to continue to engage in self care.    ?Provided supportive therapy.  ? ?Interventions: Cognitive Behavioral Therapy and Insight-Oriented ? ?Diagnosis: F33.1 ? ?Plan: See pt's Treatment Plan for depression in Therapy Charts.  (Treatment Plan Target Date: 09/17/2021) ?Pt is progressing toward treatment goals.   ?Plan to continue to see pt monthly.   ? ?[name], LCSW ? ? ? ?

## 2021-09-17 ENCOUNTER — Other Ambulatory Visit (HOSPITAL_COMMUNITY): Payer: Self-pay

## 2021-09-22 ENCOUNTER — Ambulatory Visit (INDEPENDENT_AMBULATORY_CARE_PROVIDER_SITE_OTHER): Payer: Medicare Other | Admitting: Psychology

## 2021-09-22 DIAGNOSIS — F331 Major depressive disorder, recurrent, moderate: Secondary | ICD-10-CM | POA: Diagnosis not present

## 2021-09-22 NOTE — Progress Notes (Signed)
[facility] Counselor Initial Adult Exam  Name: Tomeko Niue Date: 09/22/2021 MRN: [id number] DOB: 1960-10-25 PCP: Velna Hatchet, MD  Time spent: 3:00pm - 3:50pm   50 minutes  Guardian/Payee:  n/a    Paperwork requested: No   Reason for Visit /Presenting Problem: Pt present for face-to-face initial assessment update via video Webex.  Pt consents to telehealth video session due to COVID 19 pandemic. Location of pt: home Location of therapist: home office.  Pt continues to have the goal to maintain mental health stability.   Pt states she tends to "get lost in her head at times".  Pt states she "knows how bad she can get" and wants to avoid that.  Pt has significant psychiatric history.   When pt starts to get depressed she tends to sleep and eat more.  Her affect becomes more blunt.  Pt has not had hallucinations in many years.  Pt states when her "OCD ramps up" and she tends to ruminate more.   Pt talked about having two "anxiety attacks" the past few weeks.   Before that she had not had an anxiety attack since the 1990's.   Addressed what triggered pt's anxiety.  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  Worked with pt on calming strategies.  Reviewed pt's treatment plan for annual update.   Pt participated in setting treatment goals.  Pt's goal is to maintain stability in her mental health.  Plan to continue to meet every month.    Mental Status Exam: Appearance:   Casual     Behavior:  Appropriate  Motor:  Normal  Speech/Language:   Normal Rate  Affect:  Appropriate  Mood:  normal  Thought process:  normal  Thought content:    WNL  Sensory/Perceptual disturbances:    WNL  Orientation:  oriented to person, place, time/date, and situation  Attention:  Good  Concentration:  Good  Memory:  WNL  Fund of knowledge:   Good  Insight:    Good  Judgment:   Good  Impulse Control:  Good    Reported Symptoms:   stress  Risk Assessment: Danger to Self:  No Self-injurious Behavior: No Danger to Others: No Duty to Warn:no Physical Aggression / Violence:No  Access to Firearms a concern: No  Gang Involvement:No  Patient / guardian was educated about steps to take if suicide or homicide risk level increases between visits: n/a While future psychiatric events cannot be accurately predicted, the patient does not currently require acute inpatient psychiatric care and does not currently meet [facility] involuntary commitment criteria.  Substance Abuse History: Current substance abuse: No     Past Psychiatric History:   Previous psychological history is significant for depression Outpatient Providers:pt was in therapy in the past with Trey Paula. History of Psych Hospitalization: Yes  Psychological Testing:  n/a    Pt has been diagnosed with Major depression, schizoaffactive disorder depressed type and OCD.  Pt sees a psychiatrist at [facility]  She is prescribed Abilify, Welbutrin, Cymbalta, and Seroquil.  Pt feels like this is a good and stable medication regimin for her.    In 1999-2004 pt was psychiatrically hospitalized about 5 times for SI.   Pt has not been hospitalized since 2004.    Abuse History:  Victim of: No.,  n/a    Report needed: No. Victim of Neglect:No. Perpetrator of  n/a   Witness / Exposure to Domestic Violence:  No   Protective Services Involvement: No  Witness to [employer] Violence:  No   Family History:  Family History  Problem Relation Age of Onset   Cancer Maternal Aunt        breast ca   Breast cancer Maternal Aunt    Heart attack Maternal Grandmother 2052/07/23   Heart attack Maternal Grandfather 88   Hypertension Sister    Hypertension Sister    Diabetes Mother    Hypertension Mother    Hyperlipidemia Mother    Stroke Mother    Depression Mother    Eating disorder Mother    Hypertension Father    Seizures Maternal Uncle     Alcohol abuse Neg Hx    Anxiety disorder Neg Hx    Bipolar disorder Neg Hx    Drug abuse Neg Hx    Schizophrenia Neg Hx    Colon cancer Neg Hx    Esophageal cancer Neg Hx    Stomach cancer Neg Hx    Rectal cancer Neg Hx     Living situation: the patient lives alone  Pt grew up with both parents and 3 older sisters.   Pt's parents were share croppers until they were in their 07-24-22.   Family history of mental health issues: none. No family substance abuse or childhood abuse.   Pt states that overall she had a good childhood.   Her father worked in a factory and her mother worked as a Solicitor.   She felt loved and cared for.   Pt's first depressive episode occurred when she was 61 years old.   Both of pt's parents are deceased.   Pt's mother died in Jul 24, 2015.    Sexual Orientation: Straight  Relationship Status: single  Name of spouse / other:n/a If a parent, number of children / ages:none  Support Systems: friends  Financial Stress:  No   Income/Employment/Disability: Employment  Armed forces logistics/support/administrative officer: No   Educational History: Education: Scientist, product/process development: Protestant  Any cultural differences that may affect / interfere with treatment:  not applicable   Recreation/Hobbies: reading and writing.  Stressors: Other: mental health, family at times.    Strengths: Supportive Relationships, Church, Spirituality, Hopefulness, Conservator, museum/gallery, and Able to Communicate Effectively  Barriers:  n/a   Legal History: Pending legal issue / charges: The patient has no significant history of legal issues. 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   Diabetes mellitus, type II ([place])    Difficult airway for intubation 03/13/2019   Difficult intubation 07/09/2013   Glidescope used, see Anesthesia note   DVT (deep venous thrombosis) ([place]) 2012-07-23   LEFT LEG   Gastritis    Glaucoma    Heart murmur    History of blood clots    Hyperlipidemia    Hypertension    Hypokalemia 03/05/2013   Kidney disease    stage ll   Neuromuscular disorder ([place])    " NEUROPATHY IN  MY HANDS"   Obsessive-compulsive disorder    Osteoarthritis    Rheumatoid  arthritis ([place])    Rheumatoid arthritis ([place]) 2016   Schizo-affective psychosis ([place])    Schizoaffective disorder ([place])    Scleritis of both eyes    Sleep apnea    Bipap   Suppurative hidradenitis    Undifferentiated connective tissue disease ([place])     Past Surgical History:  Procedure Laterality Date   BUNIONECTOMY     CERVICAL CONIZATION W/BX     COLONOSCOPY WITH PROPOFOL N/A 03/13/2019   Procedure: COLONOSCOPY WITH PROPOFOL;  Surgeon: Doran Stabler, MD;  Location: [facility];  Service: Gastroenterology;  Laterality: N/A;   LAPAROSCOPIC ADRENALECTOMY Left 07/09/2013   Procedure: LAPAROSCOPIC LEFT  ADRENALECTOMY;  Surgeon: Earnstine Regal, MD;  Location: [facility];  Service: General;  Laterality: Left;   POLYPECTOMY  03/13/2019   Procedure: POLYPECTOMY;  Surgeon: Doran Stabler, MD;  Location: [facility];  Service: Gastroenterology;;   sweat gland removal  1975    Medications: Current Outpatient Medications  Medication Sig Dispense Refill   amLODipine (NORVASC) 5 MG tablet Take 5 mg by mouth every morning.      ARIPiprazole (ABILIFY) 30 MG tablet Take 1 tablet (30 mg total) by mouth daily. 90 tablet 1   aspirin EC 81 MG tablet Take 162 mg by mouth daily.     atorvastatin (LIPITOR) 20 MG tablet Take 1 tablet by mouth daily.     Biotin 5000 MCG CAPS Take 1 capsule by mouth  daily.     buPROPion (WELLBUTRIN XL) 150 MG 24 hr tablet Take 3 tablets (450 mg total) by mouth daily. 270 tablet 1   Coenzyme Q10 (COQ-10) 150 MG CAPS Take 300 mg by mouth daily.     DULoxetine (CYMBALTA) 30 MG capsule Take 3 capsules (90 mg total) by mouth daily. 270 capsule 1   Famotidine (PEPCID AC PO) Take 20 mg by mouth daily.     furosemide (LASIX) 40 MG tablet Take 40 mg by mouth.     glucosamine-chondroitin 500-400 MG tablet Take 2 tablets by mouth every morning.     inFLIXimab-abda (RENFLEXIS IV) Inject into the vein.     inFLIXimab-dyyb 3 mg/kg in sodium chloride 0.9 % Inject 3 mg/kg into the vein every 8 (eight) weeks.     Krill Oil 300 MG CAPS Take 300 mg by mouth daily.     levobunolol (BETAGAN) 0.5 % ophthalmic solution Place 1 drop into both eyes every morning.      Multiple Vitamin (MULTIVITAMIN WITH MINERALS) TABS tablet Take 1 tablet by mouth daily.     oxyCODONE-acetaminophen (PERCOCET/ROXICET) 5-325 MG tablet Take 1-2 tablets by mouth every 4 (four) hours as needed for severe pain.     PRESCRIPTION MEDICATION Inhale 1 application into the lungs at bedtime. BiPAP setting 8/12     QUEtiapine (SEROQUEL) 300 MG tablet Take 2 tablets (600 mg total) by mouth at bedtime. 180 tablet 1   Semaglutide, 1 MG/DOSE, (OZEMPIC, 1 MG/DOSE,) 4 MG/3ML SOPN Inject 1 mg into the skin once a week. (Patient taking differently: Inject 0.5 mg into the skin once a week.) 3 mL 6   Turmeric POWD Take 1,500 mcg by mouth daily at 12 noon.      verapamil (CALAN-SR) 180 MG CR tablet Take 1 tablet (180 mg total) by mouth daily. 30 tablet 1   Vitamin D, Ergocalciferol, (DRISDOL) 1.25 MG (50000 UNIT) CAPS capsule TAKE 1 CAPSULE BY MOUTH EVERY 7 DAYS 12 capsule 0   No current facility-administered medications for this visit.  Allergies  Allergen Reactions   Ace Inhibitors Anaphylaxis   Arava [Leflunomide] Other (See Comments)    Nephritis and kidney clearance issues   Travatan [Travoprost] Other (See  Comments)    Red eyes, irritation    Methotrexate Other (See Comments)    Diagnoses:  F33.1  Plan of Care: Recommend ongoing therapy.   Pt participated in setting treatment goals.   Plan to continue to meet monthly.    Treatment Plan  (Treatment Plan Target Date:  09/23/2022) Client Abilities/Strengths  Pt is bright, engaging, and motivated for therapy.   Client Treatment Preferences  Individual therapy.  Client Statement of Needs  Improve coping skills.  Symptoms  Depressed or irritable mood.  Problems Addressed  Unipolar Depression Goals 1. Alleviate depressive symptoms and return to previous level of effective functioning. 2. Appropriately grieve the loss in order to normalize mood and to return to previously adaptive level of functioning. Objective Learn and implement behavioral strategies to overcome depression. Target Date: 2022-09-23 Frequency: Monthly  Progress: 50 Modality: individual  Related Interventions Engage the client in "behavioral activation," increasing his/her activity level and contact with sources of reward, while identifying processes that inhibit activation.  Use behavioral techniques such as instruction, rehearsal, role-playing, role reversal, as needed, to facilitate activity in the client's daily life; reinforce success. Assist the client in developing skills that increase the likelihood of deriving pleasure from behavioral activation (e.g., assertiveness skills, developing an exercise plan, less internal/more external focus, increased social involvement); reinforce success. Objective Identify important people in life, past and present, and describe the quality, good and poor, of those relationships. Target Date: 2022-09-23 Frequency: Monthly  Progress: 50 Modality: individual  Related Interventions Conduct Interpersonal Therapy beginning with the assessment of the client's "interpersonal inventory" of important past and present relationships; develop a  case formulation linking depression to grief, interpersonal role disputes, role transitions, and/or interpersonal deficits). Objective Learn and implement problem-solving and decision-making skills. Target Date: 2022-09-23 Frequency: Monthly  Progress: 50 Modality: individual  Related Interventions Conduct Problem-Solving Therapy using techniques such as psychoeducation, modeling, and role-playing to teach client problem-solving skills (i.e., defining a problem specifically, generating possible solutions, evaluating the pros and cons of each solution, selecting and implementing a plan of action, evaluating the efficacy of the plan, accepting or revising the plan); role-play application of the problem-solving skill to a real life issue. Encourage in the client the development of a positive problem orientation in which problems and solving them are viewed as a natural part of life and not something to be feared, despaired, or avoided. 3. Develop healthy interpersonal relationships that lead to the alleviation and help prevent the relapse of depression. 4. Develop healthy thinking patterns and beliefs about self, others, and the world that lead to the alleviation and help prevent the relapse of depression. 5. Recognize, accept, and cope with feelings of depression. Diagnosis F33.1  Conditions For Discharge Achievement of treatment goals and objectives    Clint Bolder, LCSW

## 2021-10-19 ENCOUNTER — Ambulatory Visit (INDEPENDENT_AMBULATORY_CARE_PROVIDER_SITE_OTHER): Payer: Medicare Other | Admitting: Psychology

## 2021-10-19 DIAGNOSIS — F331 Major depressive disorder, recurrent, moderate: Secondary | ICD-10-CM

## 2021-10-19 NOTE — Progress Notes (Signed)
[facility] Counselor/Therapist Progress Note  Patient ID: Ashlei Niue, MRN: [id number],    Date: 10/19/2021  Time Spent: 3:00pm-3:45pm    45 minutes   Treatment Type: Individual Therapy  Reported Symptoms: stress  Mental Status Exam: Appearance:  Casual     Behavior: Appropriate  Motor: Normal  Speech/Language:  Normal Rate  Affect: Appropriate  Mood: normal  Thought process: normal  Thought content:   WNL  Sensory/Perceptual disturbances:   WNL  Orientation: oriented to person, place, time/date, and situation  Attention: Good  Concentration: Good  Memory: WNL  Fund of knowledge:  Good  Insight:   Good  Judgment:  Good  Impulse Control: Good   Risk Assessment: Danger to Self:  No Self-injurious Behavior: No Danger to Others: No Duty to Warn:no Physical Aggression / Violence:No  Access to Firearms a concern: No  Gang Involvement:No   Subjective: Pt present for face-to-face individual therapy via video Webex.  Pt consents to telehealth video session due to COVID 19 pandemic. Location of pt: home Location of therapist: home office.  Pt talked about her fellowship [employer] experience and that she is feeling ready to come home. The fellowship has been a learning experience but the past couple of weeks have been more challenging.  Pt shared what she has learned from her experience.  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   Pt has started working on her budget and is using an app called [employer] that is very helpful for her.  She has been working on getting out of debt and saving for vacation.  Addressed pt's financial goals. Provided supportive therapy.    Interventions: Cognitive Behavioral Therapy and Insight-Oriented  Diagnosis:  F33.1  Plan of Care: Recommend ongoing therapy.   Pt participated in setting treatment goals.   Plan to continue to meet monthly.    Treatment Plan  (Treatment Plan Target Date:  09/23/2022) Client  Abilities/Strengths  Pt is bright, engaging, and motivated for therapy.   Client Treatment Preferences  Individual therapy.  Client Statement of Needs  Improve coping skills.  Symptoms  Depressed or irritable mood.  Problems Addressed  Unipolar Depression Goals 1. Alleviate depressive symptoms and return to previous level of effective functioning. 2. Appropriately grieve the loss in order to normalize mood and to return to previously adaptive level of functioning. Objective Learn and implement behavioral strategies to overcome depression. Target Date: 2022-09-23 Frequency: Monthly  Progress: 50 Modality: individual  Related Interventions Engage the client in "behavioral activation," increasing his/her activity level and contact with sources of reward, while identifying processes that inhibit activation.  Use behavioral techniques such as instruction, rehearsal, role-playing, role reversal, as needed, to facilitate activity in the client's daily life; reinforce success. Assist the client in developing skills that increase the likelihood of deriving pleasure from behavioral activation (e.g., assertiveness skills, developing an exercise plan, less internal/more external focus, increased social involvement); reinforce success. Objective Identify important people in life, past and present, and describe the quality, good and poor, of those relationships. Target Date: 2022-09-23 Frequency: Monthly  Progress: 50 Modality: individual  Related Interventions Conduct Interpersonal Therapy beginning with the assessment of the client's "interpersonal inventory" of important past and present relationships; develop a case formulation linking depression to grief, interpersonal role disputes, role transitions, and/or interpersonal deficits). Objective Learn and implement problem-solving and decision-making skills. Target Date: 2022-09-23 Frequency: Monthly  Progress: 50 Modality: individual  Related  Interventions Conduct Problem-Solving Therapy using techniques such as psychoeducation, modeling,  and role-playing to teach client problem-solving skills (i.e., defining a problem specifically, generating possible solutions, evaluating the pros and cons of each solution, selecting and implementing a plan of action, evaluating the efficacy of the plan, accepting or revising the plan); role-play application of the problem-solving skill to a real life issue. Encourage in the client the development of a positive problem orientation in which problems and solving them are viewed as a natural part of life and not something to be feared, despaired, or avoided. 3. Develop healthy interpersonal relationships that lead to the alleviation and help prevent the relapse of depression. 4. Develop healthy thinking patterns and beliefs about self, others, and the world that lead to the alleviation and help prevent the relapse of depression. 5. Recognize, accept, and cope with feelings of depression. Diagnosis F33.1  Conditions For Discharge Achievement of treatment goals and objectives   Clint Bolder, LCSW

## 2021-10-30 ENCOUNTER — Other Ambulatory Visit (HOSPITAL_COMMUNITY): Payer: Self-pay

## 2021-10-30 MED ORDER — OZEMPIC (1 MG/DOSE) 4 MG/3ML ~~LOC~~ SOPN
1.0000 mg | PEN_INJECTOR | SUBCUTANEOUS | 2 refills | Status: DC
  Filled 2021-10-30: qty 3, 28d supply, fill #0
  Filled 2021-11-24: qty 3, 28d supply, fill #1

## 2021-11-18 ENCOUNTER — Ambulatory Visit (INDEPENDENT_AMBULATORY_CARE_PROVIDER_SITE_OTHER): Payer: Medicare Other | Admitting: Psychology

## 2021-11-18 DIAGNOSIS — F331 Major depressive disorder, recurrent, moderate: Secondary | ICD-10-CM

## 2021-11-18 NOTE — Progress Notes (Signed)
[facility] Counselor/Therapist Progress Note  Patient ID: Jeanne [name], MRN: [id number],    Date: 11/18/2021  Time Spent: 12:00pm-12:45pm    45 minutes   Treatment Type: Individual Therapy  Reported Symptoms: stress  Mental Status Exam: Appearance:  Casual     Behavior: Appropriate  Motor: Normal  Speech/Language:  Normal Rate  Affect: Appropriate  Mood: normal  Thought process: normal  Thought content:   WNL  Sensory/Perceptual disturbances:   WNL  Orientation: oriented to person, place, time/date, and situation  Attention: Good  Concentration: Good  Memory: WNL  Fund of knowledge:  Good  Insight:   Good  Judgment:  Good  Impulse Control: Good   Risk Assessment: Danger to Self:  No Self-injurious Behavior: No Danger to Others: No Duty to Warn:no Physical Aggression / Violence:No  Access to Firearms a concern: No  Gang Involvement:No   Subjective: Pt present for face-to-face individual therapy in person. Pt talked about being glad to be home and is trying to get back on track with exercise and diet.   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  She is procrastinating doing the planning.  Worked on time management and setting small attainable goals.   Pt states she is stable and doing well regarding her mood.   Provided supportive therapy.    Interventions: Cognitive Behavioral Therapy and Insight-Oriented  Diagnosis:  F33.1  Plan of Care: Recommend ongoing therapy.   Pt participated in setting treatment goals.   Plan to continue to meet monthly.    Treatment Plan  (Treatment Plan Target Date:  09/23/2022) Client Abilities/Strengths  Pt is bright, engaging, and motivated for therapy.   Client Treatment Preferences  Individual therapy.  Client Statement of Needs  Improve coping skills.  Symptoms  Depressed or irritable mood.  Problems Addressed  Unipolar Depression Goals 1. Alleviate depressive  symptoms and return to previous level of effective functioning. 2. Appropriately grieve the loss in order to normalize mood and to return to previously adaptive level of functioning. Objective Learn and implement behavioral strategies to overcome depression. Target Date: 2022-09-23 Frequency: Monthly  Progress: 50 Modality: individual  Related Interventions Engage the client in "behavioral activation," increasing his/her activity level and contact with sources of reward, while identifying processes that inhibit activation.  Use behavioral techniques such as instruction, rehearsal, role-playing, role reversal, as needed, to facilitate activity in the client's daily life; reinforce success. Assist the client in developing skills that increase the likelihood of deriving pleasure from behavioral activation (e.g., assertiveness skills, developing an exercise plan, less internal/more external focus, increased social involvement); reinforce success. Objective Identify important people in life, past and present, and describe the quality, good and poor, of those relationships. Target Date: 2022-09-23 Frequency: Monthly  Progress: 50 Modality: individual  Related Interventions Conduct Interpersonal Therapy beginning with the assessment of the client's "interpersonal inventory" of important past and present relationships; develop a case formulation linking depression to grief, interpersonal role disputes, role transitions, and/or interpersonal deficits). Objective Learn and implement problem-solving and decision-making skills. Target Date: 2022-09-23 Frequency: Monthly  Progress: 50 Modality: individual  Related Interventions Conduct Problem-Solving Therapy using techniques such as psychoeducation, modeling, and role-playing to teach client problem-solving skills (i.e., defining a problem specifically, generating possible solutions, evaluating the pros and cons of each solution, selecting and implementing a  plan of action, evaluating the efficacy of the plan, accepting or revising the plan); role-play application of the problem-solving skill to a real  life issue. Encourage in the client the development of a positive problem orientation in which problems and solving them are viewed as a natural part of life and not something to be feared, despaired, or avoided. 3. Develop healthy interpersonal relationships that lead to the alleviation and help prevent the relapse of depression. 4. Develop healthy thinking patterns and beliefs about self, others, and the world that lead to the alleviation and help prevent the relapse of depression. 5. Recognize, accept, and cope with feelings of depression. Diagnosis F33.1  Conditions For Discharge Achievement of treatment goals and objectives   Clint Bolder, LCSW

## 2021-11-19 ENCOUNTER — Telehealth (HOSPITAL_BASED_OUTPATIENT_CLINIC_OR_DEPARTMENT_OTHER): Payer: Medicare Other | Admitting: Psychiatry

## 2021-11-19 DIAGNOSIS — F331 Major depressive disorder, recurrent, moderate: Secondary | ICD-10-CM

## 2021-11-19 DIAGNOSIS — F429 Obsessive-compulsive disorder, unspecified: Secondary | ICD-10-CM | POA: Diagnosis not present

## 2021-11-19 DIAGNOSIS — F251 Schizoaffective disorder, depressive type: Secondary | ICD-10-CM | POA: Diagnosis not present

## 2021-11-19 MED ORDER — QUETIAPINE FUMARATE 300 MG PO TABS: 600.0000 mg | ORAL_TABLET | Freq: Every day | ORAL | 1 refills | Status: DC

## 2021-11-19 MED ORDER — DULOXETINE HCL 30 MG PO CPEP: 90.0000 mg | ORAL_CAPSULE | Freq: Every day | ORAL | 1 refills | Status: DC

## 2021-11-19 MED ORDER — BUPROPION HCL ER (XL) 150 MG PO TB24: 450.0000 mg | ORAL_TABLET | Freq: Every day | ORAL | 1 refills | Status: DC

## 2021-11-19 MED ORDER — ARIPIPRAZOLE 30 MG PO TABS: 30.0000 mg | ORAL_TABLET | Freq: Every day | ORAL | 1 refills | Status: DC

## 2021-11-19 NOTE — Progress Notes (Signed)
Virtual Visit via Video Note  I connected with Jeanne [name] on 11/19/21 at 11:00 AM EDT by   a video enabled telemedicine application and verified that I am speaking with the correct person using two identifiers.  Location: Patient: home Provider: office   I discussed the limitations of evaluation and management by telemedicine and the availability of in person appointments. The patient expressed understanding and agreed to proceed.  History of Present Illness: "I'm good". Jeanne [name] is still working on weight loss. She has lost 40 lbs and still has 60 lbs to go. She has been going to the pool several times a week. Her health is ok. She went to the her PCP for a physical and is doing well. Jeanne [name] denies depression and can't recall when she was last depressed. She denies worthlessness, isolation and anhedonia. She denies paranoia, ideas of reference and hallucinations. She denies SI/HI. She has no concerns at this time. She takes her meds daily and is glad they work.   Observations/Objective: Psychiatric Specialty Exam: ROS  There were no vitals taken for this visit.There is no height or weight on file to calculate BMI.  General Appearance: Casual, Neat, and Well Groomed  Eye Contact:  Good  Speech:  Clear and Coherent and Normal Rate  Volume:  Normal  Mood:  Euthymic  Affect:  Full Range  Thought Process:  Goal Directed, Linear, and Descriptions of Associations: Intact  Orientation:  Full (Time, Place, and Person)  Thought Content:  Logical  Suicidal Thoughts:  No  Homicidal Thoughts:  No  Memory:  Immediate;   Good  Judgement:  Good  Insight:  Good  Psychomotor Activity:  Normal  Concentration:  Concentration: Good  Recall:  Good  Fund of Knowledge:  Good  Language:  Good  Akathisia:  No  Handed:  Right  AIMS (if indicated):     Assets:  Communication Skills Desire for Improvement Financial Resources/Insurance Housing Leisure Time Resilience Social  Support Talents/Skills Transportation Vocational/Educational  ADL's:  Intact  Cognition:  WNL  Sleep:        Assessment and Plan:     11/19/2021   11:29 AM 06/04/2021    9:06 AM 01/15/2021    9:19 AM 10/16/2020    9:06 AM 07/24/2020    8:50 AM  Depression screen PHQ 2/9  Decreased Interest 0 0 0 0 0  Down, Depressed, Hopeless 0 0 0 0 1  PHQ - 2 Score 0 0 0 0 1  Altered sleeping     0  Tired, decreased energy     0  Change in appetite     0  Feeling bad or failure about yourself      0  Trouble concentrating     0  Moving slowly or fidgety/restless     0  Suicidal thoughts     0  PHQ-9 Score     1  Difficult doing work/chores     Not difficult at all    Flowsheet Row Video Visit from 11/19/2021 in [facility] Video Visit from 06/04/2021 in [facility] Video Visit from 01/15/2021 in [facility]  C-SSRS RISK CATEGORY No Risk No Risk No Risk         Status of current problems: stable  Meds:  1. Schizoaffective disorder, depressive type (HCC) - ARIPiprazole (ABILIFY) 30 MG tablet; Take 1 tablet (30 mg total) by mouth daily.  Dispense: 90 tablet; Refill: 1 - buPROPion (  WELLBUTRIN XL) 150 MG 24 hr tablet; Take 3 tablets (450 mg total) by mouth daily.  Dispense: 270 tablet; Refill: 1 - DULoxetine (CYMBALTA) 30 MG capsule; Take 3 capsules (90 mg total) by mouth daily.  Dispense: 270 capsule; Refill: 1 - QUEtiapine (SEROQUEL) 300 MG tablet; Take 2 tablets (600 mg total) by mouth at bedtime.  Dispense: 180 tablet; Refill: 1  2. Obsessive-compulsive disorder, unspecified type - DULoxetine (CYMBALTA) 30 MG capsule; Take 3 capsules (90 mg total) by mouth daily.  Dispense: 270 capsule; Refill: 1  3. Major depressive disorder, recurrent episode, moderate (HCC)    Cassander is reporting benefit and denies SE with treatment with 2 antipsychotics. Her symptoms are well  controlled and it has been many years since she was hospitalized in an inpatient psychiatric unit. She wants to continue treatment with both medications.   Labs:   She will get an EKG in September 2023 at her PCP She will send in a copy of recent lab work  Therapy: brief supportive therapy provided.   Collaboration of Care: Other none  Patient/Guardian was advised Release of Information must be obtained prior to any record release in order to collaborate their care with an outside provider. Patient/Guardian was advised if they have not already done so to contact the registration department to sign all necessary forms in order for [name] to release information regarding their care.   Consent: Patient/Guardian gives verbal consent for treatment and assignment of benefits for services provided during this visit. Patient/Guardian expressed understanding and agreed to proceed.     Follow Up Instructions: Follow up in 5-6 months or sooner if needed    I discussed the assessment and treatment plan with the patient. The patient was provided an opportunity to ask questions and all were answered. The patient agreed with the plan and demonstrated an understanding of the instructions.   The patient was advised to call back or seek an in-person evaluation if the symptoms worsen or if the condition fails to improve as anticipated.  I provided 10 minutes of non-face-to-face time during this encounter.   Charlcie Cradle, MD

## 2021-11-25 ENCOUNTER — Encounter (INDEPENDENT_AMBULATORY_CARE_PROVIDER_SITE_OTHER): Payer: Self-pay

## 2021-11-25 ENCOUNTER — Other Ambulatory Visit (HOSPITAL_COMMUNITY): Payer: Self-pay

## 2021-12-16 ENCOUNTER — Ambulatory Visit (INDEPENDENT_AMBULATORY_CARE_PROVIDER_SITE_OTHER): Payer: Medicare Other | Admitting: Psychology

## 2021-12-16 DIAGNOSIS — F331 Major depressive disorder, recurrent, moderate: Secondary | ICD-10-CM | POA: Diagnosis not present

## 2021-12-16 NOTE — Progress Notes (Signed)
[facility] Counselor/Therapist Progress Note  Patient ID: Avigail Niue, MRN: [id number],    Date: 12/16/2021  Time Spent: 12:00pm-12:45pm    45 minutes   Treatment Type: Individual Therapy  Reported Symptoms: stress  Mental Status Exam: Appearance:  Casual     Behavior: Appropriate  Motor: Normal  Speech/Language:  Normal Rate  Affect: Appropriate  Mood: normal  Thought process: normal  Thought content:   WNL  Sensory/Perceptual disturbances:   WNL  Orientation: oriented to person, place, time/date, and situation  Attention: Good  Concentration: Good  Memory: WNL  Fund of knowledge:  Good  Insight:   Good  Judgment:  Good  Impulse Control: Good   Risk Assessment: Danger to Self:  No Self-injurious Behavior: No Danger to Others: No Duty to Warn:no Physical Aggression / Violence:No  Access to Firearms a concern: No  Gang Involvement:No   Subjective: Pt present for face-to-face individual therapy via video Webex.  Pt consents to telehealth video session due to COVID 19 pandemic. Location of pt: home Location of therapist: home office.  Pt talked about going to the pool to exercise and has been going to her weight loss program.   Pt is realizing that for pain management she needs to do water exercise at the pool.   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  She has been preparing for it and has not been procrastinating.  At times she spends a lot of time ruminating about things before she writes.   She needs that as part of her creative process.    Pt is going to have cataract surgery in the next few weeks and she is glad she will be able to see better but she is anxious about the surgery.   Pt states she is stable and doing well overall regarding her mood.   Provided supportive therapy.    Interventions: Cognitive Behavioral Therapy and Insight-Oriented  Diagnosis:  F33.1  Plan of Care: Recommend ongoing therapy.   Pt participated in  setting treatment goals.   Plan to continue to meet monthly.    Treatment Plan  (Treatment Plan Target Date:  09/23/2022) Client Abilities/Strengths  Pt is bright, engaging, and motivated for therapy.   Client Treatment Preferences  Individual therapy.  Client Statement of Needs  Improve coping skills.  Symptoms  Depressed or irritable mood.  Problems Addressed  Unipolar Depression Goals 1. Alleviate depressive symptoms and return to previous level of effective functioning. 2. Appropriately grieve the loss in order to normalize mood and to return to previously adaptive level of functioning. Objective Learn and implement behavioral strategies to overcome depression. Target Date: 2022-09-23 Frequency: Monthly  Progress: 50 Modality: individual  Related Interventions Engage the client in "behavioral activation," increasing his/her activity level and contact with sources of reward, while identifying processes that inhibit activation.  Use behavioral techniques such as instruction, rehearsal, role-playing, role reversal, as needed, to facilitate activity in the client's daily life; reinforce success. Assist the client in developing skills that increase the likelihood of deriving pleasure from behavioral activation (e.g., assertiveness skills, developing an exercise plan, less internal/more external focus, increased social involvement); reinforce success. Objective Identify important people in life, past and present, and describe the quality, good and poor, of those relationships. Target Date: 2022-09-23 Frequency: Monthly  Progress: 50 Modality: individual  Related Interventions Conduct Interpersonal Therapy beginning with the assessment of the client's "interpersonal inventory" of important past and present relationships; develop a case formulation linking depression to  grief, interpersonal role disputes, role transitions, and/or interpersonal deficits). Objective Learn and implement  problem-solving and decision-making skills. Target Date: 2022-09-23 Frequency: Monthly  Progress: 50 Modality: individual  Related Interventions Conduct Problem-Solving Therapy using techniques such as psychoeducation, modeling, and role-playing to teach client problem-solving skills (i.e., defining a problem specifically, generating possible solutions, evaluating the pros and cons of each solution, selecting and implementing a plan of action, evaluating the efficacy of the plan, accepting or revising the plan); role-play application of the problem-solving skill to a real life issue. Encourage in the client the development of a positive problem orientation in which problems and solving them are viewed as a natural part of life and not something to be feared, despaired, or avoided. 3. Develop healthy interpersonal relationships that lead to the alleviation and help prevent the relapse of depression. 4. Develop healthy thinking patterns and beliefs about self, others, and the world that lead to the alleviation and help prevent the relapse of depression. 5. Recognize, accept, and cope with feelings of depression. Diagnosis F33.1  Conditions For Discharge Achievement of treatment goals and objectives   Clint Bolder, LCSW

## 2022-01-13 ENCOUNTER — Ambulatory Visit: Payer: Medicare Other | Admitting: Psychology

## 2022-01-15 ENCOUNTER — Telehealth (HOSPITAL_COMMUNITY): Payer: Self-pay | Admitting: *Deleted

## 2022-01-15 NOTE — Telephone Encounter (Signed)
EKG received this morning from [facility]. Testing done on 01/15/22.   QT/QTC 436/453 ms  EKG read as abnormal r/t RBBB.   Will send to [facility].

## 2022-01-18 ENCOUNTER — Other Ambulatory Visit (HOSPITAL_COMMUNITY): Payer: Self-pay

## 2022-01-18 MED ORDER — OZEMPIC (1 MG/DOSE) 4 MG/3ML ~~LOC~~ SOPN
1.0000 mg | PEN_INJECTOR | SUBCUTANEOUS | 2 refills | Status: DC
  Filled 2022-01-18: qty 3, 28d supply, fill #0
  Filled 2022-02-19: qty 3, 28d supply, fill #1
  Filled 2022-03-18: qty 3, 28d supply, fill #2

## 2022-01-20 ENCOUNTER — Ambulatory Visit (INDEPENDENT_AMBULATORY_CARE_PROVIDER_SITE_OTHER): Payer: Medicare Other | Admitting: Psychology

## 2022-01-20 DIAGNOSIS — F331 Major depressive disorder, recurrent, moderate: Secondary | ICD-10-CM

## 2022-01-20 NOTE — Progress Notes (Signed)
[facility] Counselor/Therapist Progress Note  Patient ID: Germani Niue, MRN: [id number],    Date: 01/20/2022  Time Spent: 2:00pm-2:45pm    45 minutes   Treatment Type: Individual Therapy  Reported Symptoms: stress  Mental Status Exam: Appearance:  Casual     Behavior: Appropriate  Motor: Normal  Speech/Language:  Normal Rate  Affect: Appropriate  Mood: normal  Thought process: normal  Thought content:   WNL  Sensory/Perceptual disturbances:   WNL  Orientation: oriented to person, place, time/date, and situation  Attention: Good  Concentration: Good  Memory: WNL  Fund of knowledge:  Good  Insight:   Good  Judgment:  Good  Impulse Control: Good   Risk Assessment: Danger to Self:  No Self-injurious Behavior: No Danger to Others: No Duty to Warn:no Physical Aggression / Violence:No  Access to Firearms a concern: No  Gang Involvement:No   Subjective: Pt present for face-to-face individual therapy in person. Pt talked about having cataract surgery on both eyes and the outcome has been very positive.   Pt talked about her involvement with [facility] meeting.   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   Pt has been asked to join a committee where she feels she can impact change.  Pt is involved in groups that have some interpersonal conflict but she is able to navigate that dynamics well bc she uses her analytical mind and does not get emotional.   Pt states that is very different from how conflict avoidant she is with her family.   Addressed family dynamics and how pt can begin to work on issues of conflict avoidance.   Pt states she is stable and doing well overall regarding her mood.   Provided supportive therapy.    Interventions: Cognitive Behavioral Therapy and Insight-Oriented  Diagnosis:  F33.1  Plan of Care: Recommend ongoing therapy.   Pt participated in setting treatment goals.   Plan to continue to meet monthly.     Treatment Plan  (Treatment Plan Target Date:  09/23/2022) Client Abilities/Strengths  Pt is bright, engaging, and motivated for therapy.   Client Treatment Preferences  Individual therapy.  Client Statement of Needs  Improve coping skills.  Symptoms  Depressed or irritable mood.  Problems Addressed  Unipolar Depression Goals 1. Alleviate depressive symptoms and return to previous level of effective functioning. 2. Appropriately grieve the loss in order to normalize mood and to return to previously adaptive level of functioning. Objective Learn and implement behavioral strategies to overcome depression. Target Date: 2022-09-23 Frequency: Monthly  Progress: 50 Modality: individual  Related Interventions Engage the client in "behavioral activation," increasing his/her activity level and contact with sources of reward, while identifying processes that inhibit activation.  Use behavioral techniques such as instruction, rehearsal, role-playing, role reversal, as needed, to facilitate activity in the client's daily life; reinforce success. Assist the client in developing skills that increase the likelihood of deriving pleasure from behavioral activation (e.g., assertiveness skills, developing an exercise plan, less internal/more external focus, increased social involvement); reinforce success. Objective Identify important people in life, past and present, and describe the quality, good and poor, of those relationships. Target Date: 2022-09-23 Frequency: Monthly  Progress: 50 Modality: individual  Related Interventions Conduct Interpersonal Therapy beginning with the assessment of the client's "interpersonal inventory" of important past and present relationships; develop a case formulation linking depression to grief, interpersonal role disputes, role transitions, and/or interpersonal deficits). Objective Learn and implement problem-solving and decision-making skills. Target Date:  2022-09-23  Frequency: Monthly  Progress: 50 Modality: individual  Related Interventions Conduct Problem-Solving Therapy using techniques such as psychoeducation, modeling, and role-playing to teach client problem-solving skills (i.e., defining a problem specifically, generating possible solutions, evaluating the pros and cons of each solution, selecting and implementing a plan of action, evaluating the efficacy of the plan, accepting or revising the plan); role-play application of the problem-solving skill to a real life issue. Encourage in the client the development of a positive problem orientation in which problems and solving them are viewed as a natural part of life and not something to be feared, despaired, or avoided. 3. Develop healthy interpersonal relationships that lead to the alleviation and help prevent the relapse of depression. 4. Develop healthy thinking patterns and beliefs about self, others, and the world that lead to the alleviation and help prevent the relapse of depression. 5. Recognize, accept, and cope with feelings of depression. Diagnosis F33.1  Conditions For Discharge Achievement of treatment goals and objectives   Clint Bolder, LCSW

## 2022-02-17 ENCOUNTER — Ambulatory Visit (INDEPENDENT_AMBULATORY_CARE_PROVIDER_SITE_OTHER): Payer: Medicare Other | Admitting: Psychology

## 2022-02-17 DIAGNOSIS — F331 Major depressive disorder, recurrent, moderate: Secondary | ICD-10-CM | POA: Diagnosis not present

## 2022-02-17 NOTE — Progress Notes (Signed)
[facility] Counselor/Therapist Progress Note  Patient ID: Jeanne [name], MRN: [id number],    Date: 02/17/2022  Time Spent: 2:00pm-2:45pm    45 minutes   Treatment Type: Individual Therapy  Reported Symptoms: stress  Mental Status Exam: Appearance:  Casual     Behavior: Appropriate  Motor: Normal  Speech/Language:  Normal Rate  Affect: Appropriate  Mood: normal  Thought process: normal  Thought content:   WNL  Sensory/Perceptual disturbances:   WNL  Orientation: oriented to person, place, time/date, and situation  Attention: Good  Concentration: Good  Memory: WNL  Fund of knowledge:  Good  Insight:   Good  Judgment:  Good  Impulse Control: Good   Risk Assessment: Danger to Self:  No Self-injurious Behavior: No Danger to Others: No Duty to Warn:no Physical Aggression / Violence:No  Access to Firearms a concern: No  Gang Involvement:No   Subjective: Pt present for face-to-face individual therapy in person. 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    Pt is realizing that she "has what she needs right here".   At times she has thought about moving to a bigger city.  Addressed the benefits of staying where she is.   She feels "stuck" at times.   Helped pt reframe being stuck as being settled and "landed".    Addressed what she gets to do and have in her current situation. Worked on Presenter, broadcasting. Pt talked about family dynamics with her sisters and wanting to communicate with them about changing the title of the house pt lives in.   Pt is worried they will say no.   Addressed pt's thoughts and feelings and helped her process family dynamics.   Pt states she is stable and doing well overall regarding her mood.   Provided supportive therapy.    Interventions: Cognitive Behavioral Therapy and Insight-Oriented  Diagnosis:  F33.1  Plan of Care: Recommend ongoing therapy.   Pt participated in setting  treatment goals.   Plan to continue to meet monthly.    Treatment Plan  (Treatment Plan Target Date:  09/23/2022) Client Abilities/Strengths  Pt is bright, engaging, and motivated for therapy.   Client Treatment Preferences  Individual therapy.  Client Statement of Needs  Improve coping skills.  Symptoms  Depressed or irritable mood.  Problems Addressed  Unipolar Depression Goals 1. Alleviate depressive symptoms and return to previous level of effective functioning. 2. Appropriately grieve the loss in order to normalize mood and to return to previously adaptive level of functioning. Objective Learn and implement behavioral strategies to overcome depression. Target Date: 2022-09-23 Frequency: Monthly  Progress: 50 Modality: individual  Related Interventions Engage the client in "behavioral activation," increasing his/her activity level and contact with sources of reward, while identifying processes that inhibit activation.  Use behavioral techniques such as instruction, rehearsal, role-playing, role reversal, as needed, to facilitate activity in the client's daily life; reinforce success. Assist the client in developing skills that increase the likelihood of deriving pleasure from behavioral activation (e.g., assertiveness skills, developing an exercise plan, less internal/more external focus, increased social involvement); reinforce success. Objective Identify important people in life, past and present, and describe the quality, good and poor, of those relationships. Target Date: 2022-09-23 Frequency: Monthly  Progress: 50 Modality: individual  Related Interventions Conduct Interpersonal Therapy beginning with the assessment of the client's "interpersonal inventory" of important past and present relationships; develop a case formulation linking depression to grief, interpersonal role disputes,  role transitions, and/or interpersonal deficits). Objective Learn and implement problem-solving  and decision-making skills. Target Date: 2022-09-23 Frequency: Monthly  Progress: 50 Modality: individual  Related Interventions Conduct Problem-Solving Therapy using techniques such as psychoeducation, modeling, and role-playing to teach client problem-solving skills (i.e., defining a problem specifically, generating possible solutions, evaluating the pros and cons of each solution, selecting and implementing a plan of action, evaluating the efficacy of the plan, accepting or revising the plan); role-play application of the problem-solving skill to a real life issue. Encourage in the client the development of a positive problem orientation in which problems and solving them are viewed as a natural part of life and not something to be feared, despaired, or avoided. 3. Develop healthy interpersonal relationships that lead to the alleviation and help prevent the relapse of depression. 4. Develop healthy thinking patterns and beliefs about self, others, and the world that lead to the alleviation and help prevent the relapse of depression. 5. Recognize, accept, and cope with feelings of depression. Diagnosis F33.1  Conditions For Discharge Achievement of treatment goals and objectives   Clint Bolder, LCSW

## 2022-02-19 ENCOUNTER — Other Ambulatory Visit (HOSPITAL_COMMUNITY): Payer: Self-pay

## 2022-03-17 ENCOUNTER — Ambulatory Visit (INDEPENDENT_AMBULATORY_CARE_PROVIDER_SITE_OTHER): Payer: Medicare Other | Admitting: Psychology

## 2022-03-17 DIAGNOSIS — F331 Major depressive disorder, recurrent, moderate: Secondary | ICD-10-CM | POA: Diagnosis not present

## 2022-03-17 NOTE — Progress Notes (Signed)
Jeanne [name]  Patient ID: Jeanne [name], MRN: [id number],    Date: 03/17/2022  Time Spent: 3:00pm-3:45pm    45 minutes   Treatment Type: Individual Therapy  Reported Symptoms: stress  Mental Status Exam: Appearance:  Casual     Behavior: Appropriate  Motor: Normal  Speech/Language:  Normal Rate  Affect: Appropriate  Mood: normal  Thought process: normal  Thought content:   WNL  Sensory/Perceptual disturbances:   WNL  Orientation: oriented to person, place, time/date, and situation  Attention: Good  Concentration: Good  Memory: WNL  Fund of knowledge:  Good  Insight:   Good  Judgment:  Good  Impulse Control: Good   Risk Assessment: Danger to Self:  No Self-injurious Behavior: No Danger to Others: No Duty to Warn:no Physical Aggression / Violence:No  Access to Firearms a concern: No  Gang Involvement:No   Subjective: Pt present for face-to-face individual therapy in person. Pt talked about her middle sister.  She is 61 yo Jeanne [name] who is volatile and does not have a filter when talking to people.   Pt states she and her sister had an upsetting interaction.  Addressed pt's thoughts and feelings and helped her process family dynamics.  Pt states she does not feel strong enough to stand up to her sisters.  Pt gets put in the middle between her sisters.   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    Pt has a trip to [place] in December with Jeanne [name] and is anxious about the trip and is dreading it.  She does not trust Jeanne [name] who has shown to be emotionally unsafe.   Addressed how pt can take care of herself during the trip.   Provided supportive therapy.    Interventions: Cognitive Behavioral Therapy and Insight-Oriented  Diagnosis:  F33.1  Plan of Care: Recommend ongoing therapy.   Pt participated in setting treatment goals.   Plan to continue to meet monthly.    Treatment Plan  (Treatment Plan Target Date:   09/23/2022) Client Abilities/Strengths  Pt is bright, engaging, and motivated for therapy.   Client Treatment Preferences  Individual therapy.  Client Statement of Needs  Improve coping skills.  Symptoms  Depressed or irritable mood.  Problems Addressed  Unipolar Depression Goals 1. Alleviate depressive symptoms and return to previous level of effective functioning. 2. Appropriately grieve the loss in order to normalize mood and to return to previously adaptive level of functioning. Objective Learn and implement behavioral strategies to overcome depression. Target Date: 2022-09-23 Frequency: Monthly  Progress: 50 Modality: individual  Related Interventions Engage the client in "behavioral activation," increasing his/her activity level and contact with sources of reward, while identifying processes that inhibit activation.  Use behavioral techniques such as instruction, rehearsal, role-playing, role reversal, as needed, to facilitate activity in the client's daily life; reinforce success. Assist the client in developing skills that increase the likelihood of deriving pleasure from behavioral activation (e.g., assertiveness skills, developing an exercise plan, less internal/more external focus, increased social involvement); reinforce success. Objective Identify important people in life, past and present, and describe the quality, good and poor, of those relationships. Target Date: 2022-09-23 Frequency: Monthly  Progress: 50 Modality: individual  Related Interventions Conduct Interpersonal Therapy beginning with the assessment of the client's "interpersonal inventory" of important past and present relationships; develop a case formulation linking depression to grief, interpersonal role disputes, role transitions, and/or interpersonal deficits). Objective Learn and implement problem-solving and decision-making skills. Target Date: 2022-09-23 Frequency:  Monthly  Progress: 50 Modality:  individual  Related Interventions Conduct Problem-Solving Therapy using techniques such as psychoeducation, modeling, and role-playing to teach client problem-solving skills (i.e., defining a problem specifically, generating possible solutions, evaluating the pros and cons of each solution, selecting and implementing a plan of action, evaluating the efficacy of the plan, accepting or revising the plan); role-play application of the problem-solving skill to a real life issue. Encourage in the client the development of a positive problem orientation in which problems and solving them are viewed as a natural part of life and not something to be feared, despaired, or avoided. 3. Develop healthy interpersonal relationships that lead to the alleviation and help prevent the relapse of depression. 4. Develop healthy thinking patterns and beliefs about self, others, and the world that lead to the alleviation and help prevent the relapse of depression. 5. Recognize, accept, and cope with feelings of depression. Diagnosis F33.1  Conditions For Discharge Achievement of treatment goals and objectives   Clint Bolder, LCSW

## 2022-03-18 ENCOUNTER — Other Ambulatory Visit (HOSPITAL_COMMUNITY): Payer: Self-pay

## 2022-04-21 ENCOUNTER — Ambulatory Visit (INDEPENDENT_AMBULATORY_CARE_PROVIDER_SITE_OTHER): Payer: Medicare Other | Admitting: Psychology

## 2022-04-21 ENCOUNTER — Other Ambulatory Visit (HOSPITAL_COMMUNITY): Payer: Self-pay

## 2022-04-21 DIAGNOSIS — F331 Major depressive disorder, recurrent, moderate: Secondary | ICD-10-CM | POA: Diagnosis not present

## 2022-04-21 NOTE — Progress Notes (Signed)
[facility] Counselor/Therapist Progress Note  Patient ID: Jeanne [name], MRN: [id number],    Date: 04/21/2022  Time Spent: 3:00pm-3:45pm    45 minutes   Treatment Type: Individual Therapy  Reported Symptoms: stress  Mental Status Exam: Appearance:  Casual     Behavior: Appropriate  Motor: Normal  Speech/Language:  Normal Rate  Affect: Appropriate  Mood: normal  Thought process: normal  Thought content:   WNL  Sensory/Perceptual disturbances:   WNL  Orientation: oriented to person, place, time/date, and situation  Attention: Good  Concentration: Good  Memory: WNL  Fund of knowledge:  Good  Insight:   Good  Judgment:  Good  Impulse Control: Good   Risk Assessment: Danger to Self:  No Self-injurious Behavior: No Danger to Others: No Duty to Warn:no Physical Aggression / Violence:No  Access to Firearms a concern: No  Gang Involvement:No   Subjective: Pt present for face-to-face individual therapy in person. Pt talked about her sister Jeanne [name].   They went to [place] and the trip was ok overall.   They have had some words since then.  Addressed the interactions.   Pt is trying to stick up for herself with Jeanne [name] and set healthy boundaries.   This has resulted in some distancing from [name].  Helped pt process her feelings and relationship dynamics.   Pt talked about being unhappy with her body regarding her weight.   Addressed the function the extra weight serves.  Pt states it serves as an excuse to not pursue a relationship.   Pt engages in emotional eating.  Worked on healthy coping skills.   Pt is going to see Dr. [name] at [facility] next week to continue to work on eating and weight issues. Pt states she feels shame about her body and her mental illness.   Addressed how shame is healed by bringing it into the light which she can do by continuing to talk about it in therapy.   Provided supportive therapy.    Interventions: Cognitive Behavioral  Therapy and Insight-Oriented  Diagnosis:  F33.1  Plan of Care: Recommend ongoing therapy.   Pt participated in setting treatment goals.   Plan to continue to meet monthly.    Treatment Plan  (Treatment Plan Target Date:  09/23/2022) Client Abilities/Strengths  Pt is bright, engaging, and motivated for therapy.   Client Treatment Preferences  Individual therapy.  Client Statement of Needs  Improve coping skills.  Symptoms  Depressed or irritable mood.  Problems Addressed  Unipolar Depression Goals 1. Alleviate depressive symptoms and return to previous level of effective functioning. 2. Appropriately grieve the loss in order to normalize mood and to return to previously adaptive level of functioning. Objective Learn and implement behavioral strategies to overcome depression. Target Date: 2022-09-23 Frequency: Monthly  Progress: 50 Modality: individual  Related Interventions Engage the client in "behavioral activation," increasing his/her activity level and contact with sources of reward, while identifying processes that inhibit activation.  Use behavioral techniques such as instruction, rehearsal, role-playing, role reversal, as needed, to facilitate activity in the client's daily life; reinforce success. Assist the client in developing skills that increase the likelihood of deriving pleasure from behavioral activation (e.g., assertiveness skills, developing an exercise plan, less internal/more external focus, increased social involvement); reinforce success. Objective Identify important people in life, past and present, and describe the quality, good and poor, of those relationships. Target Date: 2022-09-23 Frequency: Monthly  Progress: 50 Modality: individual  Related Interventions Conduct Interpersonal Therapy beginning with the  assessment of the client's "interpersonal inventory" of important past and present relationships; develop a case formulation linking depression to grief,  interpersonal role disputes, role transitions, and/or interpersonal deficits). Objective Learn and implement problem-solving and decision-making skills. Target Date: 2022-09-23 Frequency: Monthly  Progress: 50 Modality: individual  Related Interventions Conduct Problem-Solving Therapy using techniques such as psychoeducation, modeling, and role-playing to teach client problem-solving skills (i.e., defining a problem specifically, generating possible solutions, evaluating the pros and cons of each solution, selecting and implementing a plan of action, evaluating the efficacy of the plan, accepting or revising the plan); role-play application of the problem-solving skill to a real life issue. Encourage in the client the development of a positive problem orientation in which problems and solving them are viewed as a natural part of life and not something to be feared, despaired, or avoided. 3. Develop healthy interpersonal relationships that lead to the alleviation and help prevent the relapse of depression. 4. Develop healthy thinking patterns and beliefs about self, others, and the world that lead to the alleviation and help prevent the relapse of depression. 5. Recognize, accept, and cope with feelings of depression. Diagnosis F33.1  Conditions For Discharge Achievement of treatment goals and objectives   Clint Bolder, LCSW

## 2022-04-22 ENCOUNTER — Other Ambulatory Visit (HOSPITAL_COMMUNITY): Payer: Self-pay

## 2022-04-22 MED ORDER — OZEMPIC (1 MG/DOSE) 4 MG/3ML ~~LOC~~ SOPN
1.0000 mg | PEN_INJECTOR | SUBCUTANEOUS | 0 refills | Status: DC
  Filled 2022-04-22: qty 3, 28d supply, fill #0

## 2022-04-23 ENCOUNTER — Other Ambulatory Visit (HOSPITAL_COMMUNITY): Payer: Self-pay

## 2022-05-12 ENCOUNTER — Ambulatory Visit (HOSPITAL_COMMUNITY)
Admission: RE | Admit: 2022-05-12 | Discharge: 2022-05-12 | Disposition: A | Discharge status: Home / Self Care | Payer: Medicare Other | Source: Ambulatory Visit | Attending: Vascular Surgery | Admitting: Vascular Surgery

## 2022-05-12 ENCOUNTER — Other Ambulatory Visit (HOSPITAL_COMMUNITY): Payer: Self-pay | Admitting: Internal Medicine

## 2022-05-12 DIAGNOSIS — I83009 Varicose veins of unspecified lower extremity with ulcer of unspecified site: Secondary | ICD-10-CM | POA: Diagnosis not present

## 2022-05-12 DIAGNOSIS — L97909 Non-pressure chronic ulcer of unspecified part of unspecified lower leg with unspecified severity: Secondary | ICD-10-CM | POA: Diagnosis not present

## 2022-05-12 LAB — VAS US ABI WITH/WO TBI
Left ABI: 1.23
Right ABI: 1.21

## 2022-05-13 ENCOUNTER — Encounter (HOSPITAL_COMMUNITY): Payer: Self-pay | Admitting: Psychiatry

## 2022-05-13 ENCOUNTER — Telehealth (HOSPITAL_BASED_OUTPATIENT_CLINIC_OR_DEPARTMENT_OTHER): Payer: Medicare Other | Admitting: Psychiatry

## 2022-05-13 DIAGNOSIS — F429 Obsessive-compulsive disorder, unspecified: Secondary | ICD-10-CM | POA: Diagnosis not present

## 2022-05-13 DIAGNOSIS — F251 Schizoaffective disorder, depressive type: Secondary | ICD-10-CM | POA: Diagnosis not present

## 2022-05-13 MED ORDER — DULOXETINE HCL 30 MG PO CPEP: 90.0000 mg | ORAL_CAPSULE | Freq: Every day | ORAL | 1 refills | Status: DC

## 2022-05-13 MED ORDER — BUPROPION HCL ER (XL) 150 MG PO TB24: 450.0000 mg | ORAL_TABLET | Freq: Every day | ORAL | 1 refills | Status: DC

## 2022-05-13 MED ORDER — QUETIAPINE FUMARATE 300 MG PO TABS: 600.0000 mg | ORAL_TABLET | Freq: Every day | ORAL | 1 refills | Status: DC

## 2022-05-13 MED ORDER — ARIPIPRAZOLE 30 MG PO TABS: 30.0000 mg | ORAL_TABLET | Freq: Every day | ORAL | 1 refills | Status: DC

## 2022-05-13 NOTE — Progress Notes (Signed)
Virtual Visit via Video Note  I connected with Jeanne [name] on 05/13/22 at  8:30 AM EST by a video enabled telemedicine application and verified that I am speaking with the correct person using two identifiers.  Location: Patient: Home Provider: office   I discussed the limitations of evaluation and management by telemedicine and the availability of in person appointments. The patient expressed understanding and agreed to proceed.  History of Present Illness: Jeanne [name] shares she is doing well. She caught  the flu twice in December. She has recovered but hasn't started going back to the gym soon. Jeanne [name] denies depression, anhedonia and hopelessness. She denies SI/HI. Her sleep, appetite and energy are good. Jeanne [name] denies any OCD behaviors or thoughts. She denies paranoia, hallucinations and ideas of reference. She is taking her meds daily and denies SE. The meds seem effective and she wants to continue them. She went to her PCP yesterday and had labs and EKG done.    Observations/Objective: Psychiatric Specialty Exam: ROS  There were no vitals taken for this visit.There is no height or weight on file to calculate BMI.  General Appearance: Neat and Well Groomed  Eye Contact:  Good  Speech:  Clear and Coherent and Normal Rate  Volume:  Normal  Mood:  Euthymic  Affect:  Full Range  Thought Process:  Goal Directed, Linear, and Descriptions of Associations: Intact  Orientation:  Full (Time, Place, and Person)  Thought Content:  Logical  Suicidal Thoughts:  No  Homicidal Thoughts:  No  Memory:  Immediate;   Good  Judgement:  Good  Insight:  Good  Psychomotor Activity:  Normal  Concentration:  Concentration: Good  Recall:  Good  Fund of Knowledge:  Good  Language:  Good  Akathisia:  No  Handed:  Right  AIMS (if indicated):     Assets:  Communication Skills Desire for Improvement Financial Resources/Insurance Housing Leisure Time Resilience Social  Support Talents/Skills Transportation Vocational/Educational  ADL's:  Intact  Cognition:  WNL  Sleep:        Assessment and Plan:     05/13/2022    8:40 AM 11/19/2021   11:29 AM 06/04/2021    9:06 AM 01/15/2021    9:19 AM 10/16/2020    9:06 AM  Depression screen PHQ 2/9  Decreased Interest 0 0 0 0 0  Down, Depressed, Hopeless 0 0 0 0 0  PHQ - 2 Score 0 0 0 0 0    Flowsheet Row Video Visit from 05/13/2022 in [facility] Video Visit from 11/19/2021 in [facility] Video Visit from 06/04/2021 in [facility] No Risk No Risk No Risk          Pt is aware that these meds carry a teratogenic risk. Pt will discuss plan of action if she does or plans to become pregnant in the future.  Status of current problems: stable   Medication management with supportive therapy. Risks and benefits, side effects and alternative treatment options discussed with patient. Pt was given an opportunity to ask questions about medication, illness, and treatment. All current psychiatric medications have been reviewed and discussed with the patient and adjusted as clinically appropriate.  Pt verbalized understanding and verbal consent obtained for treatment.  Meds:  1. Schizoaffective disorder, depressive type (HCC) - ARIPiprazole (ABILIFY) 30 MG tablet; Take 1 tablet (30 mg total) by mouth daily.  Dispense: 90 tablet; Refill: 1 - buPROPion (WELLBUTRIN XL) 150 MG  24 hr tablet; Take 3 tablets (450 mg total) by mouth daily.  Dispense: 270 tablet; Refill: 1 - DULoxetine (CYMBALTA) 30 MG capsule; Take 3 capsules (90 mg total) by mouth daily.  Dispense: 270 capsule; Refill: 1 - QUEtiapine (SEROQUEL) 300 MG tablet; Take 2 tablets (600 mg total) by mouth at bedtime.  Dispense: 180 tablet; Refill: 1  2. Obsessive-compulsive disorder, unspecified type - DULoxetine (CYMBALTA) 30 MG  capsule; Take 3 capsules (90 mg total) by mouth daily.  Dispense: 270 capsule; Refill: 1     Labs: she had labs and EKG done by her PCP yesterday. I will request  records   Therapy: brief supportive therapy provided.    Collaboration of Care: Other none  Patient/Guardian was advised Release of Information must be obtained prior to any record release in order to collaborate their care with an outside provider. Patient/Guardian was advised if they have not already done so to contact the registration department to sign all necessary forms in order for [name] to release information regarding their care.   Consent: Patient/Guardian gives verbal consent for treatment and assignment of benefits for services provided during this visit. Patient/Guardian expressed understanding and agreed to proceed.     Follow Up Instructions: Follow up in 5-6 months or sooner if needed    I discussed the assessment and treatment plan with the patient. The patient was provided an opportunity to ask questions and all were answered. The patient agreed with the plan and demonstrated an understanding of the instructions.   The patient was advised to call back or seek an in-person evaluation if the symptoms worsen or if the condition fails to improve as anticipated.  I provided 9 minutes of non-face-to-face time during this encounter.   Charlcie Cradle, MD

## 2022-05-17 ENCOUNTER — Other Ambulatory Visit (HOSPITAL_COMMUNITY): Payer: Self-pay

## 2022-05-17 MED ORDER — MOUNJARO 5 MG/0.5ML ~~LOC~~ SOAJ
5.0000 mg | SUBCUTANEOUS | 0 refills | Status: DC
  Filled 2022-05-17: qty 2, 28d supply, fill #0

## 2022-05-19 ENCOUNTER — Ambulatory Visit (INDEPENDENT_AMBULATORY_CARE_PROVIDER_SITE_OTHER): Payer: Medicare Other | Admitting: Psychology

## 2022-05-19 DIAGNOSIS — F331 Major depressive disorder, recurrent, moderate: Secondary | ICD-10-CM | POA: Diagnosis not present

## 2022-05-19 NOTE — Progress Notes (Signed)
[facility] Counselor/Therapist Progress Note  Patient ID: Jeanne [name], MRN: [id number],    Date: 05/19/2022  Time Spent: 2:00pm-2:45pm    45 minutes   Treatment Type: Individual Therapy  Reported Symptoms: stress  Mental Status Exam: Appearance:  Casual     Behavior: Appropriate  Motor: Normal  Speech/Language:  Normal Rate  Affect: Appropriate  Mood: normal  Thought process: normal  Thought content:   WNL  Sensory/Perceptual disturbances:   WNL  Orientation: oriented to person, place, time/date, and situation  Attention: Good  Concentration: Good  Memory: WNL  Fund of knowledge:  Good  Insight:   Good  Judgment:  Good  Impulse Control: Good   Risk Assessment: Danger to Self:  No Self-injurious Behavior: No Danger to Others: No Duty to Warn:no Physical Aggression / Violence:No  Access to Firearms a concern: No  Gang Involvement:No   Subjective: Pt present for face-to-face individual therapy in person. Pt talked about getting feedback from her pastor about how the meeting/church members see her.  Pt's pastor stated pt is "deeply loved, deeply respected, worthy of being recorded, and having significant impact."   Addressed how this compares to pt's view of herself.   Pt states she feels she is loved and accepted and that her intellect is respected.  She would love to feel worthy but is not sure she is.  She is unsure of the impact she has on others.   Helped pt process her feelings and view of herself.  Acknowledged that pt received the positive feedback despite having a mental illness and addressed how these things are not mutually exclusive.   Addressed how pt can embrace the positive feedback.  Provided supportive therapy.    Interventions: Cognitive Behavioral Therapy and Insight-Oriented  Diagnosis:  F33.1  Plan of Care: Recommend ongoing therapy.   Pt participated in setting treatment goals.   Plan to continue to meet monthly.    Treatment  Plan  (Treatment Plan Target Date:  09/23/2022) Client Abilities/Strengths  Pt is bright, engaging, and motivated for therapy.   Client Treatment Preferences  Individual therapy.  Client Statement of Needs  Improve coping skills.  Symptoms  Depressed or irritable mood.  Problems Addressed  Unipolar Depression Goals 1. Alleviate depressive symptoms and return to previous level of effective functioning. 2. Appropriately grieve the loss in order to normalize mood and to return to previously adaptive level of functioning. Objective Learn and implement behavioral strategies to overcome depression. Target Date: 2022-09-23 Frequency: Monthly  Progress: 50 Modality: individual  Related Interventions Engage the client in "behavioral activation," increasing his/her activity level and contact with sources of reward, while identifying processes that inhibit activation.  Use behavioral techniques such as instruction, rehearsal, role-playing, role reversal, as needed, to facilitate activity in the client's daily life; reinforce success. Assist the client in developing skills that increase the likelihood of deriving pleasure from behavioral activation (e.g., assertiveness skills, developing an exercise plan, less internal/more external focus, increased social involvement); reinforce success. Objective Identify important people in life, past and present, and describe the quality, good and poor, of those relationships. Target Date: 2022-09-23 Frequency: Monthly  Progress: 50 Modality: individual  Related Interventions Conduct Interpersonal Therapy beginning with the assessment of the client's "interpersonal inventory" of important past and present relationships; develop a case formulation linking depression to grief, interpersonal role disputes, role transitions, and/or interpersonal deficits). Objective Learn and implement problem-solving and decision-making skills. Target Date: 2022-09-23 Frequency:  Monthly  Progress: 50 Modality: individual  Related Interventions Conduct Problem-Solving Therapy using techniques such as psychoeducation, modeling, and role-playing to teach client problem-solving skills (i.e., defining a problem specifically, generating possible solutions, evaluating the pros and cons of each solution, selecting and implementing a plan of action, evaluating the efficacy of the plan, accepting or revising the plan); role-play application of the problem-solving skill to a real life issue. Encourage in the client the development of a positive problem orientation in which problems and solving them are viewed as a natural part of life and not something to be feared, despaired, or avoided. 3. Develop healthy interpersonal relationships that lead to the alleviation and help prevent the relapse of depression. 4. Develop healthy thinking patterns and beliefs about self, others, and the world that lead to the alleviation and help prevent the relapse of depression. 5. Recognize, accept, and cope with feelings of depression. Diagnosis F33.1  Conditions For Discharge Achievement of treatment goals and objectives   Clint Bolder, LCSW

## 2022-06-16 ENCOUNTER — Ambulatory Visit (INDEPENDENT_AMBULATORY_CARE_PROVIDER_SITE_OTHER): Payer: Medicare Other | Admitting: Psychology

## 2022-06-16 DIAGNOSIS — F331 Major depressive disorder, recurrent, moderate: Secondary | ICD-10-CM
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
## 2022-07-14 ENCOUNTER — Ambulatory Visit: Payer: Medicare Other | Admitting: Psychology

## 2022-07-15 ENCOUNTER — Other Ambulatory Visit (HOSPITAL_COMMUNITY): Payer: Self-pay

## 2022-07-15 MED ORDER — MOUNJARO 7.5 MG/0.5ML ~~LOC~~ SOAJ
7.5000 mg | SUBCUTANEOUS | 0 refills | Status: DC
  Filled 2022-07-15: qty 2, 28d supply, fill #0

## 2022-08-09 ENCOUNTER — Other Ambulatory Visit (HOSPITAL_COMMUNITY): Payer: Self-pay

## 2022-08-09 MED ORDER — MOUNJARO 7.5 MG/0.5ML ~~LOC~~ SOAJ
7.5000 mg | SUBCUTANEOUS | 1 refills | Status: DC
  Filled 2022-08-09: qty 6, 84d supply, fill #0
  Filled 2022-11-01: qty 6, 84d supply, fill #1

## 2022-08-10 ENCOUNTER — Other Ambulatory Visit (HOSPITAL_COMMUNITY): Payer: Self-pay

## 2022-08-11 ENCOUNTER — Ambulatory Visit (INDEPENDENT_AMBULATORY_CARE_PROVIDER_SITE_OTHER): Payer: Medicare Other | Admitting: Psychology

## 2022-08-11 DIAGNOSIS — F331 Major depressive disorder, recurrent, moderate: Secondary | ICD-10-CM
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
## 2022-08-23 ENCOUNTER — Encounter (HOSPITAL_COMMUNITY): Payer: Self-pay

## 2022-09-07 ENCOUNTER — Ambulatory Visit (INDEPENDENT_AMBULATORY_CARE_PROVIDER_SITE_OTHER): Payer: Medicare Other | Admitting: Psychology

## 2022-09-07 ENCOUNTER — Other Ambulatory Visit: Payer: Self-pay | Admitting: *Deleted

## 2022-09-07 DIAGNOSIS — Z86718 Personal history of other venous thrombosis and embolism: Secondary | ICD-10-CM

## 2022-09-07 DIAGNOSIS — F331 Major depressive disorder, recurrent, moderate: Secondary | ICD-10-CM

## 2022-09-07 NOTE — Progress Notes (Signed)
[facility] Counselor/Therapist Progress Note  Patient ID: Jeanne [name], MRN: [id number],    Date: 09/07/2022  Time Spent: 4:00pm-4:45pm    45 minutes   Treatment Type: Individual Therapy  Reported Symptoms: stress  Mental Status Exam: Appearance:  Casual     Behavior: Appropriate  Motor: Normal  Speech/Language:  Normal Rate  Affect: Appropriate  Mood: normal  Thought process: normal  Thought content:   WNL  Sensory/Perceptual disturbances:   WNL  Orientation: oriented to person, place, time/date, and situation  Attention: Good  Concentration: Good  Memory: WNL  Fund of knowledge:  Good  Insight:   Good  Judgment:  Good  Impulse Control: Good   Risk Assessment: Danger to Self:  No Self-injurious Behavior: No Danger to Others: No Duty to Warn:no Physical Aggression / Violence:No  Access to Firearms a concern: No  Gang Involvement:No   Subjective: Pt present for face-to-face individual therapy via video.  Pt consents to telehealth video session due to COVID 19 pandemic. Location of pt: home Location of therapist: home office.  Pt talked about finding another spiritual program she wants to attend.  It is a 2 year program through the [employer].  Pt talked about her relationship with her sisters.   Pt wants to talk to her sisters about not going to the family reunion bc she ends up being put in the middle of her sisters who are not talking to each other.  Pt talked about being conflict avoidant and anxious about having the conversation with her sisters.  Worked on how pt can effectively communicate her needs to her sisters. Helped pt process her feelings.  Encouraged pt to increase self care.  Provided supportive therapy.    Interventions: Cognitive Behavioral Therapy and Insight-Oriented  Diagnosis:  F33.1  Plan of Care: Recommend ongoing therapy.   Pt participated in setting treatment goals.   Plan to continue to meet monthly.     Treatment Plan  (Treatment Plan Target Date:  09/23/2022) Client Abilities/Strengths  Pt is bright, engaging, and motivated for therapy.   Client Treatment Preferences  Individual therapy.  Client Statement of Needs  Improve coping skills.  Symptoms  Depressed or irritable mood.  Problems Addressed  Unipolar Depression Goals 1. Alleviate depressive symptoms and return to previous level of effective functioning. 2. Appropriately grieve the loss in order to normalize mood and to return to previously adaptive level of functioning. Objective Learn and implement behavioral strategies to overcome depression. Target Date: 2022-09-23 Frequency: Monthly  Progress: 50 Modality: individual  Related Interventions Engage the client in "behavioral activation," increasing his/her activity level and contact with sources of reward, while identifying processes that inhibit activation.  Use behavioral techniques such as instruction, rehearsal, role-playing, role reversal, as needed, to facilitate activity in the client's daily life; reinforce success. Assist the client in developing skills that increase the likelihood of deriving pleasure from behavioral activation (e.g., assertiveness skills, developing an exercise plan, less internal/more external focus, increased social involvement); reinforce success. Objective Identify important people in life, past and present, and describe the quality, good and poor, of those relationships. Target Date: 2022-09-23 Frequency: Monthly  Progress: 50 Modality: individual  Related Interventions Conduct Interpersonal Therapy beginning with the assessment of the client's "interpersonal inventory" of important past and present relationships; develop a case formulation linking depression to grief, interpersonal role disputes, role transitions, and/or interpersonal deficits). Objective Learn and implement problem-solving and decision-making skills. Target Date: 2022-09-23  Frequency: Monthly  Progress: 50 Modality: individual  Related Interventions Conduct Problem-Solving Therapy using techniques such as psychoeducation, modeling, and role-playing to teach client problem-solving skills (i.e., defining a problem specifically, generating possible solutions, evaluating the pros and cons of each solution, selecting and implementing a plan of action, evaluating the efficacy of the plan, accepting or revising the plan); role-play application of the problem-solving skill to a real life issue. Encourage in the client the development of a positive problem orientation in which problems and solving them are viewed as a natural part of life and not something to be feared, despaired, or avoided. 3. Develop healthy interpersonal relationships that lead to the alleviation and help prevent the relapse of depression. 4. Develop healthy thinking patterns and beliefs about self, others, and the world that lead to the alleviation and help prevent the relapse of depression. 5. Recognize, accept, and cope with feelings of depression. Diagnosis F33.1  Conditions For Discharge Achievement of treatment goals and objectives   Salomon Fick, LCSW

## 2022-09-08 ENCOUNTER — Ambulatory Visit: Payer: Medicare Other | Admitting: Psychology

## 2022-09-09 ENCOUNTER — Telehealth (HOSPITAL_BASED_OUTPATIENT_CLINIC_OR_DEPARTMENT_OTHER): Payer: Medicare Other | Admitting: Psychiatry

## 2022-09-09 ENCOUNTER — Telehealth (HOSPITAL_COMMUNITY): Payer: Self-pay | Admitting: Psychiatry

## 2022-09-09 ENCOUNTER — Encounter (HOSPITAL_COMMUNITY): Payer: Self-pay | Admitting: Psychiatry

## 2022-09-09 DIAGNOSIS — F251 Schizoaffective disorder, depressive type: Secondary | ICD-10-CM | POA: Diagnosis not present

## 2022-09-09 DIAGNOSIS — F429 Obsessive-compulsive disorder, unspecified: Secondary | ICD-10-CM | POA: Diagnosis not present

## 2022-09-09 MED ORDER — BUPROPION HCL ER (XL) 150 MG PO TB24: 450.0000 mg | ORAL_TABLET | Freq: Every day | ORAL | 1 refills | Status: DC

## 2022-09-09 MED ORDER — QUETIAPINE FUMARATE 300 MG PO TABS: 600.0000 mg | ORAL_TABLET | Freq: Every day | ORAL | 1 refills | Status: DC

## 2022-09-09 MED ORDER — DULOXETINE HCL 30 MG PO CPEP: 90.0000 mg | ORAL_CAPSULE | Freq: Every day | ORAL | 1 refills | Status: DC

## 2022-09-09 MED ORDER — ARIPIPRAZOLE 30 MG PO TABS: 30.0000 mg | ORAL_TABLET | Freq: Every day | ORAL | 1 refills | Status: DC

## 2022-09-09 NOTE — Progress Notes (Signed)
Virtual Visit via Video Note  I connected with Jeanne [name] on 09/09/22 at  8:45 AM EDT by a video enabled telemedicine application and verified that I am speaking with the correct person using two identifiers.  Location: Patient: home Provider: office   I discussed the limitations of evaluation and management by telemedicine and the availability of in person appointments. The patient expressed understanding and agreed to proceed.  History of Present Illness: Jeanne [name] shares she is doing well. She is going to the gym 3-4x/week for 1 hr. She is working on weight loss. She is eating 1200 calories/day. Jeanne [name] is working with [employer] on their weight loss  and is now 246lbs down from 294 lbs. Jeanne [name] remains active and enjoys the things she does. She is in a women's wisdom group that meets weekly and another class about Jesus's teaching. She is very involved in committees thru the church. Jeanne [name] denies depression, anhedonia, isolation and hopelessness. She denies SI/HI. She denies paranoia, ideas of reference and paranoia. She denies any OCD symptoms. She used to check things and number things and monitor patterns. Jeanne [name] feels her meds are working well and denies SE.     Observations/Objective: Psychiatric Specialty Exam: ROS  Weight 246 lb (111.6 kg).Body mass index is 36.33 kg/m.  General Appearance: Casual and Fairly Groomed  Eye Contact:  Good  Speech:  Clear and Coherent and Normal Rate  Volume:  Normal  Mood:  Euthymic  Affect:  Blunt  Thought Process:  Goal Directed, Linear, and Descriptions of Associations: Intact  Orientation:  Full (Time, Place, and Person)  Thought Content:  Logical  Suicidal Thoughts:  No  Homicidal Thoughts:  No  Memory:  Immediate;   Good  Judgement:  Good  Insight:  Good  Psychomotor Activity:  Normal  Concentration:  Concentration: Good  Recall:  Good  Fund of Knowledge:  Good  Language:  Good  Akathisia:  No  Handed:  Right   AIMS (if indicated):     Assets:  Communication Skills Desire for Improvement Financial Resources/Insurance Housing Leisure Time Resilience Social Support Talents/Skills Transportation Vocational/Educational  ADL's:  Intact  Cognition:  WNL  Sleep:        Assessment and Plan:     09/09/2022    9:00 AM 05/13/2022    8:40 AM 11/19/2021   11:29 AM 06/04/2021    9:06 AM 01/15/2021    9:19 AM  Depression screen PHQ 2/9  Decreased Interest 0 0 0 0 0  Down, Depressed, Hopeless 0 0 0 0 0  PHQ - 2 Score 0 0 0 0 0    Flowsheet Row Video Visit from 09/09/2022 in [facility] Video Visit from 05[facility]13/2022 in [facility] Video Visit from 11/19/2021 in [facility]  C-SSRS RISK CATEGORY No Risk No Risk No Risk          Pt is aware that these meds carry a teratogenic risk. Pt will discuss plan of action if she does or plans to become pregnant in the future.  Status of current problems: stable   Medication management with supportive therapy. Risks and benefits, side effects and alternative treatment options discussed with patient. Pt was given an opportunity to ask questions about medication, illness, and treatment. All current psychiatric medications have been reviewed and discussed with the patient and adjusted as clinically appropriate.  Pt verbalized understanding and verbal consent obtained for treatment.  Meds:  1. Schizoaffective disorder, depressive type (  HCC) - ARIPiprazole (ABILIFY) 30 MG tablet; Take 1 tablet (30 mg total) by mouth daily.  Dispense: 90 tablet; Refill: 1 - buPROPion (WELLBUTRIN XL) 150 MG 24 hr tablet; Take 3 tablets (450 mg total) by mouth daily.  Dispense: 270 tablet; Refill: 1 - DULoxetine (CYMBALTA) 30 MG capsule; Take 3 capsules (90 mg total) by mouth daily.  Dispense: 270 capsule; Refill: 1 - QUEtiapine (SEROQUEL) 300 MG tablet; Take 2 tablets  (600 mg total) by mouth at bedtime.  Dispense: 180 tablet; Refill: 1  2. Obsessive-compulsive disorder, unspecified type - DULoxetine (CYMBALTA) 30 MG capsule; Take 3 capsules (90 mg total) by mouth daily.  Dispense: 270 capsule; Refill: 1     Labs: were drawn at her PCP - [facility]. She had an EKG and labs done in [facility] 2024. I will request records because they are not on Care Everywhere.     Therapy: brief supportive therapy provided.   Collaboration of Care: Other referral 1-800- quitnow for smoking cessation.   Patient/Guardian was advised Release of Information must be obtained prior to any record release in order to collaborate their care with an outside provider. Patient/Guardian was advised if they have not already done so to contact the registration department to sign all necessary forms in order for [name] to release information regarding their care.   Consent: Patient/Guardian gives verbal consent for treatment and assignment of benefits for services provided during this visit. Patient/Guardian expressed understanding and agreed to proceed.    Follow Up Instructions: Follow up in 5-6 months or sooner if needed with a new psychiatrist.  Patient informed that I am leaving [facility] in 10/2022 and I relayed that they will be getting a new provider after that. Patient verbalized understanding and agreed with the plan.     I discussed the assessment and treatment plan with the patient. The patient was provided an opportunity to ask questions and all were answered. The patient agreed with the plan and demonstrated an understanding of the instructions.   The patient was advised to call back or seek an in-person evaluation if the symptoms worsen or if the condition fails to improve as anticipated.  I provided 16 minutes of non-face-to-face time during this encounter.   [name], MD

## 2022-09-17 ENCOUNTER — Telehealth (HOSPITAL_COMMUNITY): Payer: Self-pay | Admitting: Psychiatry

## 2022-09-17 ENCOUNTER — Ambulatory Visit (HOSPITAL_COMMUNITY)
Admission: RE | Admit: 2022-09-17 | Discharge: 2022-09-17 | Disposition: A | Discharge status: Home / Self Care | Payer: Medicare Other | Source: Ambulatory Visit | Attending: Vascular Surgery | Admitting: Vascular Surgery

## 2022-09-17 DIAGNOSIS — Z86718 Personal history of other venous thrombosis and embolism: Secondary | ICD-10-CM | POA: Insufficient documentation

## 2022-09-21 ENCOUNTER — Ambulatory Visit: Payer: Medicare Other | Admitting: Physician Assistant

## 2022-09-21 VITALS — BP 104/69 | HR 74 | Temp 97.6°F | Wt 250.0 lb

## 2022-09-21 DIAGNOSIS — I872 Venous insufficiency (chronic) (peripheral): Secondary | ICD-10-CM

## 2022-09-21 NOTE — Progress Notes (Signed)
[facility] [place]   Reason for referral: Swollen left thigh with erythema  History of Present Illness  Jeanne [name] is a 62 y.o. female who presents with chief complaint: swollen leg.  Patient notes, onset of swelling 6 months ago, associated with prolonged sitting and standing.  The patient has had no history of DVT, no history of varicose vein, no history of venous stasis ulcers, no history of  Lymphedema and positive dark streaks history of skin changes in lower legs.  There is unknown family history of venous disorders.  The patient has not used compression stockings in the past.  She states the first episode of erythema and thigh edema was 6 months ago.  The last and second episodes was 3 weeks ago.  She denies claudication or rest pain.   No present wounds or history of non healing wounds.  She reports daily swelling left LE.    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   Diabetes mellitus, type II (HCC)    Difficult airway for intubation 03/13/2019   Difficult intubation 07/09/2013   Glidescope used, see Anesthesia note   DVT (deep venous thrombosis) (HCC) 2014   LEFT LEG   Gastritis    Glaucoma    Heart murmur    History of blood clots    Hyperlipidemia    Hypertension    Hypokalemia 03/05/2013   Kidney disease    stage ll   Neuromuscular disorder (HCC)    " NEUROPATHY IN  MY HANDS"   Obsessive-compulsive disorder    Osteoarthritis    Rheumatoid arthritis (HCC)    Rheumatoid arthritis (HCC) 2016   Schizo-affective psychosis (HCC)    Schizoaffective disorder (HCC)    Scleritis of both eyes    Sleep  apnea    Bipap   Suppurative hidradenitis    Undifferentiated connective tissue disease (HCC)     Past Surgical History:  Procedure Laterality Date   BUNIONECTOMY     CERVICAL CONIZATION W/BX     COLONOSCOPY WITH PROPOFOL N/A 03/13/2019   Procedure: COLONOSCOPY WITH PROPOFOL;  Surgeon: Sherrilyn Rist, MD;  Location: [facility];  Service: Gastroenterology;  Laterality: N/A;   LAPAROSCOPIC ADRENALECTOMY Left 07/09/2013   Procedure: LAPAROSCOPIC LEFT  ADRENALECTOMY;  Surgeon: Velora Heckler, MD;  Location: [facility];  Service: General;  Laterality: Left;   POLYPECTOMY  03/13/2019   Procedure: POLYPECTOMY;  Surgeon: Sherrilyn Rist, MD;  Location: [facility];  Service: Gastroenterology;;   sweat gland removal  1975    Social History   Socioeconomic History   Marital status: Single    Spouse name: Not on file   Number of children: 0   Years of education: college   Highest education level: Not on file  Occupational History   Occupation: Retired  Tobacco Use   Smoking status: Every Day    Packs/day: 0.50    Years: 25.00    Additional pack years: 0.00    Total pack years: 12.50    Types: Cigarettes   Smokeless tobacco: Never   Tobacco comments:  Slowly working thru it on her own  Vaping Use   Vaping Use: Never used  Substance and Sexual Activity   Alcohol use: Yes    Alcohol/week: 0.0 standard drinks of alcohol    Comment: " drink just on weekends " Last drink was in the spring of 2016   Drug use: No   Sexual activity: Not Currently  Other Topics Concern   Not on file  Social History Narrative   Pt is living in [place] with mother. Born and raised in [place] by parents. Childhood was fine. Pt has 3 older sisters. Pt has a law degree. 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 Pt is currently on disability for mental health issues. Single, no kids.    Social Determinants of Health   Financial Resource Strain: Not on file  Food Insecurity: Not on file   Transportation Needs: Not on file  Physical Activity: Not on file  Stress: Not on file  Social Connections: Not on file  Intimate Partner Violence: Not on file    Family History  Problem Relation Age of Onset   Cancer Maternal Aunt        breast ca   Breast cancer Maternal Aunt    Heart attack Maternal Grandmother 54   Heart attack Maternal Grandfather 48   Hypertension Sister    Hypertension Sister    Diabetes Mother    Hypertension Mother    Hyperlipidemia Mother    Stroke Mother    Depression Mother    Eating disorder Mother    Hypertension Father    Seizures Maternal Uncle    Alcohol abuse Neg Hx    Anxiety disorder Neg Hx    Bipolar disorder Neg Hx    Drug abuse Neg Hx    Schizophrenia Neg Hx    Colon cancer Neg Hx    Esophageal cancer Neg Hx    Stomach cancer Neg Hx    Rectal cancer Neg Hx     Current Outpatient Medications on File Prior to Visit  Medication Sig Dispense Refill   amLODipine (NORVASC) 5 MG tablet Take 5 mg by mouth every morning.      ARIPiprazole (ABILIFY) 30 MG tablet Take 1 tablet (30 mg total) by mouth daily. 90 tablet 1   aspirin EC 81 MG tablet Take 162 mg by mouth daily.     atorvastatin (LIPITOR) 20 MG tablet Take 1 tablet by mouth daily.     buPROPion (WELLBUTRIN XL) 150 MG 24 hr tablet Take 3 tablets (450 mg total) by mouth daily. 270 tablet 1   Coenzyme Q10 (COQ-10) 150 MG CAPS Take 300 mg by mouth daily.     DULoxetine (CYMBALTA) 30 MG capsule Take 3 capsules (90 mg total) by mouth daily. 270 capsule 1   Famotidine (PEPCID AC PO) Take 20 mg by mouth daily.     furosemide (LASIX) 40 MG tablet Take 40 mg by mouth.     glucosamine-chondroitin 500-400 MG tablet Take 2 tablets by mouth every morning.     inFLIXimab-abda (RENFLEXIS IV) Inject into the vein.     inFLIXimab-dyyb 3 mg/kg in sodium chloride 0.9 % Inject 3 mg/kg into the vein every 8 (eight) weeks.     Krill Oil 300 MG CAPS Take 300 mg by mouth daily.     levobunolol  (BETAGAN) 0.5 % ophthalmic solution Place 1 drop into both eyes every morning.      Multiple Vitamin (MULTIVITAMIN WITH MINERALS) TABS tablet Take  1 tablet by mouth daily.     oxyCODONE-acetaminophen (PERCOCET/ROXICET) 5-325 MG tablet Take 1-2 tablets by mouth every 4 (four) hours as needed for severe pain.     PRESCRIPTION MEDICATION Inhale 1 application into the lungs at bedtime. BiPAP setting 8/12     QUEtiapine (SEROQUEL) 300 MG tablet Take 2 tablets (600 mg total) by mouth at bedtime. 180 tablet 1   tirzepatide (MOUNJARO) 7.5 MG/0.5ML Pen Inject 7.5 mg into the skin once a week. 6 mL 1   Turmeric POWD Take 1,500 mcg by mouth daily at 12 noon.      verapamil (CALAN-SR) 180 MG CR tablet Take 1 tablet (180 mg total) by mouth daily. 30 tablet 1   No current facility-administered medications on file prior to visit.    Allergies as of 09/21/2022 - Review Complete 09/21/2022  Allergen Reaction Noted   Ace inhibitors Anaphylaxis 06/10/2012   Arava [leflunomide] Other (See Comments) 08/04/2015   Travatan [travoprost] Other (See Comments) 06/10/2012   Methotrexate Other (See Comments) 04/08/2021     ROS:   General:  No weight loss, Fever, chills  HEENT: No recent headaches, no nasal bleeding, no visual changes, no sore throat  Neurologic: No dizziness, blackouts, seizures. No recent symptoms of stroke or mini- stroke. No recent episodes of slurred speech, or temporary blindness.  Cardiac: No recent episodes of chest pain/pressure, no shortness of breath at rest.  No shortness of breath with exertion.  Denies history of atrial fibrillation or irregular heartbeat  Vascular: No history of rest pain in feet.  No history of claudication.  No history of non-healing ulcer, No history of DVT   Pulmonary: No home oxygen, no productive cough, no hemoptysis,  No asthma or wheezing  Musculoskeletal:  [ ]  Arthritis, [ ]  Low back pain,  [ ]  Joint pain  Hematologic:No history of hypercoagulable  state.  No history of easy bleeding.  No history of anemia  Gastrointestinal: No hematochezia or melena,  No gastroesophageal reflux, no trouble swallowing  Urinary: [ ]  chronic Kidney disease, [ ]  on HD - [ ]  MWF or [ ]  TTHS, [ ]  Burning with urination, [ ]  Frequent urination, [ ]  Difficulty urinating;   Skin: No rashes  Psychological: No history of anxiety,  No history of depression  Physical Examination  Vitals:   09/21/22 0840  BP: 104/69  Pulse: 74  Temp: 97.6 F (36.4 C)  TempSrc: Temporal  SpO2: 96%  Weight: 250 lb (113.4 kg)    Body mass index is 36.92 kg/m.  General:  Alert and oriented, no acute distress HEENT: Normal Neck: No bruit or JVD Pulmonary: Clear to auscultation bilaterally Cardiac: Regular Rate and Rhythm without murmur Abdomen: Soft, non-tender, non-distended, no mass, no scars Skin: No rash Extremity Pulses:  2+ radial, B femoral, left LE dorsalis pedis,  pulse Musculoskeletal: No deformity or edema  Neurologic: Upper and lower extremity motor 5/5 and symmetric  DATA: +--------------+---------+------+-----------+------------+----------------+   LEFT         Reflux NoRefluxReflux TimeDiameter cmsComments                                  Yes                                            +--------------+---------+------+-----------+------------+----------------+  CFV                    yes   >1 second                                +--------------+---------+------+-----------+------------+----------------+   FV mid        no                                                       +--------------+---------+------+-----------+------------+----------------+   Popliteal    no                                                       +--------------+---------+------+-----------+------------+----------------+   GSV at SFJ              yes    >500 ms      0.58                        +--------------+---------+------+-----------+------------+----------------+   GSV prox thigh          yes    >500 ms      0.71    chronic  thrombus  +--------------+---------+------+-----------+------------+----------------+   GSV mid thigh           yes    >500 ms      0.92    out of fascia      +--------------+---------+------+-----------+------------+----------------+   GSV dist thigh          yes    >500 ms      0.21                       +--------------+---------+------+-----------+------------+----------------+   GSV at knee   no                            0.23                       +--------------+---------+------+-----------+------------+----------------+   GSV prox calf           yes    >500 ms      0.37                       +--------------+---------+------+-----------+------------+----------------+   GSV mid calf                                        NV                 +--------------+---------+------+-----------+------------+----------------+   SSV Pop Fossa no                                    NV                 +--------------+---------+------+-----------+------------+----------------+   SSV prox calf no  too small          +--------------+---------+------+-----------+------------+----------------+   SSV mid calf  no        yes                 0.21                       +--------------+---------+------+-----------+------------+----------------+   AASV O        no                                    NV                 +--------------+---------+------+-----------+------------+----------------+   AASV p        no                            0.16                       +--------------+---------+------+-----------+------------+----------------+          Summary:  Left:  - No evidence of deep vein thrombosis seen in the left lower extremity,   from the common femoral through the popliteal veins.  - Color duplex evaluation of the left lower extremity shows there is  thrombus in the proximal greater saphenous vein leaving fascia in the mid  thigh then extending into the mid calf area.  - Venous reflux is noted in the left common femoral vein.  - Venous reflux is noted in the left sapheno-femoral junction.  - Venous reflux is noted in the left greater saphenous vein in the thigh.  - Venous reflux is noted in the left greater saphenous vein in the calf.  - Venous reflux is noted in the left short saphenous vein.      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  She will continue to be active, water therapy and elevation when at rest.  I will have her follow up in our vein clinic for consideration of laser ablation of the left GSV.  She was fitted for thigh high 20-30 mm hg compression today. To be worn daily.    Venous reflux is noted in the left common femoral vein.  - Venous reflux is noted in the left sapheno-femoral junction.  - Venous reflux is noted in the left greater saphenous vein in the thigh.  - Venous reflux is noted in the left greater saphenous vein in the calf.  - Venous reflux is noted in the left short saphenous vein.  The SFJ and proximal GSV are > 0.4 cm in size.    Mosetta Pigeon PA-C [facility] [place] Office: [phone]  MD in clinic [place]

## 2022-10-06 ENCOUNTER — Ambulatory Visit (INDEPENDENT_AMBULATORY_CARE_PROVIDER_SITE_OTHER): Payer: Medicare Other | Admitting: Psychology

## 2022-10-06 DIAGNOSIS — F331 Major depressive disorder, recurrent, moderate: Secondary | ICD-10-CM | POA: Diagnosis not present

## 2022-10-06 NOTE — Progress Notes (Signed)
[name] Counselor Initial Adult Exam  Name: Jeanne [name] Date: 10/06/2022 MRN: [id number] DOB: January 01, 1961 PCP: [name], MD  Time spent: 2:00pm - 2:50pm   50 minutes  Guardian/Payee:  n/a    Paperwork requested: No   Reason for Visit /Presenting Problem: Pt present for face-to-face initial assessment update via video.  Pt consents to telehealth video session and is aware of limitations of virtual sessions. Location of pt: home Location of therapist: home office.  Pt continues to have the goal to maintain mental health stability.   Pt states she tends to "get lost in her head at times".  Pt states she "knows how bad she can get" and wants to avoid that.  Pt has significant psychiatric history.   When pt starts to get depressed she tends to sleep and eat more.  Her affect becomes more blunt.  Pt has not had hallucinations in many years.  Pt states her "OCD ramps up" and she tends to ruminate more.   Pt talked about issues with her sisters.  Her sister Jeanne [name] lit into her which was upsetting to pt.  Pt made a decision to not attend the upcoming family reunion as a way to emotionally take care of herself.   Pt is very conflict avoidant and has trouble setting boundaries.  Worked on healthy boundary setting.   Reviewed pt's treatment plan for annual update.   Updated treatment plan and IA.   Pt participated in setting treatment goals.  Pt's goal is to maintain stability in her mental health.  Plan to continue to meet every month.    Mental Status Exam: Appearance:   Casual     Behavior:  Appropriate  Motor:  Normal  Speech/Language:   Normal Rate  Affect:  Appropriate  Mood:  normal  Thought process:  normal  Thought content:    WNL  Sensory/Perceptual disturbances:    WNL  Orientation:  oriented to person, place, time/date, and situation  Attention:  Good  Concentration:  Good  Memory:  WNL  Fund of knowledge:   Good  Insight:    Good  Judgment:   Good   Impulse Control:  Good    Reported Symptoms:  stress  Risk Assessment: Danger to Self:  No Self-injurious Behavior: No Danger to Others: No Duty to Warn:no Physical Aggression / Violence:No  Access to Firearms a concern: No  Gang Involvement:No  Patient / guardian was educated about steps to take if suicide or homicide risk level increases between visits: n/a While future psychiatric events cannot be accurately predicted, the patient does not currently require acute inpatient psychiatric care and does not currently meet [facility] involuntary commitment criteria.  Substance Abuse History: Current substance abuse: No     Past Psychiatric History:   Previous psychological history is significant for depression Outpatient Providers:pt was in therapy in the past with Colen Darling. History of Psych Hospitalization: Yes  Psychological Testing:  n/a    Pt has been diagnosed with Major depression, schizoaffactive disorder depressed type and OCD.  Pt sees a psychiatrist at [facility]).  She is prescribed Abilify, Welbutrin, Cymbalta, and Seroquil.  Pt feels like this is a good and stable medication regimin for her.    In 1999-2004 pt was psychiatrically hospitalized about 5 times for SI.   Pt has not been hospitalized since 2004.    Abuse History:  Victim of: No.,  n/a    Report needed: No. Victim of Neglect:No. Perpetrator  of  n/a   Witness / Exposure to Domestic Violence: No   Protective Services Involvement: No  Witness to [employer] Violence:  No   Family History:  Family History  Problem Relation Age of Onset   Cancer Maternal Aunt        breast ca   Breast cancer Maternal Aunt    Heart attack Maternal Grandmother Oct 26, 2052   Heart attack Maternal Grandfather 10/26/60   Hypertension Sister    Hypertension Sister    Diabetes Mother    Hypertension Mother    Hyperlipidemia Mother    Stroke Mother    Depression Mother    Eating disorder Mother     Hypertension Father    Seizures Maternal Uncle    Alcohol abuse Neg Hx    Anxiety disorder Neg Hx    Bipolar disorder Neg Hx    Drug abuse Neg Hx    Schizophrenia Neg Hx    Colon cancer Neg Hx    Esophageal cancer Neg Hx    Stomach cancer Neg Hx    Rectal cancer Neg Hx     Living situation: the patient lives alone  Pt grew up with both parents and 3 older sisters.   Pt's parents were share croppers until they were in their 10-27-22.   Family history of mental health issues: none. No family substance abuse or childhood abuse.   Pt states that overall she had a good childhood.   Her father worked in a factory and her mother worked as a Social research officer, government.   She felt loved and cared for.   Pt's first depressive episode occurred when she was 62 years old.   Both of pt's parents are deceased.   Pt's mother died in Oct 27, 2015.    Sexual Orientation: Straight  Relationship Status: single  Name of spouse / other:n/a If a parent, number of children / ages:none  Support Systems: friends  Financial Stress:  No   Income/Employment/Disability: Employment  Financial planner: No   Educational History: Education: Risk manager: Protestant  Any cultural differences that may affect / interfere with treatment:  not applicable   Recreation/Hobbies: reading and writing.  Stressors: Other: mental health, family at times.    Strengths: Supportive Relationships, Church, Spirituality, Hopefulness, Journalist, newspaper, and Able to Communicate Effectively  Barriers:  n/a   Legal History: Pending legal issue / charges: The patient has no significant history of legal issues. 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   Diabetes mellitus, type II (HCC)    Difficult airway for intubation 03/13/2019   Difficult intubation 07/09/2013   Glidescope used, see Anesthesia note   DVT (deep venous thrombosis) (HCC) 2014   LEFT LEG   Gastritis    Glaucoma    Heart murmur    History of blood clots    Hyperlipidemia    Hypertension    Hypokalemia 03/05/2013   Kidney disease    stage ll   Neuromuscular disorder (HCC)    " NEUROPATHY IN  MY HANDS"  Obsessive-compulsive disorder    Osteoarthritis    Rheumatoid arthritis (HCC)    Rheumatoid arthritis (HCC) 2016   Schizo-affective psychosis (HCC)    Schizoaffective disorder (HCC)    Scleritis of both eyes    Sleep apnea    Bipap   Suppurative hidradenitis    Undifferentiated connective tissue disease (HCC)     Past Surgical History:  Procedure Laterality Date   BUNIONECTOMY     CERVICAL CONIZATION W/BX     COLONOSCOPY WITH PROPOFOL N/A 03/13/2019   Procedure: COLONOSCOPY WITH PROPOFOL;  Surgeon: Sherrilyn Rist, MD;  Location: [facility];  Service: Gastroenterology;  Laterality: N/A;   LAPAROSCOPIC ADRENALECTOMY Left 07/09/2013   Procedure: LAPAROSCOPIC LEFT  ADRENALECTOMY;  Surgeon: Velora Heckler, MD;  Location: [facility];  Service: General;  Laterality: Left;   POLYPECTOMY  03/13/2019   Procedure: POLYPECTOMY;  Surgeon: Sherrilyn Rist, MD;  Location: [facility];  Service: Gastroenterology;;   sweat gland removal  1975    Medications: Current Outpatient Medications  Medication Sig Dispense Refill   amLODipine (NORVASC) 5 MG tablet Take 5 mg by mouth every morning.      ARIPiprazole (ABILIFY) 30 MG tablet Take 1 tablet (30 mg total) by mouth daily. 90 tablet 1   aspirin EC 81 MG tablet Take 162 mg by mouth daily.     atorvastatin (LIPITOR) 20 MG tablet Take 1 tablet by mouth  daily.     buPROPion (WELLBUTRIN XL) 150 MG 24 hr tablet Take 3 tablets (450 mg total) by mouth daily. 270 tablet 1   Coenzyme Q10 (COQ-10) 150 MG CAPS Take 300 mg by mouth daily.     DULoxetine (CYMBALTA) 30 MG capsule Take 3 capsules (90 mg total) by mouth daily. 270 capsule 1   Famotidine (PEPCID AC PO) Take 20 mg by mouth daily.     furosemide (LASIX) 40 MG tablet Take 40 mg by mouth.     glucosamine-chondroitin 500-400 MG tablet Take 2 tablets by mouth every morning.     inFLIXimab-abda (RENFLEXIS IV) Inject into the vein.     inFLIXimab-dyyb 3 mg/kg in sodium chloride 0.9 % Inject 3 mg/kg into the vein every 8 (eight) weeks.     Krill Oil 300 MG CAPS Take 300 mg by mouth daily.     levobunolol (BETAGAN) 0.5 % ophthalmic solution Place 1 drop into both eyes every morning.      Multiple Vitamin (MULTIVITAMIN WITH MINERALS) TABS tablet Take 1 tablet by mouth daily.     oxyCODONE-acetaminophen (PERCOCET/ROXICET) 5-325 MG tablet Take 1-2 tablets by mouth every 4 (four) hours as needed for severe pain.     PRESCRIPTION MEDICATION Inhale 1 application into the lungs at bedtime. BiPAP setting 8/12     QUEtiapine (SEROQUEL) 300 MG tablet Take 2 tablets (600 mg total) by mouth at bedtime. 180 tablet 1   tirzepatide (MOUNJARO) 7.5 MG/0.5ML Pen Inject 7.5 mg into the skin once a week. 6 mL 1   Turmeric POWD Take 1,500 mcg by mouth daily at 12 noon.      verapamil (CALAN-SR) 180 MG CR tablet Take 1 tablet (180 mg total) by mouth daily. 30 tablet 1   No current facility-administered medications for this visit.    Allergies  Allergen Reactions   Ace Inhibitors Anaphylaxis   Arava [Leflunomide] Other (See Comments)    Nephritis and kidney clearance issues   Travatan [Travoprost] Other (See Comments)    Red eyes, irritation  Methotrexate Other (See Comments)    Diagnoses:  F33.1  Plan of Care: Recommend ongoing therapy.   Pt participated in setting treatment goals and agrees with treatment  plan.   Plan to continue to meet monthly.    Treatment Plan  (Treatment Plan Target Date:  10/06/2023) Client Abilities/Strengths  Pt is bright, engaging, and motivated for therapy.   Client Treatment Preferences  Individual therapy.  Client Statement of Needs  Improve coping skills.  Symptoms  Depressed or irritable mood.  Problems Addressed  Unipolar Depression Goals 1. Alleviate depressive symptoms and return to previous level of effective functioning. 2. Appropriately grieve the loss in order to normalize mood and to return to previously adaptive level of functioning. Objective Learn and implement behavioral strategies to overcome depression. Target Date: 2023-10-06 Frequency: Monthly  Progress: 55 Modality: individual  Related Interventions Engage the client in "behavioral activation," increasing his/her activity level and contact with sources of reward, while identifying processes that inhibit activation.  Use behavioral techniques such as instruction, rehearsal, role-playing, role reversal, as needed, to facilitate activity in the client's daily life; reinforce success. Assist the client in developing skills that increase the likelihood of deriving pleasure from behavioral activation (e.g., assertiveness skills, developing an exercise plan, less internal/more external focus, increased social involvement); reinforce success. Objective Identify important people in life, past and present, and describe the quality, good and poor, of those relationships. Target Date: 2023-10-06 Frequency: Monthly  Progress: 55 Modality: individual  Related Interventions Conduct Interpersonal Therapy beginning with the assessment of the client's "interpersonal inventory" of important past and present relationships; develop a case formulation linking depression to grief, interpersonal role disputes, role transitions, and/or interpersonal deficits). Objective Learn and implement problem-solving and  decision-making skills. Target Date: 2023-10-06 Frequency: Monthly  Progress: 55 Modality: individual  Related Interventions Conduct Problem-Solving Therapy using techniques such as psychoeducation, modeling, and role-playing to teach client problem-solving skills (i.e., defining a problem specifically, generating possible solutions, evaluating the pros and cons of each solution, selecting and implementing a plan of action, evaluating the efficacy of the plan, accepting or revising the plan); role-play application of the problem-solving skill to a real life issue. Encourage in the client the development of a positive problem orientation in which problems and solving them are viewed as a natural part of life and not something to be feared, despaired, or avoided. 3. Develop healthy interpersonal relationships that lead to the alleviation and help prevent the relapse of depression. 4. Develop healthy thinking patterns and beliefs about self, others, and the world that lead to the alleviation and help prevent the relapse of depression. 5. Recognize, accept, and cope with feelings of depression. Diagnosis F33.1  Conditions For Discharge Achievement of treatment goals and objectives    Salomon Fick, LCSW

## 2022-10-26 ENCOUNTER — Other Ambulatory Visit (HOSPITAL_COMMUNITY): Payer: Self-pay

## 2022-10-26 MED ORDER — MOUNJARO 7.5 MG/0.5ML ~~LOC~~ SOAJ: 7.5000 mg | SUBCUTANEOUS | 1 refills | Status: DC

## 2022-10-28 ENCOUNTER — Telehealth (HOSPITAL_COMMUNITY): Payer: Medicare Other | Admitting: Psychiatry

## 2022-11-01 ENCOUNTER — Other Ambulatory Visit (HOSPITAL_COMMUNITY): Payer: Self-pay

## 2022-11-02 ENCOUNTER — Other Ambulatory Visit (HOSPITAL_COMMUNITY): Payer: Self-pay

## 2022-11-10 ENCOUNTER — Ambulatory Visit: Payer: Medicare Other | Admitting: Psychology

## 2022-11-10 DIAGNOSIS — F331 Major depressive disorder, recurrent, moderate: Secondary | ICD-10-CM | POA: Diagnosis not present

## 2022-11-10 NOTE — Progress Notes (Signed)
[facility] Counselor/Therapist Progress Note  Patient ID: Jeanne [name], MRN: [id number],    Date: 11/10/2022  Time Spent: 2:00pm-2:50pm   50 minutes   Treatment Type: Individual Therapy  Reported Symptoms: stress  Mental Status Exam: Appearance:  Casual     Behavior: Appropriate  Motor: Normal  Speech/Language:  Normal Rate  Affect: Appropriate  Mood: normal  Thought process: normal  Thought content:   WNL  Sensory/Perceptual disturbances:   WNL  Orientation: oriented to person, place, time/date, and situation  Attention: Good  Concentration: Good  Memory: WNL  Fund of knowledge:  Good  Insight:   Good  Judgment:  Good  Impulse Control: Good   Risk Assessment: Danger to Self:  No Self-injurious Behavior: No Danger to Others: No Duty to Warn:no Physical Aggression / Violence:No  Access to Firearms a concern: No  Gang Involvement:No   Subjective: Pt present for face-to-face individual therapy in person.    Pt talked about her family reuinion.  She did not attend it and felt good about her decision. Pt talked about her sisters.  Two of her sisters give her money monthly to suppliment her income.  One of her sisters [name] has threatened to withdraw the money if pt does not invest it in a CD.   Pt feels very controlled by [name] and wants to tell her how she feels but is anxious about it.   Helped pt process her feelings and relationship dynamics.   Addressed how pt can effectively communicate with [name].  Provided supportive therapy.    Interventions: Cognitive Behavioral Therapy and Insight-Oriented  Diagnosis:  F33.1  Plan of Care: Recommend ongoing therapy.   Pt participated in setting treatment goals and agrees with treatment plan.   Plan to continue to meet monthly.    Treatment Plan  (Treatment Plan Target Date:  10/06/2023) Client Abilities/Strengths  Pt is bright, engaging, and motivated for therapy.   Client Treatment Preferences   Individual therapy.  Client Statement of Needs  Improve coping skills.  Symptoms  Depressed or irritable mood.  Problems Addressed  Unipolar Depression Goals 1. Alleviate depressive symptoms and return to previous level of effective functioning. 2. Appropriately grieve the loss in order to normalize mood and to return to previously adaptive level of functioning. Objective Learn and implement behavioral strategies to overcome depression. Target Date: 2023-10-06 Frequency: Monthly  Progress: 55 Modality: individual  Related Interventions Engage the client in "behavioral activation," increasing his/her activity level and contact with sources of reward, while identifying processes that inhibit activation.  Use behavioral techniques such as instruction, rehearsal, role-playing, role reversal, as needed, to facilitate activity in the client's daily life; reinforce success. Assist the client in developing skills that increase the likelihood of deriving pleasure from behavioral activation (e.g., assertiveness skills, developing an exercise plan, less internal/more external focus, increased social involvement); reinforce success. Objective Identify important people in life, past and present, and describe the quality, good and poor, of those relationships. Target Date: 2023-10-06 Frequency: Monthly  Progress: 55 Modality: individual  Related Interventions Conduct Interpersonal Therapy beginning with the assessment of the client's "interpersonal inventory" of important past and present relationships; develop a case formulation linking depression to grief, interpersonal role disputes, role transitions, and/or interpersonal deficits). Objective Learn and implement problem-solving and decision-making skills. Target Date: 2023-10-06 Frequency: Monthly  Progress: 55 Modality: individual  Related Interventions Conduct Problem-Solving Therapy using techniques such as psychoeducation, modeling, and  role-playing to teach client problem-solving skills (i.e., defining a problem specifically,  generating possible solutions, evaluating the pros and cons of each solution, selecting and implementing a plan of action, evaluating the efficacy of the plan, accepting or revising the plan); role-play application of the problem-solving skill to a real life issue. Encourage in the client the development of a positive problem orientation in which problems and solving them are viewed as a natural part of life and not something to be feared, despaired, or avoided. 3. Develop healthy interpersonal relationships that lead to the alleviation and help prevent the relapse of depression. 4. Develop healthy thinking patterns and beliefs about self, others, and the world that lead to the alleviation and help prevent the relapse of depression. 5. Recognize, accept, and cope with feelings of depression. Diagnosis F33.1  Conditions For Discharge Achievement of treatment goals and objectives   Salomon Fick, LCSW

## 2022-11-23 ENCOUNTER — Other Ambulatory Visit: Payer: Self-pay | Admitting: Internal Medicine

## 2022-11-23 DIAGNOSIS — F17200 Nicotine dependence, unspecified, uncomplicated: Secondary | ICD-10-CM

## 2022-11-23 DIAGNOSIS — Z1231 Encounter for screening mammogram for malignant neoplasm of breast: Secondary | ICD-10-CM

## 2022-12-07 ENCOUNTER — Ambulatory Visit (INDEPENDENT_AMBULATORY_CARE_PROVIDER_SITE_OTHER): Payer: Medicare Other | Admitting: Psychology

## 2022-12-07 DIAGNOSIS — F331 Major depressive disorder, recurrent, moderate: Secondary | ICD-10-CM | POA: Diagnosis not present

## 2022-12-07 NOTE — Progress Notes (Signed)
[facility] Counselor/Therapist Progress Note  Patient ID: Jeanne [name], MRN: [id number],    Date: 12/07/2022  Time Spent: 3:00pm-3:50pm   50 minutes   Treatment Type: Individual Therapy  Reported Symptoms: stress  Mental Status Exam: Appearance:  Casual     Behavior: Appropriate  Motor: Normal  Speech/Language:  Normal Rate  Affect: Appropriate  Mood: normal  Thought process: normal  Thought content:   WNL  Sensory/Perceptual disturbances:   WNL  Orientation: oriented to person, place, time/date, and situation  Attention: Good  Concentration: Good  Memory: WNL  Fund of knowledge:  Good  Insight:   Good  Judgment:  Good  Impulse Control: Good   Risk Assessment: Danger to Self:  No Self-injurious Behavior: No Danger to Others: No Duty to Warn:no Physical Aggression / Violence:No  Access to Firearms a concern: No  Gang Involvement:No   Subjective: Pt present for face-to-face individual therapy in person.    Pt talked about her oldest sister May inviting her to a trip to [place] in October.   Pt plans to go but is worried about how their sister [name] will react since she is not invited.  Addressed how pt can communicate with [name] and deal with the expected fallout. Helped pt process her feelings and relationship dynamics.    Pt talked about issues in her [employer]  The clerk of the meetings is going to be leaving and pt is thinking about taking the position if she is nominated.  Addressed pt's concerns and the pros and cons of accepting a nomination. Pt talked about needing a new car bc hers is a 76.   Pt plans to get a much newer car when her car dies.  Pt will need a payment of less than $400 a month.  Pt feels having a dependable car would improve her quality of life bc she would take trips.  Pt hopes this will change her mindset about being stuck.   Pt talked about her sister Asencion Islam who recently had a stroke.  Pt will help transport  her kids to and from school Tuesdays and Thursdays.   Pt talked about her health.  She has plateaued in her weight loss efforts.  She plans to talk to her doctor for guidance.  Provided supportive therapy.    Interventions: Cognitive Behavioral Therapy and Insight-Oriented  Diagnosis:  F33.1  Plan of Care: Recommend ongoing therapy.   Pt participated in setting treatment goals and agrees with treatment plan.   Plan to continue to meet monthly.    Treatment Plan  (Treatment Plan Target Date:  10/06/2023) Client Abilities/Strengths  Pt is bright, engaging, and motivated for therapy.   Client Treatment Preferences  Individual therapy.  Client Statement of Needs  Improve coping skills.  Symptoms  Depressed or irritable mood.  Problems Addressed  Unipolar Depression Goals 1. Alleviate depressive symptoms and return to previous level of effective functioning. 2. Appropriately grieve the loss in order to normalize mood and to return to previously adaptive level of functioning. Objective Learn and implement behavioral strategies to overcome depression. Target Date: 2023-10-06 Frequency: Monthly  Progress: 55 Modality: individual  Related Interventions Engage the client in "behavioral activation," increasing his/her activity level and contact with sources of reward, while identifying processes that inhibit activation.  Use behavioral techniques such as instruction, rehearsal, role-playing, role reversal, as needed, to facilitate activity in the client's daily life; reinforce success. Assist the client in developing skills that increase the likelihood of deriving  pleasure from behavioral activation (e.g., assertiveness skills, developing an exercise plan, less internal/more external focus, increased social involvement); reinforce success. Objective Identify important people in life, past and present, and describe the quality, good and poor, of those relationships. Target Date: 2023-10-06  Frequency: Monthly  Progress: 55 Modality: individual  Related Interventions Conduct Interpersonal Therapy beginning with the assessment of the client's "interpersonal inventory" of important past and present relationships; develop a case formulation linking depression to grief, interpersonal role disputes, role transitions, and/or interpersonal deficits). Objective Learn and implement problem-solving and decision-making skills. Target Date: 2023-10-06 Frequency: Monthly  Progress: 55 Modality: individual  Related Interventions Conduct Problem-Solving Therapy using techniques such as psychoeducation, modeling, and role-playing to teach client problem-solving skills (i.e., defining a problem specifically, generating possible solutions, evaluating the pros and cons of each solution, selecting and implementing a plan of action, evaluating the efficacy of the plan, accepting or revising the plan); role-play application of the problem-solving skill to a real life issue. Encourage in the client the development of a positive problem orientation in which problems and solving them are viewed as a natural part of life and not something to be feared, despaired, or avoided. 3. Develop healthy interpersonal relationships that lead to the alleviation and help prevent the relapse of depression. 4. Develop healthy thinking patterns and beliefs about self, others, and the world that lead to the alleviation and help prevent the relapse of depression. 5. Recognize, accept, and cope with feelings of depression. Diagnosis F33.1  Conditions For Discharge Achievement of treatment goals and objectives   Salomon Fick, LCSW

## 2022-12-08 ENCOUNTER — Ambulatory Visit: Payer: Medicare Other | Admitting: Psychology

## 2022-12-14 ENCOUNTER — Ambulatory Visit
Admission: RE | Admit: 2022-12-14 | Discharge: 2022-12-14 | Disposition: A | Discharge status: Home / Self Care | Payer: Medicare Other | Source: Ambulatory Visit | Attending: Internal Medicine | Admitting: Internal Medicine

## 2022-12-14 DIAGNOSIS — F17200 Nicotine dependence, unspecified, uncomplicated: Secondary | ICD-10-CM

## 2022-12-22 ENCOUNTER — Ambulatory Visit: Payer: Medicare Other | Admitting: Vascular Surgery

## 2022-12-23 ENCOUNTER — Other Ambulatory Visit: Payer: Self-pay | Admitting: Internal Medicine

## 2022-12-23 DIAGNOSIS — K769 Liver disease, unspecified: Secondary | ICD-10-CM

## 2022-12-28 ENCOUNTER — Ambulatory Visit
Admission: RE | Admit: 2022-12-28 | Discharge: 2022-12-28 | Disposition: A | Discharge status: Home / Self Care | Payer: Medicare Other | Source: Ambulatory Visit | Attending: Internal Medicine | Admitting: Internal Medicine

## 2022-12-28 DIAGNOSIS — K769 Liver disease, unspecified: Secondary | ICD-10-CM

## 2023-01-05 ENCOUNTER — Ambulatory Visit (INDEPENDENT_AMBULATORY_CARE_PROVIDER_SITE_OTHER): Payer: Medicare Other | Admitting: Psychology

## 2023-01-05 DIAGNOSIS — F331 Major depressive disorder, recurrent, moderate: Secondary | ICD-10-CM | POA: Diagnosis not present

## 2023-01-05 NOTE — Progress Notes (Signed)
[facility] Counselor/Therapist Progress Note  Patient ID: Carlye Angola, MRN: [id number],    Date: 01/05/2023  Time Spent: 2:00pm-2:50pm   50 minutes   Treatment Type: Individual Therapy  Reported Symptoms: stress  Mental Status Exam: Appearance:  Casual     Behavior: Appropriate  Motor: Normal  Speech/Language:  Normal Rate  Affect: Appropriate  Mood: normal  Thought process: normal  Thought content:   WNL  Sensory/Perceptual disturbances:   WNL  Orientation: oriented to person, place, time/date, and situation  Attention: Good  Concentration: Good  Memory: WNL  Fund of knowledge:  Good  Insight:   Good  Judgment:  Good  Impulse Control: Good   Risk Assessment: Danger to Self:  No Self-injurious Behavior: No Danger to Others: No Duty to Warn:no Physical Aggression / Violence:No  Access to Firearms a concern: No  Gang Involvement:No   Subjective: Pt present for face-to-face individual therapy in person.    Pt talked about an issue in her [facility] meeting group.   There was miscommunication and pt was given feedback that her delivery had an "aggressive tone".  Addressed the interactions and communication issues.   Helped pt problem solve how to "clean it up" with the people involved.  Pt talked about being nominated to be the clerk for a [employer].  Pt turned down the nomination bc most of the committee members do not do much work and the work would fall on her.   Pt would like to have a conversation with the committee about this but is concerned she will be seen as too direct or aggressive bc the culture of her [employer] Meeting group is to keep the peace and not make waves.   Helped pt process her feelings and group dynamics.    Provided supportive therapy.    Interventions: Cognitive Behavioral Therapy and Insight-Oriented  Diagnosis:  F33.1  Plan of Care: Recommend ongoing therapy.   Pt participated in setting treatment goals and agrees  with treatment plan.   Plan to continue to meet monthly.    Treatment Plan  (Treatment Plan Target Date:  10/06/2023) Client Abilities/Strengths  Pt is bright, engaging, and motivated for therapy.   Client Treatment Preferences  Individual therapy.  Client Statement of Needs  Improve coping skills.  Symptoms  Depressed or irritable mood.  Problems Addressed  Unipolar Depression Goals 1. Alleviate depressive symptoms and return to previous level of effective functioning. 2. Appropriately grieve the loss in order to normalize mood and to return to previously adaptive level of functioning. Objective Learn and implement behavioral strategies to overcome depression. Target Date: 2023-10-06 Frequency: Monthly  Progress: 55 Modality: individual  Related Interventions Engage the client in "behavioral activation," increasing his/her activity level and contact with sources of reward, while identifying processes that inhibit activation.  Use behavioral techniques such as instruction, rehearsal, role-playing, role reversal, as needed, to facilitate activity in the client's daily life; reinforce success. Assist the client in developing skills that increase the likelihood of deriving pleasure from behavioral activation (e.g., assertiveness skills, developing an exercise plan, less internal/more external focus, increased social involvement); reinforce success. Objective Identify important people in life, past and present, and describe the quality, good and poor, of those relationships. Target Date: 2023-10-06 Frequency: Monthly  Progress: 55 Modality: individual  Related Interventions Conduct Interpersonal Therapy beginning with the assessment of the client's "interpersonal inventory" of important past and present relationships; develop a case formulation linking depression to grief, interpersonal role disputes, role transitions, and/or  interpersonal deficits). Objective Learn and implement  problem-solving and decision-making skills. Target Date: 2023-10-06 Frequency: Monthly  Progress: 55 Modality: individual  Related Interventions Conduct Problem-Solving Therapy using techniques such as psychoeducation, modeling, and role-playing to teach client problem-solving skills (i.e., defining a problem specifically, generating possible solutions, evaluating the pros and cons of each solution, selecting and implementing a plan of action, evaluating the efficacy of the plan, accepting or revising the plan); role-play application of the problem-solving skill to a real life issue. Encourage in the client the development of a positive problem orientation in which problems and solving them are viewed as a natural part of life and not something to be feared, despaired, or avoided. 3. Develop healthy interpersonal relationships that lead to the alleviation and help prevent the relapse of depression. 4. Develop healthy thinking patterns and beliefs about self, others, and the world that lead to the alleviation and help prevent the relapse of depression. 5. Recognize, accept, and cope with feelings of depression. Diagnosis F33.1  Conditions For Discharge Achievement of treatment goals and objectives   Salomon Fick, LCSW

## 2023-01-20 ENCOUNTER — Other Ambulatory Visit (HOSPITAL_COMMUNITY): Payer: Self-pay

## 2023-01-20 MED ORDER — MOUNJARO 15 MG/0.5ML ~~LOC~~ SOAJ
15.0000 mg | SUBCUTANEOUS | 1 refills | Status: DC
  Filled 2023-01-20: qty 6, 84d supply, fill #0

## 2023-01-26 ENCOUNTER — Other Ambulatory Visit (HOSPITAL_COMMUNITY): Payer: Self-pay

## 2023-01-26 ENCOUNTER — Other Ambulatory Visit: Payer: Self-pay

## 2023-01-26 MED ORDER — MOUNJARO 10 MG/0.5ML ~~LOC~~ SOAJ
10.0000 mg | SUBCUTANEOUS | 1 refills | Status: DC
  Filled 2023-01-26: qty 6, 84d supply, fill #0

## 2023-02-02 ENCOUNTER — Ambulatory Visit (INDEPENDENT_AMBULATORY_CARE_PROVIDER_SITE_OTHER): Payer: Medicare Other | Admitting: Psychology

## 2023-02-02 DIAGNOSIS — F331 Major depressive disorder, recurrent, moderate: Secondary | ICD-10-CM | POA: Diagnosis not present

## 2023-02-02 NOTE — Progress Notes (Signed)
[facility] Counselor/Therapist Progress Note  Patient ID: Jeanne [name], MRN: [id number],    Date: 02/02/2023  Time Spent: 2:00pm-2:50pm   50 minutes   Treatment Type: Individual Therapy  Reported Symptoms: stress  Mental Status Exam: Appearance:  Casual     Behavior: Appropriate  Motor: Normal  Speech/Language:  Normal Rate  Affect: Appropriate  Mood: normal  Thought process: normal  Thought content:   WNL  Sensory/Perceptual disturbances:   WNL  Orientation: oriented to person, place, time/date, and situation  Attention: Good  Concentration: Good  Memory: WNL  Fund of knowledge:  Good  Insight:   Good  Judgment:  Good  Impulse Control: Good   Risk Assessment: Danger to Self:  No Self-injurious Behavior: No Danger to Others: No Duty to Warn:no Physical Aggression / Violence:No  Access to Firearms a concern: No  Gang Involvement:No   Subjective: Pt present for face-to-face individual therapy via video.  Pt consents to telehealth video session and is aware of limitations and benefits of virtual sessions.  Location of pt: home Location of therapist: home office.  Pt talked about traveling to [facility] next week with her older sister.  Pt is looking forward to the trip. Pt talked about issues in her [employer] group.   Pt agreed to be the clerk and was promised support so she would not have to do all the work of the group.   Pt had lunch with the preacher.   Pt states it was awkward at times.   Pt talked about her nephew who lives with her.  Pt wants to talk with him about ground rules.  She wants to ask him for rent and for him to plan to move out in 6 months.  He also needs to contribute to household chores.  Pt states her nephew has anger management issues and can be scary when he is angry. Pt is nervous about talking with him but knows she needs to so she does not continue to enable his behavior.  Worked with pt on a communication plan and  healthy boundary setting.  Provided supportive therapy.    Interventions: Cognitive Behavioral Therapy and Insight-Oriented  Diagnosis:  F33.1  Plan of Care: Recommend ongoing therapy.   Pt participated in setting treatment goals and agrees with treatment plan.   Plan to continue to meet monthly.    Treatment Plan  (Treatment Plan Target Date:  10/06/2023) Client Abilities/Strengths  Pt is bright, engaging, and motivated for therapy.   Client Treatment Preferences  Individual therapy.  Client Statement of Needs  Improve coping skills.  Symptoms  Depressed or irritable mood.  Problems Addressed  Unipolar Depression Goals 1. Alleviate depressive symptoms and return to previous level of effective functioning. 2. Appropriately grieve the loss in order to normalize mood and to return to previously adaptive level of functioning. Objective Learn and implement behavioral strategies to overcome depression. Target Date: 2023-10-06 Frequency: Monthly  Progress: 55 Modality: individual  Related Interventions Engage the client in "behavioral activation," increasing his/her activity level and contact with sources of reward, while identifying processes that inhibit activation.  Use behavioral techniques such as instruction, rehearsal, role-playing, role reversal, as needed, to facilitate activity in the client's daily life; reinforce success. Assist the client in developing skills that increase the likelihood of deriving pleasure from behavioral activation (e.g., assertiveness skills, developing an exercise plan, less internal/more external focus, increased social involvement); reinforce success. Objective Identify important people in life, past and present, and describe  the quality, good and poor, of those relationships. Target Date: 2023-10-06 Frequency: Monthly  Progress: 55 Modality: individual  Related Interventions Conduct Interpersonal Therapy beginning with the assessment of the client's  "interpersonal inventory" of important past and present relationships; develop a case formulation linking depression to grief, interpersonal role disputes, role transitions, and/or interpersonal deficits). Objective Learn and implement problem-solving and decision-making skills. Target Date: 2023-10-06 Frequency: Monthly  Progress: 55 Modality: individual  Related Interventions Conduct Problem-Solving Therapy using techniques such as psychoeducation, modeling, and role-playing to teach client problem-solving skills (i.e., defining a problem specifically, generating possible solutions, evaluating the pros and cons of each solution, selecting and implementing a plan of action, evaluating the efficacy of the plan, accepting or revising the plan); role-play application of the problem-solving skill to a real life issue. Encourage in the client the development of a positive problem orientation in which problems and solving them are viewed as a natural part of life and not something to be feared, despaired, or avoided. 3. Develop healthy interpersonal relationships that lead to the alleviation and help prevent the relapse of depression. 4. Develop healthy thinking patterns and beliefs about self, others, and the world that lead to the alleviation and help prevent the relapse of depression. 5. Recognize, accept, and cope with feelings of depression. Diagnosis F33.1  Conditions For Discharge Achievement of treatment goals and objectives   Salomon Fick, LCSW

## 2023-03-02 ENCOUNTER — Ambulatory Visit: Payer: Medicare Other | Admitting: Psychology

## 2023-03-02 DIAGNOSIS — F331 Major depressive disorder, recurrent, moderate: Secondary | ICD-10-CM

## 2023-03-02 NOTE — Progress Notes (Signed)
[facility] Counselor/Therapist Progress Note  Patient ID: Jeanne [name], MRN: [id number],    Date: 03/02/2023  Time Spent: 2:00pm-2:55pm   55 minutes   Treatment Type: Individual Therapy  Reported Symptoms: stress  Mental Status Exam: Appearance:  Casual     Behavior: Appropriate  Motor: Normal  Speech/Language:  Normal Rate  Affect: Appropriate  Mood: normal  Thought process: normal  Thought content:   WNL  Sensory/Perceptual disturbances:   WNL  Orientation: oriented to person, place, time/date, and situation  Attention: Good  Concentration: Good  Memory: WNL  Fund of knowledge:  Good  Insight:   Good  Judgment:  Good  Impulse Control: Good   Risk Assessment: Danger to Self:  No Self-injurious Behavior: No Danger to Others: No Duty to Warn:no Physical Aggression / Violence:No  Access to Firearms a concern: No  Gang Involvement:No   Subjective: Pt present for face-to-face individual therapy in person. Pt talked about the "state of her spirit".   She is very disappointed about the presidential election results.  She has had a very difficult couple of weeks.   She has had some difficult interactions at her [employer] meetings that were hurtful and racist.   Addressed the interactions and helped pt process her feelings and relationship dynamics.   Pt is giving herself a hard time about being tearful at the [employer] and worrying that she did not say the right things.   Worked with pt on self compassion.   Pt talked about her nephew Jeanne [name] who lives with her.   Worked with pt on a communication plan and healthy boundary setting.  Pt will give Josh 3 months to move out.  Provided supportive therapy.    Interventions: Cognitive Behavioral Therapy and Insight-Oriented  Diagnosis:  F33.1  Plan of Care: Recommend ongoing therapy.   Pt participated in setting treatment goals and agrees with treatment plan.   Plan to continue to meet monthly.    Treatment  Plan  (Treatment Plan Target Date:  10/06/2023) Client Abilities/Strengths  Pt is bright, engaging, and motivated for therapy.   Client Treatment Preferences  Individual therapy.  Client Statement of Needs  Improve coping skills.  Symptoms  Depressed or irritable mood.  Problems Addressed  Unipolar Depression Goals 1. Alleviate depressive symptoms and return to previous level of effective functioning. 2. Appropriately grieve the loss in order to normalize mood and to return to previously adaptive level of functioning. Objective Learn and implement behavioral strategies to overcome depression. Target Date: 2023-10-06 Frequency: Monthly  Progress: 55 Modality: individual  Related Interventions Engage the client in "behavioral activation," increasing his/her activity level and contact with sources of reward, while identifying processes that inhibit activation.  Use behavioral techniques such as instruction, rehearsal, role-playing, role reversal, as needed, to facilitate activity in the client's daily life; reinforce success. Assist the client in developing skills that increase the likelihood of deriving pleasure from behavioral activation (e.g., assertiveness skills, developing an exercise plan, less internal/more external focus, increased social involvement); reinforce success. Objective Identify important people in life, past and present, and describe the quality, good and poor, of those relationships. Target Date: 2023-10-06 Frequency: Monthly  Progress: 55 Modality: individual  Related Interventions Conduct Interpersonal Therapy beginning with the assessment of the client's "interpersonal inventory" of important past and present relationships; develop a case formulation linking depression to grief, interpersonal role disputes, role transitions, and/or interpersonal deficits). Objective Learn and implement problem-solving and decision-making skills. Target Date: 2023-10-06 Frequency:  Monthly  Progress: 55 Modality: individual  Related Interventions Conduct Problem-Solving Therapy using techniques such as psychoeducation, modeling, and role-playing to teach client problem-solving skills (i.e., defining a problem specifically, generating possible solutions, evaluating the pros and cons of each solution, selecting and implementing a plan of action, evaluating the efficacy of the plan, accepting or revising the plan); role-play application of the problem-solving skill to a real life issue. Encourage in the client the development of a positive problem orientation in which problems and solving them are viewed as a natural part of life and not something to be feared, despaired, or avoided. 3. Develop healthy interpersonal relationships that lead to the alleviation and help prevent the relapse of depression. 4. Develop healthy thinking patterns and beliefs about self, others, and the world that lead to the alleviation and help prevent the relapse of depression. 5. Recognize, accept, and cope with feelings of depression. Diagnosis F33.1  Conditions For Discharge Achievement of treatment goals and objectives   Salomon Fick, LCSW

## 2023-03-14 ENCOUNTER — Encounter: Payer: Self-pay | Admitting: Psychiatry

## 2023-03-14 ENCOUNTER — Ambulatory Visit: Payer: Medicare Other | Admitting: Psychiatry

## 2023-03-14 VITALS — BP 131/81 | HR 87 | Temp 97.3°F | Ht 69.0 in | Wt 226.8 lb

## 2023-03-14 DIAGNOSIS — F251 Schizoaffective disorder, depressive type: Secondary | ICD-10-CM | POA: Diagnosis not present

## 2023-03-14 DIAGNOSIS — F429 Obsessive-compulsive disorder, unspecified: Secondary | ICD-10-CM

## 2023-03-14 DIAGNOSIS — Z79899 Other long term (current) drug therapy: Secondary | ICD-10-CM | POA: Diagnosis not present

## 2023-03-14 DIAGNOSIS — Z9189 Other specified personal risk factors, not elsewhere classified: Secondary | ICD-10-CM | POA: Insufficient documentation

## 2023-03-14 DIAGNOSIS — F17200 Nicotine dependence, unspecified, uncomplicated: Secondary | ICD-10-CM

## 2023-03-14 MED ORDER — QUETIAPINE FUMARATE 300 MG PO TABS
600.0000 mg | ORAL_TABLET | Freq: Every day | ORAL | 1 refills | Status: DC
Start: 2023-03-14 — End: 2023-09-16

## 2023-03-14 MED ORDER — DULOXETINE HCL 30 MG PO CPEP
90.0000 mg | ORAL_CAPSULE | Freq: Every day | ORAL | 1 refills | Status: DC
Start: 2023-03-14 — End: 2023-09-16

## 2023-03-14 MED ORDER — BUPROPION HCL ER (XL) 150 MG PO TB24: 450.0000 mg | ORAL_TABLET | Freq: Every day | ORAL | 1 refills | Status: DC

## 2023-03-14 MED ORDER — ARIPIPRAZOLE 30 MG PO TABS
30.0000 mg | ORAL_TABLET | Freq: Every day | ORAL | 1 refills | Status: DC
Start: 2023-03-14 — End: 2023-09-16

## 2023-03-14 NOTE — Progress Notes (Signed)
Psychiatric Initial Adult Assessment   Patient Identification: Jeanne [name] MRN:  [id number] Date of Evaluation:  03/14/2023 Referral Source: [name] MD Chief Complaint:   Chief Complaint  Patient presents with   Establish Care   Schizoaffective disorder   Compulsive behaviors   Medication Refill   Visit Diagnosis:    ICD-10-CM   1. Schizoaffective disorder, depressive type (HCC)  F25.1 QUEtiapine (SEROQUEL) 300 MG tablet    DULoxetine (CYMBALTA) 30 MG capsule    buPROPion (WELLBUTRIN XL) 150 MG 24 hr tablet    ARIPiprazole (ABILIFY) 30 MG tablet    2. Obsessive-compulsive disorder with good or fair insight  F42.9 DULoxetine (CYMBALTA) 30 MG capsule    3. Tobacco use disorder  F17.200     4. High risk medication use  Z79.899     5. At risk for prolonged QT interval syndrome  Z91.89 EKG 12-Lead      History of Present Illness:  Jeanne [name] is a 62 year old African-American female, on disability, lives in [place], has a history of schizoaffective disorder, OCD, hypertension, hyperlipidemia, diabetes mellitus, glaucoma, history of obstructive sleep apnea, chronic pain, rheumatoid arthritis was evaluated in office today, presented to establish care.  Patient used to be under the care of Dr.Salina Michae Kava -who left the practice at [facility] outpatient service.  Patient is here to establish care.  Patient with history of schizoaffective disorder, reports a history of having auditory, visual hallucinations and delusions.  Patient calls these episodes as ' Scripts' and felt she was stuck in them when she went through it.  Patient however reports on the current medication regimen (currently on 2 antipsychotics, Seroquel as well as Abilify) her symptoms are managed well.  Her voices are very quiet and it does not bother her.  Patient reports a history of OCD, reports she struggles with counting behaviors .  Patient reports she had these anxiety related to  her ' heart stopping' if she does not count.  Patient reports she used to do it for up to 4 hours of her day when she used to struggle with these compulsive behaviors.  Currently on Cymbalta 90 mg along with other psychotropics which is managing her symptoms well.  Patient currently denies any significant anxiety, sleep problems.  Denies any history of trauma.  Patient reports her niece is currently undergoing surgery at [facility].  She hence is worried about it although she is managing it okay.  She does have good support system.  She has to go visit her niece as soon as she is done with this appointment.  Patient currently denies any suicidality, homicidality or perceptual disturbances.  Patient denies any substance abuse problems.  Patient today appeared to be alert, oriented to person place time situation.  3 word memory immediate 3 out of 3, after 5 minutes 3 out of 3.  Patient with concentration and attention good.  Patient was able to do serial sevens correctly.  Patient is currently undergoing psychotherapy, Ms. Theresia Lo -[address]  behavioral medicine at [facility].  Therapy sessions are beneficial.     Associated Signs/Symptoms: Depression Symptoms:   As noted above (Hypo) Manic Symptoms:   Denies Anxiety Symptoms:  Obsessive Compulsive Symptoms:   Counting,, Psychotic Symptoms:  Delusions, Hallucinations: Auditory, per history although currently denies Visual PTSD Symptoms: Negative  Past Psychiatric History: Patient with a previous diagnosis of schizoaffective disorder, OCD. Patient used to be under the care of Dr. [name] - visit dates- 04/10/2015 -  09/09/2022 Prior to that she was under the care of Dr.Kaur. Hospitalizations-5-6 times, last time was in 2004 in [place].  Patient also had 2-year outpatient day program in [place].  1 year outpatient at [facility] and 3 hospitalization at [facility] and [facility]. Patient denies suicide attempts.   Did have suicidal ideation several years ago. Denies any violent behaviors. Denies access to guns. Denies history of abuse.  Previous Psychotropic Medications: Yes past trials of medications-risperidone, Zyprexa, Prolixin, Prozac, Zoloft, Effexor, Lamictal  Substance Abuse History in the last 12 months:  No.  Consequences of Substance Abuse: Negative  Past Medical History: Patient denies any history of head injuries or seizures. 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   Diabetes mellitus, type II (HCC)    Difficult airway for intubation 03/13/2019   Difficult intubation 07/09/2013   Glidescope used, see Anesthesia note   DVT (deep venous thrombosis) (HCC) 2014   LEFT LEG   Gastritis    Glaucoma    Heart murmur    History of blood clots    Hyperlipidemia    Hypertension    Hypokalemia 03/05/2013   Kidney disease    stage ll   Neuromuscular disorder (HCC)    " NEUROPATHY IN  MY HANDS"   Obsessive-compulsive disorder    Osteoarthritis    Rheumatoid arthritis (HCC)    Rheumatoid arthritis (HCC) 2016   Schizo-affective psychosis (HCC)    Schizoaffective disorder (HCC)    Scleritis of both eyes    Sleep apnea    Bipap   Suppurative hidradenitis    Undifferentiated connective tissue disease (HCC)     Past Surgical History:  Procedure Laterality Date   BUNIONECTOMY     CERVICAL CONIZATION W/BX     COLONOSCOPY WITH PROPOFOL N/A 03/13/2019   Procedure: COLONOSCOPY WITH PROPOFOL;  Surgeon: Sherrilyn Rist, MD;  Location: [facility];  Service: Gastroenterology;  Laterality: N/A;   LAPAROSCOPIC ADRENALECTOMY Left 07/09/2013    Procedure: LAPAROSCOPIC LEFT  ADRENALECTOMY;  Surgeon: Velora Heckler, MD;  Location: [facility];  Service: General;  Laterality: Left;   POLYPECTOMY  03/13/2019   Procedure: POLYPECTOMY;  Surgeon: Sherrilyn Rist, MD;  Location: [facility];  Service: Gastroenterology;;   sweat gland removal  1975    Family Psychiatric History: As noted below.  Family History:  Family History  Problem Relation Age of Onset   Diabetes Mother    Hypertension Mother    Hyperlipidemia Mother    Stroke Mother    Depression Mother    Eating disorder Mother    Hypertension Father    Hypertension Sister    Hypertension Sister    Cancer Maternal Aunt        breast ca   Breast cancer Maternal Aunt    Seizures Maternal Uncle    Heart attack Maternal Grandfather 66   Heart attack Maternal Grandmother 50   Bipolar disorder Nephew    Alcohol abuse Neg Hx    Anxiety disorder Neg Hx    Drug abuse Neg Hx    Schizophrenia Neg Hx    Colon cancer Neg  Hx    Esophageal cancer Neg Hx    Stomach cancer Neg Hx    Rectal cancer Neg Hx     Social History:   Social History   Socioeconomic History   Marital status: Single    Spouse name: Not on file   Number of children: 0   Years of education: college   Highest education level: Not on file  Occupational History   Occupation: Retired  Tobacco Use   Smoking status: Every Day    Current packs/day: 0.50    Average packs/day: 0.5 packs/day for 25.0 years (12.5 ttl pk-yrs)    Types: Cigarettes   Smokeless tobacco: Never   Tobacco comments:    Slowly working thru it on her own  Vaping Use   Vaping status: Never Used  Substance and Sexual Activity   Alcohol use: Yes    Alcohol/week: 0.0 standard drinks of alcohol    Comment: " drink just on weekends " Last drink was in the spring of 2016   Drug use: No   Sexual activity: Not Currently  Other Topics Concern   Not on file  Social History Narrative   Pt is living in [place] with mother. Born and raised in [place]  by parents. Childhood was fine. Pt has 3 older sisters. Pt has a law degree. 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 Pt is currently on disability for mental health issues. Single, no kids.    Social Determinants of Health   Financial Resource Strain: Not on file  Food Insecurity: Not on file  Transportation Needs: Not on file  Physical Activity: Not on file  Stress: Not on file  Social Connections: Not on file    Additional Social History:  Patient was born and raised in [place].  Patient reports she was raised by both parents.  Patient had a good childhood.  She has 3 older sisters.  Patient has a law degree.  Patient reports she did work in the same field initially, last time she worked was in 2001.  She is currently on SSD.  She has never been married.  Denies having children.  She lives by herself in [place].  She has good support system from family including a nephew. Two of her sisters live in [place] and one in [place].  Patient denies history of trauma.  Denies legal issues.  Allergies:   Allergies  Allergen Reactions   Ace Inhibitors Anaphylaxis   Arava [Leflunomide] Other (See Comments)    Nephritis and kidney clearance issues   Travatan [Travoprost] Other (See Comments)    Red eyes, irritation    Methotrexate Other (See Comments)    Metabolic Disorder Labs: Lab Results  Component Value Date   HGBA1C 5.6 08/08/2020   No results found for: "PROLACTIN" Lab Results  Component Value Date   CHOL 177 08/08/2020   TRIG 94 08/08/2020   HDL 63 08/08/2020   CHOLHDL 2.4 01/02/2020   LDLCALC 95 08/08/2020   LDLCALC 90 01/02/2020   Lab Results  Component Value Date   TSH 1.13 08/08/2020    Therapeutic Level Labs: No results found for: "LITHIUM" No results found for: "CBMZ" No results found for: "VALPROATE"  Current Medications: Current Outpatient Medications  Medication Sig Dispense Refill   amLODipine (NORVASC) 5 MG tablet Take 5 mg by  mouth every morning.      aspirin EC 81 MG tablet Take 162 mg by mouth daily.     atorvastatin (LIPITOR) 20  MG tablet Take 1 tablet by mouth daily.     Coenzyme Q10 (COQ-10) 150 MG CAPS Take 300 mg by mouth daily.     Famotidine (PEPCID AC PO) Take 20 mg by mouth daily.     furosemide (LASIX) 40 MG tablet Take 40 mg by mouth.     glucosamine-chondroitin 500-400 MG tablet Take 2 tablets by mouth every morning.     inFLIXimab-abda (RENFLEXIS IV) Inject into the vein.     inFLIXimab-dyyb 3 mg/kg in sodium chloride 0.9 % Inject 3 mg/kg into the vein every 8 (eight) weeks.     Krill Oil 300 MG CAPS Take 300 mg by mouth daily.     levobunolol (BETAGAN) 0.5 % ophthalmic solution Place 1 drop into both eyes every morning.      Multiple Vitamin (MULTIVITAMIN WITH MINERALS) TABS tablet Take 1 tablet by mouth daily.     oxyCODONE-acetaminophen (PERCOCET/ROXICET) 5-325 MG tablet Take 1-2 tablets by mouth every 4 (four) hours as needed for severe pain.     PRESCRIPTION MEDICATION Inhale 1 application into the lungs at bedtime. BiPAP setting 8/12     tirzepatide (MOUNJARO) 10 MG/0.5ML Pen Inject 10 mg into the skin once a week. 6 mL 1   tirzepatide (MOUNJARO) 15 MG/0.5ML Pen Inject 15 mg into the skin once a week. 6 mL 1   Turmeric POWD Take 1,500 mcg by mouth daily at 12 noon.      verapamil (CALAN-SR) 180 MG CR tablet Take 1 tablet (180 mg total) by mouth daily. 30 tablet 1   ARIPiprazole (ABILIFY) 30 MG tablet Take 1 tablet (30 mg total) by mouth daily. 90 tablet 1   buPROPion (WELLBUTRIN XL) 150 MG 24 hr tablet Take 3 tablets (450 mg total) by mouth daily. 270 tablet 1   DULoxetine (CYMBALTA) 30 MG capsule Take 3 capsules (90 mg total) by mouth daily. 270 capsule 1   QUEtiapine (SEROQUEL) 300 MG tablet Take 2 tablets (600 mg total) by mouth at bedtime. 180 tablet 1   No current facility-administered medications for this visit.    Musculoskeletal: Strength & Muscle Tone: within normal limits Gait &  Station: normal Patient leans: N/A  Psychiatric Specialty Exam: Review of Systems  Psychiatric/Behavioral: Negative.      Blood pressure 131/81, pulse 87, temperature (!) 97.3 F (36.3 C), temperature source Skin, height 5\' 9"  (1.753 m), weight 226 lb 12.8 oz (102.9 kg).Body mass index is 33.49 kg/m.  General Appearance: Fairly Groomed  Eye Contact:  Fair  Speech:  Normal Rate  Volume:  Normal  Mood:  Euthymic  Affect:  Appropriate  Thought Process:  Goal Directed and Descriptions of Associations: Intact  Orientation:  Full (Time, Place, and Person)  Thought Content:  Logical  Suicidal Thoughts:  No  Homicidal Thoughts:  No  Memory:  Immediate;   Fair Recent;   Fair Remote;   Fair  Judgement:  Fair  Insight:  Fair  Psychomotor Activity:  Normal  Concentration:  Concentration: Fair and Attention Span: Fair  Recall:  [employer] of Knowledge:Fair  Language: Fair  Akathisia:  No  Handed:  Right  AIMS (if indicated):  done  Assets:  IT sales professional Social Support Talents/Skills Transportation Vocational/Educational  ADL's:  Intact  Cognition: WNL  Sleep:  Fair   Screenings: AIMS    Flowsheet Row Office Visit from 03/14/2023 in [facility]  AIMS Total Score 0      GAD-7  Flowsheet Row Office Visit from 03/14/2023 in [facility]  Total GAD-7 Score 0      PHQ2-9    Flowsheet Row Office Visit from 03/14/2023 in [facility] Video Visit from 09/09/2022 in [facility] Video Visit from 05/13/2022 in [facility] [facility] Video Visit from [facility]/04[facility]2021 [facility]  PHQ-2 Total Score 0 0 0 0 0  PHQ-9 Total Score 0 -- -- -- --       Flowsheet Row Office Visit from 03/14/2023 in [facility] Video Visit from 09/09/2022 in [facility] Video Visit from 05/13/2022 in [facility]  C-SSRS RISK CATEGORY No Risk No Risk No Risk       Assessment and Plan: Zarai [name] is a 62 year old African-American female on disability, has a history of schizoaffective disorder, OCD, diabetes mellitus, hypertension, hyperlipidemia, chronic pain, rheumatoid arthritis, multiple other medical problems currently stable on current medication regimen, presented to establish care.  Discussed plan as noted below.  The patient demonstrates the following risk factors for suicide: Chronic risk factors for suicide include: psychiatric disorder of schizoaffective disorder, OCD, medical illness diabetes mellitus, chronic pain, rheumatoid arthritis, and chronic pain. Acute risk factors for suicide include: N/A. Protective factors for this patient include: positive social support, positive therapeutic relationship, and coping skills. Considering these factors, the overall suicide risk at this point appears to be low. Patient is appropriate for outpatient follow up.  Plan Schizoaffective disorder depression type-stable Continue Seroquel 600 mg at bedtime Abilify 30 mg p.o. daily Patient provided education about risk of being on 2 antipsychotic medications long-term side effects effect on heart as well as metabolic syndrome.  However patient is currently stable on this medication combination and hence will not change the dosage at this time. Wellbutrin XL 450 mg p.o. daily.  OCD-with good insight-stable Continue Cymbalta 90 mg p.o. daily  Tobacco use disorder-unstable Patient is not ready to quit.  At risk for prolonged QT syndrome-we will repeat EKG.  Patient agrees to get it done at her primary care provider's office.  EKG order in  place.  I have reviewed EKG-most recent dated 02/26/2019-[id number]-[id number].  High risk medication use-patient will benefit from labs including TSH, lipid panel, hemoglobin A1c, prolactin level, sodium, LFT, platelet count.  Patient reports she had recent labs done at primary care provider's office-patient to sign an ROI to obtain these labs.  Once we obtain it we will consider repeating labs as needed.   I have reviewed notes in EHR  per Dr. Michae Kava from 04/10/2015 - 09/09/2022.  Patient with diagnosis of schizoaffective disorder, OCD-was continued on medications as noted above.   Collaboration of Care: Referral or follow-up with counselor/therapist AEB patient encouraged to continue CBT  Patient/Guardian was advised Release of Information must be obtained prior to any record release in order to collaborate their care with an outside provider. Patient/Guardian was advised if they have not already done so to contact the registration department to sign all necessary forms in order for [name] to release information regarding their care.   Consent: Patient/Guardian gives verbal consent for treatment and assignment of benefits for services provided during this visit. Patient/Guardian expressed understanding and agreed to proceed.  Follow-up in clinic in 5 months or sooner if needed.  This note was generated in part or whole with  voice recognition software. Voice recognition is usually quite accurate but there are transcription errors that can and very often do occur. I apologize for any typographical errors that were not detected and corrected.     Jomarie Longs, MD 11/25/202411:10 AM

## 2023-03-30 ENCOUNTER — Ambulatory Visit: Payer: Medicare Other | Admitting: Psychology

## 2023-04-19 ENCOUNTER — Other Ambulatory Visit (HOSPITAL_COMMUNITY): Payer: Self-pay

## 2023-04-19 MED ORDER — MOUNJARO 12.5 MG/0.5ML ~~LOC~~ SOAJ
12.5000 mg | SUBCUTANEOUS | 0 refills | Status: DC
  Filled 2023-04-19: qty 6, 84d supply, fill #0

## 2023-04-27 ENCOUNTER — Ambulatory Visit: Payer: Medicare Other | Admitting: Psychology

## 2023-04-27 DIAGNOSIS — F331 Major depressive disorder, recurrent, moderate: Secondary | ICD-10-CM

## 2023-04-27 NOTE — Progress Notes (Signed)
  Behavioral Health Counselor/Therapist Progress Note  Patient ID: Jeanne [name], MRN: [id number],    Date: 04/27/2023  Time Spent: 3:00pm-3:45pm   45 minutes   Treatment Type: Individual Therapy  Reported Symptoms: stress  Mental Status Exam: Appearance:  Casual     Behavior: Appropriate  Motor: Normal  Speech/Language:  Normal Rate  Affect: Appropriate  Mood: normal  Thought process: normal  Thought content:   WNL  Sensory/Perceptual disturbances:   WNL  Orientation: oriented to person, place, time/date, and situation  Attention: Good  Concentration: Good  Memory: WNL  Fund of knowledge:  Good  Insight:   Good  Judgment:  Good  Impulse Control: Good   Risk Assessment: Danger to Self:  No Self-injurious Behavior: No Danger to Others: No Duty to Warn:no Physical Aggression / Violence:No  Access to Firearms a concern: No  Gang Involvement:No   Subjective: Pt present for face-to-face individual therapy in person. Pt talked about being in pain bc she has a tooth that needs to be pulled.   Addressed dental resources.    Pt talked about her niece staying with her for 5 weeks after having a kidney transplant.   Pt states the time with her niece went well.  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  Pt talked about continuing to go to [facility] to work on weight loss.  She is swimming 3-4 times a week to improve her health and is taking Monjaro to help with weight loss.   Worked on self care strategies.   Provided supportive therapy.    Interventions: Cognitive Behavioral Therapy and Insight-Oriented  Diagnosis:  F33.1  Plan of Care: Recommend ongoing therapy.   Pt participated in setting treatment goals and agrees with treatment plan.   Plan to continue to meet monthly.    Treatment Plan  (Treatment Plan Target Date:  10/06/2023) Client Abilities/Strengths  Pt is  bright, engaging, and motivated for therapy.   Client Treatment Preferences  Individual therapy.  Client Statement of Needs  Improve coping skills.  Symptoms  Depressed or irritable mood.  Problems Addressed  Unipolar Depression Goals 1. Alleviate depressive symptoms and return to previous level of effective functioning. 2. Appropriately grieve the loss in order to normalize mood and to return to previously adaptive level of functioning. Objective Learn and implement behavioral strategies to overcome depression. Target Date: 2023-10-06 Frequency: Monthly  Progress: 55 Modality: individual  Related Interventions Engage the client in behavioral activation, increasing his/her activity level and contact with sources of reward, while identifying processes that inhibit activation.  Use behavioral techniques such as instruction, rehearsal, role-playing, role reversal, as needed, to facilitate activity in the client's daily life; reinforce success. Assist the client in developing skills that increase the likelihood of deriving pleasure from behavioral activation (e.g., assertiveness skills, developing an exercise plan, less internal/more external focus, increased social involvement); reinforce success. Objective Identify important people in life, past and present, and describe the quality, good and poor, of those relationships. Target Date: 2023-10-06 Frequency: Monthly  Progress: 55 Modality: individual  Related Interventions Conduct Interpersonal Therapy beginning with the assessment of the client's interpersonal inventory of important past and present relationships; develop a case formulation linking depression to grief, interpersonal role disputes, role transitions, and/or interpersonal deficits). Objective Learn and implement problem-solving and decision-making skills. Target Date: 2023-10-06 Frequency: Monthly  Progress: 55 Modality: individual  Related Interventions Conduct  Problem-Solving  Therapy using techniques such as psychoeducation, modeling, and role-playing to teach client problem-solving skills (i.e., defining a problem specifically, generating possible solutions, evaluating the pros and cons of each solution, selecting and implementing a plan of action, evaluating the efficacy of the plan, accepting or revising the plan); role-play application of the problem-solving skill to a real life issue. Encourage in the client the development of a positive problem orientation in which problems and solving them are viewed as a natural part of life and not something to be feared, despaired, or avoided. 3. Develop healthy interpersonal relationships that lead to the alleviation and help prevent the relapse of depression. 4. Develop healthy thinking patterns and beliefs about self, others, and the world that lead to the alleviation and help prevent the relapse of depression. 5. Recognize, accept, and cope with feelings of depression. Diagnosis F33.1  Conditions For Discharge Achievement of treatment goals and objectives   [name], LCSW

## 2023-05-17 NOTE — Progress Notes (Unsigned)
Cardiology Office Note:  .   Date:  05/18/2023 ID:  Jeanne [name], DOB 12/24/1960, MRN [id number] PCP: [name], MD [facility] Providers Cardiologist:  None   Patient Profile: .      PMH Hypertension Coronary artery calcification seen on CT Aortic atherosclerosis Hyperlipidemia Tobacco abuse Echocardiogram 03/2018 LVEF 55, LVH, mild TR Type 2 DM COPD CKD OSA on BiPAP Antiphospholipid Antibody Syndrome Rheumatoid arthrits        History of Present Illness: .   Jeanne [name] is a very pleasant 63 y.o. female  who is here today for new patient consult for coronary calcification on CT. Reports she is feeling well with the exception of chronic left hip pain. Reports her PCP recommended that she see cardiology due to history of antiphospholipid antibody syndrome.  She is planning to undergo left hip replacement soon. She is able to walk 2 miles and do pool exercises 3-4 times a week.  She does have limitations in climbing stairs or hills due to hip pain.  CT chest lung cancer screening 12/22/2022 revealed aortic atherosclerosis and coronary artery calcification and left main, left anterior descending, left circumflex, and right coronary arteries.  Her most recent lipid profile 11/15/2022 revealed total cholesterol 185, triglycerides 67, HDL 73, LDL 99.  She was on atorvastatin 20 mg at that time.  She denies chest pain, shortness of breath, palpitations, orthopnea, PND, edema, presyncope, syncope.  Family history: Her family history includes Bipolar disorder in her nephew; Breast cancer in her maternal aunt; Cancer in her maternal aunt; Depression in her mother; Diabetes in her mother; Eating disorder in her mother; Heart attack (age of onset: 12) in her maternal grandmother; Heart attack (age of onset: 75) in her maternal grandfather; Hyperlipidemia in her mother; Hypertension in her father, mother, sister, and sister; Seizures in her maternal uncle; Stroke in her mother.   Stroke in mother age 27  ASCVD Risk Score: The 10-year ASCVD risk score (Arnett DK, et al., 2019) is: 18.1%*   Values used to calculate the score:     Age: 14 years     Sex: Female     Is Non-Hispanic African American: Yes     Diabetic: Yes     Tobacco smoker: Yes     Systolic Blood Pressure: 104 mmHg     Is BP treated: Yes     HDL Cholesterol: 63 mg/dL*     Total Cholesterol: 177 mg/dL*     * - Cholesterol units were assumed for this score calculation     Activity: Pool exercise 3-4 x per week for 1 hour Can walk on flat ground  ROS: See HPI       Studies Reviewed: [name]   EKG Interpretation Date/Time:  Wednesday May 18 2023 13:27:35 EST Ventricular Rate:  70 PR Interval:  190 QRS Duration:  142 QT Interval:  412 QTC Calculation: 444 R Axis:   -31  Text Interpretation: Normal sinus rhythm Left axis deviation Right bundle branch block Minimal voltage criteria for LVH, may be normal variant ( R in aVL ) Confirmed by Eligha Bridegroom [phone]) on 05/18/2023 1:40:10 PM      Risk Assessment/Calculations:             Physical Exam:   VS: BP 104/82 (Cuff Size: Large)   Pulse 70   Ht 5\' 9"  (1.753 m)   Wt 227 lb (103 kg)   SpO2 97%   BMI 33.52 kg/m   Wt Readings from Last 3  Encounters:  05/18/23 227 lb (103 kg)  09/21/22 250 lb (113.4 kg)  07/15/21 259 lb (117.5 kg)     GEN: Well nourished, well developed in no acute distress NECK: No JVD; No carotid bruits CARDIAC:                                                                                                                                                         RRR, no murmurs, rubs, gallops RESPIRATORY:  Clear to auscultation without rales, wheezing or rhonchi  ABDOMEN: Soft, non-tender, non-distended EXTREMITIES:  No edema; No deformity     ASSESSMENT AND PLAN: .    Preoperative cardiac evaluation: She is scheduled for left total hip replacement. Despite hip pain, she continues to exercise on a  consistent basis. According to the Revised Cardiac Risk Index (RCRI), her Perioperative Risk of Major Cardiac Event is (%): 0.9. Her Functional Capacity in METs is: 7.99 according to the Duke Activity Status Index (DASI).   Coronary calcification: Coronary calcification in left main, LAD, LCx, and RCA noted on lung cancer screening CT 12/2022.  EKG today reveals normal sinus rhythm at 70 bpm, right bundle branch block.  We discussed getting CT calcium score for further risk stratification. She will consider getting this in the future. She denies chest pain, dyspnea, or other symptoms concerning for angina. No indication for further ischemic evaluation at this time. She is on aspirin for antiphospholipid antibody syndrome. Focus on secondary prevention including heart healthy mostly plant based diet avoiding saturated fat, processed foods, simple carbohydrates, and sugar along with aiming for at least 150 minutes of moderate intensity exercise each week. Continue atorvastatin.  Right bundle branch block: Seen on EKG 02/2019. She had an echocardiogram 03/2018 with normal heart function.  No concerning symptoms of ischemia.  No indication for further testing at this time.  Hyperlipidemia LDL goal < 70: Lipid panel completed 11/15/2022 revealed total cholesterol 185, HDL 73, LDL 99, and triglycerides 67.  She was on atorvastatin 20 mg at the time of this test.  We will increase atorvastatin to 40 mg and have follow-up lipid testing in 2-3 months prior to adding additional agent.  She is agreeable to consider adding ezetimibe 10 mg daily if LDL remains elevated.  Antiphospholipid antibody syndrome: Reports history of blood clot prior to diagnosis of antiphospholipid antibody syndrome.  She takes aspirin 162 mg daily as advised by hematology.  No bleeding concerns.  Tobacco abuse: Reports smoking 5 cigarettes a day which is an improvement for her.  Complete cessation advised.  Hypertension: BP is  well-controlled.  Stable renal function on labs completed 10/2022.  No medication changes today.  Management per PCP.  CV Risk Assessment: ASCVD risk score is 18.1%.  We discussed additional risk factors of tobacco use, hyperlipidemia, hypertension, and diabetes.  Plan/Goals: 1: Continue regular exercise to achieve 150 minutes moderate intensity exercise each week 2: Follow a heart healthy, mostly plant-based diet avoiding saturated fat, sugar, simple carbohydrates, and processed foods      Disposition: 1 year with Dr. Duke Salvia or APP  Signed, Eligha Bridegroom, NP-C

## 2023-05-18 ENCOUNTER — Ambulatory Visit (HOSPITAL_BASED_OUTPATIENT_CLINIC_OR_DEPARTMENT_OTHER): Payer: Medicare Other | Admitting: Nurse Practitioner

## 2023-05-18 ENCOUNTER — Encounter (HOSPITAL_BASED_OUTPATIENT_CLINIC_OR_DEPARTMENT_OTHER): Payer: Self-pay | Admitting: Nurse Practitioner

## 2023-05-18 ENCOUNTER — Other Ambulatory Visit (HOSPITAL_BASED_OUTPATIENT_CLINIC_OR_DEPARTMENT_OTHER): Payer: Self-pay | Admitting: *Deleted

## 2023-05-18 VITALS — BP 104/82 | HR 70 | Ht 69.0 in | Wt 227.0 lb

## 2023-05-18 DIAGNOSIS — D6861 Antiphospholipid syndrome: Secondary | ICD-10-CM

## 2023-05-18 DIAGNOSIS — I4510 Unspecified right bundle-branch block: Secondary | ICD-10-CM

## 2023-05-18 DIAGNOSIS — I10 Essential (primary) hypertension: Secondary | ICD-10-CM

## 2023-05-18 DIAGNOSIS — I2510 Atherosclerotic heart disease of native coronary artery without angina pectoris: Secondary | ICD-10-CM

## 2023-05-18 DIAGNOSIS — L97909 Non-pressure chronic ulcer of unspecified part of unspecified lower leg with unspecified severity: Secondary | ICD-10-CM

## 2023-05-18 DIAGNOSIS — E1169 Type 2 diabetes mellitus with other specified complication: Secondary | ICD-10-CM

## 2023-05-18 DIAGNOSIS — Z01810 Encounter for preprocedural cardiovascular examination: Secondary | ICD-10-CM

## 2023-05-18 DIAGNOSIS — Z86718 Personal history of other venous thrombosis and embolism: Secondary | ICD-10-CM

## 2023-05-18 DIAGNOSIS — E785 Hyperlipidemia, unspecified: Secondary | ICD-10-CM

## 2023-05-18 DIAGNOSIS — F17200 Nicotine dependence, unspecified, uncomplicated: Secondary | ICD-10-CM

## 2023-05-18 DIAGNOSIS — Z7189 Other specified counseling: Secondary | ICD-10-CM

## 2023-05-18 MED ORDER — ATORVASTATIN CALCIUM 40 MG PO TABS: 40.0000 mg | ORAL_TABLET | Freq: Every day | ORAL | 3 refills | Status: AC

## 2023-05-18 NOTE — Patient Instructions (Addendum)
Medication Instructions:   INCREASE Atorvastatin one (1) tablet by mouth ( 40 mg) daily. You can take two of your ( 20 mg) tablets at the same time and use them up.   *If you need a refill on your cardiac medications before your next appointment, please call your pharmacy*   Lab Work:  Your physician recommends that you return for a FASTING NMR/ALT, fasting after midnight and any of the locations listed below in 2-3  months. Paperwork given to patient today.   If you have labs (blood work) drawn today and your tests are completely normal, you will receive your results only by: MyChart Message (if you have MyChart) OR A paper copy in the mail If you have any lab test that is abnormal or we need to change your treatment, we will call you to review the results.    Testing/Procedures:  Jeanne [name] would like to you think about this test,  CT coronary calcium score.   Test locations:  [facility]  [facility]  This is $99 out of pocket.   Coronary CalciumScan A coronary calcium scan is an imaging test used to look for deposits of calcium and other fatty materials (plaques) in the inner lining of the blood vessels of the heart (coronary arteries). These deposits of calcium and plaques can partly clog and narrow the coronary arteries without producing any symptoms or warning signs. This puts a person at risk for a heart attack. This test can detect these deposits before symptoms develop. Tell a health care provider about: Any allergies you have. All medicines you are taking, including vitamins, herbs, eye drops, creams, and over-the-counter medicines. Any problems you or family members have had with anesthetic medicines. Any blood disorders you have. Any surgeries you have had. Any medical conditions you have. Whether you are pregnant or may be pregnant. What are the risks? Generally,  this is a safe procedure. However, problems may occur, including: Harm to a pregnant woman and her unborn baby. This test involves the use of radiation. Radiation exposure can be dangerous to a pregnant woman and her unborn baby. If you are pregnant, you generally should not have this procedure done. Slight increase in the risk of cancer. This is because of the radiation involved in the test. What happens before the procedure? No preparation is needed for this procedure. What happens during the procedure? You will undress and remove any jewelry around your neck or chest. You will put on a hospital gown. Sticky electrodes will be placed on your chest. The electrodes will be connected to an electrocardiogram (ECG) machine to record a tracing of the electrical activity of your heart. A CT scanner will take pictures of your heart. During this time, you will be asked to lie still and hold your breath for 2-3 seconds while a picture of your heart is being taken. The procedure may vary among health care providers and hospitals. What happens after the procedure? You can get dressed. You can return to your normal activities. It is up to you to get the results of your test. Ask your health care provider, or the department that is doing the test, when your results will be ready. Summary A coronary calcium scan is an imaging test used to look for deposits of calcium and other fatty materials (plaques) in the inner lining of the blood vessels of the heart (coronary arteries). Generally, this is a safe  procedure. Tell your health care provider if you are pregnant or may be pregnant. No preparation is needed for this procedure. A CT scanner will take pictures of your heart. You can return to your normal activities after the scan is done. This information is not intended to replace advice given to you by your health care provider. Make sure you discuss any questions you have with your health care  provider. Document Released: 10/02/2007 Document Revised: 02/23/2016 Document Reviewed: 02/23/2016 Elsevier Interactive Patient Education  2017 [employer].    Follow-Up: At [facility], you and your health needs are our priority.  As part of our continuing mission to provide you with exceptional heart care, we have created designated Provider Care Teams.  These Care Teams include your primary Cardiologist (physician) and Advanced Practice Providers (APPs -  Physician Assistants and Nurse Practitioners) who all work together to provide you with the care you need, when you need it.  We recommend signing up for the patient portal called "MyChart".  Sign up information is provided on this After Visit Summary.  MyChart is used to connect with patients for Virtual Visits (Telemedicine).  Patients are able to view lab/test results, encounter notes, upcoming appointments, etc.  Non-urgent messages can be sent to your provider as well.   To learn more about what you can do with MyChart, go to [email or web address].    Your next appointment:   1 year(s)  Provider:   Chilton Si, MD or Eligha Bridegroom, NP    Other Instructions  Your physician wants you to follow-up in: 1 year.  You will receive a reminder letter in the mail two months in advance. If you don't receive a letter, please call our office to schedule the follow-up appointment.  Adopting a Healthy Lifestyle.   Weight: Know what a healthy weight is for you (roughly BMI <25) and aim to maintain this. You can calculate your body mass index on your smart phone. Unfortunately, this is not the most accurate measure of healthy weight, but it is the simplest measurement to use. A more accurate measurement involves body scanning which measures lean muscle, fat tissue and bony density. We do not have this equipment at [facility].    Diet: Aim for 7+ servings of fruits and vegetables daily Limit animal fats in diet for cholesterol  and heart health - choose grass fed whenever available Avoid highly processed foods (fast food burgers, tacos, fried chicken, pizza, hot dogs, french fries)  Saturated fat comes in the form of butter, lard, coconut oil, margarine, partially hydrogenated oils, and fat in meat. These increase your risk of cardiovascular disease.  Use healthy plant oils, such as olive, canola, soy, corn, sunflower and peanut.  Whole foods such as fruits, vegetables and whole grains have fiber  Men need > 38 grams of fiber per day Women need > 25 grams of fiber per day  Load up on vegetables and fruits - one-half of your plate: Aim for color and variety, and remember that potatoes dont count. Go for whole grains - one-quarter of your plate: Whole wheat, barley, wheat berries, quinoa, oats, brown rice, and foods made with them. If you want pasta, go with whole wheat pasta. Protein power - one-quarter of your plate: Fish, chicken, beans, and nuts are all healthy, versatile protein sources. Limit red meat. You need carbohydrates for energy! The type of carbohydrate is more important than the amount. Choose carbohydrates such as vegetables, fruits, whole grains, beans, and nuts in the  place of white rice, white pasta, potatoes (baked or fried), macaroni and cheese, cakes, cookies, and donuts.  If youre thirsty, drink water. Coffee and tea are good in moderation, but skip sugary drinks and limit milk and dairy products to one or two daily servings. Keep sugar intake at 6 teaspoons or 24 grams or LESS       Exercise: Aim for 150 min of moderate intensity exercise weekly for heart health, and weights twice weekly for bone health Stay active - any steps are better than no steps! Aim for 7-9 hours of sleep daily

## 2023-05-25 ENCOUNTER — Ambulatory Visit: Payer: Medicare Other | Admitting: Psychology

## 2023-05-25 DIAGNOSIS — F331 Major depressive disorder, recurrent, moderate: Secondary | ICD-10-CM

## 2023-05-25 NOTE — Progress Notes (Signed)
 [facility] Counselor/Therapist Progress Note  Patient ID: Jeanne [name], MRN: [id number],    Date: 05/25/2023  Time Spent: 2:00pm-2:45pm   45 minutes   Treatment Type: Individual Therapy  Reported Symptoms: stress  Mental Status Exam: Appearance:  Casual     Behavior: Appropriate  Motor: Normal  Speech/Language:  Normal Rate  Affect: Appropriate  Mood: normal  Thought process: normal  Thought content:   WNL  Sensory/Perceptual disturbances:   WNL  Orientation: oriented to person, place, time/date, and situation  Attention: Good  Concentration: Good  Memory: WNL  Fund of knowledge:  Good  Insight:   Good  Judgment:  Good  Impulse Control: Good   Risk Assessment: Danger to Self:  No Self-injurious Behavior: No Danger to Others: No Duty to Warn:no Physical Aggression / Violence:No  Access to Firearms a concern: No  Gang Involvement:No   Subjective: Pt present for face-to-face individual therapy in person. Pt talked about planning to have a hip replacement on 3/17.   Her niece and sister will assist her in recovery.   Pt talked about her capacity for relationships.   She has relationships with her family and [place] meeting members but does not feel she has the band width for any additional relationships.  She feels badly that she has her limits bc she also does feel lonely at times and wishes to have more intimate intellectual relationship with someone.  Pt feels she knows her limitations bc increased stress would bring the darkness forward.   Helped pt process these issues and her feelings.   Worked on self care strategies.   Provided supportive therapy.    Interventions: Cognitive Behavioral Therapy and Insight-Oriented  Diagnosis:  F33.1  Plan of Care: Recommend ongoing therapy.   Pt participated in setting treatment goals and agrees with treatment plan.   Plan to continue to meet monthly.    Treatment Plan  (Treatment Plan Target Date:   10/06/2023) Client Abilities/Strengths  Pt is bright, engaging, and motivated for therapy.   Client Treatment Preferences  Individual therapy.  Client Statement of Needs  Improve coping skills.  Symptoms  Depressed or irritable mood.  Problems Addressed  Unipolar Depression Goals 1. Alleviate depressive symptoms and return to previous level of effective functioning. 2. Appropriately grieve the loss in order to normalize mood and to return to previously adaptive level of functioning. Objective Learn and implement behavioral strategies to overcome depression. Target Date: 2023-10-06 Frequency: Monthly  Progress: 55 Modality: individual  Related Interventions Engage the client in behavioral activation, increasing his/her activity level and contact with sources of reward, while identifying processes that inhibit activation.  Use behavioral techniques such as instruction, rehearsal, role-playing, role reversal, as needed, to facilitate activity in the client's daily life; reinforce success. Assist the client in developing skills that increase the likelihood of deriving pleasure from behavioral activation (e.g., assertiveness skills, developing an exercise plan, less internal/more external focus, increased social involvement); reinforce success. Objective Identify important people in life, past and present, and describe the quality, good and poor, of those relationships. Target Date: 2023-10-06 Frequency: Monthly  Progress: 55 Modality: individual  Related Interventions Conduct Interpersonal Therapy beginning with the assessment of the client's interpersonal inventory of important past and present relationships; develop a case formulation linking depression to grief, interpersonal role disputes, role transitions, and/or interpersonal deficits). Objective Learn and implement problem-solving and decision-making skills. Target Date: 2023-10-06 Frequency: Monthly  Progress: 55 Modality:  individual  Related Interventions Conduct Problem-Solving Therapy using techniques such  as psychoeducation, modeling, and role-playing to teach client problem-solving skills (i.e., defining a problem specifically, generating possible solutions, evaluating the pros and cons of each solution, selecting and implementing a plan of action, evaluating the efficacy of the plan, accepting or revising the plan); role-play application of the problem-solving skill to a real life issue. Encourage in the client the development of a positive problem orientation in which problems and solving them are viewed as a natural part of life and not something to be feared, despaired, or avoided. 3. Develop healthy interpersonal relationships that lead to the alleviation and help prevent the relapse of depression. 4. Develop healthy thinking patterns and beliefs about self, others, and the world that lead to the alleviation and help prevent the relapse of depression. 5. Recognize, accept, and cope with feelings of depression. Diagnosis F33.1  Conditions For Discharge Achievement of treatment goals and objectives   [name], LCSW

## 2023-05-31 ENCOUNTER — Other Ambulatory Visit (HOSPITAL_COMMUNITY): Payer: Self-pay

## 2023-05-31 ENCOUNTER — Other Ambulatory Visit: Payer: Self-pay

## 2023-05-31 MED ORDER — ENOXAPARIN SODIUM 150 MG/ML IJ SOSY
150.0000 mg | PREFILLED_SYRINGE | Freq: Every day | INTRAMUSCULAR | 0 refills | Status: DC
  Filled 2023-05-31: qty 5, 5d supply, fill #0
  Filled 2023-05-31: qty 9, 9d supply, fill #0

## 2023-06-09 ENCOUNTER — Telehealth: Payer: Self-pay | Admitting: *Deleted

## 2023-06-09 NOTE — Telephone Encounter (Signed)
   Patient Name: Jeanne [name]  DOB: 1960/11/26 MRN: [id number]  Primary Cardiologist:   Chart reviewed as part of pre-operative protocol coverage. Given past medical history and time since last visit, based on ACC/AHA guidelines, Jeanne [name] is at acceptable risk for the planned procedure without further cardiovascular testing.   The patient was advised that if she develops new symptoms prior to surgery to contact our office to arrange for a follow-up visit, and she verbalized understanding.  Regarding ASA therapy, we recommend continuation of ASA throughout the perioperative period.  However, if the surgeon feels that cessation of ASA is required in the perioperative period, it may be stopped 5-7 days prior to surgery with a plan to resume it as soon as felt to be feasible from a surgical standpoint in the post-operative period.   I will route this recommendation to the requesting party via Epic fax function and remove from pre-op pool.  Please call with questions.  Joni Reining, NP 06/09/2023, 2:04 PM

## 2023-06-09 NOTE — Telephone Encounter (Signed)
   Pre-operative Risk Assessment    Patient Name: Jeanne [name]  DOB: 01-Oct-1960 MRN: [id number]   Date of last office visit: 05/18/2023 Date of next office visit: None  Request for Surgical Clearance    Procedure:   Left Hip Total arthroplasty  Date of Surgery:  Clearance 07/04/23                                 Surgeon:  Dr.Frank Aluisio Surgeon's Group or Practice Name:  [facility] Phone number:  [phone] Fax number:  [phone].   Type of Clearance Requested:   - Medical  - Pharmacy:  Hold Aspirin Not Indicated   Type of Anesthesia:  Not Indicated   Additional requests/questions:    Signed, Emmit Pomfret   06/09/2023, 12:54 PM

## 2023-06-13 NOTE — H&P (Signed)
 TOTAL HIP ADMISSION H&P  Patient is admitted for left total hip arthroplasty.  Subjective:  Chief Complaint: Left hip pain  HPI: Jeanne [name], 63 y.o. female, has a history of pain and functional disability in the left hip due to arthritis and patient has failed non-surgical conservative treatments for greater than 12 weeks to include NSAID's and/or analgesics and activity modification. Onset of symptoms was gradual, starting  several  years ago with gradually worsening course since that time. The patient noted no past surgery on the left hip. Patient currently rates pain in the left hip at 8 out of 10 with activity. Patient has worsening of pain with activity and weight bearing, pain that interfers with activities of daily living, and pain with passive range of motion. Patient has evidence of subchondral cysts, periarticular osteophytes, and joint space narrowing by imaging studies. This condition presents safety issues increasing the risk of falls. There is no current active infection.  Patient Active Problem List   Diagnosis Date Noted   At risk for prolonged QT interval syndrome 03/14/2023   Tobacco use disorder 06/18/2021   History of DVT (deep vein thrombosis), 2014, left leg 04/21/2021   Rheumatoid arthritis (HCC) 04/21/2021   Scleritis with corneal involvement 04/21/2021   Collagen disease (HCC) 04/21/2021   Inflammatory polyarthropathy (HCC) 04/21/2021   High risk medication use 04/21/2021   Osteoarthritis of knee 04/21/2021   Chronic pain 04/21/2021   Anosmia 04/21/2021   Diabetic mononeuropathy associated with type 2 diabetes mellitus (HCC) 04/21/2021   Visceral obesity 04/21/2021   Gastroesophageal reflux disease without esophagitis 04/21/2021   Type 2 diabetes mellitus with stage 3a chronic kidney disease, without long-term current use of insulin (HCC) 01/29/2020   Hyperlipidemia associated with type 2 diabetes mellitus (HCC) 06/04/2019   Stage 3a chronic kidney disease  (HCC) 04/09/2019   Vitamin D deficiency 04/09/2019   Chronic constipation, with Hx of ileus 02/26/2019   Hypertension associated with diabetes (HCC) 02/26/2019   Class 3 severe obesity with serious comorbidity and body mass index (BMI) of 40.0 to 44.9 in adult (HCC) 02/26/2019   Cigarette nicotine dependence without complication 04/10/2015   OCD (obsessive compulsive disorder) 04/10/2015   Schizoaffective disorder, depressive type (HCC) 04/10/2015   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 07/10/2013 07/03/2013   Obstructive sleep apnea 03/05/2013   Glaucoma 03/05/2013   Connective tissue disorder (HCC) 03/05/2013    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   Diabetes mellitus, type II (HCC)    Difficult airway for intubation 03/13/2019   Difficult intubation 07/09/2013   Glidescope used, see Anesthesia note   DVT (deep venous thrombosis) (HCC) 2014   LEFT LEG   Gastritis    Glaucoma    Heart murmur    History of blood clots    Hyperlipidemia    Hypertension    Hypokalemia 03/05/2013   Kidney disease    stage ll   Neuromuscular disorder (HCC)    " NEUROPATHY IN  MY HANDS"   Obsessive-compulsive disorder    Osteoarthritis    Rheumatoid arthritis (HCC)  Rheumatoid arthritis (HCC) 2016   Schizo-affective psychosis (HCC)    Schizoaffective disorder (HCC)    Scleritis of both eyes    Sleep apnea    Bipap   Suppurative hidradenitis    Undifferentiated connective tissue disease (HCC)     Past Surgical History:  Procedure Laterality Date   BUNIONECTOMY     CERVICAL CONIZATION W/BX     COLONOSCOPY WITH  PROPOFOL N/A 03/13/2019   Procedure: COLONOSCOPY WITH PROPOFOL;  Surgeon: Sherrilyn Rist, MD;  Location: [facility];  Service: Gastroenterology;  Laterality: N/A;   LAPAROSCOPIC ADRENALECTOMY Left 07/09/2013   Procedure: LAPAROSCOPIC LEFT  ADRENALECTOMY;  Surgeon: Velora Heckler, MD;  Location: [facility];  Service: General;  Laterality: Left;   POLYPECTOMY  03/13/2019   Procedure: POLYPECTOMY;  Surgeon: Sherrilyn Rist, MD;  Location: [facility];  Service: Gastroenterology;;   sweat gland removal  1975    Prior to Admission medications   Medication Sig Start Date End Date Taking? Authorizing Provider  amLODipine (NORVASC) 5 MG tablet Take 5 mg by mouth every morning.     [provider]  ARIPiprazole (ABILIFY) 30 MG tablet Take 1 tablet (30 mg total) by mouth daily. 03/14/23   Jomarie Longs, MD  aspirin EC 81 MG tablet Take 162 mg by mouth daily.    [provider]  atorvastatin (LIPITOR) 40 MG tablet Take 1 tablet (40 mg total) by mouth daily. 05/18/23   Swinyer, Zachary George, NP  buPROPion (WELLBUTRIN XL) 150 MG 24 hr tablet Take 3 tablets (450 mg total) by mouth daily. 03/14/23   Jomarie Longs, MD  Coenzyme Q10 (COQ-10) 150 MG CAPS Take 300 mg by mouth daily.    [provider]  DULoxetine (CYMBALTA) 30 MG capsule Take 3 capsules (90 mg total) by mouth daily. 03/14/23   Jomarie Longs, MD  enoxaparin (LOVENOX) 150 MG/ML injection Inject 1 mL (150 mg total) into the skin daily. 05/31/23     Famotidine (PEPCID AC PO) Take 20 mg by mouth daily.    [provider]  furosemide (LASIX) 40 MG tablet Take 40 mg by mouth.    [provider]  glucosamine-chondroitin 500-400 MG tablet Take 2 tablets by mouth every morning.    [provider]  inFLIXimab-abda (RENFLEXIS IV) Inject into the vein.    [provider]  inFLIXimab-dyyb 3 mg/kg in sodium chloride 0.9 % Inject 3 mg/kg into the vein every 8 (eight) weeks.    [provider]  Boris Lown Oil 300 MG CAPS Take 300 mg by mouth daily.    [provider]  levobunolol (BETAGAN) 0.5 % ophthalmic solution Place 1 drop into both eyes every morning.     [provider]  Multiple Vitamin (MULTIVITAMIN WITH MINERALS) TABS tablet Take 1 tablet by mouth daily.    [provider]  NIFEdipine (PROCARDIA XL/NIFEDICAL XL) 60 MG 24 hr tablet Take 60 mg by mouth every morning. 03/15/23   [provider]  oxyCODONE-acetaminophen (PERCOCET/ROXICET) 5-325 MG tablet Take 1-2 tablets by mouth every 4 (four) hours as needed for severe pain.    [provider]  PRESCRIPTION MEDICATION Inhale 1 application into the lungs at bedtime. BiPAP setting 8/12    [provider]  QUEtiapine (SEROQUEL) 300 MG tablet Take 2 tablets (600 mg total) by mouth at bedtime. 03/14/23   Jomarie Longs, MD  tirzepatide [facility]) 12.5 MG/0.5ML Pen Inject 12.5 mg into the skin once a week. 04/19/23  Turmeric POWD Take 1,500 mcg by mouth daily at 12 noon.     [provider]  verapamil (CALAN-SR) 180 MG CR tablet Take 1 tablet (180 mg total) by mouth daily. 07/16/13   Darnell Level, MD    Allergies  Allergen Reactions   Ace Inhibitors Anaphylaxis   Arava [Leflunomide] Other (See Comments)    Nephritis and kidney clearance issues   Travatan [Travoprost] Other (See Comments)    Red eyes, irritation    Methotrexate Other (See Comments)    Social History   Socioeconomic History   Marital status: Single    Spouse name: Not on file   Number of children: 0   Years of education: college   Highest education level: Not on file  Occupational History   Occupation: Retired  Tobacco Use   Smoking status: Every Day    Current packs/day: 0.50    Average packs/day: 0.5 packs/day for 25.0 years (12.5 ttl pk-yrs)    Types: Cigarettes   Smokeless tobacco: Never   Tobacco comments:    Slowly working thru it on her own  Vaping Use   Vaping  status: Never Used  Substance and Sexual Activity   Alcohol use: Yes    Alcohol/week: 0.0 standard drinks of alcohol    Comment: " drink just on weekends " Last drink was in the spring of 2016   Drug use: No   Sexual activity: Not Currently  Other Topics Concern   Not on file  Social History Narrative   Pt is living in [place] with mother. Born and raised in [place] by parents. Childhood was fine. Pt has 3 older sisters. Pt has a law degree. 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 Pt is currently on disability for mental health issues. Single, no kids.    Social Drivers of Corporate investment banker Strain: Not on file  Food Insecurity: Not on file  Transportation Needs: Not on file  Physical Activity: Not on file  Stress: Not on file  Social Connections: Not on file  Intimate Partner Violence: Not on file    Tobacco Use: High Risk (05/18/2023)   Patient History    Smoking Tobacco Use: Every Day    Smokeless Tobacco Use: Never    Passive Exposure: Not on file   Social History   Substance and Sexual Activity  Alcohol Use Yes   Alcohol/week: 0.0 standard drinks of alcohol   Comment: " drink just on weekends " Last drink was in the spring of 2016    Family History  Problem Relation Age of Onset   Diabetes Mother    Hypertension Mother    Hyperlipidemia Mother    Stroke Mother    Depression Mother    Eating disorder Mother    Hypertension Father    Hypertension Sister    Hypertension Sister    Cancer Maternal Aunt        breast ca   Breast cancer Maternal Aunt    Seizures Maternal Uncle    Heart attack Maternal Grandfather 62   Heart attack Maternal Grandmother 54   Bipolar disorder Nephew    Alcohol abuse Neg Hx    Anxiety disorder Neg Hx    Drug abuse Neg Hx    Schizophrenia Neg Hx    Colon cancer Neg Hx    Esophageal cancer Neg Hx    Stomach cancer Neg Hx    Rectal cancer Neg Hx     ROS  Objective:  Physical Exam: - Well-developed female,  alert and oriented, in no apparent distress.  - Right hip has normal range of motion with no discomfort.  - Left hip can flex to 90 degrees with no internal rotation, only about 10 degrees of external rotation, and 10 degrees of abduction.  - Left knee shows no effusion. Range of motion is 0-125 degrees. She has some medial tenderness with slight lateral tenderness. There is no instability about the knee.  - Right knee shows no effusion. Range of motion is 5-110 degrees with crepitus on range of motion. She is tender medially and laterally with slight lateral tenderness but no instability.  - Gait pattern is antalgic on the left.  IMAGING:  Radiographs AP pelvis and lateral left hip demonstrate severe end-stage arthritis of the left hip with bone-on-bone throughout and essentially no joint space left. There are some marginal osteophytes. The right hip appears normal.   Assessment/Plan:  End stage arthritis, left hip  The patient history, physical examination, clinical judgement of the provider and imaging studies are consistent with end stage degenerative joint disease of the left hip and total hip arthroplasty is deemed medically necessary. The treatment options including medical management, injection therapy, arthroscopy and arthroplasty were discussed at length. The risks and benefits of total hip arthroplasty were presented and reviewed. The risks due to aseptic loosening, infection, stiffness, dislocation/subluxation, thromboembolic complications and other imponderables were discussed. The patient acknowledged the explanation, agreed to proceed with the plan and consent was signed. Patient is being admitted for inpatient treatment for surgery, pain control, PT, OT, prophylactic antibiotics, VTE prophylaxis, progressive ambulation and ADLs and discharge planning.The patient is planning to be discharged home.   Patient's anticipated LOS is less than 2 midnights, meeting these  requirements: - Younger than 18 - Lives within 1 hour of care - Has a competent adult at home to recover with post-op recover - NO history of  - Chronic pain requiring opiods  - Diabetes  - Coronary Artery Disease  - Heart failure  - Heart attack  - Stroke  - Cardiac arrhythmia  - Respiratory Failure/COPD  - Renal failure  - Anemia  - Advanced Liver disease   Therapy Plans: HEP Disposition: Home with niece Planned DVT Prophylaxis: Lovenox x 14 days, then 81mg  aspirin two tablets daily DME Needed: Dan Humphreys ordered PCP: [name], MD Cardiologist: Levi Aland, NP (clearance received) TXA: TOPICAL Allergies: ACEI (tongue and throat swelling), Arava (kidney dysfunction) Anesthesia Concerns: Difficult intubation BMI: 33.5 Last HgbA1c: 5.2% on 03/01/23 Pharmacy: [facility]  Other: -Hx anti-phospholipid syndrome and DVT. Lovenox post-op per PCP clearance -Continue ASA through perioperative period, per cardiology -Hold Mounjaro for 1 week prior to surgery -Renflexis every 8 weeks - last dose on 2/6, next dose 4/3  - Patient was instructed on what medications to stop prior to surgery. - Follow-up visit in 2 weeks with Dr. Lequita Halt - Begin physical therapy following surgery - Pre-operative lab work as pre-surgical testing - Prescriptions will be provided in hospital at time of discharge  Weston Brass, PA-C Orthopedic Surgery [facility] [place]

## 2023-06-22 ENCOUNTER — Ambulatory Visit: Payer: Medicare Other | Admitting: Psychology

## 2023-06-22 DIAGNOSIS — F331 Major depressive disorder, recurrent, moderate: Secondary | ICD-10-CM

## 2023-06-22 NOTE — Progress Notes (Signed)
 COVID Vaccine Completed:  Date of COVID positive in last 90 days:  PCP - [name], MD Cardiologist - Eligha Bridegroom, NP  Cardiac clearance in media tab 06/10/23   CT- 12/14/22 Epic Chest x-ray -  EKG - 05/18/23 Epic Stress Test -  ECHO - 03/24/18 Epic Cardiac Cath -  Pacemaker/ICD device last checked: Spinal Cord Stimulator:  Bowel Prep -   Sleep Study -  CPAP -   Fasting Blood Sugar -  Checks Blood Sugar _____ times a day  Last dose of GLP1 agonist-  Mounjaro GLP1 instructions:  Hold 7 days before surgery    Last dose of SGLT-2 inhibitors-  N/A SGLT-2 instructions:  Hold 3 days before surgery    Blood Thinner Instructions:  Last dose:   Time: Aspirin Instructions: ASA 81, hold 5-7 days Last Dose:  Activity level:  Can go up a flight of stairs and perform activities of daily living without stopping and without symptoms of chest pain or shortness of breath.  Able to exercise without symptoms  Unable to go up a flight of stairs without symptoms of     Anesthesia review: difficult intubation, HTN, DM2, OSA, CKD, heart murmur, DVT  Patient denies shortness of breath, fever, cough and chest pain at PAT appointment  Patient verbalized understanding of instructions that were given to them at the PAT appointment. Patient was also instructed that they will need to review over the PAT instructions again at home before surgery.

## 2023-06-22 NOTE — Patient Instructions (Signed)
 SURGICAL WAITING ROOM VISITATION  Patients having surgery or a procedure may have no more than 2 support people in the waiting area - these visitors may rotate.    Children under the age of 55 must have an adult with them who is not the patient.  Due to an increase in RSV and influenza rates and associated hospitalizations, children ages 35 and under may not visit patients in [facility] hospitals.  Visitors with respiratory illnesses are discouraged from visiting and should remain at home.  If the patient needs to stay at the hospital during part of their recovery, the visitor guidelines for inpatient rooms apply. Pre-op nurse will coordinate an appropriate time for 1 support person to accompany patient in pre-op.  This support person may not rotate.    Please refer to the [facility] website for the visitor guidelines for Inpatients (after your surgery is over and you are in a regular room).    Your procedure is scheduled on: 07/04/23   Report to [facility] Main Entrance    Report to admitting at 11:30 AM   Call this number if you have problems the morning of surgery [phone]   Do not eat food :After Midnight.   After Midnight you may have the following liquids until 11:00 AM DAY OF SURGERY  Water Non-Citrus Juices (without pulp, NO RED-Apple, White grape, White cranberry) Black Coffee (NO MILK/CREAM OR CREAMERS, sugar ok)  Clear Tea (NO MILK/CREAM OR CREAMERS, sugar ok) regular and decaf                             Plain Jell-O (NO RED)                                           Fruit ices (not with fruit pulp, NO RED)                                     Popsicles (NO RED)                                                               Sports drinks like Gatorade (NO RED)                 The day of surgery:  Drink ONE (1) Pre-Surgery G2 at 11:00 AM the morning of surgery. Drink in one sitting. Do not sip.  This drink was given to you during your hospital  pre-op  appointment visit. Nothing else to drink after completing the  Pre-Surgery G2.          If you have questions, please contact your surgeon's office.   FOLLOW BOWEL PREP AND ANY ADDITIONAL PRE OP INSTRUCTIONS YOU RECEIVED FROM YOUR SURGEON'S OFFICE!!!     Oral Hygiene is also important to reduce your risk of infection.                                    Remember - BRUSH YOUR  TEETH THE MORNING OF SURGERY WITH YOUR REGULAR TOOTHPASTE  DENTURES WILL BE REMOVED PRIOR TO SURGERY PLEASE DO NOT APPLY "Poly grip" OR ADHESIVES!!!   Do NOT smoke after Midnight   Stop all vitamins and herbal supplements 7 days before surgery.   Take these medicines the morning of surgery with A SIP OF WATER: Amlodipine, Abilify, Bupropion, Duloxetine, Nexium, Procardia, Percocet, Verapamil  DO NOT TAKE ANY ORAL DIABETIC MEDICATIONS DAY OF YOUR SURGERY  How to Manage Your Diabetes Before and After Surgery  Why is it important to control my blood sugar before and after surgery? Improving blood sugar levels before and after surgery helps healing and can limit problems. A way of improving blood sugar control is eating a healthy diet by:  Eating less sugar and carbohydrates  Increasing activity/exercise  Talking with your doctor about reaching your blood sugar goals High blood sugars (greater than 180 mg/dL) can raise your risk of infections and slow your recovery, so you will need to focus on controlling your diabetes during the weeks before surgery. Make sure that the doctor who takes care of your diabetes knows about your planned surgery including the date and location.  How do I manage my blood sugar before surgery? Check your blood sugar at least 4 times a day, starting 2 days before surgery, to make sure that the level is not too high or low. Check your blood sugar the morning of your surgery when you wake up and every 2 hours until you get to the Short Stay unit. If your blood sugar is less than 70 mg/dL,  you will need to treat for low blood sugar: Do not take insulin. Treat a low blood sugar (less than 70 mg/dL) with  cup of clear juice (cranberry or apple), 4 glucose tablets, OR glucose gel. Recheck blood sugar in 15 minutes after treatment (to make sure it is greater than 70 mg/dL). If your blood sugar is not greater than 70 mg/dL on recheck, call [phone] for further instructions. Report your blood sugar to the short stay nurse when you get to Short Stay.  If you are admitted to the hospital after surgery: Your blood sugar will be checked by the staff and you will probably be given insulin after surgery (instead of oral diabetes medicines) to make sure you have good blood sugar levels. The goal for blood sugar control after surgery is 80-180 mg/dL.   WHAT DO I DO ABOUT MY DIABETES MEDICATION?  Do not take oral diabetes medicines (pills) the morning of surgery.  Hold Mounjaro 7 days prior to surgery. Last dose  DO NOT TAKE THE FOLLOWING 7 DAYS PRIOR TO SURGERY: Ozempic, Wegovy, Rybelsus (Semaglutide), Byetta (exenatide), Bydureon (exenatide ER), Victoza, Saxenda (liraglutide), or Trulicity (dulaglutide) Mounjaro (Tirzepatide) Adlyxin (Lixisenatide), Polyethylene Glycol Loxenatide.  Reviewed and Endorsed by [facility] Patient Education Committee, August 2015  Bring CPAP mask and tubing day of surgery.                              You may not have any metal on your body including hair pins, jewelry, and body piercing             Do not wear make-up, lotions, powders, perfumes, or deodorant  Do not wear nail polish including gel and S&S, artificial/acrylic nails, or any other type of covering on natural nails including finger and toenails. If you have artificial nails, gel coating, etc. that needs  to be removed by a nail salon please have this removed prior to surgery or surgery may need to be canceled/ delayed if the surgeon/ anesthesia feels like they are unable to be safely  monitored.   Do not shave  48 hours prior to surgery.    Do not bring valuables to the hospital. [facility] IS NOT             RESPONSIBLE   FOR VALUABLES.   Contacts, glasses, dentures or bridgework may not be worn into surgery.   Bring small overnight bag day of surgery.   DO NOT BRING YOUR HOME MEDICATIONS TO THE HOSPITAL. PHARMACY WILL DISPENSE MEDICATIONS LISTED ON YOUR MEDICATION LIST TO YOU DURING YOUR ADMISSION IN THE HOSPITAL!    Special Instructions: Bring a copy of your healthcare power of attorney and living will documents the day of surgery if you haven't scanned them before.              Please read over the following fact sheets you were given: IF YOU HAVE QUESTIONS ABOUT YOUR PRE-OP INSTRUCTIONS PLEASE CALL [phone]    If you received a COVID test during your pre-op visit  it is requested that you wear a mask when out in public, stay away from anyone that may not be feeling well and notify your surgeon if you develop symptoms. If you test positive for Covid or have been in contact with anyone that has tested positive in the last 10 days please notify you surgeon.      Pre-operative 5 CHG Bath Instructions   You can play a key role in reducing the risk of infection after surgery. Your skin needs to be as free of germs as possible. You can reduce the number of germs on your skin by washing with CHG (chlorhexidine gluconate) soap before surgery. CHG is an antiseptic soap that kills germs and continues to kill germs even after washing.   DO NOT use if you have an allergy to chlorhexidine/CHG or antibacterial soaps. If your skin becomes reddened or irritated, stop using the CHG and notify one of our RNs at [phone].   Please shower with the CHG soap starting 4 days before surgery using the following schedule:     Please keep in mind the following:  DO NOT shave, including legs and underarms, starting the day of your first shower.   You may shave your  face at any point before/day of surgery.  Place clean sheets on your bed the day you start using CHG soap. Use a clean washcloth (not used since being washed) for each shower. DO NOT sleep with pets once you start using the CHG.   CHG Shower Instructions:  If you choose to wash your hair and private area, wash first with your normal shampoo/soap.  After you use shampoo/soap, rinse your hair and body thoroughly to remove shampoo/soap residue.  Turn the water OFF and apply about 3 tablespoons (45 ml) of CHG soap to a CLEAN washcloth.  Apply CHG soap ONLY FROM YOUR NECK DOWN TO YOUR TOES (washing for 3-5 minutes)  DO NOT use CHG soap on face, private areas, open wounds, or sores.  Pay special attention to the area where your surgery is being performed.  If you are having back surgery, having someone wash your back for you may be helpful. Wait 2 minutes after CHG soap is applied, then you may rinse off the CHG soap.  Pat dry with a clean towel  Put on clean clothes/pajamas   If you choose to wear lotion, please use ONLY the CHG-compatible lotions on the back of this paper.     Additional instructions for the day of surgery: DO NOT APPLY any lotions, deodorants, cologne, or perfumes.   Put on clean/comfortable clothes.  Brush your teeth.  Ask your nurse before applying any prescription medications to the skin.      CHG Compatible Lotions   Aveeno Moisturizing lotion  Cetaphil Moisturizing Cream  Cetaphil Moisturizing Lotion  Clairol Herbal Essence Moisturizing Lotion, Dry Skin  Clairol Herbal Essence Moisturizing Lotion, Extra Dry Skin  Clairol Herbal Essence Moisturizing Lotion, Normal Skin  Curel Age Defying Therapeutic Moisturizing Lotion with Alpha Hydroxy  Curel Extreme Care Body Lotion  Curel Soothing Hands Moisturizing Hand Lotion  Curel Therapeutic Moisturizing Cream, Fragrance-Free  Curel Therapeutic Moisturizing Lotion, Fragrance-Free  Curel Therapeutic Moisturizing  Lotion, Original Formula  Eucerin Daily Replenishing Lotion  Eucerin Dry Skin Therapy Plus Alpha Hydroxy Crme  Eucerin Dry Skin Therapy Plus Alpha Hydroxy Lotion  Eucerin Original Crme  Eucerin Original Lotion  Eucerin Plus Crme Eucerin Plus Lotion  Eucerin TriLipid Replenishing Lotion  Keri Anti-Bacterial Hand Lotion  Keri Deep Conditioning Original Lotion Dry Skin Formula Softly Scented  Keri Deep Conditioning Original Lotion, Fragrance Free Sensitive Skin Formula  Keri Lotion Fast Absorbing Fragrance Free Sensitive Skin Formula  Keri Lotion Fast Absorbing Softly Scented Dry Skin Formula  Keri Original Lotion  Keri Skin Renewal Lotion Keri Silky Smooth Lotion  Keri Silky Smooth Sensitive Skin Lotion  Nivea Body Creamy Conditioning Oil  Nivea Body Extra Enriched Lotion  Nivea Body Original Lotion  Nivea Body Sheer Moisturizing Lotion Nivea Crme  Nivea Skin Firming Lotion  NutraDerm 30 Skin Lotion  NutraDerm Skin Lotion  NutraDerm Therapeutic Skin Cream  NutraDerm Therapeutic Skin Lotion  ProShield Protective Hand Cream  Provon moisturizing lotion   Incentive Spirometer  An incentive spirometer is a tool that can help keep your lungs clear and active. This tool measures how well you are filling your lungs with each breath. Taking long deep breaths may help reverse or decrease the chance of developing breathing (pulmonary) problems (especially infection) following: A long period of time when you are unable to move or be active. BEFORE THE PROCEDURE  If the spirometer includes an indicator to show your best effort, your nurse or respiratory therapist will set it to a desired goal. If possible, sit up straight or lean slightly forward. Try not to slouch. Hold the incentive spirometer in an upright position. INSTRUCTIONS FOR USE  Sit on the edge of your bed if possible, or sit up as far as you can in bed or on a chair. Hold the incentive spirometer in an upright  position. Breathe out normally. Place the mouthpiece in your mouth and seal your lips tightly around it. Breathe in slowly and as deeply as possible, raising the piston or the ball toward the top of the column. Hold your breath for 3-5 seconds or for as long as possible. Allow the piston or ball to fall to the bottom of the column. Remove the mouthpiece from your mouth and breathe out normally. Rest for a few seconds and repeat Steps 1 through 7 at least 10 times every 1-2 hours when you are awake. Take your time and take a few normal breaths between deep breaths. The spirometer may include an indicator to show your best effort. Use the indicator as a goal to work toward during each  repetition. After each set of 10 deep breaths, practice coughing to be sure your lungs are clear. If you have an incision (the cut made at the time of surgery), support your incision when coughing by placing a pillow or rolled up towels firmly against it. Once you are able to get out of bed, walk around indoors and cough well. You may stop using the incentive spirometer when instructed by your caregiver.  RISKS AND COMPLICATIONS Take your time so you do not get dizzy or light-headed. If you are in pain, you may need to take or ask for pain medication before doing incentive spirometry. It is harder to take a deep breath if you are having pain. AFTER USE Rest and breathe slowly and easily. It can be helpful to keep track of a log of your progress. Your caregiver can provide you with a simple table to help with this. If you are using the spirometer at home, follow these instructions: SEEK MEDICAL CARE IF:  You are having difficultly using the spirometer. You have trouble using the spirometer as often as instructed. Your pain medication is not giving enough relief while using the spirometer. You develop fever of 100.5 F (38.1 C) or higher. SEEK IMMEDIATE MEDICAL CARE IF:  You cough up bloody sputum that had not been  present before. You develop fever of 102 F (38.9 C) or greater. You develop worsening pain at or near the incision site. MAKE SURE YOU:  Understand these instructions. Will watch your condition. Will get help right away if you are not doing well or get worse. Document Released: 08/16/2006 Document Revised: 06/28/2011 Document Reviewed: 10/17/2006 Exit[employer] Patient Information 2014 Exit[employer], [place].   ________________________________________________________________________ WHAT IS A BLOOD TRANSFUSION? Blood Transfusion Information  A transfusion is the replacement of blood or some of its parts. Blood is made up of multiple cells which provide different functions. Red blood cells carry oxygen and are used for blood loss replacement. White blood cells fight against infection. Platelets control bleeding. Plasma helps clot blood. Other blood products are available for specialized needs, such as hemophilia or other clotting disorders. BEFORE THE TRANSFUSION  Who gives blood for transfusions?  Healthy volunteers who are fully evaluated to make sure their blood is safe. This is blood bank blood. Transfusion therapy is the safest it has ever been in the practice of medicine. Before blood is taken from a donor, a complete history is taken to make sure that person has no history of diseases nor engages in risky social behavior (examples are intravenous drug use or sexual activity with multiple partners). The donor's travel history is screened to minimize risk of transmitting infections, such as malaria. The donated blood is tested for signs of infectious diseases, such as HIV and hepatitis. The blood is then tested to be sure it is compatible with you in order to minimize the chance of a transfusion reaction. If you or a relative donates blood, this is often done in anticipation of surgery and is not appropriate for emergency situations. It takes many days to process the donated blood. RISKS AND  COMPLICATIONS Although transfusion therapy is very safe and saves many lives, the main dangers of transfusion include:  Getting an infectious disease. Developing a transfusion reaction. This is an allergic reaction to something in the blood you were given. Every precaution is taken to prevent this. The decision to have a blood transfusion has been considered carefully by your caregiver before blood is given. Blood is not given unless the  benefits outweigh the risks. AFTER THE TRANSFUSION Right after receiving a blood transfusion, you will usually feel much better and more energetic. This is especially true if your red blood cells have gotten low (anemic). The transfusion raises the level of the red blood cells which carry oxygen, and this usually causes an energy increase. The nurse administering the transfusion will monitor you carefully for complications. HOME CARE INSTRUCTIONS  No special instructions are needed after a transfusion. You may find your energy is better. Speak with your caregiver about any limitations on activity for underlying diseases you may have. SEEK MEDICAL CARE IF:  Your condition is not improving after your transfusion. You develop redness or irritation at the intravenous (IV) site. SEEK IMMEDIATE MEDICAL CARE IF:  Any of the following symptoms occur over the next 12 hours: Shaking chills. You have a temperature by mouth above 102 F (38.9 C), not controlled by medicine. Chest, back, or muscle pain. People around you feel you are not acting correctly or are confused. Shortness of breath or difficulty breathing. Dizziness and fainting. You get a rash or develop hives. You have a decrease in urine output. Your urine turns a dark color or changes to pink, red, or brown. Any of the following symptoms occur over the next 10 days: You have a temperature by mouth above 102 F (38.9 C), not controlled by medicine. Shortness of breath. Weakness after normal activity. The  white part of the eye turns yellow (jaundice). You have a decrease in the amount of urine or are urinating less often. Your urine turns a dark color or changes to pink, red, or brown. Document Released: 04/02/2000 Document Revised: 06/28/2011 Document Reviewed: 11/20/2007 [employer] Patient Information 2014 [place], [place].  _______________________________________________________________________
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
## 2023-06-23 ENCOUNTER — Other Ambulatory Visit: Payer: Self-pay

## 2023-06-23 ENCOUNTER — Encounter (HOSPITAL_COMMUNITY): Payer: Self-pay

## 2023-06-23 ENCOUNTER — Encounter (HOSPITAL_COMMUNITY)
Admission: RE | Admit: 2023-06-23 | Discharge: 2023-06-23 | Disposition: A | Discharge status: Home / Self Care | Payer: Medicare Other | Source: Ambulatory Visit | Attending: Orthopedic Surgery | Admitting: Orthopedic Surgery

## 2023-06-23 VITALS — BP 129/75 | HR 72 | Temp 98.6°F | Resp 16 | Ht 69.0 in | Wt 225.0 lb

## 2023-06-23 DIAGNOSIS — J449 Chronic obstructive pulmonary disease, unspecified: Secondary | ICD-10-CM | POA: Diagnosis not present

## 2023-06-23 DIAGNOSIS — I129 Hypertensive chronic kidney disease with stage 1 through stage 4 chronic kidney disease, or unspecified chronic kidney disease: Secondary | ICD-10-CM | POA: Diagnosis not present

## 2023-06-23 DIAGNOSIS — E1122 Type 2 diabetes mellitus with diabetic chronic kidney disease: Secondary | ICD-10-CM | POA: Insufficient documentation

## 2023-06-23 DIAGNOSIS — Z794 Long term (current) use of insulin: Secondary | ICD-10-CM | POA: Insufficient documentation

## 2023-06-23 DIAGNOSIS — Z01818 Encounter for other preprocedural examination: Secondary | ICD-10-CM

## 2023-06-23 DIAGNOSIS — N1831 Chronic kidney disease, stage 3a: Secondary | ICD-10-CM | POA: Diagnosis not present

## 2023-06-23 DIAGNOSIS — M069 Rheumatoid arthritis, unspecified: Secondary | ICD-10-CM | POA: Diagnosis not present

## 2023-06-23 DIAGNOSIS — Z01812 Encounter for preprocedural laboratory examination: Secondary | ICD-10-CM | POA: Insufficient documentation

## 2023-06-23 DIAGNOSIS — F259 Schizoaffective disorder, unspecified: Secondary | ICD-10-CM | POA: Diagnosis not present

## 2023-06-23 DIAGNOSIS — F429 Obsessive-compulsive disorder, unspecified: Secondary | ICD-10-CM | POA: Insufficient documentation

## 2023-06-23 DIAGNOSIS — G4733 Obstructive sleep apnea (adult) (pediatric): Secondary | ICD-10-CM | POA: Diagnosis not present

## 2023-06-23 DIAGNOSIS — M1612 Unilateral primary osteoarthritis, left hip: Secondary | ICD-10-CM | POA: Insufficient documentation

## 2023-06-23 HISTORY — DX: Gastro-esophageal reflux disease without esophagitis: K21.9

## 2023-06-23 LAB — SURGICAL PCR SCREEN
MRSA, PCR: NEGATIVE
Staphylococcus aureus: NEGATIVE

## 2023-06-23 LAB — BASIC METABOLIC PANEL
Anion gap: 5 (ref 5–15)
BUN: 23 mg/dL (ref 8–23)
CO2: 28 mmol/L (ref 22–32)
Calcium: 9 mg/dL (ref 8.9–10.3)
Chloride: 104 mmol/L (ref 98–111)
Creatinine, Ser: 1.12 mg/dL — ABNORMAL HIGH (ref 0.44–1.00)
GFR, Estimated: 55 mL/min — ABNORMAL LOW (ref >60)
Glucose, Bld: 85 mg/dL (ref 70–99)
Potassium: 4.3 mmol/L (ref 3.5–5.1)
Sodium: 137 mmol/L (ref 135–145)

## 2023-06-23 LAB — GLUCOSE, CAPILLARY: Glucose-Capillary: 96 mg/dL (ref 70–99)

## 2023-06-23 LAB — HEMOGLOBIN A1C
Hgb A1c MFr Bld: 5.3 % (ref 4.8–5.6)
Mean Plasma Glucose: 105.41 mg/dL

## 2023-06-23 LAB — CBC
HCT: 40.8 % (ref 36.0–46.0)
Hemoglobin: 13.3 g/dL (ref 12.0–15.0)
MCH: 32.8 pg (ref 26.0–34.0)
MCHC: 32.6 g/dL (ref 30.0–36.0)
MCV: 100.7 fL — ABNORMAL HIGH (ref 80.0–100.0)
Platelets: 182 10*3/uL (ref 150–400)
RBC: 4.05 MIL/uL (ref 3.87–5.11)
RDW: 13 % (ref 11.5–15.5)
WBC: 3.7 10*3/uL — ABNORMAL LOW (ref 4.0–10.5)
nRBC: 0 % (ref 0.0–0.2)

## 2023-06-23 LAB — TYPE AND SCREEN
ABO/RH(D): B POS
Antibody Screen: NEGATIVE

## 2023-06-24 ENCOUNTER — Encounter (HOSPITAL_COMMUNITY): Payer: Self-pay

## 2023-06-24 NOTE — Progress Notes (Signed)
 Case: [id number] Date/Time: 07/04/23 1345   Procedure: ARTHROPLASTY, HIP, TOTAL, ANTERIOR APPROACH (Left: Hip)   Anesthesia type: Choice   Pre-op diagnosis: Left hip osteoarthritis   Location: [facility] ROOM 09 / [facility]   Surgeons: Ollen Gross, MD       DISCUSSION: Jeanne [name] is a 63 yo female who presents to PAT prior to surgery above. PMH of smoking, HTN, heart murmur, antiphospholipid syndrome, COPD, OSA (uses Bipap), GERD, DM c/b neuropathy and CKD, RA, anxiety, depression, OCD, schizoaffective d/o  Prior anesthesia complications include difficult intubation.  Patient evaluated by Cardiology for pre op clearance due to CT scan showing coronary calcifications. She is on ASA and a statin. She denied any cardiac symptoms and was cleared for surgery:  "Preoperative cardiac evaluation: She is scheduled for left total hip replacement. Despite hip pain, she continues to exercise on a consistent basis. According to the Revised Cardiac Risk Index (RCRI), her Perioperative Risk of Major Cardiac Event is (%): 0.9. Her Functional Capacity in METs is: 7.99 according to the Duke Activity Status Index (DASI). "  Follows with PCP. BP and DM controlled. Uses Bipap for OSA. Has COPD by CT scan but does not use inhalers. Follows with psych for mental health. She will use Lovenox as DVT ppx post op. Medical clearance in media tab (scanned 2/21)  LD Mounjaro: 3/6  VS: BP 129/75   Pulse 72   Temp 37 C (Oral)   Resp 16   Ht 5\' 9"  (1.753 m)   Wt 102.1 kg   SpO2 99%   BMI 33.23 kg/m   PROVIDERS: [name], MD   LABS: Labs reviewed: Acceptable for surgery. (all labs ordered are listed, but only abnormal results are displayed)  Labs Reviewed  BASIC METABOLIC PANEL - Abnormal; Notable for the following components:      Result Value   Creatinine, Ser 1.12 (*)    GFR, Estimated 55 (*)    All other components within normal limits  CBC - Abnormal; Notable for the following components:    WBC 3.7 (*)    MCV 100.7 (*)    All other components within normal limits  SURGICAL PCR SCREEN  HEMOGLOBIN A1C  GLUCOSE, CAPILLARY  TYPE AND SCREEN     IMAGES:  CT Chest 12/14/22:  IMPRESSION: 1. Lung-RADS 2S, benign appearance or behavior. Continue annual screening with low-dose chest CT without contrast in 12 months. 2. The "S" modifier above refers to potentially clinically significant non lung cancer related findings. Specifically, there is aortic atherosclerosis, in addition to left main and three-vessel coronary artery disease. Please note that although the presence of coronary artery calcium documents the presence of coronary artery disease, the severity of this disease and any potential stenosis cannot be assessed on this non-gated CT examination. Assessment for potential risk factor modification, dietary therapy or pharmacologic therapy may be warranted, if clinically indicated. 3. Mild diffuse bronchial wall thickening with mild centrilobular and paraseptal emphysema; imaging findings suggestive of underlying COPD.   Aortic Atherosclerosis (ICD10-I70.0) and Emphysema (ICD10-J43.9).  EKG 05/18/23  Normal sinus rhythm, rate 70 Left axis deviation Right bundle branch block Minimal voltage criteria for LVH, may be normal variant ( R in aVL)  CV:  Echo 03/24/2018:  ------------------------ Study Conclusions  - Left ventricle: The cavity size was normal. Wall thickness was   increased in a pattern of moderate LVH. Systolic function was   normal. The estimated ejection fraction was in the range of 55%   to 60%.  Wall motion was normal; there were no regional wall   motion abnormalities. There was no evidence of elevated   ventricular filling pressure by Doppler parameters. - Tricuspid valve: There was mild regurgitation.  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/26/2019   Difficult intubation 07/09/2013   Glidescope used, see Anesthesia note   DVT (deep venous thrombosis) (HCC) 2014   LEFT LEG   Gastritis    GERD (gastroesophageal reflux disease)    Glaucoma    Heart murmur    History of blood clots    Hyperlipidemia    Hypertension    Hypokalemia 03/05/2013   Kidney disease    stage ll   Neuromuscular disorder (HCC)    " NEUROPATHY IN  MY HANDS"   Obsessive-compulsive disorder    Osteoarthritis    Rheumatoid arthritis (HCC) 2016   Schizo-affective psychosis (HCC)    Schizoaffective disorder (HCC)    Scleritis of both eyes    Sleep apnea    Bipap   Suppurative hidradenitis    Undifferentiated connective tissue disease (HCC)     Past Surgical History:  Procedure Laterality Date   BUNIONECTOMY     CERVICAL CONIZATION W/BX     COLONOSCOPY WITH PROPOFOL N/A 03/13/2019   Procedure: COLONOSCOPY WITH PROPOFOL;  Surgeon: Sherrilyn Rist, MD;  Location: [facility];  Service: Gastroenterology;  Laterality: N/A;   LAPAROSCOPIC ADRENALECTOMY Left 07/09/2013   Procedure: LAPAROSCOPIC LEFT  ADRENALECTOMY;  Surgeon: Velora Heckler, MD;  Location: [facility];  Service: General;  Laterality: Left;   POLYPECTOMY  03/13/2019   Procedure: POLYPECTOMY;  Surgeon: Sherrilyn Rist, MD;  Location: [facility];  Service: Gastroenterology;;   sweat gland removal  1975    MEDICATIONS:  amLODipine (NORVASC) 5 MG tablet   ARIPiprazole (ABILIFY) 30 MG tablet   aspirin EC 81 MG tablet   atorvastatin (LIPITOR) 40 MG tablet   buPROPion (WELLBUTRIN XL) 150 MG 24 hr tablet   Calcium Carb-Cholecalciferol (CALCIUM + D3 PO)   Coenzyme Q10 (COQ-10 PO)   DULoxetine (CYMBALTA) 30 MG capsule   enoxaparin (LOVENOX) 150 MG/ML injection   esomeprazole (NEXIUM) 40 MG  capsule   furosemide (LASIX) 40 MG tablet   GLUCOSAMINE-CHONDROITIN PO   inFLIXimab-abda (RENFLEXIS IV)   inFLIXimab-dyyb 3 mg/kg in sodium chloride 0.9 %   Krill Oil 300 MG CAPS   levobunolol (BETAGAN) 0.5 % ophthalmic solution   Multiple Vitamin (MULTIVITAMIN WITH MINERALS) TABS tablet   NIFEdipine (PROCARDIA XL/NIFEDICAL XL) 60 MG 24 hr tablet   oxyCODONE-acetaminophen (PERCOCET/ROXICET) 5-325 MG tablet   PRESCRIPTION MEDICATION   QUEtiapine (SEROQUEL) 300 MG tablet   tirzepatide (MOUNJARO) 12.5 MG/0.5ML Pen   Turmeric POWD   verapamil (CALAN-SR) 180 MG CR tablet   No current facility-administered medications for this encounter.   Marcille Blanco [facility] Short Stay/Anesthesiology [facility] Phone [phone] 06/24/2023 1:27 PM

## 2023-06-24 NOTE — Anesthesia Preprocedure Evaluation (Addendum)
 Anesthesia Evaluation  Patient identified by MRN, date of birth, ID band Patient awake    Reviewed: Allergy & Precautions, NPO status , Patient's Chart, lab work & pertinent test results  History of Anesthesia Complications (+) DIFFICULT AIRWAY and history of anesthetic complications  Airway Mallampati: II  TM Distance: >3 FB     Dental  (+) Dental Advisory Given   Pulmonary sleep apnea , Current Smoker and Patient abstained from smoking.   breath sounds clear to auscultation       Cardiovascular hypertension, Pt. on medications + DVT   Rhythm:Regular Rate:Normal     Neuro/Psych   Anxiety Depression  Schizophrenia  negative neurological ROS     GI/Hepatic Neg liver ROS,GERD  ,,  Endo/Other  diabetes, Type 2, Insulin Dependent  Class 3 obesity  Renal/GU Renal disease     Musculoskeletal  (+) Arthritis ,    Abdominal  (+) + obese  Peds  Hematology negative hematology ROS (+)   Anesthesia Other Findings   Reproductive/Obstetrics                             Anesthesia Physical Anesthesia Plan  ASA: 3  Anesthesia Plan: Spinal   Post-op Pain Management:    Induction: Intravenous  PONV Risk Score and Plan: 1 and Propofol infusion and Treatment may vary due to age or medical condition  Airway Management Planned: Simple Face Mask  Additional Equipment:   Intra-op Plan:   Post-operative Plan:   Informed Consent: I have reviewed the patients History and Physical, chart, labs and discussed the procedure including the risks, benefits and alternatives for the proposed anesthesia with the patient or authorized representative who has indicated his/her understanding and acceptance.       Plan Discussed with: CRNA  Anesthesia Plan Comments: (See PAT note from 3/6 by Sherlie Ban PA-C )        Anesthesia Quick Evaluation

## 2023-07-04 ENCOUNTER — Observation Stay (HOSPITAL_COMMUNITY)
Admission: RE | Admit: 2023-07-04 | Discharge: 2023-07-05 | Disposition: A | Discharge status: Home / Self Care | Payer: Medicare Other | Source: Ambulatory Visit | Attending: Orthopedic Surgery | Admitting: Orthopedic Surgery

## 2023-07-04 ENCOUNTER — Encounter (HOSPITAL_COMMUNITY)
Admission: RE | Disposition: A | Discharge status: Home / Self Care | Payer: Self-pay | Source: Ambulatory Visit | Attending: Orthopedic Surgery

## 2023-07-04 ENCOUNTER — Ambulatory Visit (HOSPITAL_COMMUNITY)

## 2023-07-04 ENCOUNTER — Ambulatory Visit (HOSPITAL_BASED_OUTPATIENT_CLINIC_OR_DEPARTMENT_OTHER): Admitting: Anesthesiology

## 2023-07-04 ENCOUNTER — Encounter (HOSPITAL_COMMUNITY): Payer: Self-pay | Admitting: Orthopedic Surgery

## 2023-07-04 ENCOUNTER — Other Ambulatory Visit: Payer: Self-pay

## 2023-07-04 ENCOUNTER — Observation Stay (HOSPITAL_COMMUNITY)

## 2023-07-04 ENCOUNTER — Ambulatory Visit (HOSPITAL_COMMUNITY): Payer: Self-pay | Admitting: Medical

## 2023-07-04 DIAGNOSIS — I10 Essential (primary) hypertension: Secondary | ICD-10-CM | POA: Diagnosis not present

## 2023-07-04 DIAGNOSIS — I129 Hypertensive chronic kidney disease with stage 1 through stage 4 chronic kidney disease, or unspecified chronic kidney disease: Secondary | ICD-10-CM | POA: Diagnosis not present

## 2023-07-04 DIAGNOSIS — E1122 Type 2 diabetes mellitus with diabetic chronic kidney disease: Secondary | ICD-10-CM | POA: Insufficient documentation

## 2023-07-04 DIAGNOSIS — Z79899 Other long term (current) drug therapy: Secondary | ICD-10-CM | POA: Diagnosis not present

## 2023-07-04 DIAGNOSIS — Z7982 Long term (current) use of aspirin: Secondary | ICD-10-CM | POA: Insufficient documentation

## 2023-07-04 DIAGNOSIS — F17210 Nicotine dependence, cigarettes, uncomplicated: Secondary | ICD-10-CM | POA: Insufficient documentation

## 2023-07-04 DIAGNOSIS — M1612 Unilateral primary osteoarthritis, left hip: Secondary | ICD-10-CM | POA: Diagnosis present

## 2023-07-04 DIAGNOSIS — N1831 Chronic kidney disease, stage 3a: Secondary | ICD-10-CM | POA: Diagnosis not present

## 2023-07-04 DIAGNOSIS — Z86718 Personal history of other venous thrombosis and embolism: Secondary | ICD-10-CM | POA: Insufficient documentation

## 2023-07-04 DIAGNOSIS — M169 Osteoarthritis of hip, unspecified: Principal | ICD-10-CM | POA: Diagnosis present

## 2023-07-04 DIAGNOSIS — E119 Type 2 diabetes mellitus without complications: Secondary | ICD-10-CM | POA: Diagnosis not present

## 2023-07-04 HISTORY — PX: TOTAL HIP ARTHROPLASTY: SHX124

## 2023-07-04 LAB — CBC
HCT: 34 % — ABNORMAL LOW (ref 36.0–46.0)
Hemoglobin: 11.6 g/dL — ABNORMAL LOW (ref 12.0–15.0)
MCH: 33.6 pg (ref 26.0–34.0)
MCHC: 34.1 g/dL (ref 30.0–36.0)
MCV: 98.6 fL (ref 80.0–100.0)
Platelets: 159 10*3/uL (ref 150–400)
RBC: 3.45 MIL/uL — ABNORMAL LOW (ref 3.87–5.11)
RDW: 12.5 % (ref 11.5–15.5)
WBC: 7 10*3/uL (ref 4.0–10.5)
nRBC: 0 % (ref 0.0–0.2)

## 2023-07-04 LAB — GLUCOSE, CAPILLARY
Glucose-Capillary: 83 mg/dL (ref 70–99)
Glucose-Capillary: 61 mg/dL — ABNORMAL LOW (ref 70–99)
Glucose-Capillary: 109 mg/dL — ABNORMAL HIGH (ref 70–99)

## 2023-07-04 LAB — CREATININE, SERUM
Creatinine, Ser: 1.01 mg/dL — ABNORMAL HIGH (ref 0.44–1.00)
GFR, Estimated: >60 mL/min (ref >60)

## 2023-07-04 SURGERY — ARTHROPLASTY, HIP, TOTAL, ANTERIOR APPROACH
Anesthesia: Spinal | Site: Hip | Laterality: Left

## 2023-07-04 MED ORDER — ACETAMINOPHEN 10 MG/ML IV SOLN
1000.0000 mg | Freq: Four times a day (QID) | INTRAVENOUS | Status: DC
  Administered 2023-07-04: 1000 mg via INTRAVENOUS
  Filled 2023-07-04: qty 100

## 2023-07-04 MED ORDER — PHENYLEPHRINE HCL-NACL 20-0.9 MG/250ML-% IV SOLN
INTRAVENOUS | Status: DC | PRN
Start: 2023-07-04 — End: 2023-07-04
  Administered 2023-07-04: 20 ug/min via INTRAVENOUS

## 2023-07-04 MED ORDER — MENTHOL 3 MG MT LOZG: 1.0000 | LOZENGE | OROMUCOSAL | Status: DC | PRN

## 2023-07-04 MED ORDER — OXYCODONE HCL 5 MG PO TABS
5.0000 mg | ORAL_TABLET | Freq: Once | ORAL | Status: AC | PRN
  Administered 2023-07-04: 5 mg via ORAL

## 2023-07-04 MED ORDER — HYDROMORPHONE HCL 1 MG/ML IJ SOLN
0.2500 mg | INTRAMUSCULAR | Status: DC | PRN
  Administered 2023-07-04: 0.25 mg via INTRAVENOUS
  Administered 2023-07-04: 0.5 mg via INTRAVENOUS

## 2023-07-04 MED ORDER — OXYCODONE HCL 5 MG PO TABS
5.0000 mg | ORAL_TABLET | ORAL | Status: DC | PRN
  Filled 2023-07-04 (×4): qty 2

## 2023-07-04 MED ORDER — MORPHINE SULFATE (PF) 2 MG/ML IV SOLN
1.0000 mg | INTRAVENOUS | Status: DC | PRN
  Administered 2023-07-04: 2 mg via INTRAVENOUS
  Filled 2023-07-04: qty 1

## 2023-07-04 MED ORDER — BUPIVACAINE IN DEXTROSE 0.75-8.25 % IT SOLN
INTRATHECAL | Status: DC | PRN
  Administered 2023-07-04: 2 mL via INTRATHECAL

## 2023-07-04 MED ORDER — SODIUM CHLORIDE 0.9 % IV SOLN: INTRAVENOUS | Status: DC

## 2023-07-04 MED ORDER — MIDAZOLAM HCL 2 MG/2ML IJ SOLN
INTRAMUSCULAR | Status: AC
  Filled 2023-07-04: qty 2

## 2023-07-04 MED ORDER — HYDROMORPHONE HCL 1 MG/ML IJ SOLN
INTRAMUSCULAR | Status: AC
  Administered 2023-07-04: 0.25 mg via INTRAVENOUS
  Filled 2023-07-04: qty 1

## 2023-07-04 MED ORDER — ENOXAPARIN SODIUM 30 MG/0.3ML IJ SOSY
30.0000 mg | PREFILLED_SYRINGE | Freq: Two times a day (BID) | INTRAMUSCULAR | Status: DC
  Administered 2023-07-05: 30 mg via SUBCUTANEOUS
  Filled 2023-07-04: qty 0.3

## 2023-07-04 MED ORDER — LACTATED RINGERS IV SOLN: INTRAVENOUS | Status: DC

## 2023-07-04 MED ORDER — NIFEDIPINE ER OSMOTIC RELEASE 60 MG PO TB24
60.0000 mg | ORAL_TABLET | Freq: Every morning | ORAL | Status: DC
Start: 2023-07-05 — End: 2023-07-05
  Administered 2023-07-05: 60 mg via ORAL
  Filled 2023-07-04: qty 1

## 2023-07-04 MED ORDER — MIDAZOLAM HCL 2 MG/2ML IJ SOLN
INTRAMUSCULAR | Status: DC | PRN
  Administered 2023-07-04: 2 mg via INTRAVENOUS

## 2023-07-04 MED ORDER — CARMEX CLASSIC LIP BALM EX OINT: TOPICAL_OINTMENT | CUTANEOUS | Status: DC | PRN

## 2023-07-04 MED ORDER — BUPROPION HCL ER (XL) 300 MG PO TB24
450.0000 mg | ORAL_TABLET | Freq: Every day | ORAL | Status: DC
  Administered 2023-07-05: 450 mg via ORAL
  Filled 2023-07-04: qty 1

## 2023-07-04 MED ORDER — ONDANSETRON HCL 4 MG/2ML IJ SOLN: 4.0000 mg | Freq: Four times a day (QID) | INTRAMUSCULAR | Status: DC | PRN

## 2023-07-04 MED ORDER — CEFAZOLIN SODIUM-DEXTROSE 2-4 GM/100ML-% IV SOLN
2.0000 g | INTRAVENOUS | Status: AC
  Administered 2023-07-04: 2 g via INTRAVENOUS
  Filled 2023-07-04: qty 100

## 2023-07-04 MED ORDER — OXYCODONE HCL 5 MG PO TABS
ORAL_TABLET | ORAL | Status: AC
  Filled 2023-07-04: qty 1

## 2023-07-04 MED ORDER — DEXAMETHASONE SODIUM PHOSPHATE 10 MG/ML IJ SOLN
8.0000 mg | Freq: Once | INTRAMUSCULAR | Status: AC
  Administered 2023-07-04: 8 mg via INTRAVENOUS

## 2023-07-04 MED ORDER — PROPOFOL 1000 MG/100ML IV EMUL
INTRAVENOUS | Status: AC
  Filled 2023-07-04: qty 100

## 2023-07-04 MED ORDER — POVIDONE-IODINE 10 % EX SWAB
2.0000 | Freq: Once | CUTANEOUS | Status: DC
Start: 2023-07-04 — End: 2023-07-04

## 2023-07-04 MED ORDER — 0.9 % SODIUM CHLORIDE (POUR BTL) OPTIME
TOPICAL | Status: DC | PRN
Start: 2023-07-04 — End: 2023-07-04
  Administered 2023-07-04: 1000 mL

## 2023-07-04 MED ORDER — DEXAMETHASONE SODIUM PHOSPHATE 10 MG/ML IJ SOLN
INTRAMUSCULAR | Status: AC
  Filled 2023-07-04: qty 1

## 2023-07-04 MED ORDER — CHLORHEXIDINE GLUCONATE 0.12 % MT SOLN
15.0000 mL | Freq: Once | OROMUCOSAL | Status: AC
  Administered 2023-07-04: 15 mL via OROMUCOSAL

## 2023-07-04 MED ORDER — METOCLOPRAMIDE HCL 5 MG PO TABS: 5.0000 mg | ORAL_TABLET | Freq: Three times a day (TID) | ORAL | Status: DC | PRN

## 2023-07-04 MED ORDER — GLYCOPYRROLATE 0.2 MG/ML IJ SOLN
INTRAMUSCULAR | Status: DC | PRN
  Administered 2023-07-04 (×2): .1 mg via INTRAVENOUS

## 2023-07-04 MED ORDER — PROPOFOL 500 MG/50ML IV EMUL
INTRAVENOUS | Status: DC | PRN
  Administered 2023-07-04: 40 mg via INTRAVENOUS
  Administered 2023-07-04: 40 ug/kg/min via INTRAVENOUS

## 2023-07-04 MED ORDER — ONDANSETRON HCL 4 MG PO TABS: 4.0000 mg | ORAL_TABLET | Freq: Four times a day (QID) | ORAL | Status: DC | PRN

## 2023-07-04 MED ORDER — POLYETHYLENE GLYCOL 3350 17 G PO PACK: 17.0000 g | PACK | Freq: Every day | ORAL | Status: DC | PRN

## 2023-07-04 MED ORDER — TRANEXAMIC ACID 1000 MG/10ML IV SOLN
2000.0000 mg | Freq: Once | INTRAVENOUS | Status: DC
  Filled 2023-07-04: qty 20

## 2023-07-04 MED ORDER — DIPHENHYDRAMINE HCL 12.5 MG/5ML PO ELIX: 12.5000 mg | ORAL_SOLUTION | ORAL | Status: DC | PRN

## 2023-07-04 MED ORDER — DOCUSATE SODIUM 100 MG PO CAPS
100.0000 mg | ORAL_CAPSULE | Freq: Two times a day (BID) | ORAL | Status: DC
  Administered 2023-07-04 – 2023-07-05 (×2): 100 mg via ORAL
  Filled 2023-07-04 (×2): qty 1

## 2023-07-04 MED ORDER — ALBUMIN HUMAN 5 % IV SOLN
INTRAVENOUS | Status: DC | PRN
Start: 2023-07-04 — End: 2023-07-04

## 2023-07-04 MED ORDER — ACETAMINOPHEN 325 MG PO TABS: 325.0000 mg | ORAL_TABLET | Freq: Four times a day (QID) | ORAL | Status: DC | PRN

## 2023-07-04 MED ORDER — PHENYLEPHRINE HCL (PRESSORS) 10 MG/ML IV SOLN
INTRAVENOUS | Status: AC
  Filled 2023-07-04: qty 1

## 2023-07-04 MED ORDER — BISACODYL 10 MG RE SUPP: 10.0000 mg | Freq: Every day | RECTAL | Status: DC | PRN

## 2023-07-04 MED ORDER — METHOCARBAMOL 1000 MG/10ML IJ SOLN: 500.0000 mg | Freq: Four times a day (QID) | INTRAMUSCULAR | Status: DC | PRN

## 2023-07-04 MED ORDER — CEFAZOLIN SODIUM-DEXTROSE 2-4 GM/100ML-% IV SOLN
2.0000 g | Freq: Four times a day (QID) | INTRAVENOUS | Status: AC
  Administered 2023-07-04 – 2023-07-05 (×2): 2 g via INTRAVENOUS
  Filled 2023-07-04 (×2): qty 100

## 2023-07-04 MED ORDER — FLEET ENEMA RE ENEM: 1.0000 | ENEMA | Freq: Once | RECTAL | Status: DC | PRN

## 2023-07-04 MED ORDER — BUPIVACAINE-EPINEPHRINE (PF) 0.25% -1:200000 IJ SOLN
INTRAMUSCULAR | Status: DC | PRN
Start: 2023-07-04 — End: 2023-07-04
  Administered 2023-07-04: 30 mL

## 2023-07-04 MED ORDER — WATER FOR IRRIGATION, STERILE IR SOLN
Status: DC | PRN
  Administered 2023-07-04: 1000 mL

## 2023-07-04 MED ORDER — DEXAMETHASONE SODIUM PHOSPHATE 10 MG/ML IJ SOLN
10.0000 mg | Freq: Once | INTRAMUSCULAR | Status: AC
  Administered 2023-07-05: 10 mg via INTRAVENOUS
  Filled 2023-07-04: qty 1

## 2023-07-04 MED ORDER — ARIPIPRAZOLE 10 MG PO TABS
30.0000 mg | ORAL_TABLET | Freq: Every day | ORAL | Status: DC
  Administered 2023-07-05: 30 mg via ORAL
  Filled 2023-07-04: qty 3

## 2023-07-04 MED ORDER — FENTANYL CITRATE (PF) 100 MCG/2ML IJ SOLN
INTRAMUSCULAR | Status: AC
Start: 2023-07-04 — End: ?
  Filled 2023-07-04: qty 2

## 2023-07-04 MED ORDER — ATORVASTATIN CALCIUM 40 MG PO TABS: 40.0000 mg | ORAL_TABLET | Freq: Every day | ORAL | Status: DC

## 2023-07-04 MED ORDER — OXYCODONE HCL 5 MG PO TABS
10.0000 mg | ORAL_TABLET | ORAL | Status: DC | PRN
  Administered 2023-07-04 – 2023-07-05 (×4): 10 mg via ORAL
  Filled 2023-07-04: qty 3

## 2023-07-04 MED ORDER — TRANEXAMIC ACID 1000 MG/10ML IV SOLN
INTRAVENOUS | Status: DC | PRN
Start: 2023-07-04 — End: 2023-07-04
  Administered 2023-07-04: 2000 mg via TOPICAL

## 2023-07-04 MED ORDER — DULOXETINE HCL 60 MG PO CPEP
90.0000 mg | ORAL_CAPSULE | Freq: Every day | ORAL | Status: DC
  Administered 2023-07-05: 90 mg via ORAL
  Filled 2023-07-04: qty 1

## 2023-07-04 MED ORDER — PANTOPRAZOLE SODIUM 40 MG PO TBEC
80.0000 mg | DELAYED_RELEASE_TABLET | Freq: Every day | ORAL | Status: DC
  Administered 2023-07-05: 80 mg via ORAL
  Filled 2023-07-04: qty 2

## 2023-07-04 MED ORDER — METHOCARBAMOL 500 MG PO TABS
500.0000 mg | ORAL_TABLET | Freq: Four times a day (QID) | ORAL | Status: DC | PRN
  Administered 2023-07-04 – 2023-07-05 (×2): 500 mg via ORAL
  Filled 2023-07-04 (×2): qty 1

## 2023-07-04 MED ORDER — SODIUM CHLORIDE 0.9 % IV SOLN: 12.5000 mg | INTRAVENOUS | Status: DC | PRN

## 2023-07-04 MED ORDER — PROPOFOL 1000 MG/100ML IV EMUL
INTRAVENOUS | Status: AC
Start: 2023-07-04 — End: ?
  Filled 2023-07-04: qty 100

## 2023-07-04 MED ORDER — FENTANYL CITRATE (PF) 100 MCG/2ML IJ SOLN
INTRAMUSCULAR | Status: DC | PRN
  Administered 2023-07-04 (×4): 25 ug via INTRAVENOUS

## 2023-07-04 MED ORDER — VERAPAMIL HCL ER 180 MG PO TBCR
180.0000 mg | EXTENDED_RELEASE_TABLET | Freq: Every day | ORAL | Status: DC
  Administered 2023-07-05: 180 mg via ORAL
  Filled 2023-07-04: qty 1

## 2023-07-04 MED ORDER — ORAL CARE MOUTH RINSE: 15.0000 mL | Freq: Once | OROMUCOSAL | Status: AC

## 2023-07-04 MED ORDER — QUETIAPINE FUMARATE 300 MG PO TABS
600.0000 mg | ORAL_TABLET | Freq: Every day | ORAL | Status: DC
  Administered 2023-07-04: 600 mg via ORAL
  Filled 2023-07-04 (×2): qty 2

## 2023-07-04 MED ORDER — OXYCODONE HCL 5 MG/5ML PO SOLN: 5.0000 mg | Freq: Once | ORAL | Status: AC | PRN

## 2023-07-04 MED ORDER — INSULIN ASPART 100 UNIT/ML IJ SOLN
0.0000 [IU] | INTRAMUSCULAR | Status: DC | PRN
Start: 2023-07-04 — End: 2023-07-04

## 2023-07-04 MED ORDER — PHENOL 1.4 % MT LIQD: 1.0000 | OROMUCOSAL | Status: DC | PRN

## 2023-07-04 MED ORDER — METOCLOPRAMIDE HCL 5 MG/ML IJ SOLN: 5.0000 mg | Freq: Three times a day (TID) | INTRAMUSCULAR | Status: DC | PRN

## 2023-07-04 MED ORDER — AMLODIPINE BESYLATE 5 MG PO TABS
5.0000 mg | ORAL_TABLET | Freq: Every morning | ORAL | Status: DC
  Administered 2023-07-05: 5 mg via ORAL
  Filled 2023-07-04: qty 1

## 2023-07-04 MED ORDER — ALBUMIN HUMAN 5 % IV SOLN
INTRAVENOUS | Status: AC
  Filled 2023-07-04: qty 250

## 2023-07-04 MED ORDER — BUPIVACAINE-EPINEPHRINE (PF) 0.25% -1:200000 IJ SOLN
INTRAMUSCULAR | Status: AC
  Filled 2023-07-04: qty 30

## 2023-07-04 SURGICAL SUPPLY — 39 items
BAG COUNTER SPONGE SURGICOUNT (BAG) IMPLANT
BAG ZIPLOCK 12X15 (MISCELLANEOUS) IMPLANT
BALL HIP CERAMIC (Hips) IMPLANT
BLADE SAG 18X100X1.27 (BLADE) ×1 IMPLANT
COVER PERINEAL POST (MISCELLANEOUS) ×1 IMPLANT
COVER SURGICAL LIGHT HANDLE (MISCELLANEOUS) ×1 IMPLANT
CUP ACET PINNACLE SECTR 50MM (Hips) IMPLANT
DERMABOND ADVANCED .7 DNX12 (GAUZE/BANDAGES/DRESSINGS) ×1 IMPLANT
DRAPE FOOT SWITCH (DRAPES) ×1 IMPLANT
DRAPE STERI IOBAN 125X83 (DRAPES) ×1 IMPLANT
DRAPE U-SHAPE 47X51 STRL (DRAPES) ×2 IMPLANT
DRSG AQUACEL AG ADV 3.5X10 (GAUZE/BANDAGES/DRESSINGS) ×1 IMPLANT
DURAPREP 26ML APPLICATOR (WOUND CARE) ×1 IMPLANT
ELECT REM PT RETURN 15FT ADLT (MISCELLANEOUS) ×1 IMPLANT
GLOVE BIO SURGEON STRL SZ 6.5 (GLOVE) IMPLANT
GLOVE BIO SURGEON STRL SZ7 (GLOVE) IMPLANT
GLOVE BIO SURGEON STRL SZ8 (GLOVE) ×1 IMPLANT
GLOVE BIOGEL PI IND STRL 7.0 (GLOVE) IMPLANT
GLOVE BIOGEL PI IND STRL 8 (GLOVE) ×1 IMPLANT
GOWN STRL REUS W/ TWL LRG LVL3 (GOWN DISPOSABLE) ×2 IMPLANT
HIP BALL CERAMIC (Hips) ×1 IMPLANT
HOLDER FOLEY CATH W/STRAP (MISCELLANEOUS) ×1 IMPLANT
KIT TURNOVER KIT A (KITS) IMPLANT
LINER ACET PNNCL PLUS4 NEUTRAL (Hips) IMPLANT
MANIFOLD NEPTUNE II (INSTRUMENTS) ×1 IMPLANT
PACK ANTERIOR HIP CUSTOM (KITS) ×1 IMPLANT
PENCIL SMOKE EVACUATOR COATED (MISCELLANEOUS) ×1 IMPLANT
PINNACLE PLUS 4 NEUTRAL (Hips) ×1 IMPLANT
PINNACLE SECTOR CUP 50MM (Hips) ×1 IMPLANT
SPIKE FLUID TRANSFER (MISCELLANEOUS) ×1 IMPLANT
STEM FEMORAL SZ 6MM STD ACTIS (Stem) IMPLANT
SUT ETHIBOND NAB CT1 #1 30IN (SUTURE) ×1 IMPLANT
SUT MNCRL AB 4-0 PS2 18 (SUTURE) ×1 IMPLANT
SUT STRATAFIX 0 PDS 27 VIOLET (SUTURE) ×1 IMPLANT
SUT VIC AB 2-0 CT1 TAPERPNT 27 (SUTURE) ×2 IMPLANT
SUTURE STRATFX 0 PDS 27 VIOLET (SUTURE) ×1 IMPLANT
TOWEL GREEN STERILE FF (TOWEL DISPOSABLE) ×1 IMPLANT
TRAY FOLEY MTR SLVR 16FR STAT (SET/KITS/TRAYS/PACK) ×1 IMPLANT
TUBE SUCTION HIGH CAP CLEAR NV (SUCTIONS) ×1 IMPLANT

## 2023-07-04 NOTE — Anesthesia Procedure Notes (Signed)
 Procedure Name: MAC Date/Time: 07/04/2023 1:53 PM  Performed by: Floydene Flock, CRNAPre-anesthesia Checklist: Patient identified, Emergency Drugs available, Suction available and Patient being monitored Patient Re-evaluated:Patient Re-evaluated prior to induction Oxygen Delivery Method: Simple face mask Placement Confirmation: positive ETCO2

## 2023-07-04 NOTE — Interval H&P Note (Signed)
 History and Physical Interval Note:  07/04/2023 11:41 AM  Jeanne [name]  has presented today for surgery, with the diagnosis of Left hip osteoarthritis.  The various methods of treatment have been discussed with the patient and family. After consideration of risks, benefits and other options for treatment, the patient has consented to  Procedure(s): ARTHROPLASTY, HIP, TOTAL, ANTERIOR APPROACH (Left) as a surgical intervention.  The patient's history has been reviewed, patient examined, no change in status, stable for surgery.  I have reviewed the patient's chart and labs.  Questions were answered to the patient's satisfaction.     Homero Fellers [name]

## 2023-07-04 NOTE — Anesthesia Postprocedure Evaluation (Signed)
 Anesthesia Post Note  Patient: Jeanne [name]  Procedure(s) Performed: ARTHROPLASTY, HIP, TOTAL, ANTERIOR APPROACH (Left: Hip)     Patient location during evaluation: PACU Anesthesia Type: Spinal Level of consciousness: awake and alert Pain management: pain level controlled Vital Signs Assessment: post-procedure vital signs reviewed and stable Respiratory status: spontaneous breathing, nonlabored ventilation and respiratory function stable Cardiovascular status: blood pressure returned to baseline and stable Postop Assessment: no apparent nausea or vomiting Anesthetic complications: no   No notable events documented.  Last Vitals:  Vitals:   07/04/23 1715 07/04/23 1733  BP: 119/64 (!) 120/57  Pulse: 60 (!) 58  Resp: 11 17  Temp:  (!) 36.4 C  SpO2: 98% 100%    Last Pain:  Vitals:   07/04/23 1715  TempSrc:   PainSc: Asleep                 Lowella Curb

## 2023-07-04 NOTE — Discharge Instructions (Signed)
 Jeanne Gross, MD Total Joint Specialist [facility] [place] [address], [address] [place], [place] [address] [phone]  ANTERIOR APPROACH TOTAL HIP REPLACEMENT POSTOPERATIVE DIRECTIONS     Hip Rehabilitation, Guidelines Following Surgery  The results of a hip operation are greatly improved after range of motion and muscle strengthening exercises. Follow all safety measures which are given to protect your hip. If any of these exercises cause increased pain or swelling in your joint, decrease the amount until you are comfortable again. Then slowly increase the exercises. Call your caregiver if you have problems or questions.   HOME CARE INSTRUCTIONS  Remove items at home which could result in a fall. This includes throw rugs or furniture in walking pathways.  ICE to the affected hip as frequently as 20-30 minutes an hour and then as needed for pain and swelling. Continue to use ice on the hip for pain and swelling from surgery. You may notice swelling that will progress down to the foot and ankle. This is normal after surgery. Elevate the leg when you are not up walking on it.   Continue to use the breathing machine which will help keep your temperature down.  It is common for your temperature to cycle up and down following surgery, especially at night when you are not up moving around and exerting yourself.  The breathing machine keeps your lungs expanded and your temperature down.  DIET You may resume your previous home diet once your are discharged from the hospital.  DRESSING / WOUND CARE / SHOWERING You have an adhesive waterproof bandage over the incision. Leave this in place until your first follow-up appointment. Once you remove this you will not need to place another bandage.  You may begin showering 3 days following surgery, but do not submerge the incision under water.  ACTIVITY For the first 3-5 days, it is important to rest and keep the operative leg elevated.  You should, as a general rule, rest for 50 minutes and walk/stretch for 10 minutes per hour. After 5 days, you may slowly increase activity as tolerated.  Perform the exercises you were provided twice a day for about 15-20 minutes each session. Begin these 2 days following surgery. Walk with your walker as instructed. Use the walker until you are comfortable transitioning to a cane. Walk with the cane in the opposite hand of the operative leg. You may discontinue the cane once you are comfortable and walking steadily. Avoid periods of inactivity such as sitting longer than an hour when not asleep. This helps prevent blood clots.  Do not drive a car for 6 weeks or until released by your surgeon.  Do not drive while taking narcotics.  TED HOSE STOCKINGS Wear the elastic stockings on both legs for three weeks following surgery during the day. You may remove them at night while sleeping.  WEIGHT BEARING Weight bearing as tolerated with assist device (walker, cane, etc) as directed, use it as long as suggested by your surgeon or therapist, typically at least 4-6 weeks.  POSTOPERATIVE CONSTIPATION PROTOCOL Constipation - defined medically as fewer than three stools per week and severe constipation as less than one stool per week.  One of the most common issues patients have following surgery is constipation.  Even if you have a regular bowel pattern at home, your normal regimen is likely to be disrupted due to multiple reasons following surgery.  Combination of anesthesia, postoperative narcotics, change in appetite and fluid intake all can affect your bowels.  In order to avoid complications following surgery, here are some recommendations in order to help you during your recovery period.  Colace (docusate) - Pick up an over-the-counter form of Colace or another stool softener and take twice a day as long as you are requiring postoperative pain medications.  Take with a full glass of water daily.  If  you experience loose stools or diarrhea, hold the colace until you stool forms back up.  If your symptoms do not get better within 1 week or if they get worse, check with your doctor. Dulcolax (bisacodyl) - Pick up over-the-counter and take as directed by the product packaging as needed to assist with the movement of your bowels.  Take with a full glass of water.  Use this product as needed if not relieved by Colace only.  MiraLax (polyethylene glycol) - Pick up over-the-counter to have on hand.  MiraLax is a solution that will increase the amount of water in your bowels to assist with bowel movements.  Take as directed and can mix with a glass of water, juice, soda, coffee, or tea.  Take if you go more than two days without a movement.Do not use MiraLax more than once per day. Call your doctor if you are still constipated or irregular after using this medication for 7 days in a row.  If you continue to have problems with postoperative constipation, please contact the office for further assistance and recommendations.  If you experience "the worst abdominal pain ever" or develop nausea or vomiting, please contact the office immediatly for further recommendations for treatment.  ITCHING  If you experience itching with your medications, try taking only a single pain pill, or even half a pain pill at a time.  You can also use Benadryl over the counter for itching or also to help with sleep.   MEDICATIONS See your medication summary on the "After Visit Summary" that the nursing staff will review with you prior to discharge.  You may have some home medications which will be placed on hold until you complete the course of blood thinner medication.  It is important for you to complete the blood thinner medication as prescribed by your surgeon.  Continue your approved medications as instructed at time of discharge.  PRECAUTIONS If you experience chest pain or shortness of breath - call 911 immediately for  transfer to the hospital emergency department.  If you develop a fever greater that 101 F, purulent drainage from wound, increased redness or drainage from wound, foul odor from the wound/dressing, or calf pain - CONTACT YOUR SURGEON.                                                   FOLLOW-UP APPOINTMENTS Make sure you keep all of your appointments after your operation with your surgeon and caregivers. You should call the office at the above phone number and make an appointment for approximately two weeks after the date of your surgery or on the date instructed by your surgeon outlined in the "After Visit Summary".  RANGE OF MOTION AND STRENGTHENING EXERCISES  These exercises are designed to help you keep full movement of your hip joint. Follow your caregiver's or physical therapist's instructions. Perform all exercises about fifteen times, three times per day or as directed. Exercise both hips, even if you have had only  one joint replacement. These exercises can be done on a training (exercise) mat, on the floor, on a table or on a bed. Use whatever works the best and is most comfortable for you. Use music or television while you are exercising so that the exercises are a pleasant break in your day. This will make your life better with the exercises acting as a break in routine you can look forward to.  Lying on your back, slowly slide your foot toward your buttocks, raising your knee up off the floor. Then slowly slide your foot back down until your leg is straight again.  Lying on your back spread your legs as far apart as you can without causing discomfort.  Lying on your side, raise your upper leg and foot straight up from the floor as far as is comfortable. Slowly lower the leg and repeat.  Lying on your back, tighten up the muscle in the front of your thigh (quadriceps muscles). You can do this by keeping your leg straight and trying to raise your heel off the floor. This helps strengthen the  largest muscle supporting your knee.  Lying on your back, tighten up the muscles of your buttocks both with the legs straight and with the knee bent at a comfortable angle while keeping your heel on the floor.   POST-OPERATIVE OPIOID TAPER INSTRUCTIONS: It is important to wean off of your opioid medication as soon as possible. If you do not need pain medication after your surgery it is ok to stop day one. Opioids include: Codeine, Hydrocodone(Norco, Vicodin), Oxycodone(Percocet, oxycontin) and hydromorphone amongst others.  Long term and even short term use of opiods can cause: Increased pain response Dependence Constipation Depression Respiratory depression And more.  Withdrawal symptoms can include Flu like symptoms Nausea, vomiting And more Techniques to manage these symptoms Hydrate well Eat regular healthy meals Stay active Use relaxation techniques(deep breathing, meditating, yoga) Do Not substitute Alcohol to help with tapering If you have been on opioids for less than two weeks and do not have pain than it is ok to stop all together.  Plan to wean off of opioids This plan should start within one week post op of your joint replacement. Maintain the same interval or time between taking each dose and first decrease the dose.  Cut the total daily intake of opioids by one tablet each day Next start to increase the time between doses. The last dose that should be eliminated is the evening dose.   IF YOU ARE TRANSFERRED TO A SKILLED REHAB FACILITY If the patient is transferred to a skilled rehab facility following release from the hospital, a list of the current medications will be sent to the facility for the patient to continue.  When discharged from the skilled rehab facility, please have the facility set up the patient's Home Health Physical Therapy prior to being released. Also, the skilled facility will be responsible for providing the patient with their medications at time of  release from the facility to include their pain medication, the muscle relaxants, and their blood thinner medication. If the patient is still at the rehab facility at time of the two week follow up appointment, the skilled rehab facility will also need to assist the patient in arranging follow up appointment in our office and any transportation needs.  MAKE SURE YOU:  Understand these instructions.  Get help right away if you are not doing well or get worse.    DENTAL ANTIBIOTICS:  In most  cases prophylactic antibiotics for Dental procdeures after total joint surgery are not necessary.  Exceptions are as follows:  1. History of prior total joint infection  2. Severely immunocompromised (Organ Transplant, cancer chemotherapy, Rheumatoid biologic meds such as Humera)  3. Poorly controlled diabetes (A1C &gt; 8.0, blood glucose over 200)  If you have one of these conditions, contact your surgeon for an antibiotic prescription, prior to your dental procedure.    Pick up stool softner and laxative for home use following surgery while on pain medications. Do not submerge incision under water. Please use good hand washing techniques while changing dressing each day. May shower starting three days after surgery. Please use a clean towel to pat the incision dry following showers. Continue to use ice for pain and swelling after surgery. Do not use any lotions or creams on the incision until instructed by your surgeon.

## 2023-07-04 NOTE — Op Note (Signed)
 OPERATIVE REPORT- TOTAL HIP ARTHROPLASTY   PREOPERATIVE DIAGNOSIS: Osteoarthritis of the Left hip.   POSTOPERATIVE DIAGNOSIS: Osteoarthritis of the Left  hip.   PROCEDURE: Left total hip arthroplasty, anterior approach.   SURGEON: Ollen Gross, MD   ASSISTANT: Weston Brass, PA-C  ANESTHESIA:  Spinal  ESTIMATED BLOOD LOSS:-750 mL    DRAINS: None  COMPLICATIONS: None   CONDITION: PACU - hemodynamically stable.   BRIEF CLINICAL NOTE: Jeanne [name] is a 63 y.o. female who has advanced end-  stage arthritis of their Left  hip with progressively worsening pain and  dysfunction.The patient has failed nonoperative management and presents for  total hip arthroplasty.   PROCEDURE IN DETAIL: After successful administration of spinal  anesthetic, the traction boots for the [facility] bed were placed on both  feet and the patient was placed onto the [facility] bed, boots placed into the leg  holders. The Left hip was then isolated from the perineum with plastic  drapes and prepped and draped in the usual sterile fashion. ASIS and  greater trochanter were marked and a oblique incision was made, starting  at about 1 cm lateral and 2 cm distal to the ASIS and coursing towards  the anterior cortex of the femur. The skin was cut with a 10 blade  through subcutaneous tissue to the level of the fascia overlying the  tensor fascia lata muscle. The fascia was then incised in line with the  incision at the junction of the anterior third and posterior 2/3rd. The  muscle was teased off the fascia and then the interval between the TFL  and the rectus was developed. The Hohmann retractor was then placed at  the top of the femoral neck over the capsule. The vessels overlying the  capsule were cauterized and the fat on top of the capsule was removed.  A Hohmann retractor was then placed anterior underneath the rectus  femoris to give exposure to the entire anterior capsule. A T-shaped   capsulotomy was performed. The edges were tagged and the femoral head  was identified.       Osteophytes are removed off the superior acetabulum.  The femoral neck was then cut in situ with an oscillating saw. Traction  was then applied to the left lower extremity utilizing the [facility]  traction. The femoral head was then removed. Retractors were placed  around the acetabulum and then circumferential removal of the labrum was  performed. Osteophytes were also removed. Reaming starts at 47 mm to  medialize and  Increased in 2 mm increments to 49 mm. We reamed in  approximately 40 degrees of abduction, 20 degrees anteversion. A 50 mm  pinnacle acetabular shell was then impacted in anatomic position under  fluoroscopic guidance with excellent purchase. We did not need to place  any additional dome screws. A 32 mm neutral + 4 marathon liner was then  placed into the acetabular shell.       The femoral lift was then placed along the lateral aspect of the femur  just distal to the vastus ridge. The leg was  externally rotated and capsule  was stripped off the inferior aspect of the femoral neck down to the  level of the lesser trochanter, this was done with electrocautery. The femur was lifted after this was performed. The  leg was then placed in an extended and adducted position essentially delivering the femur. We also removed the capsule superiorly and the piriformis from the piriformis fossa to gain  excellent exposure of the  proximal femur. Rongeur was used to remove some cancellous bone to get  into the lateral portion of the proximal femur for placement of the  initial starter reamer. The starter broaches was placed  the starter broach  and was shown to go down the center of the canal. Broaching  with the Actis system was then performed starting at size 0  coursing  Up to size 6. A size 6 had excellent torsional and rotational  and axial stability. The trial standard offset neck was then  placed  with a 32 + 5 trial head. The hip was then reduced. We confirmed that  the stem was in the canal both on AP and lateral x-rays. It also has excellent sizing. The hip was reduced with outstanding stability through full extension and full external rotation.. AP pelvis was taken and the leg lengths were measured and found to be equal. Hip was then dislocated again and the femoral head and neck removed. The  femoral broach was removed. Size 6 Actis stem with a standard offset  neck was then impacted into the femur following native anteversion. Has  excellent purchase in the canal. Excellent torsional and rotational and  axial stability. It is confirmed to be in the canal on AP and lateral  fluoroscopic views. The 32 + 5 ceramic head was placed and the hip  reduced with outstanding stability. Again AP pelvis was taken and it  confirmed that the leg lengths were equal. The wound was then copiously  irrigated with saline solution and the capsule reattached and repaired  with Ethibond suture. 30 ml of .25% Bupivicaine was  injected into the capsule and into the edge of the tensor fascia lata as well as subcutaneous tissue. The fascia overlying the tensor fascia lata was then closed with a running #1 V-Loc. Subcu was closed with interrupted 2-0 Vicryl and subcuticular running 4-0 Monocryl. Incision was cleaned  and dried. Steri-Strips and a bulky sterile dressing applied. The patient was awakened and transported to  recovery in stable condition.        Please note that a surgical assistant was a medical necessity for this procedure to perform it in a safe and expeditious manner. Assistant was necessary to provide appropriate retraction of vital neurovascular structures and to prevent femoral fracture and allow for anatomic placement of the prosthesis.  Ollen Gross, M.D.

## 2023-07-04 NOTE — Transfer of Care (Signed)
 Immediate Anesthesia Transfer of Care Note  Patient: Jeanne [name]  Procedure(s) Performed: ARTHROPLASTY, HIP, TOTAL, ANTERIOR APPROACH (Left: Hip)  Patient Location: PACU  Anesthesia Type:MAC and Spinal  Level of Consciousness: drowsy  Airway & Oxygen Therapy: Patient Spontanous Breathing and Patient connected to face mask oxygen  Post-op Assessment: Report given to RN and Post -op Vital signs reviewed and stable  Post vital signs: Reviewed and stable  Last Vitals:  Vitals Value Taken Time  BP 127/73 07/04/23 1630  Temp 36.6 C 07/04/23 1600  Pulse 57 07/04/23 1644  Resp 12 07/04/23 1644  SpO2 100 % 07/04/23 1644  Vitals shown include unfiled device data.  Last Pain:  Vitals:   07/04/23 1639  TempSrc:   PainSc: 7          Complications: No notable events documented.

## 2023-07-04 NOTE — Anesthesia Procedure Notes (Signed)
 Spinal  Patient location during procedure: OR Start time: 07/04/2023 1:56 PM End time: 07/04/2023 2:01 PM Reason for block: surgical anesthesia Staffing Performed: anesthesiologist  Anesthesiologist: Lowella Curb, MD Performed by: Lowella Curb, MD Authorized by: Lowella Curb, MD   Preanesthetic Checklist Completed: patient identified, IV checked, site marked, risks and benefits discussed, surgical consent, monitors and equipment checked, pre-op evaluation and timeout performed Spinal Block Patient position: sitting Prep: DuraPrep Patient monitoring: heart rate, cardiac monitor, continuous pulse ox and blood pressure Approach: midline Location: L3-4 Injection technique: single-shot Needle Needle type: Quincke  Needle gauge: 22 G Needle length: 9 cm Assessment Sensory level: T4 Events: CSF return

## 2023-07-04 NOTE — Plan of Care (Signed)
  Problem: Activity: Goal: Risk for activity intolerance will decrease Outcome: Progressing   Problem: Safety: Goal: Ability to remain free from injury will improve Outcome: Progressing   Problem: Pain Managment: Goal: General experience of comfort will improve and/or be controlled Outcome: Progressing

## 2023-07-05 ENCOUNTER — Other Ambulatory Visit (HOSPITAL_COMMUNITY): Payer: Self-pay

## 2023-07-05 ENCOUNTER — Encounter (HOSPITAL_COMMUNITY): Payer: Self-pay | Admitting: Orthopedic Surgery

## 2023-07-05 DIAGNOSIS — M1612 Unilateral primary osteoarthritis, left hip: Secondary | ICD-10-CM | POA: Diagnosis not present

## 2023-07-05 LAB — CBC
HCT: 32.4 % — ABNORMAL LOW (ref 36.0–46.0)
Hemoglobin: 10.8 g/dL — ABNORMAL LOW (ref 12.0–15.0)
MCH: 33.2 pg (ref 26.0–34.0)
MCHC: 33.3 g/dL (ref 30.0–36.0)
MCV: 99.7 fL (ref 80.0–100.0)
Platelets: 151 10*3/uL (ref 150–400)
RBC: 3.25 MIL/uL — ABNORMAL LOW (ref 3.87–5.11)
RDW: 12.4 % (ref 11.5–15.5)
WBC: 8.2 10*3/uL (ref 4.0–10.5)
nRBC: 0 % (ref 0.0–0.2)

## 2023-07-05 LAB — BASIC METABOLIC PANEL
Anion gap: 9 (ref 5–15)
BUN: 16 mg/dL (ref 8–23)
CO2: 23 mmol/L (ref 22–32)
Calcium: 8.8 mg/dL — ABNORMAL LOW (ref 8.9–10.3)
Chloride: 103 mmol/L (ref 98–111)
Creatinine, Ser: 0.9 mg/dL (ref 0.44–1.00)
GFR, Estimated: >60 mL/min (ref >60)
Glucose, Bld: 129 mg/dL — ABNORMAL HIGH (ref 70–99)
Potassium: 4.4 mmol/L (ref 3.5–5.1)
Sodium: 135 mmol/L (ref 135–145)

## 2023-07-05 MED ORDER — OXYCODONE HCL 5 MG PO TABS
5.0000 mg | ORAL_TABLET | Freq: Four times a day (QID) | ORAL | 0 refills | Status: DC | PRN
  Filled 2023-07-05: qty 42, 6d supply, fill #0

## 2023-07-05 MED ORDER — ONDANSETRON HCL 4 MG PO TABS
4.0000 mg | ORAL_TABLET | Freq: Four times a day (QID) | ORAL | 0 refills | Status: DC | PRN
  Filled 2023-07-05: qty 20, 5d supply, fill #0

## 2023-07-05 MED ORDER — METHOCARBAMOL 500 MG PO TABS
500.0000 mg | ORAL_TABLET | Freq: Four times a day (QID) | ORAL | 0 refills | Status: DC | PRN
  Filled 2023-07-05: qty 40, 10d supply, fill #0

## 2023-07-05 NOTE — TOC Transition Note (Signed)
 Transition of Care [facility]) - Discharge Note   Patient Details  Name: Jeanne [name] MRN: [id number] Date of Birth: 04-09-1961  Transition of Care (TOC) CM/SW Contact:  Amada Jupiter, LCSW Phone Number: 07/05/2023, 10:04 AM   Clinical Narrative:     Met with pt and confirming she has received RW to room via [employer].  Plan for HEP.  No further TOC needs.  Final next level of care: Home/Self Care Barriers to Discharge: No Barriers Identified   Patient Goals and CMS Choice Patient states their goals for this hospitalization and ongoing recovery are:: return home          Discharge Placement                       Discharge Plan and Services Additional resources added to the After Visit Summary for                  DME Arranged: Walker rolling DME Agency: [employer]                  Social Drivers of Health (SDOH) Interventions SDOH Screenings   Food Insecurity: No Food Insecurity (07/04/2023)  Housing: Low Risk  (07/04/2023)  Transportation Needs: No Transportation Needs (07/04/2023)  Utilities: Not At Risk (07/04/2023)  Depression (PHQ2-9): Low Risk  (03/14/2023)  Tobacco Use: High Risk (07/04/2023)     Readmission Risk Interventions     No data to display

## 2023-07-05 NOTE — Evaluation (Signed)
 Physical Therapy Evaluation Patient Details Name: Jeanne [name] MRN: [id number] DOB: 06-06-1960 Today's Date: 07/05/2023  History of Present Illness  63 yo female s/p L DA THA on 07/04/23. PMH: OCD, DM, HTN, RA, CKD  Clinical Impression  Pt is s/p THA resulting in the deficits listed below (see PT Problem List).  Pt doing very well, amb, reviewed HEP.  Will see again in pm and pt should be ready to d/c later today from PT standpoint  Pt will benefit from acute skilled PT to increase their independence and safety with mobility to facilitate discharge.          If plan is discharge home, recommend the following: A little help with walking and/or transfers;A little help with bathing/dressing/bathroom;Help with stairs or ramp for entrance   Can travel by private vehicle        Equipment Recommendations None recommended by PT  Recommendations for Other Services       Functional Status Assessment Patient has had a recent decline in their functional status and demonstrates the ability to make significant improvements in function in a reasonable and predictable amount of time.     Precautions / Restrictions Precautions Precautions: Fall      Mobility  Bed Mobility               General bed mobility comments: in recliner    Transfers Overall transfer level: Needs assistance Equipment used: Rolling walker (2 wheels) Transfers: Sit to/from Stand Sit to Stand: Contact guard assist           General transfer comment: cues for hand placement and LLE    Ambulation/Gait Ambulation/Gait assistance: Contact guard assist Gait Distance (Feet): 60 Feet Assistive device: Rolling walker (2 wheels)         General Gait Details: cues  for  seqeunce and RW position  Stairs            Wheelchair Mobility     Tilt Bed    Modified Rankin (Stroke Patients Only)       Balance                                             Pertinent  Vitals/Pain Pain Assessment Pain Assessment: 0-10 Pain Score: 5  Pain Location: L hip Pain Descriptors / Indicators: Aching, Sore Pain Intervention(s): Limited activity within patient's tolerance, Monitored during session, Premedicated before session    Home Living Family/patient expects to be discharged to:: Private residence Living Arrangements: Alone Available Help at Discharge: Family Type of Home: House Home Access: Stairs to enter Entrance Stairs-Rails: Right;Left;Can reach both Secretary/administrator of Steps: 4   Home Layout: One level Home Equipment: Agricultural consultant (2 wheels);BSC/3in1      Prior Function Prior Level of Function : Independent/Modified Independent                     Extremity/Trunk Assessment   Upper Extremity Assessment Upper Extremity Assessment: Overall WFL for tasks assessed    Lower Extremity Assessment Lower Extremity Assessment: LLE deficits/detail LLE Deficits / Details: ankle WFL,  knee and hip grossly 3/5       Communication   Communication Communication: No apparent difficulties    Cognition Arousal: Alert Behavior During Therapy: WFL for tasks assessed/performed   PT - Cognitive impairments: No apparent impairments  Following commands: Intact       Cueing       General Comments      Exercises Total Joint Exercises Ankle Circles/Pumps: AROM, 10 reps, Both Quad Sets: AROM, Both, 10 reps Heel Slides: AAROM, Left, 10 reps Hip ABduction/ADduction: AROM, AAROM, Left, 10 reps   Assessment/Plan    PT Assessment Patient needs continued PT services  PT Problem List Decreased strength;Decreased range of motion;Decreased activity tolerance;Decreased balance;Decreased knowledge of use of DME;Decreased mobility;Pain       PT Treatment Interventions DME instruction;Gait training;Functional mobility training;Therapeutic activities;Patient/family education;Stair training    PT Goals  (Current goals can be found in the Care Plan section)  Acute Rehab PT Goals PT Goal Formulation: With patient Time For Goal Achievement: 07/12/23 Potential to Achieve Goals: Good    Frequency 7X/week     Co-evaluation               AM-PAC PT "6 Clicks" Mobility  Outcome Measure Help needed turning from your back to your side while in a flat bed without using bedrails?: A Little Help needed moving from lying on your back to sitting on the side of a flat bed without using bedrails?: A Little Help needed moving to and from a bed to a chair (including a wheelchair)?: A Little Help needed standing up from a chair using your arms (e.g., wheelchair or bedside chair)?: A Little Help needed to walk in hospital room?: A Little Help needed climbing 3-5 steps with a railing? : A Little 6 Click Score: 18    End of Session Equipment Utilized During Treatment: Gait belt Activity Tolerance: Patient tolerated treatment well Patient left: with call bell/phone within reach;in chair;with chair alarm set   PT Visit Diagnosis: Other abnormalities of gait and mobility (R26.89)    Time: 1610-9604 PT Time Calculation (min) (ACUTE ONLY): 22 min   Charges:   PT Evaluation $PT Eval Low Complexity: 1 Low   PT General Charges $$ ACUTE PT VISIT: 1 Visit         Neamiah Sciarra, PT  Acute Rehab Dept ([facility]) [phone]  07/05/2023   [facility] 07/05/2023, 2:18 PM

## 2023-07-05 NOTE — Care Management Obs Status (Signed)
 MEDICARE OBSERVATION STATUS NOTIFICATION   Patient Details  Name: Buffie Angola MRN: [id number] Date of Birth: 11-22-1960   Medicare Observation Status Notification Given:  Yes    HOYLEValentina Gu, LCSW 07/05/2023, 10:05 AM

## 2023-07-05 NOTE — Progress Notes (Signed)
   07/04/23 2300  BiPAP/CPAP/SIPAP  $ Non-Invasive Home Ventilator  Initial  BiPAP/CPAP/SIPAP Pt Type Adult  BiPAP/CPAP/SIPAP DREAMSTATIOND (bileval ventilation)  Mask Type Nasal pillows  Dentures removed? Not applicable  Respiratory Rate 18 breaths/min  IPAP 12 cmH20  EPAP 8 cmH2O  FiO2 (%) 32 %  Flow Rate 3 lpm  Patient Home Equipment No  Auto Titrate No (PTs bipap home settings)

## 2023-07-05 NOTE — Progress Notes (Signed)
 Physical Therapy Treatment Patient Details Name: Jeanne [name] MRN: [id number] DOB: [birth year] Today's Date: 07/05/2023   History of Present Illness 63 yo female s/p L DA THA on 07/04/23. PMH: OCD, DM, HTN, RA, CKD    PT Comments  Pt progressing well, meeting goals and is ready to d/c with  family assist from PT standpoint. Reviewed HEP and progression, use of ice at home and mobility as below. All  questions answered   If plan is discharge home, recommend the following: A little help with bathing/dressing/bathroom;Assist for transportation;Help with stairs or ramp for entrance   Can travel by private vehicle        Equipment Recommendations  None recommended by PT    Recommendations for Other Services       Precautions / Restrictions Precautions Precautions: Fall Restrictions Weight Bearing Restrictions Per Provider Order: No Other Position/Activity Restrictions: WBAT     Mobility  Bed Mobility               General bed mobility comments: in recliner    Transfers Overall transfer level: Needs assistance Equipment used: Rolling walker (2 wheels) Transfers: Sit to/from Stand Sit to Stand: Supervision           General transfer comment: cues for hand placement and LLE    Ambulation/Gait Ambulation/Gait assistance: Supervision, Contact guard assist Gait Distance (Feet): 65 Feet Assistive device: Rolling walker (2 wheels) Gait Pattern/deviations: Step-to pattern       General Gait Details: cues  for  seqeunce and RW position   Stairs Stairs: Yes Stairs assistance: Contact guard assist Stair Management: Two rails, Step to pattern, Forwards Number of Stairs: 3 General stair comments: cues for sequence, technique. good stability, no LOB   Wheelchair Mobility     Tilt Bed    Modified Rankin (Stroke Patients Only)       Balance                                            Communication Communication Communication: No  apparent difficulties  Cognition Arousal: Alert Behavior During Therapy: WFL for tasks assessed/performed   PT - Cognitive impairments: No apparent impairments                         Following commands: Intact      Cueing    Exercises Total Joint Exercises Ankle Circles/Pumps: AROM, 10 reps, Both Quad Sets: AROM, Both, 10 reps Heel Slides: AAROM, Left, 10 reps Hip ABduction/ADduction: AROM, AAROM, Left, 10 reps    General Comments        Pertinent Vitals/Pain Pain Assessment Pain Assessment: 0-10 Pain Score: 7  Pain Location: L hip Pain Descriptors / Indicators: Aching, Sore Pain Intervention(s): Limited activity within patient's tolerance, Monitored during session, Premedicated before session, Repositioned, RN gave pain meds during session    Home Living Family/patient expects to be discharged to:: Private residence Living Arrangements: Alone Available Help at Discharge: Family Type of Home: House Home Access: Stairs to enter Entrance Stairs-Rails: Right;Left;Can reach both Secretary/administrator of Steps: 4   Home Layout: One level Home Equipment: Agricultural consultant (2 wheels);BSC/3in1      Prior Function            PT Goals (current goals can now be found in the care plan section) Acute Rehab PT Goals PT Goal Formulation: With  patient Time For Goal Achievement: 07/12/23 Potential to Achieve Goals: Good Progress towards PT goals: Progressing toward goals    Frequency    7X/week      PT Plan      Co-evaluation              AM-PAC PT "6 Clicks" Mobility   Outcome Measure  Help needed turning from your back to your side while in a flat bed without using bedrails?: A Little Help needed moving from lying on your back to sitting on the side of a flat bed without using bedrails?: A Little Help needed moving to and from a bed to a chair (including a wheelchair)?: None Help needed standing up from a chair using your arms (e.g., wheelchair  or bedside chair)?: None Help needed to walk in hospital room?: None Help needed climbing 3-5 steps with a railing? : A Little 6 Click Score: 21    End of Session Equipment Utilized During Treatment: Gait belt Activity Tolerance: Patient tolerated treatment well Patient left: with call bell/phone within reach;in chair;with chair alarm set   PT Visit Diagnosis: Other abnormalities of gait and mobility (R26.89)     Time: 7829-5621 PT Time Calculation (min) (ACUTE ONLY): 29 min  Charges:    $Gait Training: 23-37 mins PT General Charges $$ ACUTE PT VISIT: 1 Visit                     Juanitta Earnhardt, PT  Acute Rehab Dept [facility]) [phone]  07/05/2023    [facility] 07/05/2023, 3:42 PM

## 2023-07-05 NOTE — Progress Notes (Signed)
 Subjective: 1 Day Post-Op Procedure(s) (LRB): ARTHROPLASTY, HIP, TOTAL, ANTERIOR APPROACH (Left) Patient reports pain as mild.   Patient seen in rounds by Dr. Lequita Halt. Patient is well, and has had no acute complaints or problems. Denies chest pain or SOB. No issues overnight. Foley catheter removed this AM. We will begin therapy today  Objective: Vital signs in last 24 hours: Temp:  [97.5 F (36.4 C)-98.8 F (37.1 C)] 98.8 F (37.1 C) (03/18 0924) Pulse Rate:  [50-75] 75 (03/18 0924) Resp:  [11-20] 18 (03/18 0924) BP: (112-139)/(57-76) 139/67 (03/18 0924) SpO2:  [89 %-100 %] 98 % (03/18 0924) FiO2 (%):  [32 %] 32 % (03/17 2300)  Intake/Output from previous day:  Intake/Output Summary (Last 24 hours) at 07/05/2023 1201 Last data filed at 07/05/2023 0552 Gross per 24 hour  Intake 2392.5 ml  Output 2480 ml  Net -87.5 ml     Intake/Output this shift: No intake/output data recorded.  Labs: Recent Labs    07/04/23 1828 07/05/23 0329  HGB 11.6* 10.8*   Recent Labs    07/04/23 1828 07/05/23 0329  WBC 7.0 8.2  RBC 3.45* 3.25*  HCT 34.0* 32.4*  PLT 159 151   Recent Labs    07/04/23 1828 07/05/23 0329  NA  --  135  K  --  4.4  CL  --  103  CO2  --  23  BUN  --  16  CREATININE 1.01* 0.90  GLUCOSE  --  129*  CALCIUM  --  8.8*   No results for input(s): "LABPT", "INR" in the last 72 hours.  Exam: General - Patient is Alert and Oriented Extremity - Neurologically intact Neurovascular intact Sensation intact distally Dorsiflexion/Plantar flexion intact Dressing - dressing C/D/I Motor Function - intact, moving foot and toes well on exam.   Past Medical History:  Diagnosis Date   Antiphospholipid antibody syndrome (HCC)    Anxiety    Arthritis    Bilateral swelling of feet    Carpal tunnel syndrome 01/02/2014   Bilateral   Clotting disorder (HCC)    Collagen disease (HCC) 04/21/2021   Constipation    Depression    Diabetes mellitus type 2, insulin  dependent (HCC) 02/26/2019   Difficult intubation 07/09/2013   Glidescope used, see Anesthesia note   DVT (deep venous thrombosis) (HCC) 2014   LEFT LEG   Gastritis    GERD (gastroesophageal reflux disease)    Glaucoma    Heart murmur    History of blood clots    Hyperlipidemia    Hypertension    Hypokalemia 03/05/2013   Kidney disease    stage ll   Neuromuscular disorder (HCC)    " NEUROPATHY IN  MY HANDS"   Obsessive-compulsive disorder    Osteoarthritis    Rheumatoid arthritis (HCC) 2016   Schizo-affective psychosis (HCC)    Schizoaffective disorder (HCC)    Scleritis of both eyes    Sleep apnea    Bipap   Suppurative hidradenitis    Undifferentiated connective tissue disease (HCC)     Assessment/Plan: 1 Day Post-Op Procedure(s) (LRB): ARTHROPLASTY, HIP, TOTAL, ANTERIOR APPROACH (Left) Principal Problem:   OA (osteoarthritis) of hip Active Problems:   Osteoarthritis of left hip  Estimated body mass index is 33.21 kg/m as calculated from the following:   Height as of this encounter: 5\' 9"  (1.753 m).   Weight as of this encounter: 102 kg. Advance diet Up with therapy D/C IV fluids  DVT Prophylaxis - Lovenox Weight bearing  as tolerated. Begin therapy.  Plan is to go Home after hospital stay. Plan for discharge with HEP later today if progresses with therapy and meeting goals. Follow-up in the office in 2 weeks.  The PDMP database was reviewed today prior to any opioid medications being prescribed to this patient.  Arther Abbott, PA-C Orthopedic Surgery [phone] 07/05/2023, 12:01 PM

## 2023-07-05 NOTE — Plan of Care (Signed)
  Problem: Education: Goal: Knowledge of General Education information will improve Description: Including pain rating scale, medication(s)/side effects and non-pharmacologic comfort measures Outcome: Progressing   Problem: Health Behavior/Discharge Planning: Goal: Ability to manage health-related needs will improve Outcome: Progressing   Problem: Clinical Measurements: Goal: Ability to maintain clinical measurements within normal limits will improve Outcome: Progressing Goal: Will remain free from infection Outcome: Progressing Goal: Diagnostic test results will improve Outcome: Progressing Goal: Respiratory complications will improve Outcome: Progressing Goal: Cardiovascular complication will be avoided Outcome: Progressing   Problem: Activity: Goal: Risk for activity intolerance will decrease Outcome: Adequate for Discharge   Problem: Nutrition: Goal: Adequate nutrition will be maintained Outcome: Completed/Met   Problem: Coping: Goal: Level of anxiety will decrease Outcome: Progressing   Problem: Elimination: Goal: Will not experience complications related to bowel motility Outcome: Progressing Goal: Will not experience complications related to urinary retention Outcome: Progressing   Problem: Pain Managment: Goal: General experience of comfort will improve and/or be controlled Outcome: Progressing   Problem: Safety: Goal: Ability to remain free from injury will improve Outcome: Progressing   Problem: Skin Integrity: Goal: Risk for impaired skin integrity will decrease Outcome: Progressing   Problem: Education: Goal: Knowledge of the prescribed therapeutic regimen will improve Outcome: Progressing Goal: Understanding of discharge needs will improve Outcome: Progressing Goal: Individualized Educational Video(s) Outcome: Completed/Met   Problem: Activity: Goal: Ability to avoid complications of mobility impairment will improve Outcome: Progressing Goal:  Ability to tolerate increased activity will improve Outcome: Progressing   Problem: Clinical Measurements: Goal: Postoperative complications will be avoided or minimized Outcome: Progressing   Problem: Pain Management: Goal: Pain level will decrease with appropriate interventions Outcome: Progressing   Problem: Skin Integrity: Goal: Will show signs of wound healing Outcome: Progressing

## 2023-07-06 NOTE — Discharge Summary (Signed)
 Patient ID: Jeanne [name] MRN: [id number] DOB/AGE: September 30, 1960 63 y.o.  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   Difficult intubation 07/09/2013   Glidescope used, see Anesthesia note   DVT (deep venous thrombosis) (HCC) 2014   LEFT LEG   Gastritis    GERD (gastroesophageal reflux disease)    Glaucoma    Heart murmur    History of blood clots    Hyperlipidemia    Hypertension    Hypokalemia 03/05/2013   Kidney disease    stage ll   Neuromuscular disorder (HCC)    " NEUROPATHY IN  MY HANDS"   Obsessive-compulsive disorder    Osteoarthritis    Rheumatoid arthritis (HCC) 2016   Schizo-affective psychosis (HCC)    Schizoaffective disorder (HCC)    Scleritis of both eyes    Sleep apnea    Bipap   Suppurative hidradenitis    Undifferentiated connective tissue disease (HCC)     Surgeries: Procedure(s): ARTHROPLASTY, HIP, TOTAL, ANTERIOR APPROACH on 07/04/2023   Consultants:   Discharged Condition: Improved  Hospital Course: Ayame Denise [name] is an 63 y.o. female who was admitted 07/04/2023 for operative treatment ofOA (osteoarthritis) of hip. Patient has severe unremitting pain that affects sleep, daily activities, and work/hobbies. After pre-op clearance the patient was taken to the operating room on 07/04/2023 and underwent  Procedure(s): ARTHROPLASTY, HIP, TOTAL, ANTERIOR APPROACH.    Patient was given perioperative antibiotics:  Anti-infectives (From  admission, onward)    Start     Dose/Rate Route Frequency Ordered Stop   07/04/23 2000  ceFAZolin (ANCEF) IVPB 2g/100 mL premix        2 g 200 mL/hr over 30 Minutes Intravenous Every 6 hours 07/04/23 1735 07/05/23 0255   07/04/23 1145  ceFAZolin (ANCEF) IVPB 2g/100 mL premix        2 g 200 mL/hr over 30 Minutes Intravenous On call to O.R. 07/04/23 1133 07/04/23 1435        Patient was given sequential compression devices, early ambulation, and chemoprophylaxis to prevent DVT.  Patient benefited maximally from hospital stay and there were no complications.    Recent vital signs: Patient Vitals for the past 24 hrs:  BP Temp Temp src Pulse Resp SpO2  07/05/23 1347 (!) 147/60 98.9 F (37.2 C) Oral 82 16 99 %     Recent laboratory studies:  Recent Labs    07/04/23 1828 07/05/23 0329  WBC 7.0 8.2  HGB 11.6* 10.8*  HCT 34.0* 32.4*  PLT 159 151  NA  --  135  K  --  4.4  CL  --  103  CO2  --  23  BUN  --  16  CREATININE 1.01* 0.90  GLUCOSE  --  129*  CALCIUM  --  8.8*     Discharge Medications:   Allergies as of 07/05/2023       Reactions   Ace Inhibitors Anaphylaxis   Arava [leflunomide] Other (See Comments)  Nephritis and kidney clearance issues   Travatan [travoprost] Other (See Comments)   Red eyes, irritation   Methotrexate Other (See Comments)   Renal issues        Medication List     STOP taking these medications    aspirin EC 81 MG tablet       TAKE these medications    amLODipine 5 MG tablet Commonly known as: NORVASC Take 5 mg by mouth every morning.   ARIPiprazole 30 MG tablet Commonly known as: ABILIFY Take 1 tablet (30 mg total) by mouth daily.   atorvastatin 40 MG tablet Commonly known as: LIPITOR Take 1 tablet (40 mg total) by mouth daily. What changed: when to take this   buPROPion 150 MG 24 hr tablet Commonly known as: WELLBUTRIN XL Take 3 tablets (450 mg total) by mouth daily.   CALCIUM + D3 PO Take 1 tablet by mouth in  the morning.   COQ-10 PO Take 300 mg by mouth in the morning.   DULoxetine 30 MG capsule Commonly known as: CYMBALTA Take 3 capsules (90 mg total) by mouth daily.   enoxaparin 150 MG/ML injection Commonly known as: LOVENOX Inject 1 mL (150 mg total) into the skin daily.   esomeprazole 40 MG capsule Commonly known as: NEXIUM Take 40 mg by mouth daily before breakfast.   furosemide 40 MG tablet Commonly known as: LASIX Take 40 mg by mouth in the morning.   GLUCOSAMINE-CHONDROITIN PO Take 2 tablets by mouth every morning.   inFLIXimab-dyyb 3 mg/kg in sodium chloride 0.9 % Inject 3 mg/kg into the vein every 8 (eight) weeks.   Krill Oil 300 MG Caps Take 300 mg by mouth in the morning.   levobunolol 0.5 % ophthalmic solution Commonly known as: BETAGAN Place 1 drop into both eyes every morning.   methocarbamol 500 MG tablet Commonly known as: ROBAXIN Take 1 tablet (500 mg total) by mouth every 6 (six) hours as needed for muscle spasms.   Mounjaro 12.5 MG/0.5ML Pen Generic drug: tirzepatide Inject 12.5 mg into the skin once a week. What changed: when to take this   multivitamin with minerals Tabs tablet Take 1 tablet by mouth in the morning.   NIFEdipine 60 MG 24 hr tablet Commonly known as: PROCARDIA XL/NIFEDICAL XL Take 60 mg by mouth every morning.   ondansetron 4 MG tablet Commonly known as: ZOFRAN Take 1 tablet (4 mg total) by mouth every 6 (six) hours as needed for nausea.   oxyCODONE 5 MG immediate release tablet Commonly known as: Oxy IR/ROXICODONE Take 1-2 tablets (5-10 mg total) by mouth every 6 (six) hours as needed for severe pain (pain score 7-10).   oxyCODONE-acetaminophen 5-325 MG tablet Commonly known as: PERCOCET/ROXICET Take 1 tablet by mouth 3 (three) times daily as needed for severe pain (pain score 7-10) (pain.).   PRESCRIPTION MEDICATION Inhale 1 application into the lungs at bedtime. BiPAP setting 8/12   QUEtiapine 300 MG  tablet Commonly known as: SEROQUEL Take 2 tablets (600 mg total) by mouth at bedtime.   RENFLEXIS IV Inject into the vein.   Turmeric Powd Take 1,500 mg by mouth in the morning.   verapamil 180 MG CR tablet Commonly known as: CALAN-SR Take 1 tablet (180 mg total) by mouth daily.               Discharge Care Instructions  (From admission, onward)           Start     Ordered  07/05/23 0000  Weight bearing as tolerated        07/05/23 1205   07/05/23 0000  Change dressing       Comments: You have an adhesive waterproof bandage over the incision. Leave this in place until your first follow-up appointment. Once you remove this you will not need to place another bandage.   07/05/23 1205            Diagnostic Studies: DG Pelvis Portable Result Date: 07/04/2023 CLINICAL DATA:  Status post total left hip arthroplasty. EXAM: PORTABLE PELVIS 1-2 VIEWS COMPARISON:  None Available. FINDINGS: Total hip arthroplasty changes are noted on the left. Alignment appears anatomic. No acute fracture or dislocation is seen. Air is noted in the soft tissues over the lateral left hip. There are degenerative changes at the right hip and lower lumbar spine. IMPRESSION: Status post left total hip arthroplasty. Electronically Signed   By: Thornell Sartorius M.D.   On: 07/04/2023 20:58   DG HIP UNILAT WITH PELVIS 1V LEFT Result Date: 07/04/2023 CLINICAL DATA:  [id number] Surgery, elective 284132 EXAM: DG HIP (WITH OR WITHOUT PELVIS) 1V*L* COMPARISON:  08/04/2015 FINDINGS: Two C-arm fluoroscopic images were obtained intraoperatively and submitted for post operative interpretation. Images demonstrate left total hip arthroplasty hardware in place. 12 seconds of fluoroscopy time utilized. Radiation dose: 1.96 mGy. Please see the performing provider's procedural report for further detail. IMPRESSION: Intraoperative fluoroscopic guidance for left total hip arthroplasty. Electronically Signed   By: Duanne Guess  D.O.   On: 07/04/2023 19:32   DG C-Arm 1-60 Min-No Report Result Date: 07/04/2023 Fluoroscopy was utilized by the requesting physician.  No radiographic interpretation.   DG C-Arm 1-60 Min-No Report Result Date: 07/04/2023 Fluoroscopy was utilized by the requesting physician.  No radiographic interpretation.    Disposition: Discharge disposition: 01-Home or Self Care       Discharge Instructions     Call MD / Call 911   Complete by: As directed    If you experience chest pain or shortness of breath, CALL 911 and be transported to the hospital emergency room.  If you develope a fever above 101 F, pus (white drainage) or increased drainage or redness at the wound, or calf pain, call your surgeon's office.   Change dressing   Complete by: As directed    You have an adhesive waterproof bandage over the incision. Leave this in place until your first follow-up appointment. Once you remove this you will not need to place another bandage.   Constipation Prevention   Complete by: As directed    Drink plenty of fluids.  Prune juice may be helpful.  You may use a stool softener, such as Colace (over the counter) 100 mg twice a day.  Use MiraLax (over the counter) for constipation as needed.   Diet - low sodium heart healthy   Complete by: As directed    Do not sit on low chairs, stoools or toilet seats, as it may be difficult to get up from low surfaces   Complete by: As directed    Driving restrictions   Complete by: As directed    No driving for two weeks   Post-operative opioid taper instructions:   Complete by: As directed    POST-OPERATIVE OPIOID TAPER INSTRUCTIONS: It is important to wean off of your opioid medication as soon as possible. If you do not need pain medication after your surgery it is ok to stop day one. Opioids include: Codeine, Hydrocodone(Norco, Vicodin), Oxycodone(Percocet,  oxycontin) and hydromorphone amongst others.  Long term and even short term use of opiods  can cause: Increased pain response Dependence Constipation Depression Respiratory depression And more.  Withdrawal symptoms can include Flu like symptoms Nausea, vomiting And more Techniques to manage these symptoms Hydrate well Eat regular healthy meals Stay active Use relaxation techniques(deep breathing, meditating, yoga) Do Not substitute Alcohol to help with tapering If you have been on opioids for less than two weeks and do not have pain than it is ok to stop all together.  Plan to wean off of opioids This plan should start within one week post op of your joint replacement. Maintain the same interval or time between taking each dose and first decrease the dose.  Cut the total daily intake of opioids by one tablet each day Next start to increase the time between doses. The last dose that should be eliminated is the evening dose.      TED hose   Complete by: As directed    Use stockings (TED hose) for three weeks on both leg(s).  You may remove them at night for sleeping.   Weight bearing as tolerated   Complete by: As directed         Follow-up Information     Aluisio, Homero Fellers, MD. Schedule an appointment as soon as possible for a visit in 2 week(s).   Specialty: Orthopedic Surgery Contact information: [address] [place] [address] [place] [address] [phone]                  Signed: Arther Abbott 07/06/2023, 10:01 AM

## 2023-07-20 ENCOUNTER — Ambulatory Visit (INDEPENDENT_AMBULATORY_CARE_PROVIDER_SITE_OTHER): Payer: Medicare Other | Admitting: Psychology

## 2023-07-20 DIAGNOSIS — F331 Major depressive disorder, recurrent, moderate: Secondary | ICD-10-CM

## 2023-07-20 NOTE — Progress Notes (Signed)
 [facility] Counselor/Therapist Progress Note  Patient ID: Jeanne [name], MRN: [id number],    Date: 07/20/2023  Time Spent: 2:00pm-2:50pm   50 minutes   Treatment Type: Individual Therapy  Reported Symptoms: stress  Mental Status Exam: Appearance:  Casual     Behavior: Appropriate  Motor: Normal  Speech/Language:  Normal Rate  Affect: Appropriate  Mood: normal  Thought process: normal  Thought content:   WNL  Sensory/Perceptual disturbances:   WNL  Orientation: oriented to person, place, time/date, and situation  Attention: Good  Concentration: Good  Memory: WNL  Fund of knowledge:  Good  Insight:   Good  Judgment:  Good  Impulse Control: Good   Risk Assessment: Danger to Self:  No Self-injurious Behavior: No Danger to Others: No Duty to Warn:no Physical Aggression / Violence:No  Access to Firearms a concern: No  Gang Involvement:No   Subjective: Pt present for face-to-face individual therapy via in person.   Pt talked about having a hip replacement on 3/17.   Pt is recovering well and walked into the office using a cane.   Pt can get back to swimming in about 3-4 weeks.   Pt talked about her church involvement.  Pt has not participated since her surgery.   Pt talked about her nephew.  Pt has not talked with her nephew about wanting him to move out.   Pt is upset with her self about avoiding the conversations.  Helped pt process her feelings and relationship dynamics.   Suggested pt write a letter to her nephew that they can then discuss.   Pt talked about her relationships.  There are a few relationships that are not good for pt and she states she does not have the band width for.  She has decided to cut those people off in order to protect her own mental health.  Addressed how pt can navigate setting boundaries in those relationships.   Worked on self care strategies.   Provided supportive therapy.    Interventions: Cognitive Behavioral  Therapy and Insight-Oriented  Diagnosis:  F33.1  Plan of Care: Recommend ongoing therapy.   Pt participated in setting treatment goals and agrees with treatment plan.   Plan to continue to meet monthly.    Treatment Plan  (Treatment Plan Target Date:  10/06/2023) Client Abilities/Strengths  Pt is bright, engaging, and motivated for therapy.   Client Treatment Preferences  Individual therapy.  Client Statement of Needs  Improve coping skills.  Symptoms  Depressed or irritable mood.  Problems Addressed  Unipolar Depression Goals 1. Alleviate depressive symptoms and return to previous level of effective functioning. 2. Appropriately grieve the loss in order to normalize mood and to return to previously adaptive level of functioning. Objective Learn and implement behavioral strategies to overcome depression. Target Date: 2023-10-06 Frequency: Monthly  Progress: 55 Modality: individual  Related Interventions Engage the client in "behavioral activation," increasing his/her activity level and contact with sources of reward, while identifying processes that inhibit activation.  Use behavioral techniques such as instruction, rehearsal, role-playing, role reversal, as needed, to facilitate activity in the client's daily life; reinforce success. Assist the client in developing skills that increase the likelihood of deriving pleasure from behavioral activation (e.g., assertiveness skills, developing an exercise plan, less internal/more external focus, increased social involvement); reinforce success. Objective Identify important people in life, past and present, and describe the quality, good and poor, of those relationships. Target Date: 2023-10-06 Frequency: Monthly  Progress: 55 Modality: individual  Related Interventions  Conduct Interpersonal Therapy beginning with the assessment of the client's "interpersonal inventory" of important past and present relationships; develop a case formulation  linking depression to grief, interpersonal role disputes, role transitions, and/or interpersonal deficits). Objective Learn and implement problem-solving and decision-making skills. Target Date: 2023-10-06 Frequency: Monthly  Progress: 55 Modality: individual  Related Interventions Conduct Problem-Solving Therapy using techniques such as psychoeducation, modeling, and role-playing to teach client problem-solving skills (i.e., defining a problem specifically, generating possible solutions, evaluating the pros and cons of each solution, selecting and implementing a plan of action, evaluating the efficacy of the plan, accepting or revising the plan); role-play application of the problem-solving skill to a real life issue. Encourage in the client the development of a positive problem orientation in which problems and solving them are viewed as a natural part of life and not something to be feared, despaired, or avoided. 3. Develop healthy interpersonal relationships that lead to the alleviation and help prevent the relapse of depression. 4. Develop healthy thinking patterns and beliefs about self, others, and the world that lead to the alleviation and help prevent the relapse of depression. 5. Recognize, accept, and cope with feelings of depression. Diagnosis F33.1  Conditions For Discharge Achievement of treatment goals and objectives   Salomon Fick, LCSW

## 2023-08-04 ENCOUNTER — Telehealth: Payer: Self-pay | Admitting: Psychiatry

## 2023-08-11 ENCOUNTER — Telehealth (INDEPENDENT_AMBULATORY_CARE_PROVIDER_SITE_OTHER): Payer: Self-pay | Admitting: Psychiatry

## 2023-08-11 DIAGNOSIS — F331 Major depressive disorder, recurrent, moderate: Secondary | ICD-10-CM

## 2023-08-11 NOTE — Progress Notes (Signed)
 No response to call or text or video invite

## 2023-08-24 ENCOUNTER — Ambulatory Visit (INDEPENDENT_AMBULATORY_CARE_PROVIDER_SITE_OTHER): Payer: Medicare Other | Admitting: Psychology

## 2023-08-24 DIAGNOSIS — F331 Major depressive disorder, recurrent, moderate: Secondary | ICD-10-CM | POA: Diagnosis not present

## 2023-08-24 NOTE — Progress Notes (Signed)
 [facility] Counselor/Therapist Progress Note  Patient ID: Jeanne [name], MRN: [id number],    Date: 08/24/2023  Time Spent: 3:00pm-3:50pm   50 minutes   Treatment Type: Individual Therapy  Reported Symptoms: stress  Mental Status Exam: Appearance:  Casual     Behavior: Appropriate  Motor: Normal  Speech/Language:  Normal Rate  Affect: Appropriate  Mood: normal  Thought process: normal  Thought content:   WNL  Sensory/Perceptual disturbances:   WNL  Orientation: oriented to person, place, time/date, and situation  Attention: Good  Concentration: Good  Memory: WNL  Fund of knowledge:  Good  Insight:   Good  Judgment:  Good  Impulse Control: Good   Risk Assessment: Danger to Self:  No Self-injurious Behavior: No Danger to Others: No Duty to Warn:no Physical Aggression / Violence:No  Access to Firearms a concern: No  Gang Involvement:No   Subjective: Pt present for face-to-face individual therapy in person.  Pt talked about her sexuality.  She identifies as sapio-sexual.  She tried to talk to her sisters about her sexuality and they did not respond in the way pt wanted.   Addressed the interactions and identified that they were uncomfortable with talking about sexuality.   Helped pt depersonalize their response.   Pt talked about her nephew Jeanne [name] who lives with her.  He has been smoking pot in her yard and leaving joint butts in the yard.  Pt confronted him and told him he needs to move out.  Pt is going to have Jeanne [name] sign a contract to move out June 30th.  Pt is upset with how irresponsible Jeanne [name] is and does not want the stress of him living with her.  Pt is conflict avoidant so it may be hard for her to hold to her intentions of having him move out.  Addressed how pt can navigate setting boundaries in that relationship Pt has been working on weight loss and has lost 62 lbs.  She is feeling better about herself and healthier. .   Worked on self care  strategies.   Provided supportive therapy.    Interventions: Cognitive Behavioral Therapy and Insight-Oriented  Diagnosis:  F33.1  Plan of Care: Recommend ongoing therapy.   Pt participated in setting treatment goals and agrees with treatment plan.   Plan to continue to meet monthly.    Treatment Plan  (Treatment Plan Target Date:  10/06/2023) Client Abilities/Strengths  Pt is bright, engaging, and motivated for therapy.   Client Treatment Preferences  Individual therapy.  Client Statement of Needs  Improve coping skills.  Symptoms  Depressed or irritable mood.  Problems Addressed  Unipolar Depression Goals 1. Alleviate depressive symptoms and return to previous level of effective functioning. 2. Appropriately grieve the loss in order to normalize mood and to return to previously adaptive level of functioning. Objective Learn and implement behavioral strategies to overcome depression. Target Date: 2023-10-06 Frequency: Monthly  Progress: 55 Modality: individual  Related Interventions Engage the client in "behavioral activation," increasing his/her activity level and contact with sources of reward, while identifying processes that inhibit activation.  Use behavioral techniques such as instruction, rehearsal, role-playing, role reversal, as needed, to facilitate activity in the client's daily life; reinforce success. Assist the client in developing skills that increase the likelihood of deriving pleasure from behavioral activation (e.g., assertiveness skills, developing an exercise plan, less internal/more external focus, increased social involvement); reinforce success. Objective Identify important people in life, past and present, and describe the quality, good and poor, of  those relationships. Target Date: 2023-10-06 Frequency: Monthly  Progress: 55 Modality: individual  Related Interventions Conduct Interpersonal Therapy beginning with the assessment of the client's  "interpersonal inventory" of important past and present relationships; develop a case formulation linking depression to grief, interpersonal role disputes, role transitions, and/or interpersonal deficits). Objective Learn and implement problem-solving and decision-making skills. Target Date: 2023-10-06 Frequency: Monthly  Progress: 55 Modality: individual  Related Interventions Conduct Problem-Solving Therapy using techniques such as psychoeducation, modeling, and role-playing to teach client problem-solving skills (i.e., defining a problem specifically, generating possible solutions, evaluating the pros and cons of each solution, selecting and implementing a plan of action, evaluating the efficacy of the plan, accepting or revising the plan); role-play application of the problem-solving skill to a real life issue. Encourage in the client the development of a positive problem orientation in which problems and solving them are viewed as a natural part of life and not something to be feared, despaired, or avoided. 3. Develop healthy interpersonal relationships that lead to the alleviation and help prevent the relapse of depression. 4. Develop healthy thinking patterns and beliefs about self, others, and the world that lead to the alleviation and help prevent the relapse of depression. 5. Recognize, accept, and cope with feelings of depression. Diagnosis F33.1  Conditions For Discharge Achievement of treatment goals and objectives   Willey Harrier, LCSW

## 2023-09-01 ENCOUNTER — Other Ambulatory Visit: Payer: Self-pay

## 2023-09-01 ENCOUNTER — Other Ambulatory Visit (HOSPITAL_COMMUNITY): Payer: Self-pay

## 2023-09-01 MED ORDER — MOUNJARO 15 MG/0.5ML ~~LOC~~ SOAJ
15.0000 mg | SUBCUTANEOUS | 1 refills | Status: AC
  Filled 2023-09-01: qty 6, 84d supply, fill #0

## 2023-09-05 ENCOUNTER — Other Ambulatory Visit (HOSPITAL_COMMUNITY): Payer: Self-pay

## 2023-09-16 ENCOUNTER — Encounter: Payer: Self-pay | Admitting: Psychiatry

## 2023-09-16 ENCOUNTER — Telehealth: Admitting: Psychiatry

## 2023-09-16 DIAGNOSIS — F251 Schizoaffective disorder, depressive type: Secondary | ICD-10-CM | POA: Diagnosis not present

## 2023-09-16 DIAGNOSIS — F17210 Nicotine dependence, cigarettes, uncomplicated: Secondary | ICD-10-CM | POA: Diagnosis not present

## 2023-09-16 DIAGNOSIS — F17200 Nicotine dependence, unspecified, uncomplicated: Secondary | ICD-10-CM

## 2023-09-16 DIAGNOSIS — F429 Obsessive-compulsive disorder, unspecified: Secondary | ICD-10-CM | POA: Diagnosis not present

## 2023-09-16 MED ORDER — DULOXETINE HCL 30 MG PO CPEP: 90.0000 mg | ORAL_CAPSULE | Freq: Every day | ORAL | 1 refills | Status: DC

## 2023-09-16 MED ORDER — BUPROPION HCL ER (XL) 150 MG PO TB24
450.0000 mg | ORAL_TABLET | Freq: Every day | ORAL | 1 refills | Status: DC
Start: 2023-09-16 — End: 2024-03-06

## 2023-09-16 MED ORDER — QUETIAPINE FUMARATE 300 MG PO TABS: 600.0000 mg | ORAL_TABLET | Freq: Every day | ORAL | 1 refills | Status: DC

## 2023-09-16 MED ORDER — ARIPIPRAZOLE 30 MG PO TABS
30.0000 mg | ORAL_TABLET | Freq: Every day | ORAL | 1 refills | Status: AC
Start: 2023-09-16 — End: ?

## 2023-09-16 NOTE — Progress Notes (Signed)
 Virtual Visit via Video Note  I connected with Jeanne [name] on 09/16/23 at  9:40 AM EDT by a video enabled telemedicine application and verified that I am speaking with the correct person using two identifiers.  Location Provider Location : [facility] Patient Location : Home  Participants: Patient , Provider    I discussed the limitations of evaluation and management by telemedicine and the availability of in person appointments. The patient expressed understanding and agreed to proceed.    I discussed the assessment and treatment plan with the patient. The patient was provided an opportunity to ask questions and all were answered. The patient agreed with the plan and demonstrated an understanding of the instructions.   The patient was advised to call back or seek an in-person evaluation if the symptoms worsen or if the condition fails to improve as anticipated.   [facility] MD OP Progress Note  09/16/2023 10:32 AM Jeanne [name]  MRN:  [id number]  Chief Complaint:  Chief Complaint  Patient presents with   Follow-up   Depression   Anxiety   Medication Refill   Discussed the use of AI scribe software for clinical note transcription with the patient, who gave verbal consent to proceed.  History of Present Illness Jeanne [name] is a 63 year old African-American female on disability, lives in [place], has a history of schizoaffective disorder, OCD, hypertension, hyperlipidemia, diabetes mellitus, glaucoma, history of obstructive sleep apnea, chronic pain, rheumatoid arthritis was evaluated by telemedicine today..  She has a history of schizoaffective disorder and obsessive-compulsive disorder (OCD). Her past symptoms included auditory and visual hallucinations and OCD-related counting behaviors due to fears of her 'heart stopping.' She is currently stable with no acute  issues. She is on Seroquel  600 mg, Abilify  30 mg, Wellbutrin  XL 450 mg, and Cymbalta  90 mg, which  she prefers to continue as it helps maintain her stability. No current hallucinations, paranoia, or delusions are reported.  She has a history of tobacco use, currently smoking ten cigarettes a day. Previous cessation attempts with lozenges and Chantix were unsuccessful, with lozenges aggravating her gastritis. She is now using a plan and willpower to reduce smoking.  For diabetes management, she is on Mounjaro , which was recently increased from 12 mg to 15 mg weekly. Her hemoglobin A1c is well-controlled at 5.3%.  She has been prescribed Lipitor, with the dose recently increased from 20 mg to 40 mg. Her cholesterol levels were last checked in July 2024, showing good results.   She lives alone, with occasional visits from her nephew, and is active in her [employer] and church activities. She manages her home and bills independently and maintains good social interactions. No movement problems, muscular rigidity, or stiffness are reported.  She appeared to be alert, oriented to person place time situation and was able to answer questions appropriately.  Memory seem to be intact.   Visit Diagnosis:    ICD-10-CM   1. Schizoaffective disorder, depressive type (HCC)  F25.1 ARIPiprazole  (ABILIFY ) 30 MG tablet    buPROPion  (WELLBUTRIN  XL) 150 MG 24 hr tablet    DULoxetine  (CYMBALTA ) 30 MG capsule    QUEtiapine  (SEROQUEL ) 300 MG tablet    2. Obsessive-compulsive disorder with good or fair insight  F42.9 DULoxetine  (CYMBALTA ) 30 MG capsule    3. Tobacco use disorder  F17.200       Past Psychiatric History: I have reviewed past psychiatric history from progress note on 03/14/2023.  Denies previous suicide attempts.  Past trials of  medications include risperidone, Zyprexa, Prolixin, Prozac, Zoloft, Effexor , Lamictal.  Past Medical History:  Past Medical History:  Diagnosis Date   Antiphospholipid antibody syndrome (HCC)    Anxiety    Arthritis    Bilateral swelling of feet    Carpal tunnel  syndrome 01/02/2014   Bilateral   Clotting disorder (HCC)    Collagen disease (HCC) 04/21/2021   Constipation    Depression    Diabetes mellitus type 2, insulin  dependent (HCC) 02/26/2019   Difficult intubation 07/09/2013   Glidescope used, see Anesthesia note   DVT (deep venous thrombosis) (HCC) 2014   LEFT LEG   Gastritis    GERD (gastroesophageal reflux disease)    Glaucoma    Heart murmur    History of blood clots    Hyperlipidemia    Hypertension    Hypokalemia 03/05/2013   Kidney disease    stage ll   Neuromuscular disorder (HCC)    " NEUROPATHY IN  MY HANDS"   Obsessive-compulsive disorder    Osteoarthritis    Rheumatoid arthritis (HCC) 2016   Schizo-affective psychosis (HCC)    Schizoaffective disorder (HCC)    Scleritis of both eyes    Sleep apnea    Bipap   Suppurative hidradenitis    Undifferentiated connective tissue disease (HCC)     Past Surgical History:  Procedure Laterality Date   BUNIONECTOMY     CERVICAL CONIZATION W/BX     COLONOSCOPY WITH PROPOFOL  N/A 03/13/2019   Procedure: COLONOSCOPY WITH PROPOFOL ;  Surgeon: Albertina Hugger, MD;  Location: [facility];  Service: Gastroenterology;  Laterality: N/A;   LAPAROSCOPIC ADRENALECTOMY Left 07/09/2013   Procedure: LAPAROSCOPIC LEFT  ADRENALECTOMY;  Surgeon: Keitha Pata, MD;  Location: [facility];  Service: General;  Laterality: Left;   POLYPECTOMY  03/13/2019   Procedure: POLYPECTOMY;  Surgeon: Albertina Hugger, MD;  Location: [facility];  Service: Gastroenterology;;   sweat gland removal  1975   TOTAL HIP ARTHROPLASTY Left 07/04/2023   Procedure: ARTHROPLASTY, HIP, TOTAL, ANTERIOR APPROACH;  Surgeon: Liliane Rei, MD;  Location: [facility];  Service: Orthopedics;  Laterality: Left;    Family Psychiatric History: I have reviewed family psychiatric history from progress note on 03/14/2023.  Family History:  Family History  Problem Relation Age of Onset   Diabetes Mother    Hypertension Mother     Hyperlipidemia Mother    Stroke Mother    Depression Mother    Eating disorder Mother    Hypertension Father    Hypertension Sister    Hypertension Sister    Cancer Maternal Aunt        breast ca   Breast cancer Maternal Aunt    Seizures Maternal Uncle    Heart attack Maternal Grandfather 42   Heart attack Maternal Grandmother 54   Bipolar disorder Nephew    Alcohol abuse Neg Hx    Anxiety disorder Neg Hx    Drug abuse Neg Hx    Schizophrenia Neg Hx    Colon cancer Neg Hx    Esophageal cancer Neg Hx    Stomach cancer Neg Hx    Rectal cancer Neg Hx     Social History: I have reviewed social history from progress note on 03/14/2023. Social History   Socioeconomic History   Marital status: Single    Spouse name: Not on file   Number of children: 0   Years of education: college   Highest education level: Not on file  Occupational History  Occupation: Retired  Tobacco Use   Smoking status: Every Day    Current packs/day: 0.50    Average packs/day: 0.5 packs/day for 25.0 years (12.5 ttl pk-yrs)    Types: Cigarettes   Smokeless tobacco: Never   Tobacco comments:    Slowly working thru it on her own  Vaping Use   Vaping status: Never Used  Substance and Sexual Activity   Alcohol use: Yes    Comment: 2-3 times a month   Drug use: No   Sexual activity: Not Currently  Other Topics Concern   Not on file  Social History Narrative   Pt is living in [place] with mother. Born and raised in [place] by parents. Childhood was fine. Pt has 3 older sisters. Pt has a law degree. 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 Pt is currently on disability for mental health issues. Single, no kids.    Social Drivers of Corporate investment banker Strain: Not on file  Food Insecurity: No Food Insecurity (07/04/2023)   Hunger Vital Sign    Worried About Running Out of Food in the Last Year: Never true    Ran Out of Food in the Last Year: Never true  Transportation Needs: No  Transportation Needs (07/04/2023)   PRAPARE - Administrator, Civil Service (Medical): No    Lack of Transportation (Non-Medical): No  Physical Activity: Not on file  Stress: Not on file  Social Connections: Not on file    Allergies:  Allergies  Allergen Reactions   Ace Inhibitors Anaphylaxis   Arava [Leflunomide] Other (See Comments)    Nephritis and kidney clearance issues   Travatan [Travoprost] Other (See Comments)    Red eyes, irritation    Methotrexate  Other (See Comments)    Renal issues    Metabolic Disorder Labs: Lab Results  Component Value Date   HGBA1C 5.3 06/23/2023   MPG 105.41 06/23/2023   No results found for: "PROLACTIN" Lab Results  Component Value Date   CHOL 177 08/08/2020   TRIG 94 08/08/2020   HDL 63 08/08/2020   CHOLHDL 2.4 01/02/2020   LDLCALC 95 08/08/2020   LDLCALC 90 01/02/2020   Lab Results  Component Value Date   TSH 1.13 08/08/2020   TSH 0.783 01/02/2020    Therapeutic Level Labs: No results found for: "LITHIUM" No results found for: "VALPROATE" No results found for: "CBMZ"  Current Medications: Current Outpatient Medications  Medication Sig Dispense Refill   amLODipine  (NORVASC ) 5 MG tablet Take 5 mg by mouth every morning.      ARIPiprazole  (ABILIFY ) 30 MG tablet Take 1 tablet (30 mg total) by mouth daily. 90 tablet 1   atorvastatin  (LIPITOR) 40 MG tablet Take 1 tablet (40 mg total) by mouth daily. (Patient taking differently: Take 40 mg by mouth at bedtime.) 90 tablet 3   buPROPion  (WELLBUTRIN  XL) 150 MG 24 hr tablet Take 3 tablets (450 mg total) by mouth daily. 270 tablet 1   Calcium  Carb-Cholecalciferol (CALCIUM  + D3 PO) Take 1 tablet by mouth in the morning.     Coenzyme Q10 (COQ-10 PO) Take 300 mg by mouth in the morning.     DULoxetine  (CYMBALTA ) 30 MG capsule Take 3 capsules (90 mg total) by mouth daily. 270 capsule 1   enoxaparin  (LOVENOX ) 150 MG/ML injection Inject 1 mL (150 mg total) into the skin daily. 14  mL 0   esomeprazole (NEXIUM) 40 MG capsule Take 40 mg by mouth daily  before breakfast.     furosemide (LASIX) 40 MG tablet Take 40 mg by mouth in the morning.     GLUCOSAMINE-CHONDROITIN PO Take 2 tablets by mouth every morning.     inFLIXimab-abda (RENFLEXIS IV) Inject into the vein.     inFLIXimab-dyyb 3 mg/kg in sodium chloride  0.9 % Inject 3 mg/kg into the vein every 8 (eight) weeks.     Krill Oil 300 MG CAPS Take 300 mg by mouth in the morning.     levobunolol  (BETAGAN ) 0.5 % ophthalmic solution Place 1 drop into both eyes every morning.      methocarbamol  (ROBAXIN ) 500 MG tablet Take 1 tablet (500 mg total) by mouth every 6 (six) hours as needed for muscle spasms. 40 tablet 0   Multiple Vitamin (MULTIVITAMIN WITH MINERALS) TABS tablet Take 1 tablet by mouth in the morning.     NIFEdipine  (PROCARDIA  XL/NIFEDICAL XL) 60 MG 24 hr tablet Take 60 mg by mouth every morning.     ondansetron  (ZOFRAN ) 4 MG tablet Take 1 tablet (4 mg total) by mouth every 6 (six) hours as needed for nausea. 20 tablet 0   oxyCODONE  (OXY IR/ROXICODONE ) 5 MG immediate release tablet Take 1-2 tablets (5-10 mg total) by mouth every 6 (six) hours as needed for severe pain (pain score 7-10). 42 tablet 0   oxyCODONE -acetaminophen  (PERCOCET/ROXICET) 5-325 MG tablet Take 1 tablet by mouth 3 (three) times daily as needed for severe pain (pain score 7-10) (pain.).     PRESCRIPTION MEDICATION Inhale 1 application into the lungs at bedtime. BiPAP setting 8/12     QUEtiapine  (SEROQUEL ) 300 MG tablet Take 2 tablets (600 mg total) by mouth at bedtime. 180 tablet 1   tirzepatide  (MOUNJARO ) 15 MG/0.5ML Pen Inject 15 mg into the skin once a week. 6 mL 1   Turmeric POWD Take 1,500 mg by mouth in the morning.     verapamil  (CALAN -SR) 180 MG CR tablet Take 1 tablet (180 mg total) by mouth daily. 30 tablet 1   No current facility-administered medications for this visit.     Musculoskeletal: Strength & Muscle Tone: UTA Gait & Station:  Seated Patient leans: N/A  Psychiatric Specialty Exam: Review of Systems  Psychiatric/Behavioral: Negative.      There were no vitals taken for this visit.There is no height or weight on file to calculate BMI.  General Appearance: Fairly Groomed  Eye Contact:  Good  Speech:  Clear and Coherent  Volume:  Normal  Mood:  Euthymic  Affect:  Congruent  Thought Process:  Goal Directed and Descriptions of Associations: Intact  Orientation:  Full (Time, Place, and Person)  Thought Content: Logical   Suicidal Thoughts:  No  Homicidal Thoughts:  No  Memory:  Immediate;   Fair Recent;   Fair Remote;   Fair  Judgement:  Fair  Insight:  Fair  Psychomotor Activity:  Normal  Concentration:  Concentration: Fair and Attention Span: Fair  Recall:  [employer] of Knowledge: Fair  Language: Fair  Akathisia:  No  Handed:  Right  AIMS (if indicated): denies tremors , spasms,TD  Assets:  Communication Skills Desire for Improvement Housing Social Support Transportation  ADL's:  Intact  Cognition: WNL  Sleep:  Fair   Screenings: Geneticist, molecular Office Visit from 03/14/2023 in [facility]  AIMS Total Score 0      GAD-7    Flowsheet Row Office Visit from 03/14/2023 in [facility]  Total GAD-7 Score 0      PHQ2-9    Flowsheet Row Office Visit from 03/14/2023 in [facility] Video Visit from 09/09/2022 in [facility] Video Visit from 05/13/2022 in [facility] [facility] Video Visit from [facility]21 [facility]  PHQ-2 Total Score 0 0 0 0 0  PHQ-9 Total Score 0 -- -- -- --      Flowsheet Row Video Visit from 09/16/2023 in [facility]  Admission (Discharged) from 07/04/2023 in [facility] Office Visit from 03/14/2023 in [facility]  C-SSRS RISK CATEGORY No Risk No Risk No Risk        Assessment and Plan: Jeanne [name] is a 63 year old African-American female on disability, has a history of schizoaffective disorder, OCD, diabetes mellitus, hypertension, hyperlipidemia, chronic pain, rheumatoid arthritis was evaluated by telemedicine today.  Discussed assessment and plan as noted below.  Schizoaffective disorder most recent episode depressed currently stable Currently denies any significant mood lability or psychosis.  Been maintained on medications including two atypical antipsychotics which include Seroquel  and Abilify .  Aware of long-term side effects of being on these medications together.  Not interested in tapering to monotherapy at this time since this regimen is keeping her symptoms stable. Continue Seroquel  600 mg at bedtime Continue Abilify  30 mg daily Patient declines tapering antipsychotics tomorrow therapy due to risk of being on 2 antipsychotics together. Patient provided education.  Voiced understanding. Continue Wellbutrin  XL 450 mg daily  OCD with good insight-stable Currently denies any obsessions or compulsive behaviors affecting day-to-day functioning Continue Cymbalta  90 mg daily Continue Seroquel  as prescribed  Tobacco use disorder-improving Currently cutting back and continues to be motivated to work on it. We will reassess in future sessions.  EKG reviewed dated 05/18/2023-normal sinus rhythm, QTc-444.  Follow-up Follow-up in clinic in 4 to 5 months or sooner in person.    Consent: Patient/Guardian gives verbal consent for treatment and assignment of benefits for services provided during this visit. Patient/Guardian expressed understanding and agreed to proceed.   This note was generated in part or whole with voice  recognition software. Voice recognition is usually quite accurate but there are transcription errors that can and very often do occur. I apologize for any typographical errors that were not detected and corrected.    Mykaila Blunck, MD 09/16/2023, 10:32 AM

## 2023-09-21 ENCOUNTER — Ambulatory Visit: Admitting: Psychology

## 2023-09-29 ENCOUNTER — Ambulatory Visit: Admitting: Psychology

## 2023-09-29 DIAGNOSIS — F331 Major depressive disorder, recurrent, moderate: Secondary | ICD-10-CM

## 2023-09-29 NOTE — Progress Notes (Signed)
  Behavioral Health Counselor/Therapist Progress Note  Patient ID: Leveta Denise Angola, MRN: [id number],    Date: 09/29/2023  Time Spent: 4:00pm-4:45pm   45 minutes   Treatment Type: Individual Therapy  Reported Symptoms: stress  Mental Status Exam: Appearance:  Casual     Behavior: Appropriate  Motor: Normal  Speech/Language:  Normal Rate  Affect: Appropriate  Mood: normal  Thought process: normal  Thought content:   WNL  Sensory/Perceptual disturbances:   WNL  Orientation: oriented to person, place, time/date, and situation  Attention: Good  Concentration: Good  Memory: WNL  Fund of knowledge:  Good  Insight:   Good  Judgment:  Good  Impulse Control: Good   Risk Assessment: Danger to Self:  No Self-injurious Behavior: No Danger to Others: No Duty to Warn:no Physical Aggression / Violence:No  Access to Firearms a concern: No  Gang Involvement:No   Subjective: Pt present for face-to-face individual therapy via video.  Pt consents to telehealth video session and is aware of limitations and benefits of virtual sessions. Location of pt: home Location of therapist: home office.   Pt talked about her nephew [name] who lives with her.  Pt wrote a letter to [employer] explaining why she needs for him to move out.  Jerrilyn Moras has been living with pt for 6 years when it was intended to be a short term situation.  Pt has had to support him financially and he has never paid her back.   Jerrilyn Moras has treated pt disrespectfully.   Pt wrote that he needs to move out by June 30th.    Josh read the letter but did not respond to it.   Pt does not know if he is planning to move out.   Addressed how pt can communicate that she needs to hear Jerrilyn Moras commit to moving out June 30th.    Pt is planning to share the letter with Josh's mother but pt is afraid of the mother's anger.   Encouraged pt to write a letter to Josh's mother (who is pt's sister) and see if she will help get Josh to move out.  Pt is  conflict avoidant so it may be hard for her to hold to her boundaries of having him move out.   Addressed how pt can navigate setting and holding boundaries in that relationship.    Pt talked about feelings guilty.   Addressed how it is misplaced guilt.   Worked on the codependency issues.  Worked on self care strategies.   Provided supportive therapy.    Interventions: Cognitive Behavioral Therapy and Insight-Oriented  Diagnosis:  F33.1  Plan of Care: Recommend ongoing therapy.   Pt participated in setting treatment goals and agrees with treatment plan.   Plan to continue to meet monthly.    Treatment Plan  (Treatment Plan Target Date:  10/06/2023) Client Abilities/Strengths  Pt is bright, engaging, and motivated for therapy.   Client Treatment Preferences  Individual therapy.  Client Statement of Needs  Improve coping skills.  Symptoms  Depressed or irritable mood.  Problems Addressed  Unipolar Depression Goals 1. Alleviate depressive symptoms and return to previous level of effective functioning. 2. Appropriately grieve the loss in order to normalize mood and to return to previously adaptive level of functioning. Objective Learn and implement behavioral strategies to overcome depression. Target Date: 2023-10-06 Frequency: Monthly  Progress: 55 Modality: individual  Related Interventions Engage the client in behavioral activation, increasing his/her activity level and contact with sources of reward, while identifying processes  that inhibit activation.  Use behavioral techniques such as instruction, rehearsal, role-playing, role reversal, as needed, to facilitate activity in the client's daily life; reinforce success. Assist the client in developing skills that increase the likelihood of deriving pleasure from behavioral activation (e.g., assertiveness skills, developing an exercise plan, less internal/more external focus, increased social involvement); reinforce  success. Objective Identify important people in life, past and present, and describe the quality, good and poor, of those relationships. Target Date: 2023-10-06 Frequency: Monthly  Progress: 55 Modality: individual  Related Interventions Conduct Interpersonal Therapy beginning with the assessment of the client's interpersonal inventory of important past and present relationships; develop a case formulation linking depression to grief, interpersonal role disputes, role transitions, and/or interpersonal deficits). Objective Learn and implement problem-solving and decision-making skills. Target Date: 2023-10-06 Frequency: Monthly  Progress: 55 Modality: individual  Related Interventions Conduct Problem-Solving Therapy using techniques such as psychoeducation, modeling, and role-playing to teach client problem-solving skills (i.e., defining a problem specifically, generating possible solutions, evaluating the pros and cons of each solution, selecting and implementing a plan of action, evaluating the efficacy of the plan, accepting or revising the plan); role-play application of the problem-solving skill to a real life issue. Encourage in the client the development of a positive problem orientation in which problems and solving them are viewed as a natural part of life and not something to be feared, despaired, or avoided. 3. Develop healthy interpersonal relationships that lead to the alleviation and help prevent the relapse of depression. 4. Develop healthy thinking patterns and beliefs about self, others, and the world that lead to the alleviation and help prevent the relapse of depression. 5. Recognize, accept, and cope with feelings of depression. Diagnosis F33.1  Conditions For Discharge Achievement of treatment goals and objectives   Willey Harrier, LCSW

## 2023-10-19 ENCOUNTER — Ambulatory Visit: Admitting: Psychology

## 2023-10-19 DIAGNOSIS — F331 Major depressive disorder, recurrent, moderate: Secondary | ICD-10-CM

## 2023-10-19 NOTE — Progress Notes (Signed)
 [facility] Counselor Initial Adult Exam  Name: Jeanne [name] Date: 10/19/2023 MRN: [id number] DOB: [birth year] PCP: Larnell Hamilton, MD  Time spent: 2:00pm - 2:45pm   45 minutes  Guardian/Payee:  n/a    Paperwork requested: No   Reason for Visit /Presenting Problem: Pt present for face-to-face initial assessment update via video.  Pt consents to telehealth video session and is aware of limitations and benefits of virtual sessions. Location of pt: home Location of therapist: home office.  Pt talked about issues with her nephew [name] who has been living with her for several years.   She is working on getting him to move out.   Pt has enlisted the help of her sister who will help look for housing for [place].   Pt is giving a deadline of July 30th for [name] to move out.   It is hard for pt to hold boundaries.  Encouraged her to stay the course with her expectations.   Pt is very involved in her [employer] and may have the opportunity to be recorded into ministry.   Addressed that this is an honor and something she may want to pursue.  Pt continues to have the goal to maintain mental health stability.   Pt states she tends to get lost in her head at times.  Pt states she knows how bad she can get and wants to avoid that.  Pt has significant psychiatric history.   When pt starts to get depressed she tends to sleep and eat more.  Her affect becomes more blunt.  Pt has not had hallucinations in many years.  Pt states her OCD ramps up and she tends to ruminate more when she is not doing well.   Pt wants help in therapy to notice if any of these warning signs surface.   Reviewed pt's treatment plan for annual update.   Updated treatment plan and IA.   Pt participated in setting treatment goals.  Pt's goal is to maintain stability in her mental health.  Plan to continue to meet every month.    Mental Status Exam: Appearance:   Casual     Behavior:  Appropriate   Motor:  Normal  Speech/Language:   Normal Rate  Affect:  Appropriate  Mood:  normal  Thought process:  normal  Thought content:    WNL  Sensory/Perceptual disturbances:    WNL  Orientation:  oriented to person, place, time/date, and situation  Attention:  Good  Concentration:  Good  Memory:  WNL  Fund of knowledge:   Good  Insight:    Good  Judgment:   Good  Impulse Control:  Good    Reported Symptoms:  stress  Risk Assessment: Danger to Self:  No Self-injurious Behavior: No Danger to Others: No Duty to Warn:no Physical Aggression / Violence:No  Access to Firearms a concern: No  Gang Involvement:No  Patient / guardian was educated about steps to take if suicide or homicide risk level increases between visits: n/a While future psychiatric events cannot be accurately predicted, the patient does not currently require acute inpatient psychiatric care and does not currently meet [facility]  involuntary commitment criteria.  Substance Abuse History: Current substance abuse: No     Past Psychiatric History:   Previous psychological history is significant for depression Outpatient Providers:pt was in therapy in the past with Olam Hind. History of Psych Hospitalization: Yes  Psychological Testing: n/a   Pt has been diagnosed with Major depression, schizoaffactive disorder depressed type  and OCD.  Pt sees a psychiatrist at [facility]  She is prescribed Abilify , Welbutrin, Cymbalta , and Seroquil.  Pt feels like this is a good and stable medication regimin for her.    In 1999-2004 pt was psychiatrically hospitalized about 5 times for SI.   Pt has not been hospitalized since Nov 13, 2002.    Abuse History:  Victim of: No., n/a   Report needed: No. Victim of Neglect:No. Perpetrator of n/a  Witness / Exposure to Domestic Violence: No   Protective Services Involvement: No  Witness to [employer] Violence:  No   Family History:  Family History  Problem  Relation Age of Onset   Diabetes Mother    Hypertension Mother    Hyperlipidemia Mother    Stroke Mother    Depression Mother    Eating disorder Mother    Hypertension Father    Hypertension Sister    Hypertension Sister    Cancer Maternal Aunt        breast ca   Breast cancer Maternal Aunt    Seizures Maternal Uncle    Heart attack Maternal Grandfather 2060/11/12   Heart attack Maternal Grandmother 2052-11-12   Bipolar disorder Nephew    Alcohol abuse Neg Hx    Anxiety disorder Neg Hx    Drug abuse Neg Hx    Schizophrenia Neg Hx    Colon cancer Neg Hx    Esophageal cancer Neg Hx    Stomach cancer Neg Hx    Rectal cancer Neg Hx     Living situation: the patient lives alone  Pt grew up with both parents and 3 older sisters.   Pt's parents were share croppers until they were in their November 13, 2023.   Family history of mental health issues: none. No family substance abuse or childhood abuse.   Pt states that overall she had a good childhood.   Her father worked in a factory and her mother worked as a Social research officer, government.   She felt loved and cared for.   Pt's first depressive episode occurred when she was 63 years old.   Both of pt's parents are deceased.   Pt's mother died in 11-13-2015.    Sexual Orientation: Straight  Relationship Status: single  Name of spouse / other:n/a If a parent, number of children / ages:none  Support Systems: friends  Financial Stress:  No   Income/Employment/Disability: Employment  Financial planner: No   Educational History: Education: Risk manager: Protestant  Any cultural differences that may affect / interfere with treatment:  not applicable   Recreation/Hobbies: reading and writing.  Stressors: Other: mental health, family at times.    Strengths: Supportive Relationships, Church, Spirituality, Hopefulness, Journalist, newspaper, and Able to Communicate Effectively  Barriers:  n/a   Legal History: Pending legal issue / charges: The  patient has no significant history of legal issues. History of legal issue / charges: n/a  Medical History/Surgical History: reviewed Past Medical History:  Diagnosis Date   Antiphospholipid antibody syndrome (HCC)    Anxiety    Arthritis    Bilateral swelling of feet    Carpal tunnel syndrome 01/02/2014   Bilateral   Clotting disorder (HCC)    Collagen disease (HCC) 04/21/2021   Constipation    Depression    Diabetes mellitus type 2, insulin  dependent (HCC) 02/26/2019   Difficult intubation 07/09/2013   Glidescope used, see Anesthesia note   DVT (deep venous thrombosis) (HCC) 2014   LEFT LEG   Gastritis  GERD (gastroesophageal reflux disease)    Glaucoma    Heart murmur    History of blood clots    Hyperlipidemia    Hypertension    Hypokalemia 03/05/2013   Kidney disease    stage ll   Neuromuscular disorder (HCC)     NEUROPATHY IN  MY HANDS   Obsessive-compulsive disorder    Osteoarthritis    Rheumatoid arthritis (HCC) 2016   Schizo-affective psychosis (HCC)    Schizoaffective disorder (HCC)    Scleritis of both eyes    Sleep apnea    Bipap   Suppurative hidradenitis    Undifferentiated connective tissue disease (HCC)     Past Surgical History:  Procedure Laterality Date   BUNIONECTOMY     CERVICAL CONIZATION W/BX     COLONOSCOPY WITH PROPOFOL  N/A 03/13/2019   Procedure: COLONOSCOPY WITH PROPOFOL ;  Surgeon: Legrand Victory LITTIE DOUGLAS, MD;  Location: [facility];  Service: Gastroenterology;  Laterality: N/A;   LAPAROSCOPIC ADRENALECTOMY Left 07/09/2013   Procedure: LAPAROSCOPIC LEFT  ADRENALECTOMY;  Surgeon: Krystal CHRISTELLA Spinner, MD;  Location: [facility];  Service: General;  Laterality: Left;   POLYPECTOMY  03/13/2019   Procedure: POLYPECTOMY;  Surgeon: Legrand Victory LITTIE DOUGLAS, MD;  Location: [facility];  Service: Gastroenterology;;   sweat gland removal  1975   TOTAL HIP ARTHROPLASTY Left 07/04/2023   Procedure: ARTHROPLASTY, HIP, TOTAL, ANTERIOR APPROACH;  Surgeon: [name] Lerner, MD;  Location: [facility];  Service: Orthopedics;  Laterality: Left;    Medications: Current Outpatient Medications  Medication Sig Dispense Refill   amLODipine  (NORVASC ) 5 MG tablet Take 5 mg by mouth every morning.      ARIPiprazole  (ABILIFY ) 30 MG tablet Take 1 tablet (30 mg total) by mouth daily. 90 tablet 1   atorvastatin  (LIPITOR) 40 MG tablet Take 1 tablet (40 mg total) by mouth daily. (Patient taking differently: Take 40 mg by mouth at bedtime.) 90 tablet 3   buPROPion  (WELLBUTRIN  XL) 150 MG 24 hr tablet Take 3 tablets (450 mg total) by mouth daily. 270 tablet 1   Calcium  Carb-Cholecalciferol (CALCIUM  + D3 PO) Take 1 tablet by mouth in the morning.     Coenzyme Q10 (COQ-10 PO) Take 300 mg by mouth in the morning.     DULoxetine  (CYMBALTA ) 30 MG capsule Take 3 capsules (90 mg total) by mouth daily. 270 capsule 1   enoxaparin  (LOVENOX ) 150 MG/ML injection Inject 1 mL (150 mg total) into the skin daily. 14 mL 0   esomeprazole (NEXIUM) 40 MG capsule Take 40 mg by mouth daily before breakfast.     furosemide (LASIX) 40 MG tablet Take 40 mg by mouth in the morning.     GLUCOSAMINE-CHONDROITIN PO Take 2 tablets by mouth every morning.     inFLIXimab-abda (RENFLEXIS IV) Inject into the vein.     inFLIXimab-dyyb 3 mg/kg in sodium chloride  0.9 % Inject 3 mg/kg into the vein every 8 (eight) weeks.     Krill Oil 300 MG CAPS Take 300 mg by mouth in the morning.     levobunolol  (BETAGAN ) 0.5 % ophthalmic solution Place 1 drop into both eyes every morning.      methocarbamol  (ROBAXIN ) 500 MG tablet Take 1 tablet (500 mg total) by mouth every 6 (six) hours as needed for muscle spasms. 40 tablet 0   Multiple Vitamin (MULTIVITAMIN WITH MINERALS) TABS tablet Take 1 tablet by mouth in the morning.     NIFEdipine  (PROCARDIA  XL/NIFEDICAL XL) 60 MG 24 hr tablet Take 60  mg by mouth every morning.     ondansetron  (ZOFRAN ) 4 MG tablet Take 1 tablet (4 mg total) by mouth every 6 (six) hours as needed for  nausea. 20 tablet 0   oxyCODONE  (OXY IR/ROXICODONE ) 5 MG immediate release tablet Take 1-2 tablets (5-10 mg total) by mouth every 6 (six) hours as needed for severe pain (pain score 7-10). 42 tablet 0   oxyCODONE -acetaminophen  (PERCOCET/ROXICET) 5-325 MG tablet Take 1 tablet by mouth 3 (three) times daily as needed for severe pain (pain score 7-10) (pain.).     PRESCRIPTION MEDICATION Inhale 1 application into the lungs at bedtime. BiPAP setting 8/12     QUEtiapine  (SEROQUEL ) 300 MG tablet Take 2 tablets (600 mg total) by mouth at bedtime. 180 tablet 1   tirzepatide  (MOUNJARO ) 15 MG/0.5ML Pen Inject 15 mg into the skin once a week. 6 mL 1   Turmeric POWD Take 1,500 mg by mouth in the morning.     verapamil  (CALAN -SR) 180 MG CR tablet Take 1 tablet (180 mg total) by mouth daily. 30 tablet 1   No current facility-administered medications for this visit.    Allergies  Allergen Reactions   Ace Inhibitors Anaphylaxis   Arava [Leflunomide] Other (See Comments)    Nephritis and kidney clearance issues   Travatan [Travoprost] Other (See Comments)    Red eyes, irritation    Methotrexate  Other (See Comments)    Renal issues    Diagnoses:  F33.1  Plan of Care: Recommend ongoing therapy.   Pt participated in setting treatment goals.  Plan to continue to meet monthly.  Pt agrees with treatment plan.   Treatment Plan  (Treatment Plan Target Date:  10/18/2024 ) Client Abilities/Strengths  Pt is bright, engaging, and motivated for therapy.   Client Treatment Preferences  Individual therapy.  Client Statement of Needs  Improve coping skills.  Symptoms  Depressed or irritable mood.  Problems Addressed  Unipolar Depression Goals 1. Alleviate depressive symptoms and return to previous level of effective functioning. 2. Appropriately grieve the loss in order to normalize mood and to return to previously adaptive level of functioning. Objective Learn and implement behavioral strategies to  overcome depression. Target Date: 2024-10-18 Frequency: Monthly  Progress: 60 Modality: individual  Related Interventions Engage the client in behavioral activation, increasing his/her activity level and contact with sources of reward, while identifying processes that inhibit activation.  Use behavioral techniques such as instruction, rehearsal, role-playing, role reversal, as needed, to facilitate activity in the client's daily life; reinforce success. Assist the client in developing skills that increase the likelihood of deriving pleasure from behavioral activation (e.g., assertiveness skills, developing an exercise plan, less internal/more external focus, increased social involvement); reinforce success. Objective Identify important people in life, past and present, and describe the quality, good and poor, of those relationships. Target Date: 2024-10-18 Frequency: Monthly  Progress: 60 Modality: individual  Related Interventions Conduct Interpersonal Therapy beginning with the assessment of the client's interpersonal inventory of important past and present relationships; develop a case formulation linking depression to grief, interpersonal role disputes, role transitions, and/or interpersonal deficits). Objective Learn and implement problem-solving and decision-making skills. Target Date: 2024-10-18 Frequency: Monthly  Progress: 60 Modality: individual  Related Interventions Conduct Problem-Solving Therapy using techniques such as psychoeducation, modeling, and role-playing to teach client problem-solving skills (i.e., defining a problem specifically, generating possible solutions, evaluating the pros and cons of each solution, selecting and implementing a plan of action, evaluating the efficacy of the plan, accepting or revising the  plan); role-play application of the problem-solving skill to a real life issue. Encourage in the client the development of a positive problem orientation in  which problems and solving them are viewed as a natural part of life and not something to be feared, despaired, or avoided. 3. Develop healthy interpersonal relationships that lead to the alleviation and help prevent the relapse of depression. 4. Develop healthy thinking patterns and beliefs about self, others, and the world that lead to the alleviation and help prevent the relapse of depression. 5. Recognize, accept, and cope with feelings of depression. Diagnosis F33.1  Conditions For Discharge Achievement of treatment goals and objectives    [name], LCSW

## 2023-10-27 ENCOUNTER — Other Ambulatory Visit: Payer: Self-pay | Admitting: Internal Medicine

## 2023-10-27 DIAGNOSIS — Z1231 Encounter for screening mammogram for malignant neoplasm of breast: Secondary | ICD-10-CM

## 2023-11-09 ENCOUNTER — Telehealth: Payer: Self-pay

## 2023-11-09 NOTE — Telephone Encounter (Signed)
   Name: Jeanne [name]  DOB: [birth year]  MRN: [id number]  Primary Cardiologist: Last seen by APP Althea) needs to be established.    Preoperative team, please contact this patient and set up a phone call appointment for further preoperative risk assessment. Please obtain consent and complete medication review. Thank you for your help. Last seen by Rosaline Bane 05/18/2023  I confirm that guidance regarding antiplatelet and oral anticoagulation therapy has been completed and, if necessary, noted below.  Patient is not on anticoagulation or antiplatelet per review of medical record in Epic.    I also confirmed the patient resides in the state of [place] . As per [facility] Medical Board telemedicine laws, the patient must reside in the state in which the provider is licensed.   Lamarr Satterfield, NP 11/09/2023, 1:35 PM [facility]

## 2023-11-09 NOTE — Telephone Encounter (Signed)
   Pre-operative Risk Assessment    Patient Name: Jeanne [name]  DOB: 11-18-1960 MRN: [id number]   Date of last office visit: 05/18/23 Date of next office visit: n/a   Request for Surgical Clearance    Procedure:  Left total knee arthroplasty   Date of Surgery:  Clearance 01/30/24                                 Surgeon:  Dr. Dempsey Moan  Surgeon's Group or Practice Name:  [facility]  Phone number:  [phone] Fax number:  [phone]   Type of Clearance Requested:   - Medical    Type of Anesthesia:  Choice    Additional requests/questions:    SignedRebeca Blight   11/09/2023, 11:46 AM

## 2023-11-09 NOTE — Telephone Encounter (Signed)
 Patient has been scheduled for televisit med rec and consent done     Patient Consent for Virtual Visit         Jeanne [name] has provided verbal consent on 11/09/2023 for a virtual visit (video or telephone).   CONSENT FOR VIRTUAL VISIT FOR:  Jeanne [name]  By participating in this virtual visit I agree to the following:  I hereby voluntarily request, consent and authorize [facility] and its employed or contracted physicians, physician assistants, nurse practitioners or other licensed health care professionals (the Practitioner), to provide me with telemedicine health care services (the "Services) as deemed necessary by the treating Practitioner. I acknowledge and consent to receive the Services by the Practitioner via telemedicine. I understand that the telemedicine visit will involve communicating with the Practitioner through live audiovisual communication technology and the disclosure of certain medical information by electronic transmission. I acknowledge that I have been given the opportunity to request an in-person assessment or other available alternative prior to the telemedicine visit and am voluntarily participating in the telemedicine visit.  I understand that I have the right to withhold or withdraw my consent to the use of telemedicine in the course of my care at any time, without affecting my right to future care or treatment, and that the Practitioner or I may terminate the telemedicine visit at any time. I understand that I have the right to inspect all information obtained and/or recorded in the course of the telemedicine visit and may receive copies of available information for a reasonable fee.  I understand that some of the potential risks of receiving the Services via telemedicine include:  Delay or interruption in medical evaluation due to technological equipment failure or disruption; Information transmitted may not be sufficient (e.g. poor  resolution of images) to allow for appropriate medical decision making by the Practitioner; and/or  In rare instances, security protocols could fail, causing a breach of personal health information.  Furthermore, I acknowledge that it is my responsibility to provide information about my medical history, conditions and care that is complete and accurate to the best of my ability. I acknowledge that Practitioner's advice, recommendations, and/or decision may be based on factors not within their control, such as incomplete or inaccurate data provided by me or distortions of diagnostic images or specimens that may result from electronic transmissions. I understand that the practice of medicine is not an exact science and that Practitioner makes no warranties or guarantees regarding treatment outcomes. I acknowledge that a copy of this consent can be made available to me via my patient portal [facility] MyChart), or I can request a printed copy by calling the office of [facility].    I understand that my insurance will be billed for this visit.   I have read or had this consent read to me. I understand the contents of this consent, which adequately explains the benefits and risks of the Services being provided via telemedicine.  I have been provided ample opportunity to ask questions regarding this consent and the Services and have had my questions answered to my satisfaction. I give my informed consent for the services to be provided through the use of telemedicine in my medical care

## 2023-11-09 NOTE — Telephone Encounter (Signed)
Patient has been scheduled for televisit.

## 2023-11-16 ENCOUNTER — Ambulatory Visit (INDEPENDENT_AMBULATORY_CARE_PROVIDER_SITE_OTHER): Admitting: Psychology

## 2023-11-16 DIAGNOSIS — F331 Major depressive disorder, recurrent, moderate: Secondary | ICD-10-CM

## 2023-11-16 NOTE — Progress Notes (Signed)
 [facility] Counselor/Therapist Progress Note  Patient ID: Jeanne [name], MRN: [id number],    Date: 11/16/2023  Time Spent: 2:00pm-2:45pm   45 minutes   Treatment Type: Individual Therapy  Reported Symptoms: stress  Mental Status Exam: Appearance:  Casual     Behavior: Appropriate  Motor: Normal  Speech/Language:  Normal Rate  Affect: Appropriate  Mood: normal  Thought process: normal  Thought content:   WNL  Sensory/Perceptual disturbances:   WNL  Orientation: oriented to person, place, time/date, and situation  Attention: Good  Concentration: Good  Memory: WNL  Fund of knowledge:  Good  Insight:   Good  Judgment:  Good  Impulse Control: Good   Risk Assessment: Danger to Self:  No Self-injurious Behavior: No Danger to Others: No Duty to Warn:no Physical Aggression / Violence:No  Access to Firearms a concern: No  Gang Involvement:No   Subjective: Pt present for face-to-face individual therapy in person.  Pt talked about attending a family reunion and she is having trouble getting back to healthy eating.   Pt states she has had the month of July off and has been delinquent with her church responsibilities.  There have been some church issues with the pastor lying.  Addressed how this impacts pt.  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  Pt is trying to figure out what her gift is.  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  Addressed how pt can use her gifts in the church.   Pt talked about her financial stress.  She wants to attend some educational programs but can't afford the fee so she is thinking about applying for a scholarship.   Pt feels badly about her financial limitations.   Helped pt process her thoughts and feelings.   Addressed the narratives she creates about what she thinks people will think.   Provided supportive therapy.  Interventions: Cognitive Behavioral  Therapy and Insight-Oriented  Diagnosis: F33.1  Plan of Care: Recommend ongoing therapy.   Pt participated in setting treatment goals.  Plan to continue to meet monthly.  Pt agrees with treatment plan.   Treatment Plan  (Treatment Plan Target Date:  10/18/2024 ) Client Abilities/Strengths  Pt is bright, engaging, and motivated for therapy.   Client Treatment Preferences  Individual therapy.  Client Statement of Needs  Improve coping skills.  Symptoms  Depressed or irritable mood.  Problems Addressed  Unipolar Depression Goals 1. Alleviate depressive symptoms and return to previous level of effective functioning. 2. Appropriately grieve the loss in order to normalize mood and to return to previously adaptive level of functioning. Objective Learn and implement behavioral strategies to overcome depression. Target Date: 2024-10-18 Frequency: Monthly  Progress: 60 Modality: individual  Related Interventions Engage the client in behavioral activation, increasing his/her activity level and contact with sources of reward, while identifying processes that inhibit activation.  Use behavioral techniques such as instruction, rehearsal, role-playing, role reversal, as needed, to facilitate activity in the client's daily life; reinforce success. Assist the client in developing skills that increase the likelihood of deriving pleasure from behavioral activation (e.g., assertiveness skills, developing an exercise plan, less internal/more external focus, increased social involvement); reinforce success. Objective Identify important people in life, past and present, and describe the quality, good and poor, of those relationships. Target Date: 2024-10-18 Frequency: Monthly  Progress: 60 Modality: individual  Related Interventions Conduct Interpersonal Therapy beginning with the assessment of  the client's interpersonal inventory of important past and present relationships; develop a case formulation  linking depression to grief, interpersonal role disputes, role transitions, and/or interpersonal deficits). Objective Learn and implement problem-solving and decision-making skills. Target Date: 2024-10-18 Frequency: Monthly  Progress: 60 Modality: individual  Related Interventions Conduct Problem-Solving Therapy using techniques such as psychoeducation, modeling, and role-playing to teach client problem-solving skills (i.e., defining a problem specifically, generating possible solutions, evaluating the pros and cons of each solution, selecting and implementing a plan of action, evaluating the efficacy of the plan, accepting or revising the plan); role-play application of the problem-solving skill to a real life issue. Encourage in the client the development of a positive problem orientation in which problems and solving them are viewed as a natural part of life and not something to be feared, despaired, or avoided. 3. Develop healthy interpersonal relationships that lead to the alleviation and help prevent the relapse of depression. 4. Develop healthy thinking patterns and beliefs about self, others, and the world that lead to the alleviation and help prevent the relapse of depression. 5. Recognize, accept, and cope with feelings of depression. Diagnosis F33.1  Conditions For Discharge Achievement of treatment goals and objectives   [name], LCSW

## 2023-11-22 ENCOUNTER — Ambulatory Visit
Admission: RE | Admit: 2023-11-22 | Discharge: 2023-11-22 | Disposition: A | Discharge status: Home / Self Care | Source: Ambulatory Visit | Attending: Internal Medicine | Admitting: Internal Medicine

## 2023-11-22 DIAGNOSIS — Z1231 Encounter for screening mammogram for malignant neoplasm of breast: Secondary | ICD-10-CM

## 2023-11-24 LAB — LAB REPORT - SCANNED
A1c: 5.4
TSH: 1.35 (ref 0.41–5.90)

## 2023-11-25 LAB — LAB REPORT - SCANNED: EGFR: 54.9

## 2023-12-02 LAB — LAB REPORT - SCANNED
Albumin, Urine POC: 16.6
Creatinine, POC: 41.9 mg/dL

## 2023-12-14 ENCOUNTER — Ambulatory Visit (INDEPENDENT_AMBULATORY_CARE_PROVIDER_SITE_OTHER): Admitting: Psychology

## 2023-12-14 DIAGNOSIS — F331 Major depressive disorder, recurrent, moderate: Secondary | ICD-10-CM

## 2023-12-14 NOTE — Progress Notes (Signed)
 [facility] Counselor/Therapist Progress Note  Patient ID: Jeanne [name], MRN: [id number],    Date: 12/14/2023  Time Spent: 2:00pm-2:45pm   45 minutes   Treatment Type: Individual Therapy  Reported Symptoms: stress  Mental Status Exam: Appearance:  Casual     Behavior: Appropriate  Motor: Normal  Speech/Language:  Normal Rate  Affect: Appropriate  Mood: normal  Thought process: normal  Thought content:   WNL  Sensory/Perceptual disturbances:   WNL  Orientation: oriented to person, place, time/date, and situation  Attention: Good  Concentration: Good  Memory: WNL  Fund of knowledge:  Good  Insight:   Good  Judgment:  Good  Impulse Control: Good   Risk Assessment: Danger to Self:  No Self-injurious Behavior: No Danger to Others: No Duty to Warn:no Physical Aggression / Violence:No  Access to Firearms a concern: No  Gang Involvement:No   Subjective: Pt present for face-to-face individual therapy in person.  Pt talked about her [employer] meeting responsibilities.  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  She enjoys working on [employer] but is concerned about what others will think about her doing all the work.  She is making up negative narratives in her mind about what other people could be thinking.   Helped pt look at other possible narratives such as people feeling grateful that pt is doing so much work for the meeting.    Pt has been thinking about her life purpose and realizes that so much of her time and energy goes into the [employer].   She has doubted whether that is ok.   Helped pt explore what that means for her and to ground herself in her own sense of purpose and core values and quiet the worries of what others may think.    Provided supportive therapy.  Interventions: Cognitive Behavioral Therapy and Insight-Oriented  Diagnosis: F33.1  Plan of Care: Recommend ongoing therapy.   Pt  participated in setting treatment goals.  Plan to continue to meet monthly.  Pt agrees with treatment plan.   Treatment Plan  (Treatment Plan Target Date:  10/18/2024 ) Client Abilities/Strengths  Pt is bright, engaging, and motivated for therapy.   Client Treatment Preferences  Individual therapy.  Client Statement of Needs  Improve coping skills.  Symptoms  Depressed or irritable mood.  Problems Addressed  Unipolar Depression Goals 1. Alleviate depressive symptoms and return to previous level of effective functioning. 2. Appropriately grieve the loss in order to normalize mood and to return to previously adaptive level of functioning. Objective Learn and implement behavioral strategies to overcome depression. Target Date: 2024-10-18 Frequency: Monthly  Progress: 60 Modality: individual  Related Interventions Engage the client in behavioral activation, increasing his/her activity level and contact with sources of reward, while identifying processes that inhibit activation.  Use behavioral techniques such as instruction, rehearsal, role-playing, role reversal, as needed, to facilitate activity in the client's daily life; reinforce success. Assist the client in developing skills that increase the likelihood of deriving pleasure from behavioral activation (e.g., assertiveness skills, developing an exercise plan, less internal/more external focus, increased social involvement); reinforce success. Objective Identify important people in life, past and present, and describe the quality, good and poor, of those relationships. Target Date: 2024-10-18 Frequency: Monthly  Progress: 60 Modality: individual  Related Interventions Conduct Interpersonal Therapy beginning with the assessment of the client's interpersonal inventory of important past and present relationships; develop a case formulation linking depression to grief, interpersonal  role disputes, role transitions, and/or interpersonal  deficits). Objective Learn and implement problem-solving and decision-making skills. Target Date: 2024-10-18 Frequency: Monthly  Progress: 60 Modality: individual  Related Interventions Conduct Problem-Solving Therapy using techniques such as psychoeducation, modeling, and role-playing to teach client problem-solving skills (i.e., defining a problem specifically, generating possible solutions, evaluating the pros and cons of each solution, selecting and implementing a plan of action, evaluating the efficacy of the plan, accepting or revising the plan); role-play application of the problem-solving skill to a real life issue. Encourage in the client the development of a positive problem orientation in which problems and solving them are viewed as a natural part of life and not something to be feared, despaired, or avoided. 3. Develop healthy interpersonal relationships that lead to the alleviation and help prevent the relapse of depression. 4. Develop healthy thinking patterns and beliefs about self, others, and the world that lead to the alleviation and help prevent the relapse of depression. 5. Recognize, accept, and cope with feelings of depression. Diagnosis F33.1  Conditions For Discharge Achievement of treatment goals and objectives   [name], LCSW

## 2023-12-22 ENCOUNTER — Other Ambulatory Visit (HOSPITAL_COMMUNITY): Payer: Self-pay

## 2023-12-22 MED ORDER — MOUNJARO 15 MG/0.5ML ~~LOC~~ SOAJ
15.0000 mg | SUBCUTANEOUS | 1 refills | Status: AC
  Filled 2023-12-22: qty 6, 84d supply, fill #0
  Filled 2024-03-26: qty 6, 84d supply, fill #1

## 2024-01-02 NOTE — H&P (Addendum)
 TOTAL KNEE ADMISSION H&P  Patient is being admitted for left total knee arthroplasty.  Subjective:  Chief Complaint: Left knee pain.  HPI: Jeanne [name], 63 y.o. female has a history of pain and functional disability in the left knee due to arthritis and has failed non-surgical conservative treatments for greater than 12 weeks to include corticosteriod injections, weight reduction as appropriate, and activity modification. Onset of symptoms was gradual, starting several years ago with gradually worsening course since that time. The patient noted no past surgery on the left knee.  Patient currently rates pain in the left knee at 8 out of 10 with activity. Patient has night pain, worsening of pain with activity and weight bearing, pain with passive range of motion, and crepitus. Patient has evidence of bone-on-bone changes in the medial and patellofemoral compartments by imaging studies. There is no active infection.  Patient Active Problem List   Diagnosis Date Noted   OA (osteoarthritis) of hip 07/04/2023   Osteoarthritis of left hip 07/04/2023   At risk for prolonged QT interval syndrome 03/14/2023   Tobacco use disorder 06/18/2021   History of DVT (deep vein thrombosis), 2014, left leg 04/21/2021   Rheumatoid arthritis (HCC) 04/21/2021   Scleritis with corneal involvement 04/21/2021   Collagen disease (HCC) 04/21/2021   Inflammatory polyarthropathy (HCC) 04/21/2021   High risk medication use 04/21/2021   Osteoarthritis of knee 04/21/2021   Chronic pain 04/21/2021   Anosmia 04/21/2021   Diabetic mononeuropathy associated with type 2 diabetes mellitus (HCC) 04/21/2021   Visceral obesity 04/21/2021   Gastroesophageal reflux disease without esophagitis 04/21/2021   Type 2 diabetes mellitus with stage 3a chronic kidney disease, without long-term current use of insulin  (HCC) 01/29/2020   Hyperlipidemia associated with type 2 diabetes mellitus (HCC) 06/04/2019   Stage 3a chronic  kidney disease (HCC) 04/09/2019   Vitamin D  deficiency 04/09/2019   Chronic constipation, with Hx of ileus 02/26/2019   Hypertension associated with diabetes (HCC) 02/26/2019   Class 3 severe obesity with serious comorbidity and body mass index (BMI) of 40.0 to 44.9 in adult 02/26/2019   Cigarette nicotine dependence without complication 04/10/2015   OCD (obsessive compulsive disorder) 04/10/2015   Schizoaffective disorder, depressive type (HCC) 04/10/2015   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 07/10/2013 07/03/2013   Obstructive sleep apnea 03/05/2013   Glaucoma 03/05/2013   Connective tissue disorder (HCC) 03/05/2013    Past Medical History:  Diagnosis Date   Antiphospholipid antibody syndrome (HCC)    Anxiety    Arthritis    Bilateral swelling of feet    Carpal tunnel syndrome 01/02/2014   Bilateral   Clotting disorder (HCC)    Collagen disease (HCC) 04/21/2021   Constipation    Depression    Diabetes mellitus type 2, insulin  dependent (HCC) 02/26/2019   Difficult intubation 07/09/2013   Glidescope used, see Anesthesia note   DVT (deep venous thrombosis) (HCC) 2014   LEFT LEG   Gastritis    GERD (gastroesophageal reflux disease)    Glaucoma    Heart murmur    History of blood clots    Hyperlipidemia    Hypertension    Hypokalemia 03/05/2013   Kidney disease    stage ll   Neuromuscular disorder (HCC)     NEUROPATHY IN  MY HANDS   Obsessive-compulsive disorder    Osteoarthritis    Rheumatoid arthritis (HCC) 2016   Schizo-affective psychosis (HCC)    Schizoaffective disorder (HCC)    Scleritis of both  eyes    Sleep apnea    Bipap   Suppurative hidradenitis    Undifferentiated connective tissue disease (HCC)     Past Surgical History:  Procedure Laterality Date   BUNIONECTOMY     CERVICAL CONIZATION W/BX     COLONOSCOPY WITH PROPOFOL  N/A 03/13/2019   Procedure: COLONOSCOPY WITH PROPOFOL ;  Surgeon: Legrand Victory LITTIE DOUGLAS, MD;   Location: [facility];  Service: Gastroenterology;  Laterality: N/A;   LAPAROSCOPIC ADRENALECTOMY Left 07/09/2013   Procedure: LAPAROSCOPIC LEFT  ADRENALECTOMY;  Surgeon: Krystal CHRISTELLA Spinner, MD;  Location: [facility];  Service: General;  Laterality: Left;   POLYPECTOMY  03/13/2019   Procedure: POLYPECTOMY;  Surgeon: Legrand Victory LITTIE DOUGLAS, MD;  Location: [facility];  Service: Gastroenterology;;   sweat gland removal  1975   TOTAL HIP ARTHROPLASTY Left 07/04/2023   Procedure: ARTHROPLASTY, HIP, TOTAL, ANTERIOR APPROACH;  Surgeon: [name] Lerner, MD;  Location: [facility];  Service: Orthopedics;  Laterality: Left;    Prior to Admission medications   Medication Sig Start Date End Date Taking? Authorizing Provider  amLODipine  (NORVASC ) 5 MG tablet Take 5 mg by mouth every morning.     [provider]  ARIPiprazole  (ABILIFY ) 30 MG tablet Take 1 tablet (30 mg total) by mouth daily. 09/16/23   Eappen, Saramma, MD  atorvastatin  (LIPITOR) 40 MG tablet Take 1 tablet (40 mg total) by mouth daily. Patient taking differently: Take 40 mg by mouth at bedtime. 05/18/23   Swinyer, Rosaline CHRISTELLA, NP  buPROPion  (WELLBUTRIN  XL) 150 MG 24 hr tablet Take 3 tablets (450 mg total) by mouth daily. 09/16/23   Eappen, Saramma, MD  Calcium  Carb-Cholecalciferol (CALCIUM  + D3 PO) Take 1 tablet by mouth in the morning.    [provider]  Coenzyme Q10 (COQ-10 PO) Take 300 mg by mouth in the morning.    [provider]  DULoxetine  (CYMBALTA ) 30 MG capsule Take 3 capsules (90 mg total) by mouth daily. 09/16/23   Eappen, Saramma, MD  enoxaparin  (LOVENOX ) 150 MG/ML injection Inject 1 mL (150 mg total) into the skin daily. 05/31/23     esomeprazole (NEXIUM) 40 MG capsule Take 40 mg by mouth daily before breakfast.    [provider]  furosemide (LASIX) 40 MG tablet Take 40 mg by mouth in the morning.    [provider]  GLUCOSAMINE-CHONDROITIN PO Take 2 tablets by mouth every morning.    [provider]  inFLIXimab-abda (RENFLEXIS IV) Inject into the vein.    [provider]  inFLIXimab-dyyb 3 mg/kg in sodium chloride  0.9 % Inject 3 mg/kg into the vein every 8 (eight) weeks.    [provider]  Anselm Oil 300 MG CAPS Take 300 mg by mouth in the morning.    [provider]  levobunolol  (BETAGAN ) 0.5 % ophthalmic solution Place 1 drop into both eyes every morning.     [provider]  methocarbamol  (ROBAXIN ) 500 MG tablet Take 1 tablet (500 mg total) by mouth every 6 (six) hours as needed for muscle spasms. 07/05/23   Lauris Keepers L, PA  Multiple Vitamin (MULTIVITAMIN WITH MINERALS) TABS tablet Take 1 tablet by mouth in the morning.    [provider]  NIFEdipine  (PROCARDIA  XL/NIFEDICAL XL) 60 MG 24 hr tablet Take 60 mg by mouth every morning. 03/15/23   [provider]  ondansetron  (ZOFRAN ) 4 MG tablet Take 1 tablet (4 mg total) by mouth every 6 (six) hours as needed for nausea. 07/05/23   Gus Littler,  Afsana Liera L, PA  oxyCODONE  (OXY IR/ROXICODONE ) 5 MG immediate release tablet Take 1-2 tablets (5-10 mg total) by mouth every 6 (six) hours as needed for severe pain (pain score 7-10). 07/05/23   Cortlandt Capuano, Roxie CROME, PA  oxyCODONE -acetaminophen  (PERCOCET/ROXICET) 5-325 MG tablet Take 1 tablet by mouth 3 (three) times daily as needed for severe pain (pain score 7-10) (pain.).    [provider]  PRESCRIPTION MEDICATION Inhale 1 application into the lungs at bedtime. BiPAP setting 8/12    [provider]  QUEtiapine  (SEROQUEL ) 300 MG tablet Take 2 tablets (600 mg total) by mouth at bedtime. 09/16/23   Eappen, Saramma, MD  tirzepatide  (MOUNJARO ) 15 MG/0.5ML Pen Inject 15 mg into the skin once a week. 09/01/23     tirzepatide  (MOUNJARO ) 15 MG/0.5ML Pen Inject 15 mg into the skin once a week. 12/22/23     Turmeric POWD Take 1,500 mg by mouth in the morning.    [provider]  verapamil  (CALAN -SR) 180 MG CR tablet Take 1  tablet (180 mg total) by mouth daily. 07/16/13   Eletha Boas, MD    Allergies  Allergen Reactions   Ace Inhibitors Anaphylaxis   Arava [Leflunomide] Other (See Comments)    Nephritis and kidney clearance issues   Travatan [Travoprost] Other (See Comments)    Red eyes, irritation    Methotrexate  Other (See Comments)    Renal issues    Social History   Socioeconomic History   Marital status: Single    Spouse name: Not on file   Number of children: 0   Years of education: college   Highest education level: Not on file  Occupational History   Occupation: Retired  Tobacco Use   Smoking status: Every Day    Current packs/day: 0.50    Average packs/day: 0.5 packs/day for 25.0 years (12.5 ttl pk-yrs)    Types: Cigarettes   Smokeless tobacco: Never   Tobacco comments:    Slowly working thru it on her own  Vaping Use   Vaping status: Never Used  Substance and Sexual Activity   Alcohol use: Yes    Comment: 2-3 times a month   Drug use: No   Sexual activity: Not Currently  Other Topics Concern   Not on file  Social History Narrative   Pt is living in [place] with mother. Born and raised in [place] by parents. Childhood was fine. Pt has 3 older sisters. Pt has a law degree. 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 Pt is currently on disability for mental health issues. Single, no kids.    Social Drivers of Corporate investment banker Strain: Not on file  Food Insecurity: No Food Insecurity (07/04/2023)   Hunger Vital Sign    Worried About Running Out of Food in the Last Year: Never true    Ran Out of Food in the Last Year: Never true  Transportation Needs: No Transportation Needs (07/04/2023)   PRAPARE - Administrator, Civil Service (Medical): No    Lack of Transportation (Non-Medical): No  Physical Activity: Not on file  Stress: Not on file  Social Connections: Not on file  Intimate Partner Violence: Not At Risk (07/04/2023)   Humiliation, Afraid, Rape,  and Kick questionnaire    Fear of Current or Ex-Partner: No    Emotionally Abused: No    Physically Abused: No    Sexually Abused: No    Tobacco Use: High Risk (09/16/2023)   Patient History  Smoking Tobacco Use: Every Day    Smokeless Tobacco Use: Never    Passive Exposure: Not on file   Social History   Substance and Sexual Activity  Alcohol Use Yes   Comment: 2-3 times a month    Family History  Problem Relation Age of Onset   Diabetes Mother    Hypertension Mother    Hyperlipidemia Mother    Stroke Mother    Depression Mother    Eating disorder Mother    Hypertension Father    Hypertension Sister    Hypertension Sister    Cancer Maternal Aunt        breast ca   Breast cancer Maternal Aunt    Seizures Maternal Uncle    Heart attack Maternal Grandfather 29   Heart attack Maternal Grandmother 54   Bipolar disorder Nephew    Alcohol abuse Neg Hx    Anxiety disorder Neg Hx    Drug abuse Neg Hx    Schizophrenia Neg Hx    Colon cancer Neg Hx    Esophageal cancer Neg Hx    Stomach cancer Neg Hx    Rectal cancer Neg Hx     Review of Systems  Constitutional:  Negative for chills and fever.  HENT:  Negative for congestion, sore throat and tinnitus.   Eyes:  Negative for double vision, photophobia and pain.  Respiratory:  Negative for cough, shortness of breath and wheezing.   Cardiovascular:  Negative for chest pain, palpitations and orthopnea.  Gastrointestinal:  Negative for heartburn, nausea and vomiting.  Genitourinary:  Negative for dysuria, frequency and urgency.  Musculoskeletal:  Positive for joint pain.  Neurological:  Negative for dizziness, weakness and headaches.    Objective:  Physical Exam: Well nourished and well developed.  General: Alert and oriented x3, cooperative and pleasant, no acute distress.  Head: normocephalic, atraumatic, neck supple.  Eyes: EOMI.  Musculoskeletal:  Left Knee: No effusion. Range of motion 5-125 with crepitus.  Slight tenderness medially and laterally. No instability.   Calves soft and nontender. Motor function intact in LE. Strength 5/5 LE bilaterally. Neuro: Distal pulses 2+. Sensation to light touch intact in LE.  Imaging Review Plain radiographs demonstrate severe degenerative joint disease of the left knee. The overall alignment is neutral. The bone quality appears to be adequate for age and reported activity level.  Assessment/Plan:  End stage arthritis, left knee   The patient history, physical examination, clinical judgment of the provider and imaging studies are consistent with end stage degenerative joint disease of the left knee and total knee arthroplasty is deemed medically necessary. The treatment options including medical management, injection therapy arthroscopy and arthroplasty were discussed at length. The risks and benefits of total knee arthroplasty were presented and reviewed. The risks due to aseptic loosening, infection, stiffness, patella tracking problems, thromboembolic complications and other imponderables were discussed. The patient acknowledged the explanation, agreed to proceed with the plan and consent was signed. Patient is being admitted for inpatient treatment for surgery, pain control, PT, OT, prophylactic antibiotics, VTE prophylaxis, progressive ambulation and ADLs and discharge planning. The patient is planning to be discharged home.   Patient's anticipated LOS is less than 2 midnights, meeting these requirements: - Younger than 29 - Lives within 1 hour of care - Has a competent adult at home to recover with post-op recover - NO history of  - Diabetes  - Coronary Artery Disease  - Heart failure  - Heart attack  - Stroke  - Cardiac  arrhythmia  - Respiratory Failure/COPD  - Renal failure  - Anemia  - Advanced Liver disease  Therapy Plans: Outpatient therapy at [facility] Disposition: Home with friends nearby Planned DVT Prophylaxis: Xarelto 10 mg QD  ** DME Needed: None PCP: Glendia Freeman, MD (clearance received) Cardiologist: Lonni Cash, MD (clearance received) TXA: Topical Allergies: ACEI, leflunomide, methotrexate , travoprost Metal Allergy: None Anesthesia Concerns: None BMI: 32.6 Last HgbA1c: 5.4% (11/24/2023) Pain Regimen: Oxycodone  for breakthrough [facility]  Other: - Was put on lovenox  by PCP with [facility] in April -Hx anti-phospholipid syndrome - Holding mounjaro  10 days prior - Percocet 5-325 up to TID, used oxy for breakthrough with [facility]. This worked well  - Patient was instructed on what medications to stop prior to surgery. - Follow-up visit in 2 weeks with Dr. [name] - Begin physical therapy following surgery - Pre-operative lab work as pre-surgical testing - Prescriptions will be provided in hospital at time of discharge  Roxie Mess, PA-C Orthopedic Surgery [facility] [place]

## 2024-01-09 ENCOUNTER — Ambulatory Visit: Attending: Cardiovascular Disease | Admitting: Nurse Practitioner

## 2024-01-09 DIAGNOSIS — Z01810 Encounter for preprocedural cardiovascular examination: Secondary | ICD-10-CM | POA: Diagnosis not present

## 2024-01-09 NOTE — Progress Notes (Signed)
 Virtual Visit via Telephone Note   Because of Jeanne [name] co-morbid illnesses, she is at least at moderate risk for complications without adequate follow up.  This format is felt to be most appropriate for this patient at this time.  Due to technical limitations with video connection Web designer), today's appointment will be conducted as an audio only telehealth visit, and Jeanne [name] verbally agreed to proceed in this manner.   All issues noted in this document were discussed and addressed.  No physical exam could be performed with this format.  Evaluation Performed:  Preoperative cardiovascular risk assessment _____________   Date:  01/09/2024   Patient ID:  Jeanne [name], DOB Aug 06, 1960, MRN [id number] Patient Location:  Home Provider location:   Office  Primary Care Provider:  Larnell Hamilton, MD Primary Cardiologist:  None  Chief Complaint / Patient Profile   63 y.o. y/o female with a h/o coronary artery calcification, aortic atherosclerosis, hypertension, hyperlipidemia, type 2 diabetes, CKD, OSA on BiPAP, antiphospholipid antibody syndrome, rheumatoid arthritis, COPD, and tobacco use who is pending left total knee arthroplasty on 01/30/2024 with Dr. Dempsey Moan of [facility] and presents today for telephonic preoperative cardiovascular risk assessment.  History of Present Illness    Jeanne [name] is a 63 y.o. female who presents via audio/video conferencing for a telehealth visit today.  Pt was last seen in cardiology clinic on 05/18/2023 by Rosaline Bane, NP.  At that time Jeanne [name] was doing well.  The patient is now pending procedure as outlined above. Since her last visit, she has done well from a cardiac standpoint.   She denies chest pain, palpitations, dyspnea, pnd, orthopnea, n, v, dizziness, syncope, edema, weight gain, or early satiety. All other systems reviewed and are otherwise negative except as noted  above.   Past Medical History    Past Medical History:  Diagnosis Date   Antiphospholipid antibody syndrome (HCC)    Anxiety    Arthritis    Bilateral swelling of feet    Carpal tunnel syndrome 01/02/2014   Bilateral   Clotting disorder (HCC)    Collagen disease (HCC) 04/21/2021   Constipation    Depression    Diabetes mellitus type 2, insulin  dependent (HCC) 02/26/2019   Difficult intubation 07/09/2013   Glidescope used, see Anesthesia note   DVT (deep venous thrombosis) (HCC) 2014   LEFT LEG   Gastritis    GERD (gastroesophageal reflux disease)    Glaucoma    Heart murmur    History of blood clots    Hyperlipidemia    Hypertension    Hypokalemia 03/05/2013   Kidney disease    stage ll   Neuromuscular disorder (HCC)     NEUROPATHY IN  MY HANDS   Obsessive-compulsive disorder    Osteoarthritis    Rheumatoid arthritis (HCC) 2016   Schizo-affective psychosis (HCC)    Schizoaffective disorder (HCC)    Scleritis of both eyes    Sleep apnea    Bipap   Suppurative hidradenitis    Undifferentiated connective tissue disease (HCC)    Past Surgical History:  Procedure Laterality Date   BUNIONECTOMY     CERVICAL CONIZATION W/BX     COLONOSCOPY WITH PROPOFOL  N/A 03/13/2019   Procedure: COLONOSCOPY WITH PROPOFOL ;  Surgeon: Legrand Victory LITTIE DOUGLAS, MD;  Location: [facility];  Service: Gastroenterology;  Laterality: N/A;   LAPAROSCOPIC ADRENALECTOMY Left 07/09/2013   Procedure: LAPAROSCOPIC LEFT  ADRENALECTOMY;  Surgeon: Krystal CHRISTELLA Spinner, MD;  Location: [facility];  Service: General;  Laterality: Left;   POLYPECTOMY  03/13/2019   Procedure: POLYPECTOMY;  Surgeon: Legrand Victory LITTIE DOUGLAS, MD;  Location: [facility];  Service: Gastroenterology;;   sweat gland removal  1975   TOTAL HIP ARTHROPLASTY Left 07/04/2023   Procedure: ARTHROPLASTY, HIP, TOTAL, ANTERIOR APPROACH;  Surgeon: [name] Lerner, MD;  Location: [facility];  Service: Orthopedics;  Laterality: Left;    Allergies  Allergies   Allergen Reactions   Ace Inhibitors Anaphylaxis   Arava [Leflunomide] Other (See Comments)    Nephritis and kidney clearance issues   Travatan [Travoprost] Other (See Comments)    Red eyes, irritation    Methotrexate  Other (See Comments)    Renal issues    Home Medications    Prior to Admission medications   Medication Sig Start Date End Date Taking? Authorizing Provider  amLODipine  (NORVASC ) 5 MG tablet Take 5 mg by mouth every morning.     [provider]  ARIPiprazole  (ABILIFY ) 30 MG tablet Take 1 tablet (30 mg total) by mouth daily. 09/16/23   Eappen, Saramma, MD  atorvastatin  (LIPITOR) 40 MG tablet Take 1 tablet (40 mg total) by mouth daily. Patient taking differently: Take 40 mg by mouth at bedtime. 05/18/23   Swinyer, Rosaline HERO, NP  buPROPion  (WELLBUTRIN  XL) 150 MG 24 hr tablet Take 3 tablets (450 mg total) by mouth daily. 09/16/23   Eappen, Saramma, MD  Calcium  Carb-Cholecalciferol (CALCIUM  + D3 PO) Take 1 tablet by mouth in the morning.    [provider]  Coenzyme Q10 (COQ-10 PO) Take 300 mg by mouth in the morning.    [provider]  DULoxetine  (CYMBALTA ) 30 MG capsule Take 3 capsules (90 mg total) by mouth daily. 09/16/23   Eappen, Saramma, MD  enoxaparin  (LOVENOX ) 150 MG/ML injection Inject 1 mL (150 mg total) into the skin daily. 05/31/23     esomeprazole (NEXIUM) 40 MG capsule Take 40 mg by mouth daily before breakfast.    [provider]  furosemide (LASIX) 40 MG tablet Take 40 mg by mouth in the morning.    [provider]  GLUCOSAMINE-CHONDROITIN PO Take 2 tablets by mouth every morning.    [provider]  inFLIXimab-abda (RENFLEXIS IV) Inject into the vein.    [provider]  inFLIXimab-dyyb 3 mg/kg in sodium chloride  0.9 % Inject 3 mg/kg into the vein every 8 (eight) weeks.    [provider]  Anselm Oil 300 MG CAPS Take 300 mg by mouth in the morning.    [provider]  levobunolol   (BETAGAN ) 0.5 % ophthalmic solution Place 1 drop into both eyes every morning.     [provider]  methocarbamol  (ROBAXIN ) 500 MG tablet Take 1 tablet (500 mg total) by mouth every 6 (six) hours as needed for muscle spasms. 07/05/23   Edmisten, Kristie L, PA  Multiple Vitamin (MULTIVITAMIN WITH MINERALS) TABS tablet Take 1 tablet by mouth in the morning.    [provider]  NIFEdipine  (PROCARDIA  XL/NIFEDICAL XL) 60 MG 24 hr tablet Take 60 mg by mouth every morning. 03/15/23   [provider]  ondansetron  (ZOFRAN ) 4 MG tablet Take 1 tablet (4 mg total) by mouth every 6 (six) hours as needed for nausea. 07/05/23   Edmisten, Roxie LITTIE, PA  oxyCODONE  (OXY IR/ROXICODONE ) 5 MG immediate release tablet Take 1-2 tablets (5-10 mg total) by mouth every 6 (six) hours as needed for severe pain (pain score 7-10). 07/05/23   Edmisten, Roxie LITTIE, PA  oxyCODONE -acetaminophen  (PERCOCET/ROXICET) 5-325 MG tablet Take 1 tablet by mouth 3 (three) times daily as needed for severe pain (pain score 7-10) (pain.).    [provider]  PRESCRIPTION MEDICATION Inhale 1 application into the lungs at bedtime. BiPAP setting 8/12    [provider]  QUEtiapine  (SEROQUEL ) 300 MG tablet Take 2 tablets (600 mg total) by mouth at bedtime. 09/16/23   Eappen, Saramma, MD  tirzepatide  (MOUNJARO ) 15 MG/0.5ML Pen Inject 15 mg into the skin once a week. 09/01/23     tirzepatide  (MOUNJARO ) 15 MG/0.5ML Pen Inject 15 mg into the skin once a week. 12/22/23     Turmeric POWD Take 1,500 mg by mouth in the morning.    [provider]  verapamil  (CALAN -SR) 180 MG CR tablet Take 1 tablet (180 mg total) by mouth daily. 07/16/13   Eletha Boas, MD    Physical Exam    Vital Signs:  Jeanne [name] does not have vital signs available for review today.  Given telephonic nature of communication, physical exam is limited. AAOx3. NAD. Normal affect.  Speech and respirations are  unlabored.  Accessory Clinical Findings    None  Assessment & Plan    1.  Preoperative Cardiovascular Risk Assessment:  According to the Revised Cardiac Risk Index (RCRI), her Perioperative Risk of Major Cardiac Event is (%): 0.4. Her Functional Capacity in METs is: 7.01 according to the Duke Activity Status Index (DASI).Therefore, based on ACC/AHA guidelines, patient would be at acceptable risk for the planned procedure without further cardiovascular testing.   The patient was advised that if she develops new symptoms prior to surgery to contact our office to arrange for a follow-up visit, and she verbalized understanding.  Patient takes 2 baby aspirin  daily.  This is managed per PCP.  Recommendations for holding aspirin  prior to surgery to come from managing provider.  A copy of this note will be routed to requesting surgeon.  Time:   Today, I have spent 5 minutes with the patient with telehealth technology discussing medical history, symptoms, and management plan.     Damien JAYSON Braver, NP  01/09/2024, 9:49 AM

## 2024-01-11 ENCOUNTER — Ambulatory Visit (INDEPENDENT_AMBULATORY_CARE_PROVIDER_SITE_OTHER): Admitting: Psychology

## 2024-01-11 DIAGNOSIS — F331 Major depressive disorder, recurrent, moderate: Secondary | ICD-10-CM | POA: Diagnosis not present

## 2024-01-11 NOTE — Progress Notes (Signed)
 [facility] Counselor/Therapist Progress Note  Patient ID: Jeanne [name], MRN: [id number],    Date: 01/11/2024  Time Spent: 2:00pm-2:45pm   45 minutes   Treatment Type: Individual Therapy  Reported Symptoms: stress  Mental Status Exam: Appearance:  Casual     Behavior: Appropriate  Motor: Normal  Speech/Language:  Normal Rate  Affect: Appropriate  Mood: normal  Thought process: normal  Thought content:   WNL  Sensory/Perceptual disturbances:   WNL  Orientation: oriented to person, place, time/date, and situation  Attention: Good  Concentration: Good  Memory: WNL  Fund of knowledge:  Good  Insight:   Good  Judgment:  Good  Impulse Control: Good   Risk Assessment: Danger to Self:  No Self-injurious Behavior: No Danger to Others: No Duty to Warn:no Physical Aggression / Violence:No  Access to Firearms a concern: No  Gang Involvement:No   Subjective: Pt present for face-to-face individual therapy in person.  Pt talked about having a knee replacement October 13th.  Pt does not have anyone to help her out post op.  She is a little worried about that.  She will spend a night in the hospital. Addressed how pt can ask her family to help with any care needs she may have.  Pt talked about church.  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  Pt is considering doing a chaplain program through [facility].  Addressed how this would impact pt's mental health.  Pt has shared that she tries to limit stress in order to maintain good mental health.   Addressed the inherent stress in a hospital system and working with trauma and grief and loss.  Worked with pt on considering looking into volunteer opportunities at the hospital so she can see if that setting is a good fit for her.   Provided supportive therapy.  Interventions: Cognitive Behavioral Therapy and Insight-Oriented  Diagnosis: F33.1  Plan of Care: Recommend ongoing therapy.   Pt  participated in setting treatment goals.  Plan to continue to meet monthly.  Pt agrees with treatment plan.   Treatment Plan  (Treatment Plan Target Date:  10/18/2024 ) Client Abilities/Strengths  Pt is bright, engaging, and motivated for therapy.   Client Treatment Preferences  Individual therapy.  Client Statement of Needs  Improve coping skills.  Symptoms  Depressed or irritable mood.  Problems Addressed  Unipolar Depression Goals 1. Alleviate depressive symptoms and return to previous level of effective functioning. 2. Appropriately grieve the loss in order to normalize mood and to return to previously adaptive level of functioning. Objective Learn and implement behavioral strategies to overcome depression. Target Date: 2024-10-18 Frequency: Monthly  Progress: 60 Modality: individual  Related Interventions Engage the client in behavioral activation, increasing his/her activity level and contact with sources of reward, while identifying processes that inhibit activation.  Use behavioral techniques such as instruction, rehearsal, role-playing, role reversal, as needed, to facilitate activity in the client's daily life; reinforce success. Assist the client in developing skills that increase the likelihood of deriving pleasure from behavioral activation (e.g., assertiveness skills, developing an exercise plan, less internal/more external focus, increased social involvement); reinforce success. Objective Identify important people in life, past and present, and describe the quality, good and poor, of those relationships. Target Date: 2024-10-18 Frequency: Monthly  Progress: 60 Modality: individual  Related Interventions Conduct Interpersonal Therapy beginning with the assessment of the client's interpersonal inventory of important past and present relationships; develop a case formulation linking depression to grief,  interpersonal role disputes, role transitions, and/or interpersonal  deficits). Objective Learn and implement problem-solving and decision-making skills. Target Date: 2024-10-18 Frequency: Monthly  Progress: 60 Modality: individual  Related Interventions Conduct Problem-Solving Therapy using techniques such as psychoeducation, modeling, and role-playing to teach client problem-solving skills (i.e., defining a problem specifically, generating possible solutions, evaluating the pros and cons of each solution, selecting and implementing a plan of action, evaluating the efficacy of the plan, accepting or revising the plan); role-play application of the problem-solving skill to a real life issue. Encourage in the client the development of a positive problem orientation in which problems and solving them are viewed as a natural part of life and not something to be feared, despaired, or avoided. 3. Develop healthy interpersonal relationships that lead to the alleviation and help prevent the relapse of depression. 4. Develop healthy thinking patterns and beliefs about self, others, and the world that lead to the alleviation and help prevent the relapse of depression. 5. Recognize, accept, and cope with feelings of depression. Diagnosis F33.1  Conditions For Discharge Achievement of treatment goals and objectives   [name], LCSW

## 2024-01-18 NOTE — Patient Instructions (Addendum)
 SURGICAL WAITING ROOM VISITATION Patients having surgery or a procedure may have no more than 2 support people in the waiting area - these visitors may rotate in the visitor waiting room.   If the patient needs to stay at the hospital during part of their recovery, the visitor guidelines for inpatient rooms apply.  PRE-OP VISITATION  Pre-op nurse will coordinate an appropriate time for 1 support person to accompany the patient in pre-op.  This support person may not rotate.  This visitor will be contacted when the time is appropriate for the visitor to come back in the pre-op area.  Please refer to the [facility] website for the visitor guidelines for Inpatients (after your surgery is over and you are in a regular room).  You are not required to quarantine at this time prior to your surgery. However, you must do this: Hand Hygiene often Do NOT share personal items Notify your provider if you are in close contact with someone who has COVID or you develop fever 100.4 or greater, new onset of sneezing, cough, sore throat, shortness of breath or body aches.  If you test positive for Covid or have been in contact with anyone that has tested positive in the last 10 days please notify you surgeon.    Your procedure is scheduled on:  Monday  January 30, 2024  Report to [facility] Main Entrance: [address] entrance where the [employer] is available.   Report to admitting at:   08:00  AM  Call this number if you have any questions or problems the morning of surgery [phone]  Do not eat food after Midnight the night prior to your surgery/procedure.  After Midnight you may have the following liquids until   07:30 AM DAY OF SURGERY  Clear Liquid Diet Water  Black Coffee (sugar ok, NO MILK/CREAM OR CREAMERS)  Tea (sugar ok, NO MILK/CREAM OR CREAMERS) regular and decaf                             Plain Jell-O  with no fruit (NO RED)                                           Fruit  ices (not with fruit pulp, NO RED)                                     Popsicles (NO RED)                                                                  Juice: NO CITRUS JUICES: only apple, WHITE grape, WHITE cranberry Sports drinks like Gatorade or Powerade (NO RED)                   The day of surgery:  Drink ONE (1) Pre-Surgery G2 at  07:30   AM the morning of surgery. Drink in one sitting. Do not sip.  This drink was given to you during your hospital pre-op appointment visit. Nothing else to drink  after completing the Pre-Surgery G2 : No candy, chewing gum or throat lozenges.    FOLLOW ANY ADDITIONAL PRE OP INSTRUCTIONS YOU RECEIVED FROM YOUR SURGEON'S OFFICE!!!   Oral Hygiene is also important to reduce your risk of infection.        Remember - BRUSH YOUR TEETH THE MORNING OF SURGERY WITH YOUR REGULAR TOOTHPASTE  Do NOT smoke after Midnight the night before surgery.  STOP TAKING all Vitamins, Herbs and supplements 1 week before your surgery.   MOUNJARO -  Stop injections 10-14 days before surgery.  Last injection would be on Thursday 01-19-2024  Take ONLY these medicines the morning of surgery with A SIP OF WATER : Quetiapine  (Seroquel ), aripiprazole  (Abilify ), Duloxetine , Bupropion   ????   You may use your Eye drops if needed.    If You have been diagnosed with Sleep Apnea - Bring CPAP mask and tubing day of surgery. We will provide you with a CPAP machine on the day of your surgery.                   You may not have any metal on your body including hair pins, jewelry, and body piercing  Do not wear make-up, lotions, powders, perfumes or deodorant  Do not wear nail polish including gel and S&S, artificial / acrylic nails, or any other type of covering on natural nails including finger and toenails. If you have artificial nails, gel coating, etc., that needs to be removed by a nail salon, Please have this removed prior to surgery. Not doing so may mean that your surgery could  be cancelled or delayed if the Surgeon or anesthesia staff feels like they are unable to monitor you safely.   Do not shave 48 hours prior to surgery to avoid nicks in your skin which may contribute to postoperative infections.   Contacts, Hearing Aids, dentures or bridgework may not be worn into surgery. DENTURES WILL BE REMOVED PRIOR TO SURGERY PLEASE DO NOT APPLY Poly grip OR ADHESIVES!!!  You may bring a small overnight bag with you on the day of surgery, only pack items that are not valuable. [facility] IS NOT RESPONSIBLE   FOR VALUABLES THAT ARE LOST OR STOLEN.   Do not bring your home medications to the hospital. The Pharmacy will dispense medications listed on your medication list to you during your admission in the Hospital.  Please read over the following fact sheets you were given: IF YOU HAVE QUESTIONS ABOUT YOUR PRE-OP INSTRUCTIONS, PLEASE CALL [phone].    Pre-operative 4 CHG Bath Instructions   You can play a key role in reducing the risk of infection after surgery. Your skin needs to be as free of germs as possible. You can reduce the number of germs on your skin by washing with CHG (chlorhexidine  gluconate) soap before surgery. CHG is an antiseptic soap that kills germs and continues to kill germs even after washing.   DO NOT use if you have an allergy to chlorhexidine /CHG or antibacterial soaps. If your skin becomes reddened or irritated, stop using the CHG and notify one of our RNs at   Please shower with the CHG soap starting 4 days before surgery using the following schedule:      Thursday  01-26-2024    Please keep in mind the following:  DO NOT shave, including legs and underarms, starting the day of your first shower.   You may shave your face at any point before/day of surgery.  Place clean sheets on  your bed the day you start using CHG soap. Use a clean washcloth (not used since being washed) for each shower. DO NOT sleep with pets once you start using the  CHG.  CHG Shower Instructions:  If you choose to wash your hair and private area, wash first with your normal shampoo/soap.  After you use shampoo/soap, rinse your hair and body thoroughly to remove shampoo/soap residue.  Turn the water  OFF and apply about 3 tablespoons (45 ml) of CHG soap to a CLEAN washcloth.  Apply CHG soap ONLY FROM YOUR NECK DOWN TO YOUR TOES (washing for 3-5 minutes)  DO NOT use CHG soap on face, private areas, open wounds, or sores.  Pay special attention to the area where your surgery is being performed.  If you are having back surgery, having someone wash your back for you may be helpful. Wait 2 minutes after CHG soap is applied, then you may rinse off the CHG soap.  Pat dry with a clean towel  Put on clean clothes/pajamas   If you choose to wear lotion, please use ONLY the CHG-compatible lotions on the back of this paper.     Additional instructions for the day of surgery: DO NOT APPLY any lotions, deodorants, cologne, or perfumes.   Put on clean/comfortable clothes.  Brush your teeth.  Ask your nurse before applying any prescription medications to the skin.   CHG Compatible Lotions   Aveeno Moisturizing lotion  Cetaphil Moisturizing Cream  Cetaphil Moisturizing Lotion  Clairol Herbal Essence Moisturizing Lotion, Dry Skin  Clairol Herbal Essence Moisturizing Lotion, Extra Dry Skin  Clairol Herbal Essence Moisturizing Lotion, Normal Skin  Curel Age Defying Therapeutic Moisturizing Lotion with Alpha Hydroxy  Curel Extreme Care Body Lotion  Curel Soothing Hands Moisturizing Hand Lotion  Curel Therapeutic Moisturizing Cream, Fragrance-Free  Curel Therapeutic Moisturizing Lotion, Fragrance-Free  Curel Therapeutic Moisturizing Lotion, Original Formula  Eucerin Daily Replenishing Lotion  Eucerin Dry Skin Therapy Plus Alpha Hydroxy Crme  Eucerin Dry Skin Therapy Plus Alpha Hydroxy Lotion  Eucerin Original Crme  Eucerin Original Lotion  Eucerin Plus Crme  Eucerin Plus Lotion  Eucerin TriLipid Replenishing Lotion  Keri Anti-Bacterial Hand Lotion  Keri Deep Conditioning Original Lotion Dry Skin Formula Softly Scented  Keri Deep Conditioning Original Lotion, Fragrance Free Sensitive Skin Formula  Keri Lotion Fast Absorbing Fragrance Free Sensitive Skin Formula  Keri Lotion Fast Absorbing Softly Scented Dry Skin Formula  Keri Original Lotion  Keri Skin Renewal Lotion Keri Silky Smooth Lotion  Keri Silky Smooth Sensitive Skin Lotion  Nivea Body Creamy Conditioning Oil  Nivea Body Extra Enriched Lotion  Nivea Body Original Lotion  Nivea Body Sheer Moisturizing Lotion Nivea Crme  Nivea Skin Firming Lotion  NutraDerm 30 Skin Lotion  NutraDerm Skin Lotion  NutraDerm Therapeutic Skin Cream  NutraDerm Therapeutic Skin Lotion  ProShield Protective Hand Cream  Provon moisturizing lotion   FAILURE TO FOLLOW THESE INSTRUCTIONS MAY RESULT IN THE CANCELLATION OF YOUR SURGERY  PATIENT SIGNATURE_________________________________  NURSE SIGNATURE__________________________________  ________________________________________________________________________         Nasario Exon    An incentive spirometer is a tool that can help keep your lungs clear and active. This tool measures how well you are filling your lungs with each breath. Taking long deep breaths may help reverse or decrease the chance of developing breathing (pulmonary) problems (especially infection) following: A long period of time when you are unable to move or be active. BEFORE THE PROCEDURE  If the spirometer includes an indicator to  show your best effort, your nurse or respiratory therapist will set it to a desired goal. If possible, sit up straight or lean slightly forward. Try not to slouch. Hold the incentive spirometer in an upright position. INSTRUCTIONS FOR USE  Sit on the edge of your bed if possible, or sit up as far as you can in bed or on a chair. Hold the  incentive spirometer in an upright position. Breathe out normally. Place the mouthpiece in your mouth and seal your lips tightly around it. Breathe in slowly and as deeply as possible, raising the piston or the ball toward the top of the column. Hold your breath for 3-5 seconds or for as long as possible. Allow the piston or ball to fall to the bottom of the column. Remove the mouthpiece from your mouth and breathe out normally. Rest for a few seconds and repeat Steps 1 through 7 at least 10 times every 1-2 hours when you are awake. Take your time and take a few normal breaths between deep breaths. The spirometer may include an indicator to show your best effort. Use the indicator as a goal to work toward during each repetition. After each set of 10 deep breaths, practice coughing to be sure your lungs are clear. If you have an incision (the cut made at the time of surgery), support your incision when coughing by placing a pillow or rolled up towels firmly against it. Once you are able to get out of bed, walk around indoors and cough well. You may stop using the incentive spirometer when instructed by your caregiver.  RISKS AND COMPLICATIONS Take your time so you do not get dizzy or light-headed. If you are in pain, you may need to take or ask for pain medication before doing incentive spirometry. It is harder to take a deep breath if you are having pain. AFTER USE Rest and breathe slowly and easily. It can be helpful to keep track of a log of your progress. Your caregiver can provide you with a simple table to help with this. If you are using the spirometer at home, follow these instructions: SEEK MEDICAL CARE IF:  You are having difficultly using the spirometer. You have trouble using the spirometer as often as instructed. Your pain medication is not giving enough relief while using the spirometer. You develop fever of 100.5 F (38.1 C) or higher.                                                                                                     SEEK IMMEDIATE MEDICAL CARE IF:  You cough up bloody sputum that had not been present before. You develop fever of 102 F (38.9 C) or greater. You develop worsening pain at or near the incision site. MAKE SURE YOU:  Understand these instructions. Will watch your condition. Will get help right away if you are not doing well or get worse. Document Released: 08/16/2006 Document Revised: 06/28/2011 Document Reviewed: 10/17/2006 [facility] Patient Information 2014 [place], [place].         If you would like to see  a video about joint replacement:   [email or web address]

## 2024-01-18 NOTE — Progress Notes (Signed)
 COVID Vaccine received:  []  No [x]  Yes Date of any COVID positive Test in last 90 days: none  PCP - Glendia Freeman, MD clearance scanned to media Endo- Theodoro Sax, MD at [facility] Nephrology- Dr. Norine at [facility] Cardiologist - Medford Cash, MD, Damien Braver, NP cardiac clearance in 01-09-2024  Epic note Rheumatology- Jon Jacob, MD   Chest x-ray - 02-01-2018  2v Epic  CT Chest 12-14-22 Epic EKG - 05-18-2023  Epic  Stress Test -  ECHO -  03-24-2018  Epic Cardiac Cath -  CT Coronary Calcium  score:   Pacemaker / ICD device [x]  No []  Yes   Spinal Cord Stimulator:[x]  No []  Yes       History of Sleep Apnea? []  No [x]  Yes   CPAP used?- []  No [x]  Yes  BiPAP  Patient has: []  NO Hx DM   [x]  Pre-DM   []  DM1  []   DM2 Does the patient monitor blood sugar?   []  N/A   [x]  No []  Yes  Last A1c was:  5.4 on  11-24-23     Blood Thinner / Instructions: none Aspirin  Instructions:  ASA 81 mg x 2   Hold x 5 days as per patient's MD instruction   Dental hx: []  Dentures:  []  N/A      []  Bridge or Partial:                   [x]  Loose or Damaged teeth:   Activity level: Able to walk up 2 flights of stairs without becoming significantly short of breath or having chest pain?  []  No   [x]    Yes  Patient can perform ADLs without assistance. []  No   [x]   Yes  Anesthesia review: HTN, CKD3, Murmur (ECHO 03-2028) hx LLE DVT 2014, antiphospholipid syndrome,  Pre-DM, RA, schizoaffective d/o, smoker  Patient denies any S&S of respiratory illness or Covid - no shortness of breath, fever, cough or chest pain at PAT appointment.  Patient verbalized understanding and agreement to the Pre-Surgical Instructions that were given to them at this PAT appointment. Patient was also educated of the need to review these PAT instructions again prior to her surgery.I reviewed the appropriate phone numbers to call if they have any and questions or concerns.

## 2024-01-23 ENCOUNTER — Encounter (HOSPITAL_COMMUNITY)
Admission: RE | Admit: 2024-01-23 | Discharge: 2024-01-23 | Disposition: A | Source: Ambulatory Visit | Attending: Orthopedic Surgery

## 2024-01-23 ENCOUNTER — Other Ambulatory Visit: Payer: Self-pay

## 2024-01-23 ENCOUNTER — Encounter (HOSPITAL_COMMUNITY): Payer: Self-pay

## 2024-01-23 VITALS — BP 104/54 | HR 70 | Temp 98.4°F | Resp 16 | Ht 69.0 in | Wt 222.0 lb

## 2024-01-23 DIAGNOSIS — R7303 Prediabetes: Secondary | ICD-10-CM

## 2024-01-23 DIAGNOSIS — Z79899 Other long term (current) drug therapy: Secondary | ICD-10-CM | POA: Diagnosis not present

## 2024-01-23 DIAGNOSIS — N182 Chronic kidney disease, stage 2 (mild): Secondary | ICD-10-CM | POA: Insufficient documentation

## 2024-01-23 DIAGNOSIS — M1712 Unilateral primary osteoarthritis, left knee: Secondary | ICD-10-CM | POA: Diagnosis not present

## 2024-01-23 DIAGNOSIS — Z01818 Encounter for other preprocedural examination: Secondary | ICD-10-CM

## 2024-01-23 DIAGNOSIS — M069 Rheumatoid arthritis, unspecified: Secondary | ICD-10-CM | POA: Diagnosis not present

## 2024-01-23 DIAGNOSIS — I10 Essential (primary) hypertension: Secondary | ICD-10-CM | POA: Diagnosis not present

## 2024-01-23 DIAGNOSIS — E1122 Type 2 diabetes mellitus with diabetic chronic kidney disease: Secondary | ICD-10-CM | POA: Insufficient documentation

## 2024-01-23 DIAGNOSIS — D6861 Antiphospholipid syndrome: Secondary | ICD-10-CM | POA: Diagnosis not present

## 2024-01-23 DIAGNOSIS — Z01812 Encounter for preprocedural laboratory examination: Secondary | ICD-10-CM | POA: Diagnosis present

## 2024-01-23 HISTORY — DX: Other complications of anesthesia, initial encounter: T88.59XA

## 2024-01-23 LAB — CBC
HCT: 39.3 % (ref 36.0–46.0)
Hemoglobin: 12.7 g/dL (ref 12.0–15.0)
MCH: 31.9 pg (ref 26.0–34.0)
MCHC: 32.3 g/dL (ref 30.0–36.0)
MCV: 98.7 fL (ref 80.0–100.0)
Platelets: 164 K/uL (ref 150–400)
RBC: 3.98 MIL/uL (ref 3.87–5.11)
RDW: 12.8 % (ref 11.5–15.5)
WBC: 4.3 K/uL (ref 4.0–10.5)
nRBC: 0 % (ref 0.0–0.2)

## 2024-01-23 LAB — COMPREHENSIVE METABOLIC PANEL WITH GFR
ALT: 22 U/L (ref 0–44)
AST: 23 U/L (ref 15–41)
Albumin: 3.9 g/dL (ref 3.5–5.0)
Alkaline Phosphatase: 94 U/L (ref 38–126)
Anion gap: 7 (ref 5–15)
BUN: 22 mg/dL (ref 8–23)
CO2: 25 mmol/L (ref 22–32)
Calcium: 9.3 mg/dL (ref 8.9–10.3)
Chloride: 107 mmol/L (ref 98–111)
Creatinine, Ser: 1.22 mg/dL — ABNORMAL HIGH (ref 0.44–1.00)
GFR, Estimated: 50 mL/min — ABNORMAL LOW (ref >60)
Glucose, Bld: 95 mg/dL (ref 70–99)
Potassium: 5 mmol/L (ref 3.5–5.1)
Sodium: 139 mmol/L (ref 135–145)
Total Bilirubin: 0.4 mg/dL (ref 0.0–1.2)
Total Protein: 6.6 g/dL (ref 6.5–8.1)

## 2024-01-23 LAB — SURGICAL PCR SCREEN
MRSA, PCR: NEGATIVE
Staphylococcus aureus: NEGATIVE

## 2024-01-24 NOTE — Progress Notes (Signed)
 Anesthesia Chart Review   Case: [id number] Date/Time: 01/30/24 1015   Procedure: ARTHROPLASTY, KNEE, TOTAL (Left: Knee)   Anesthesia type: Choice   Pre-op diagnosis: Left knee osteoarthritis   Location: [facility]OM 10 / [facility]   Surgeons: [name] Lerner, MD       DISCUSSION:63 y.o. smoker with h/o HTN, sleep apnea on bipap, antiphospholipid antibody syndrome, rheumatoid arthritis, COPD, DM II (A1C 5.4), CKD Stage II, left knee OA scheduled for above procedure 01/30/2024 with Dr. Lerner [name].   Prior anesthesia complications include difficult intubation. Per anesthesia note 07/09/2013, Difficulty Due To: Difficulty was unanticipated, Difficult Airway- due to reduced neck mobility and Difficult Airway- due to anterior larynx Future Recommendations: Recommend- induction with short-acting agent, and alternative techniques readily available Comments: Attempted oral intubation per CRNA x2 with Cleotilde 2.  Unable to visualize cords.  Dr. Andria attempted with Cleotilde 3 and head cushion removed without success.  LMA 4.0 placed and  Glidescope sent for and on arrival, 3.0 blade placed on glidescope.  Difficult view but airway secured on 3rd attempt.  Pt seen by cardiology 01/09/24 for preoperative evaluation. Per OV note, According to the Revised Cardiac Risk Index (RCRI), her Perioperative Risk of Major Cardiac Event is (%): 0.4. Her Functional Capacity in METs is: 7.01 according to the Duke Activity Status Index (DASI).Therefore, based on ACC/AHA guidelines, patient would be at acceptable risk for the planned procedure without further cardiovascular testing.    The patient was advised that if she develops new symptoms prior to surgery to contact our office to arrange for a follow-up visit, and she verbalized understanding.   Patient takes 2 baby aspirin  daily.  This is managed per PCP.  Recommendations for holding aspirin  prior to surgery to come from managing provider.  Pt advised to hold Mounjaro  1  week prior to surgery.  VS: BP (!) 104/54 Comment: right arm sitting  Pulse 70   Temp 36.9 C (Oral)   Resp 16   Ht 5' 9 (1.753 m)   Wt 100.7 kg   SpO2 100%   BMI 32.78 kg/m   PROVIDERS: Larnell Hamilton, MD is PCP    LABS: Labs reviewed: Acceptable for surgery. (all labs ordered are listed, but only abnormal results are displayed)  Labs Reviewed  COMPREHENSIVE METABOLIC PANEL WITH GFR - Abnormal; Notable for the following components:      Result Value   Creatinine, Ser 1.22 (*)    GFR, Estimated 50 (*)    All other components within normal limits  SURGICAL PCR SCREEN  CBC     IMAGES:   EKG:   CV: Echo 03/24/2018 - Left ventricle: The cavity size was normal. Wall thickness was    increased in a pattern of moderate LVH. Systolic function was    normal. The estimated ejection fraction was in the range of 55%    to 60%. Wall motion was normal; there were no regional wall    motion abnormalities. There was no evidence of elevated    ventricular filling pressure by Doppler parameters.  - Tricuspid valve: There was mild regurgitation.  Past Medical History:  Diagnosis Date   Antiphospholipid antibody syndrome    Anxiety    Arthritis    Bilateral swelling of feet    Carpal tunnel syndrome 01/02/2014   Bilateral   Clotting disorder    Collagen disease 04/21/2021   Complication of anesthesia    use glidescope   Constipation    Depression    Diabetes mellitus  type 2, insulin  dependent (HCC) 02/26/2019   Difficult intubation 07/09/2013   Glidescope used, see Anesthesia note   DVT (deep venous thrombosis) (HCC) 2014   LEFT LEG   Gastritis    GERD (gastroesophageal reflux disease)    Glaucoma    Heart murmur    History of blood clots    Hyperlipidemia    Hypertension    Hypokalemia 03/05/2013   Kidney disease    stage ll   Neuromuscular disorder (HCC)     NEUROPATHY IN  MY HANDS   Obsessive-compulsive disorder    Osteoarthritis    Rheumatoid arthritis  (HCC) 2016   Schizo-affective psychosis (HCC)    Schizoaffective disorder (HCC)    Scleritis of both eyes    Sleep apnea    Bipap   Suppurative hidradenitis    Undifferentiated connective tissue disease     Past Surgical History:  Procedure Laterality Date   BUNIONECTOMY     CERVICAL CONIZATION W/BX     COLONOSCOPY WITH PROPOFOL  N/A 03/13/2019   Procedure: COLONOSCOPY WITH PROPOFOL ;  Surgeon: Legrand Victory LITTIE DOUGLAS, MD;  [facility];  Service: Gastroenterology;  Laterality: N/A;   LAPAROSCOPIC ADRENALECTOMY Left 07/09/2013   Procedure: LAPAROSCOPIC LEFT  ADRENALECTOMY;  Surgeon: Krystal CHRISTELLA Spinner, MD;  [facility];  Service: General;  Laterality: Left;   POLYPECTOMY  03/13/2019   Procedure: POLYPECTOMY;  Surgeon: Legrand Victory LITTIE DOUGLAS, MD;  Location: [facility];  Service: Gastroenterology;;   sweat gland removal  1975   TOTAL HIP ARTHROPLASTY Left 07/04/2023   Procedure: ARTHROPLASTY, HIP, TOTAL, ANTERIOR APPROACH;  Surgeon: [name] Lerner, MD;  [facility];  Service: Orthopedics;  Laterality: Left;    MEDICATIONS:  amLODipine  (NORVASC ) 5 MG tablet   ARIPiprazole  (ABILIFY ) 30 MG tablet   aspirin  EC 81 MG tablet   atorvastatin  (LIPITOR) 40 MG tablet   buPROPion  (WELLBUTRIN  XL) 150 MG 24 hr tablet   Calcium  Carb-Cholecalciferol (CALCIUM  + D3 PO)   Coenzyme Q10 (COQ-10 PO)   DULoxetine  (CYMBALTA ) 30 MG capsule   esomeprazole (NEXIUM) 40 MG capsule   furosemide (LASIX) 40 MG tablet   GLUCOSAMINE-CHONDROITIN PO   inFLIXimab-dyyb 3 mg/kg in sodium chloride  0.9 %   Krill Oil 300 MG CAPS   levobunolol  (BETAGAN ) 0.5 % ophthalmic solution   Multiple Vitamin (MULTIVITAMIN WITH MINERALS) TABS tablet   NIFEdipine  (PROCARDIA  XL/NIFEDICAL XL) 60 MG 24 hr tablet   oxyCODONE -acetaminophen  (PERCOCET/ROXICET) 5-325 MG tablet   PRESCRIPTION MEDICATION   QUEtiapine  (SEROQUEL ) 300 MG tablet   tirzepatide  (MOUNJARO ) 15 MG/0.5ML Pen   tirzepatide  (MOUNJARO ) 15 MG/0.5ML Pen   Turmeric  POWD   verapamil  (CALAN -SR) 180 MG CR tablet   No current facility-administered medications for this encounter.    Harlene Hoots Ward, PA-C [facility] [phone]

## 2024-01-24 NOTE — Anesthesia Preprocedure Evaluation (Addendum)
 Anesthesia Evaluation  Patient identified by MRN, date of birth, ID band Patient awake    Reviewed: Allergy & Precautions, NPO status , Patient's Chart, lab work & pertinent test results  History of Anesthesia Complications (+) DIFFICULT AIRWAY and history of anesthetic complications  Airway Mallampati: III  TM Distance: >3 FB Neck ROM: Full   Comment: Required Glidescope intubation, easy mask with OPA Dental  (+) Dental Advisory Given, Poor Dentition, Missing Broken teeth:   Pulmonary neg shortness of breath, sleep apnea and Continuous Positive Airway Pressure Ventilation , neg COPD, neg recent URI, Current Smoker and Patient abstained from smoking.   Pulmonary exam normal breath sounds clear to auscultation       Cardiovascular hypertension (amlodipine , nifedipine , verapamil ), Pt. on medications (-) angina + DVT  (-) Past MI, (-) Cardiac Stents and (-) CABG + dysrhythmias (RBBB) + Valvular Problems/Murmurs (mild TR)  Rhythm:Regular Rate:Normal  HLD  TTE 03/24/2018: Study Conclusions   - Left ventricle: The cavity size was normal. Wall thickness was    increased in a pattern of moderate LVH. Systolic function was    normal. The estimated ejection fraction was in the range of 55%    to 60%. Wall motion was normal; there were no regional wall    motion abnormalities. There was no evidence of elevated    ventricular filling pressure by Doppler parameters.  - Tricuspid valve: There was mild regurgitation.     Neuro/Psych neg Seizures PSYCHIATRIC DISORDERS (OCD) Anxiety Depression  Schizophrenia  Chronic pain  Neuromuscular disease (neuropathy)    GI/Hepatic Neg liver ROS,GERD  Medicated,,  Endo/Other  diabetes, Type 2    Renal/GU CRFRenal disease     Musculoskeletal  (+) Arthritis , Osteoarthritis and Rheumatoid disorders,    Abdominal  (+) + obese  Peds  Hematology  (+) Blood dyscrasia (antiphospholipid antibody  syndrome) Lab Results      Component                Value               Date                      WBC                      4.3                 01/23/2024                HGB                      12.7                01/23/2024                HCT                      39.3                01/23/2024                MCV                      98.7                01/23/2024                PLT  164                 01/23/2024              Anesthesia Other Findings Last Mounjaro : 01/14/2024  Reproductive/Obstetrics                              Anesthesia Physical Anesthesia Plan  ASA: 3  Anesthesia Plan: MAC and Spinal   Post-op Pain Management: Ofirmev  IV (intra-op)* and Regional block*   Induction: Intravenous  PONV Risk Score and Plan: 1 and Ondansetron , Dexamethasone , Propofol  infusion, TIVA, Midazolam  and Treatment may vary due to age or medical condition  Airway Management Planned: Natural Airway and Simple Face Mask  Additional Equipment:   Intra-op Plan:   Post-operative Plan:   Informed Consent: I have reviewed the patients History and Physical, chart, labs and discussed the procedure including the risks, benefits and alternatives for the proposed anesthesia with the patient or authorized representative who has indicated his/her understanding and acceptance.     Dental advisory given  Plan Discussed with: CRNA and Anesthesiologist  Anesthesia Plan Comments: (See PAT note 01/23/2024  Discussed potential risks of nerve blocks including, but not limited to, infection, bleeding, nerve damage, seizures, pneumothorax, respiratory depression, and potential failure of the block. Alternatives to nerve blocks discussed. All questions answered.  I have discussed risks of neuraxial anesthesia including but not limited to infection, bleeding, nerve injury, back pain, headache, seizures, and failure of block. Patient denies bleeding disorders and  is not currently anticoagulated. Labs have been reviewed. Risks and benefits discussed. All patient's questions answered.   Discussed with patient risks of MAC including, but not limited to, minor pain or discomfort, hearing people in the room, and possible need for backup general anesthesia. Risks for general anesthesia also discussed including, but not limited to, sore throat, hoarse voice, chipped/damaged teeth, injury to vocal cords, nausea and vomiting, allergic reactions, lung infection, heart attack, stroke, and death. All questions answered. )         Anesthesia Quick Evaluation

## 2024-01-26 ENCOUNTER — Encounter: Payer: Self-pay | Admitting: Psychiatry

## 2024-01-26 ENCOUNTER — Other Ambulatory Visit: Payer: Self-pay

## 2024-01-26 ENCOUNTER — Ambulatory Visit: Admitting: Psychiatry

## 2024-01-26 VITALS — BP 126/80 | HR 72 | Temp 97.2°F | Ht 69.0 in | Wt 228.6 lb

## 2024-01-26 DIAGNOSIS — F17200 Nicotine dependence, unspecified, uncomplicated: Secondary | ICD-10-CM | POA: Diagnosis not present

## 2024-01-26 DIAGNOSIS — F429 Obsessive-compulsive disorder, unspecified: Secondary | ICD-10-CM

## 2024-01-26 DIAGNOSIS — F251 Schizoaffective disorder, depressive type: Secondary | ICD-10-CM | POA: Diagnosis not present

## 2024-01-26 NOTE — Progress Notes (Unsigned)
 [facility] MD OP Progress Note  01/26/2024 11:32 AM Jeanne [name]  MRN:  [id number]  Chief Complaint:  Chief Complaint  Patient presents with   Follow-up   Anxiety   Depression   Medication Refill   Discussed the use of AI scribe software for clinical note transcription with the patient, who gave verbal consent to proceed.  History of Present Illness Jeanne [name] is a 63 year old manage, lives in [place], has a history of schizoaffective disorder, OCD, hypertension, hyperal anemia, diabetes mellitus, glaucoma, history of obstructive sleep apnea, chronic pain, rheumatoid arthritis was evaluated in office today for a follow-up appointment.    She describes feeling good and stable. She denies experiencing auditory or visual hallucinations and reports feeling happy without sadness or hopelessness. She denies any thoughts of self-harm.  Her current medication regimen includes Abilify  30 mg, Seroquel  600 mg at bedtime, Wellbutrin  450 mg daily, and Cymbalta  90 mg daily. She expresses a strong preference to continue these medications, stating that life becomes very difficult for her without them.  She reports current tobacco use, smoking approximately 10 cigarettes per day. She states an intention to cut back or quit in the future but currently focuses on weight loss.  She lives in [place] and participates in a health and wellness program. She exercises at the gym three to four days per week, primarily doing water  aerobics at the [facility]. She monitors her diet as part of her weight loss efforts.  She denies any other concerns today.    Visit Diagnosis:    ICD-10-CM   1. Schizoaffective disorder, depressive type (HCC)  F25.1     2. Obsessive-compulsive disorder with good or fair insight  F42.9     3. Tobacco use disorder  F17.200    Moderate      Past Psychiatric History: I have reviewed past psychiatric history from progress note on 03/14/2023.  Denies previous  suicide attempts.  Past trials of medications include risperidone, Zyprexa, Prolixin, Prozac, Zoloft, Effexor , Lamictal  Past Medical History:  Past Medical History:  Diagnosis Date   Antiphospholipid antibody syndrome    Anxiety    Arthritis    Bilateral swelling of feet    Carpal tunnel syndrome 01/02/2014   Bilateral   Clotting disorder    Collagen disease 04/21/2021   Complication of anesthesia    use glidescope   Constipation    Depression    Diabetes mellitus type 2, insulin  dependent (HCC) 02/26/2019   Difficult intubation 07/09/2013   Glidescope used, see Anesthesia note   DVT (deep venous thrombosis) (HCC) 2014   LEFT LEG   Gastritis    GERD (gastroesophageal reflux disease)    Glaucoma    Heart murmur    History of blood clots    Hyperlipidemia    Hypertension    Hypokalemia 03/05/2013   Kidney disease    stage ll   Neuromuscular disorder (HCC)     NEUROPATHY IN  MY HANDS   Obsessive-compulsive disorder    Osteoarthritis    Rheumatoid arthritis (HCC) 2016   Schizo-affective psychosis (HCC)    Schizoaffective disorder (HCC)    Scleritis of both eyes    Sleep apnea    Bipap   Suppurative hidradenitis    Undifferentiated connective tissue disease     Past Surgical History:  Procedure Laterality Date   BUNIONECTOMY     CERVICAL CONIZATION W/BX     COLONOSCOPY WITH PROPOFOL  N/A 03/13/2019   Procedure: COLONOSCOPY WITH PROPOFOL ;  Surgeon:  Legrand Victory LITTIE DOUGLAS, MD;  Location: [facility];  Service: Gastroenterology;  Laterality: N/A;   LAPAROSCOPIC ADRENALECTOMY Left 07/09/2013   Procedure: LAPAROSCOPIC LEFT  ADRENALECTOMY;  Surgeon: Krystal CHRISTELLA Spinner, MD;  [facility];  Service: General;  Laterality: Left;   POLYPECTOMY  03/13/2019   Procedure: POLYPECTOMY;  Surgeon: Legrand Victory LITTIE DOUGLAS, MD;  Location: [facility];  Service: Gastroenterology;;   sweat gland removal  1975   TOTAL HIP ARTHROPLASTY Left 07/04/2023   Procedure: ARTHROPLASTY, HIP, TOTAL,  ANTERIOR APPROACH;  Surgeon: [name] Lerner, MD;  [facility];  Service: Orthopedics;  Laterality: Left;    Family Psychiatric History: I have reviewed family psychiatric history from progress note on 03/14/2023.  Family History:  Family History  Problem Relation Age of Onset   Diabetes Mother    Hypertension Mother    Hyperlipidemia Mother    Stroke Mother    Depression Mother    Eating disorder Mother    Hypertension Father    Hypertension Sister    Hypertension Sister    Cancer Maternal Aunt        breast ca   Breast cancer Maternal Aunt    Seizures Maternal Uncle    Heart attack Maternal Grandfather 87   Heart attack Maternal Grandmother 54   Bipolar disorder Nephew    Alcohol abuse Neg Hx    Anxiety disorder Neg Hx    Drug abuse Neg Hx    Schizophrenia Neg Hx    Colon cancer Neg Hx    Esophageal cancer Neg Hx    Stomach cancer Neg Hx    Rectal cancer Neg Hx     Social History: I have reviewed social history from progress note on 03/14/2023. Social History   Socioeconomic History   Marital status: Single    Spouse name: Not on file   Number of children: 0   Years of education: college   Highest education level: Not on file  Occupational History   Occupation: Retired  Tobacco Use   Smoking status: Every Day    Current packs/day: 0.50    Average packs/day: 0.5 packs/day for 25.0 years (12.5 ttl pk-yrs)    Types: Cigarettes   Smokeless tobacco: Never   Tobacco comments:    Slowly working thru it on her own  Vaping Use   Vaping status: Never Used  Substance and Sexual Activity   Alcohol use: Yes    Comment: 2-3 times a month   Drug use: No   Sexual activity: Not Currently  Other Topics Concern   Not on file  Social History Narrative   Pt is living in [place] with mother. Born and raised in [place] by parents. Childhood was fine. Pt has 3 older sisters. Pt has a law degree. 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 Pt is currently on disability  for mental health issues. Single, no kids.    Social Drivers of Corporate investment banker Strain: Not on file  Food Insecurity: No Food Insecurity (07/04/2023)   Hunger Vital Sign    Worried About Running Out of Food in the Last Year: Never true    Ran Out of Food in the Last Year: Never true  Transportation Needs: No Transportation Needs (07/04/2023)   PRAPARE - Administrator, Civil Service (Medical): No    Lack of Transportation (Non-Medical): No  Physical Activity: Not on file  Stress: Not on file  Social Connections: Not on file  Allergies:  Allergies  Allergen Reactions   Ace Inhibitors Anaphylaxis   Arava [Leflunomide] Other (See Comments)    Nephritis and kidney clearance issues   Travatan [Travoprost] Other (See Comments)    Red eyes, irritation    Methotrexate  Other (See Comments)    Renal issues    Metabolic Disorder Labs: Lab Results  Component Value Date   HGBA1C 5.3 06/23/2023   MPG 105.41 06/23/2023   No results found for: PROLACTIN Lab Results  Component Value Date   CHOL 177 08/08/2020   TRIG 94 08/08/2020   HDL 63 08/08/2020   CHOLHDL 2.4 01/02/2020   LDLCALC 95 08/08/2020   LDLCALC 90 01/02/2020   Lab Results  Component Value Date   TSH 1.13 08/08/2020   TSH 0.783 01/02/2020    Therapeutic Level Labs: No results found for: LITHIUM No results found for: VALPROATE No results found for: CBMZ  Current Medications: Current Outpatient Medications  Medication Sig Dispense Refill   amLODipine  (NORVASC ) 5 MG tablet Take 5 mg by mouth every morning.      ARIPiprazole  (ABILIFY ) 30 MG tablet Take 1 tablet (30 mg total) by mouth daily. 90 tablet 1   aspirin  EC 81 MG tablet Take 162 mg by mouth in the morning. Swallow whole.     atorvastatin  (LIPITOR) 40 MG tablet Take 1 tablet (40 mg total) by mouth daily. 90 tablet 3   buPROPion  (WELLBUTRIN  XL) 150 MG 24 hr tablet Take 3 tablets (450 mg total) by mouth daily. 270 tablet 1    Calcium  Carb-Cholecalciferol (CALCIUM  + D3 PO) Take 1 tablet by mouth in the morning.     Coenzyme Q10 (COQ-10 PO) Take 300 mg by mouth in the morning.     DULoxetine  (CYMBALTA ) 30 MG capsule Take 3 capsules (90 mg total) by mouth daily. 270 capsule 1   esomeprazole (NEXIUM) 40 MG capsule Take 40 mg by mouth daily before breakfast.     furosemide (LASIX) 40 MG tablet Take 40 mg by mouth in the morning.     GLUCOSAMINE-CHONDROITIN PO Take 1 tablet by mouth every morning.     inFLIXimab-dyyb 3 mg/kg in sodium chloride  0.9 % Inject 3 mg/kg into the vein every 8 (eight) weeks.     Krill Oil 300 MG CAPS Take 300 mg by mouth in the morning.     levobunolol  (BETAGAN ) 0.5 % ophthalmic solution Place 1 drop into both eyes every morning.      Multiple Vitamin (MULTIVITAMIN WITH MINERALS) TABS tablet Take 1 tablet by mouth in the morning.     NIFEdipine  (PROCARDIA  XL/NIFEDICAL XL) 60 MG 24 hr tablet Take 60 mg by mouth every morning.     oxyCODONE -acetaminophen  (PERCOCET/ROXICET) 5-325 MG tablet Take 1 tablet by mouth 3 (three) times daily as needed for severe pain (pain score 7-10) (pain.).     PRESCRIPTION MEDICATION Inhale 1 application into the lungs at bedtime. BiPAP setting 8/12     QUEtiapine  (SEROQUEL ) 300 MG tablet Take 2 tablets (600 mg total) by mouth at bedtime. 180 tablet 1   tirzepatide  (MOUNJARO ) 15 MG/0.5ML Pen Inject 15 mg into the skin once a week. 6 mL 1   tirzepatide  (MOUNJARO ) 15 MG/0.5ML Pen Inject 15 mg into the skin once a week. 6 mL 1   Turmeric POWD Take 1,500 mg by mouth in the morning.     verapamil  (CALAN -SR) 180 MG CR tablet Take 1 tablet (180 mg total) by mouth daily. 30 tablet 1   No current  facility-administered medications for this visit.     Musculoskeletal: Strength & Muscle Tone: within normal limits Gait & Station: normal Patient leans: N/A  Psychiatric Specialty Exam: Review of Systems  Psychiatric/Behavioral: Negative.      Blood pressure 126/80, pulse 72,  temperature (!) 97.2 F (36.2 C), temperature source Temporal, height 5' 9 (1.753 m), weight 228 lb 9.6 oz (103.7 kg).Body mass index is 33.76 kg/m.  General Appearance: Casual  Eye Contact:  Fair  Speech:  Clear and Coherent  Volume:  Normal  Mood:  Euthymic  Affect:  Congruent  Thought Process:  Goal Directed and Descriptions of Associations: Intact  Orientation:  Full (Time, Place, and Person)  Thought Content: Logical   Suicidal Thoughts:  No  Homicidal Thoughts:  No  Memory:  Immediate;   Fair Recent;   Fair Remote;   Fair  Judgement:  Fair  Insight:  Fair  Psychomotor Activity:  Normal  Concentration:  Concentration: Fair and Attention Span: Fair  Recall:  [employer] of Knowledge: Fair  Language: Fair  Akathisia:  No  Handed:  Right  AIMS (if indicated): done  Assets:  Communication Skills Desire for Improvement Housing Social Support Transportation  ADL's:  Intact  Cognition: WNL  Sleep:  Fair   Screenings: AIMS    Flowsheet Row Office Visit from 03/14/2023 in [facility]  AIMS Total Score 0   GAD-7    Flowsheet Row Office Visit from 03/14/2023 in [facility]  Total GAD-7 Score 0   PHQ2-9    Flowsheet Row Office Visit from 01/26/2024 in [facility] Office Visit from 03/14/2023 in [facility] Video Visit from 09[facility]09/2022 in [facility] Video Visit from [facility]13/2022 [facility] Video Visit from [facility]/19[facility]2021 [facility]  PHQ-2 Total Score 0 0 0 0 0  PHQ-9 Total Score -- 0 -- -- --   Flowsheet Row Office Visit from 01/26/2024 in [facility] Video Visit from 09/16/2023 in [facility] Admission (Discharged)  from 07/04/2023 in [facility]  C-SSRS RISK CATEGORY Moderate Risk No Risk No Risk     Assessment and Plan: Krystian Denise [name] is a 63 year old African-American female on disability, has a history of schizoaffective disorder, OCD, was evaluated in office today, discussed assessment and plan as noted below.  1. Schizoaffective disorder, depressive type (HCC)-stable Currently denies any significant mood symptoms. Continue Seroquel  600 mg at bedtime Continue Abilify  20 mg daily Continue Wellbutrin  XL 450 mg daily Patient aware of long-term risk of being on 2 antipsychotic medications, not interested in tapering off to monotherapy  2. Obsessive-compulsive disorder with good or fair insight-stable Currently denies any obsessions or compulsive behaviors Continue Cymbalta  90 mg daily  3. Tobacco use disorder-unstable Not interested in quitting at this time.   Patient advised to sign a consent to obtain most recent labs from primary care provider patient will benefit from repeat TSH, lipid panel.  Follow-up Follow-up in clinic in 4 months or sooner if needed.   Collaboration of Care: Collaboration of Care: Other patient encouraged to sign an ROI to obtain most recent labs from primary care provider.  Patient/Guardian was advised Release of Information must be obtained prior to any record release in order to collaborate their care with an outside provider. Patient/Guardian was advised if they have not  already done so to contact the registration department to sign all necessary forms in order for us  to release information regarding their care.   Consent: Patient/Guardian gives verbal consent for treatment and assignment of benefits for services provided during this visit. Patient/Guardian expressed understanding and agreed to proceed.   This note was generated in part or whole with voice recognition software. Voice recognition is usually quite accurate but there are  transcription errors that can and very often do occur. I apologize for any typographical errors that were not detected and corrected.    Lataja Newland, MD 01/26/2024, 11:32 AM

## 2024-01-30 ENCOUNTER — Ambulatory Visit (HOSPITAL_COMMUNITY): Payer: Self-pay | Admitting: Medical

## 2024-01-30 ENCOUNTER — Other Ambulatory Visit: Payer: Self-pay

## 2024-01-30 ENCOUNTER — Ambulatory Visit (HOSPITAL_COMMUNITY): Admitting: Anesthesiology

## 2024-01-30 ENCOUNTER — Observation Stay (HOSPITAL_COMMUNITY)
Admission: RE | Admit: 2024-01-30 | Discharge: 2024-02-01 | Disposition: A | Discharge status: Home / Self Care | Attending: Orthopedic Surgery | Admitting: Orthopedic Surgery

## 2024-01-30 ENCOUNTER — Encounter (HOSPITAL_COMMUNITY)
Admission: RE | Disposition: A | Discharge status: Home / Self Care | Payer: Self-pay | Source: Home / Self Care | Attending: Orthopedic Surgery

## 2024-01-30 ENCOUNTER — Encounter (HOSPITAL_COMMUNITY): Payer: Self-pay | Admitting: Orthopedic Surgery

## 2024-01-30 DIAGNOSIS — N1831 Chronic kidney disease, stage 3a: Secondary | ICD-10-CM | POA: Insufficient documentation

## 2024-01-30 DIAGNOSIS — M1712 Unilateral primary osteoarthritis, left knee: Secondary | ICD-10-CM | POA: Diagnosis not present

## 2024-01-30 DIAGNOSIS — F1090 Alcohol use, unspecified, uncomplicated: Secondary | ICD-10-CM | POA: Diagnosis not present

## 2024-01-30 DIAGNOSIS — F17210 Nicotine dependence, cigarettes, uncomplicated: Secondary | ICD-10-CM | POA: Insufficient documentation

## 2024-01-30 DIAGNOSIS — Z7982 Long term (current) use of aspirin: Secondary | ICD-10-CM | POA: Insufficient documentation

## 2024-01-30 DIAGNOSIS — Z79899 Other long term (current) drug therapy: Secondary | ICD-10-CM | POA: Diagnosis not present

## 2024-01-30 DIAGNOSIS — E1122 Type 2 diabetes mellitus with diabetic chronic kidney disease: Secondary | ICD-10-CM | POA: Insufficient documentation

## 2024-01-30 DIAGNOSIS — Z794 Long term (current) use of insulin: Secondary | ICD-10-CM | POA: Insufficient documentation

## 2024-01-30 DIAGNOSIS — I129 Hypertensive chronic kidney disease with stage 1 through stage 4 chronic kidney disease, or unspecified chronic kidney disease: Secondary | ICD-10-CM | POA: Diagnosis not present

## 2024-01-30 DIAGNOSIS — M179 Osteoarthritis of knee, unspecified: Principal | ICD-10-CM | POA: Diagnosis present

## 2024-01-30 HISTORY — PX: TOTAL KNEE ARTHROPLASTY: SHX125

## 2024-01-30 LAB — GLUCOSE, CAPILLARY
Glucose-Capillary: 86 mg/dL (ref 70–99)
Glucose-Capillary: 76 mg/dL (ref 70–99)

## 2024-01-30 SURGERY — ARTHROPLASTY, KNEE, TOTAL
Anesthesia: Monitor Anesthesia Care | Site: Knee | Laterality: Left

## 2024-01-30 MED ORDER — TRANEXAMIC ACID 1000 MG/10ML IV SOLN
2000.0000 mg | Freq: Once | INTRAVENOUS | Status: DC
  Filled 2024-01-30: qty 20

## 2024-01-30 MED ORDER — CEFAZOLIN SODIUM-DEXTROSE 2-4 GM/100ML-% IV SOLN
2.0000 g | Freq: Four times a day (QID) | INTRAVENOUS | Status: AC
  Administered 2024-01-30 (×2): 2 g via INTRAVENOUS
  Filled 2024-01-30 (×2): qty 100

## 2024-01-30 MED ORDER — FENTANYL CITRATE (PF) 50 MCG/ML IJ SOSY
100.0000 ug | PREFILLED_SYRINGE | INTRAMUSCULAR | Status: DC
  Filled 2024-01-30: qty 2

## 2024-01-30 MED ORDER — ACETAMINOPHEN 500 MG PO TABS
1000.0000 mg | ORAL_TABLET | Freq: Four times a day (QID) | ORAL | Status: AC
  Administered 2024-01-30 – 2024-01-31 (×3): 1000 mg via ORAL
  Filled 2024-01-30 (×4): qty 2

## 2024-01-30 MED ORDER — EPHEDRINE 5 MG/ML INJ
INTRAVENOUS | Status: AC
  Filled 2024-01-30: qty 5

## 2024-01-30 MED ORDER — STERILE WATER FOR IRRIGATION IR SOLN
Status: DC | PRN
  Administered 2024-01-30: 2000 mL

## 2024-01-30 MED ORDER — SODIUM CHLORIDE (PF) 0.9 % IJ SOLN
INTRAMUSCULAR | Status: DC | PRN
  Administered 2024-01-30: 80 mL

## 2024-01-30 MED ORDER — BUPIVACAINE LIPOSOME 1.3 % IJ SUSP: 20.0000 mL | Freq: Once | INTRAMUSCULAR | Status: DC

## 2024-01-30 MED ORDER — FENTANYL CITRATE (PF) 50 MCG/ML IJ SOSY: 25.0000 ug | PREFILLED_SYRINGE | INTRAMUSCULAR | Status: DC | PRN

## 2024-01-30 MED ORDER — SODIUM CHLORIDE (PF) 0.9 % IJ SOLN
INTRAMUSCULAR | Status: AC
Start: 2024-01-30 — End: 2024-01-30
  Filled 2024-01-30: qty 50

## 2024-01-30 MED ORDER — METHOCARBAMOL 500 MG PO TABS
500.0000 mg | ORAL_TABLET | Freq: Four times a day (QID) | ORAL | Status: DC | PRN
Start: 2024-01-30 — End: 2024-02-01
  Administered 2024-01-30 – 2024-02-01 (×4): 500 mg via ORAL
  Filled 2024-01-30 (×4): qty 1

## 2024-01-30 MED ORDER — DEXAMETHASONE SOD PHOSPHATE PF 10 MG/ML IJ SOLN
8.0000 mg | Freq: Once | INTRAMUSCULAR | Status: AC
  Administered 2024-01-30: 8 mg via INTRAVENOUS

## 2024-01-30 MED ORDER — CEFAZOLIN SODIUM-DEXTROSE 2-4 GM/100ML-% IV SOLN
2.0000 g | INTRAVENOUS | Status: AC
  Administered 2024-01-30: 2 g via INTRAVENOUS
  Filled 2024-01-30: qty 100

## 2024-01-30 MED ORDER — PHENOL 1.4 % MT LIQD: 1.0000 | OROMUCOSAL | Status: DC | PRN

## 2024-01-30 MED ORDER — LACTATED RINGERS IV SOLN: INTRAVENOUS | Status: DC

## 2024-01-30 MED ORDER — MENTHOL 3 MG MT LOZG
1.0000 | LOZENGE | OROMUCOSAL | Status: DC | PRN
  Administered 2024-01-30: 3 mg via ORAL
  Filled 2024-01-30: qty 9

## 2024-01-30 MED ORDER — ARIPIPRAZOLE 10 MG PO TABS
30.0000 mg | ORAL_TABLET | Freq: Every day | ORAL | Status: DC
  Administered 2024-01-30 – 2024-02-01 (×3): 30 mg via ORAL
  Filled 2024-01-30 (×3): qty 3

## 2024-01-30 MED ORDER — VASOPRESSIN 20 UNIT/ML IV SOLN
INTRAVENOUS | Status: AC
  Filled 2024-01-30: qty 1

## 2024-01-30 MED ORDER — OXYCODONE HCL 5 MG/5ML PO SOLN: 5.0000 mg | Freq: Once | ORAL | Status: DC | PRN

## 2024-01-30 MED ORDER — METOCLOPRAMIDE HCL 5 MG PO TABS: 5.0000 mg | ORAL_TABLET | Freq: Three times a day (TID) | ORAL | Status: DC | PRN

## 2024-01-30 MED ORDER — DULOXETINE HCL 60 MG PO CPEP
90.0000 mg | ORAL_CAPSULE | Freq: Every day | ORAL | Status: DC
  Administered 2024-01-31 – 2024-02-01 (×2): 90 mg via ORAL
  Filled 2024-01-30 (×2): qty 1

## 2024-01-30 MED ORDER — ACETAMINOPHEN 10 MG/ML IV SOLN
1000.0000 mg | Freq: Four times a day (QID) | INTRAVENOUS | Status: DC
  Administered 2024-01-30: 1000 mg via INTRAVENOUS
  Filled 2024-01-30: qty 100

## 2024-01-30 MED ORDER — CHLORHEXIDINE GLUCONATE 0.12 % MT SOLN
15.0000 mL | Freq: Once | OROMUCOSAL | Status: AC
  Administered 2024-01-30: 15 mL via OROMUCOSAL

## 2024-01-30 MED ORDER — ATORVASTATIN CALCIUM 40 MG PO TABS
40.0000 mg | ORAL_TABLET | Freq: Every day | ORAL | Status: DC
  Administered 2024-01-31 – 2024-02-01 (×2): 40 mg via ORAL
  Filled 2024-01-30 (×3): qty 1

## 2024-01-30 MED ORDER — HYDROMORPHONE HCL 1 MG/ML IJ SOLN
0.5000 mg | INTRAMUSCULAR | Status: DC | PRN
  Administered 2024-02-01: 0.5 mg via INTRAVENOUS
  Filled 2024-01-30: qty 1

## 2024-01-30 MED ORDER — ONDANSETRON HCL 4 MG PO TABS: 4.0000 mg | ORAL_TABLET | Freq: Four times a day (QID) | ORAL | Status: DC | PRN

## 2024-01-30 MED ORDER — METOCLOPRAMIDE HCL 5 MG/ML IJ SOLN: 5.0000 mg | Freq: Three times a day (TID) | INTRAMUSCULAR | Status: DC | PRN

## 2024-01-30 MED ORDER — TRANEXAMIC ACID-NACL 1000-0.7 MG/100ML-% IV SOLN
INTRAVENOUS | Status: AC
  Filled 2024-01-30: qty 100

## 2024-01-30 MED ORDER — OXYCODONE HCL 5 MG PO TABS: 5.0000 mg | ORAL_TABLET | Freq: Once | ORAL | Status: DC | PRN

## 2024-01-30 MED ORDER — METHOCARBAMOL 1000 MG/10ML IJ SOLN
500.0000 mg | Freq: Four times a day (QID) | INTRAMUSCULAR | Status: DC | PRN
Start: 2024-01-30 — End: 2024-02-01

## 2024-01-30 MED ORDER — PROPOFOL 500 MG/50ML IV EMUL
INTRAVENOUS | Status: DC | PRN
  Administered 2024-01-30: 100 mg via INTRAVENOUS
  Administered 2024-01-30: 130 ug/kg/min via INTRAVENOUS

## 2024-01-30 MED ORDER — 0.9 % SODIUM CHLORIDE (POUR BTL) OPTIME
TOPICAL | Status: DC | PRN
  Administered 2024-01-30: 1000 mL

## 2024-01-30 MED ORDER — PROPOFOL 500 MG/50ML IV EMUL
INTRAVENOUS | Status: AC
  Filled 2024-01-30: qty 50

## 2024-01-30 MED ORDER — VERAPAMIL HCL ER 180 MG PO TBCR
180.0000 mg | EXTENDED_RELEASE_TABLET | Freq: Every day | ORAL | Status: DC
  Administered 2024-01-31 – 2024-02-01 (×2): 180 mg via ORAL
  Filled 2024-01-30 (×2): qty 1

## 2024-01-30 MED ORDER — EPHEDRINE SULFATE (PRESSORS) 50 MG/ML IJ SOLN
INTRAMUSCULAR | Status: DC | PRN
  Administered 2024-01-30: 5 mg via INTRAVENOUS
  Administered 2024-01-30: 10 mg via INTRAVENOUS
  Administered 2024-01-30: 7.5 mg via INTRAVENOUS
  Administered 2024-01-30: 10 mg via INTRAVENOUS

## 2024-01-30 MED ORDER — POLYETHYLENE GLYCOL 3350 17 G PO PACK: 17.0000 g | PACK | Freq: Every day | ORAL | Status: DC | PRN

## 2024-01-30 MED ORDER — RIVAROXABAN 10 MG PO TABS
10.0000 mg | ORAL_TABLET | Freq: Every day | ORAL | Status: DC
  Administered 2024-01-31 – 2024-02-01 (×2): 10 mg via ORAL
  Filled 2024-01-30 (×2): qty 1

## 2024-01-30 MED ORDER — ORAL CARE MOUTH RINSE: 15.0000 mL | Freq: Once | OROMUCOSAL | Status: AC

## 2024-01-30 MED ORDER — GLYCOPYRROLATE 0.2 MG/ML IJ SOLN
INTRAMUSCULAR | Status: DC | PRN
  Administered 2024-01-30: .2 mg via INTRAVENOUS

## 2024-01-30 MED ORDER — PROPOFOL 1000 MG/100ML IV EMUL
INTRAVENOUS | Status: AC
  Filled 2024-01-30: qty 100

## 2024-01-30 MED ORDER — AMISULPRIDE (ANTIEMETIC) 5 MG/2ML IV SOLN: 10.0000 mg | Freq: Once | INTRAVENOUS | Status: DC | PRN

## 2024-01-30 MED ORDER — PANTOPRAZOLE SODIUM 40 MG PO TBEC
80.0000 mg | DELAYED_RELEASE_TABLET | Freq: Every day | ORAL | Status: DC
  Administered 2024-01-31 – 2024-02-01 (×2): 80 mg via ORAL
  Filled 2024-01-30 (×2): qty 2

## 2024-01-30 MED ORDER — NIFEDIPINE ER OSMOTIC RELEASE 60 MG PO TB24
60.0000 mg | ORAL_TABLET | Freq: Every morning | ORAL | Status: DC
  Administered 2024-01-31 – 2024-02-01 (×2): 60 mg via ORAL
  Filled 2024-01-30 (×2): qty 1

## 2024-01-30 MED ORDER — MIDAZOLAM HCL 2 MG/2ML IJ SOLN
2.0000 mg | INTRAMUSCULAR | Status: AC
  Administered 2024-01-30: 2 mg via INTRAVENOUS
  Filled 2024-01-30: qty 2

## 2024-01-30 MED ORDER — ROPIVACAINE HCL 5 MG/ML IJ SOLN
INTRAMUSCULAR | Status: DC | PRN
  Administered 2024-01-30: 20 mL via PERINEURAL

## 2024-01-30 MED ORDER — DIPHENHYDRAMINE HCL 12.5 MG/5ML PO ELIX: 12.5000 mg | ORAL_SOLUTION | ORAL | Status: DC | PRN

## 2024-01-30 MED ORDER — OXYCODONE HCL 5 MG PO TABS
5.0000 mg | ORAL_TABLET | ORAL | Status: DC | PRN
  Administered 2024-01-30 – 2024-01-31 (×3): 5 mg via ORAL
  Administered 2024-01-31 – 2024-02-01 (×6): 10 mg via ORAL
  Filled 2024-01-30: qty 1
  Filled 2024-01-30 (×6): qty 2
  Filled 2024-01-30 (×2): qty 1
  Filled 2024-01-30: qty 2

## 2024-01-30 MED ORDER — SODIUM CHLORIDE 0.9 % IR SOLN
Status: DC | PRN
  Administered 2024-01-30: 1000 mL

## 2024-01-30 MED ORDER — FUROSEMIDE 40 MG PO TABS
40.0000 mg | ORAL_TABLET | Freq: Every morning | ORAL | Status: DC
  Administered 2024-01-31 – 2024-02-01 (×2): 40 mg via ORAL
  Filled 2024-01-30 (×2): qty 1

## 2024-01-30 MED ORDER — AMLODIPINE BESYLATE 5 MG PO TABS
5.0000 mg | ORAL_TABLET | Freq: Every morning | ORAL | Status: DC
  Administered 2024-01-31 – 2024-02-01 (×2): 5 mg via ORAL
  Filled 2024-01-30 (×2): qty 1

## 2024-01-30 MED ORDER — DOCUSATE SODIUM 100 MG PO CAPS
100.0000 mg | ORAL_CAPSULE | Freq: Two times a day (BID) | ORAL | Status: DC
  Administered 2024-01-30 – 2024-02-01 (×4): 100 mg via ORAL
  Filled 2024-01-30 (×4): qty 1

## 2024-01-30 MED ORDER — BUPIVACAINE LIPOSOME 1.3 % IJ SUSP
INTRAMUSCULAR | Status: AC
  Filled 2024-01-30: qty 20

## 2024-01-30 MED ORDER — BUPROPION HCL ER (XL) 300 MG PO TB24
450.0000 mg | ORAL_TABLET | Freq: Every day | ORAL | Status: DC
  Administered 2024-01-31 – 2024-02-01 (×2): 450 mg via ORAL
  Filled 2024-01-30 (×2): qty 1

## 2024-01-30 MED ORDER — OXYCODONE-ACETAMINOPHEN 5-325 MG PO TABS
1.0000 | ORAL_TABLET | Freq: Three times a day (TID) | ORAL | Status: DC | PRN
  Administered 2024-01-30 – 2024-02-01 (×3): 1 via ORAL
  Filled 2024-01-30 (×3): qty 1

## 2024-01-30 MED ORDER — FLEET ENEMA RE ENEM: 1.0000 | ENEMA | Freq: Once | RECTAL | Status: DC | PRN

## 2024-01-30 MED ORDER — BISACODYL 10 MG RE SUPP: 10.0000 mg | Freq: Every day | RECTAL | Status: DC | PRN

## 2024-01-30 MED ORDER — BUPIVACAINE IN DEXTROSE 0.75-8.25 % IT SOLN
INTRATHECAL | Status: DC | PRN
  Administered 2024-01-30: 1.8 mL via INTRATHECAL

## 2024-01-30 MED ORDER — SODIUM CHLORIDE 0.9 % IV SOLN: INTRAVENOUS | Status: DC

## 2024-01-30 MED ORDER — QUETIAPINE FUMARATE 300 MG PO TABS
600.0000 mg | ORAL_TABLET | Freq: Every day | ORAL | Status: DC
Start: 2024-01-31 — End: 2024-02-01
  Administered 2024-01-31: 600 mg via ORAL
  Filled 2024-01-30: qty 2

## 2024-01-30 MED ORDER — ACETAMINOPHEN 325 MG PO TABS
325.0000 mg | ORAL_TABLET | Freq: Four times a day (QID) | ORAL | Status: DC | PRN
  Administered 2024-01-31 – 2024-02-01 (×2): 650 mg via ORAL
  Filled 2024-01-30 (×2): qty 2

## 2024-01-30 MED ORDER — POVIDONE-IODINE 10 % EX SWAB: 2.0000 | Freq: Once | CUTANEOUS | Status: DC

## 2024-01-30 MED ORDER — PHENYLEPHRINE HCL (PRESSORS) 10 MG/ML IV SOLN
INTRAVENOUS | Status: DC | PRN
  Administered 2024-01-30: 80 ug via INTRAVENOUS
  Administered 2024-01-30: 160 ug via INTRAVENOUS
  Administered 2024-01-30 (×2): 40 ug via INTRAVENOUS
  Administered 2024-01-30: 160 ug via INTRAVENOUS
  Administered 2024-01-30: 120 ug via INTRAVENOUS
  Administered 2024-01-30: 80 ug via INTRAVENOUS

## 2024-01-30 MED ORDER — ONDANSETRON HCL 4 MG/2ML IJ SOLN: 4.0000 mg | Freq: Four times a day (QID) | INTRAMUSCULAR | Status: DC | PRN

## 2024-01-30 SURGICAL SUPPLY — 42 items
ATTUNE MED DOME PAT 41 KNEE (Knees) IMPLANT
ATTUNE PS FEM LT SZ 6 CEM KNEE (Femur) IMPLANT
ATTUNE PSRP INSR SZ6 8 KNEE (Insert) IMPLANT
BAG COUNTER SPONGE SURGICOUNT (BAG) IMPLANT
BAG ZIPLOCK 12X15 (MISCELLANEOUS) ×1 IMPLANT
BASE TIBIAL ROT PLAT SZ 5 KNEE (Knees) IMPLANT
BLADE SAG 18X100X1.27 (BLADE) ×1 IMPLANT
BLADE SAW SGTL 11.0X1.19X90.0M (BLADE) ×1 IMPLANT
BNDG ELASTIC 6INX 5YD STR LF (GAUZE/BANDAGES/DRESSINGS) ×1 IMPLANT
BOWL SMART MIX CTS (DISPOSABLE) ×1 IMPLANT
CEMENT HV SMART SET (Cement) ×2 IMPLANT
COVER SURGICAL LIGHT HANDLE (MISCELLANEOUS) ×1 IMPLANT
CUFF TRNQT CYL 34X4.125X (TOURNIQUET CUFF) ×1 IMPLANT
DERMABOND ADVANCED .7 DNX12 (GAUZE/BANDAGES/DRESSINGS) ×1 IMPLANT
DRAPE U-SHAPE 47X51 STRL (DRAPES) ×1 IMPLANT
DRSG AQUACEL AG ADV 3.5X10 (GAUZE/BANDAGES/DRESSINGS) ×1 IMPLANT
DURAPREP 26ML APPLICATOR (WOUND CARE) ×1 IMPLANT
ELECT REM PT RETURN 15FT ADLT (MISCELLANEOUS) ×1 IMPLANT
GLOVE BIO SURGEON STRL SZ 6.5 (GLOVE) IMPLANT
GLOVE BIO SURGEON STRL SZ8 (GLOVE) ×1 IMPLANT
GLOVE BIOGEL PI IND STRL 7.0 (GLOVE) ×1 IMPLANT
GLOVE BIOGEL PI IND STRL 8 (GLOVE) ×1 IMPLANT
GOWN STRL REUS W/ TWL LRG LVL3 (GOWN DISPOSABLE) ×1 IMPLANT
HOLDER FOLEY CATH W/STRAP (MISCELLANEOUS) ×1 IMPLANT
IMMOBILIZER KNEE 20 THIGH 36 (SOFTGOODS) ×1 IMPLANT
KIT TURNOVER KIT A (KITS) ×1 IMPLANT
MANIFOLD NEPTUNE II (INSTRUMENTS) ×1 IMPLANT
NS IRRIG 1000ML POUR BTL (IV SOLUTION) ×1 IMPLANT
PACK TOTAL KNEE CUSTOM (KITS) ×1 IMPLANT
PADDING CAST COTTON 6X4 STRL (CAST SUPPLIES) ×2 IMPLANT
PENCIL SMOKE EVACUATOR (MISCELLANEOUS) ×1 IMPLANT
PIN STEINMAN FIXATION KNEE (PIN) IMPLANT
PROTECTOR NERVE ULNAR (MISCELLANEOUS) ×1 IMPLANT
SET HNDPC FAN SPRY TIP SCT (DISPOSABLE) ×1 IMPLANT
SUT MNCRL AB 4-0 PS2 18 (SUTURE) ×1 IMPLANT
SUT VIC AB 2-0 CT1 TAPERPNT 27 (SUTURE) ×3 IMPLANT
SUTURE STRATFX 0 PDS 27 VIOLET (SUTURE) ×1 IMPLANT
TOWEL GREEN STERILE FF (TOWEL DISPOSABLE) ×1 IMPLANT
TRAY FOLEY MTR SLVR 14FR STAT (SET/KITS/TRAYS/PACK) IMPLANT
TRAY FOLEY MTR SLVR 16FR STAT (SET/KITS/TRAYS/PACK) ×1 IMPLANT
TUBE SUCTION HIGH CAP CLEAR NV (SUCTIONS) ×1 IMPLANT
WRAP KNEE MAXI GEL POST OP (GAUZE/BANDAGES/DRESSINGS) ×1 IMPLANT

## 2024-01-30 NOTE — Discharge Instructions (Addendum)
 Jeanne Moan, MD Total Joint Specialist [facility] [address], [address] [place], [place] [address] [phone]  TOTAL KNEE REPLACEMENT POSTOPERATIVE DIRECTIONS    Knee Rehabilitation, Guidelines Following Surgery  Results after knee surgery are often greatly improved when you follow the exercise, range of motion and muscle strengthening exercises prescribed by your doctor. Safety measures are also important to protect the knee from further injury. If any of these exercises cause you to have increased pain or swelling in your knee joint, decrease the amount until you are comfortable again and slowly increase them. If you have problems or questions, call your caregiver or physical therapist for advice.   BLOOD CLOT PREVENTION Take a 10 mg Xarelto once a day for three weeks following surgery. Then resume two 81 mg aspirin  tablets once a day. You may resume your vitamins/supplements once you have discontinued the Xarelto. Do not take any NSAIDs (Advil , Aleve, Ibuprofen , Meloxicam, etc.) until you have discontinued the Xarelto.   HOME CARE INSTRUCTIONS  Remove items at home which could result in a fall. This includes throw rugs or furniture in walking pathways.  ICE to the affected knee as much as tolerated. Icing helps control swelling. If the swelling is well controlled you will be more comfortable and rehab easier. Continue to use ice on the knee for pain and swelling from surgery. You may notice swelling that will progress down to the foot and ankle. This is normal after surgery. Elevate the leg when you are not up walking on it.    Continue to use the breathing machine which will help keep your temperature down. It is common for your temperature to cycle up and down following surgery, especially at night when you are not up moving around and exerting yourself. The breathing machine keeps your lungs expanded and your temperature down. Do not place pillow under the  operative knee, focus on keeping the knee straight while resting  DIET You may resume your previous home diet once you are discharged from the hospital.  DRESSING / WOUND CARE / SHOWERING Keep your bulky bandage on for 2 days. On the third post-operative day you may remove the Ace bandage and gauze. There is a waterproof adhesive bandage on your skin which will stay in place until your first follow-up appointment. Once you remove this you will not need to place another bandage You may begin showering 3 days following surgery, but do not submerge the incision under water .  ACTIVITY For the first 5 days, the key is rest and control of pain and swelling Do your home exercises twice a day starting on post-operative day 3. On the days you go to physical therapy, just do the home exercises once that day. You should rest, ice and elevate the leg for 50 minutes out of every hour. Get up and walk/stretch for 10 minutes per hour. After 5 days you can increase your activity slowly as tolerated. Walk with your walker as instructed. Use the walker until you are comfortable transitioning to a cane. Walk with the cane in the opposite hand of the operative leg. You may discontinue the cane once you are comfortable and walking steadily. Avoid periods of inactivity such as sitting longer than an hour when not asleep. This helps prevent blood clots.  You may discontinue the knee immobilizer once you are able to perform a straight leg raise while lying down. You may resume a sexual relationship in one month or when given the OK by  your doctor.  You may return to work once you are cleared by your doctor.  Do not drive a car for 6 weeks or until released by your surgeon.  Do not drive while taking narcotics.  TED HOSE STOCKINGS Wear the elastic stockings on both legs for three weeks following surgery during the day. You may remove them at night for sleeping.  WEIGHT BEARING Weight bearing as tolerated with assist  device (walker, cane, etc) as directed, use it as long as suggested by your surgeon or therapist, typically at least 4-6 weeks.  POSTOPERATIVE CONSTIPATION PROTOCOL Constipation - defined medically as fewer than three stools per week and severe constipation as less than one stool per week.  One of the most common issues patients have following surgery is constipation.  Even if you have a regular bowel pattern at home, your normal regimen is likely to be disrupted due to multiple reasons following surgery.  Combination of anesthesia, postoperative narcotics, change in appetite and fluid intake all can affect your bowels.  In order to avoid complications following surgery, here are some recommendations in order to help you during your recovery period.  Colace (docusate) - Pick up an over-the-counter form of Colace or another stool softener and take twice a day as long as you are requiring postoperative pain medications.  Take with a full glass of water  daily.  If you experience loose stools or diarrhea, hold the colace until you stool forms back up. If your symptoms do not get better within 1 week or if they get worse, check with your doctor. Dulcolax (bisacodyl ) - Pick up over-the-counter and take as directed by the product packaging as needed to assist with the movement of your bowels.  Take with a full glass of water .  Use this product as needed if not relieved by Colace only.  MiraLax  (polyethylene glycol) - Pick up over-the-counter to have on hand. MiraLax  is a solution that will increase the amount of water  in your bowels to assist with bowel movements.  Take as directed and can mix with a glass of water , juice, soda, coffee, or tea. Take if you go more than two days without a movement. Do not use MiraLax  more than once per day. Call your doctor if you are still constipated or irregular after using this medication for 7 days in a row.  If you continue to have problems with postoperative constipation,  please contact the office for further assistance and recommendations.  If you experience the worst abdominal pain ever or develop nausea or vomiting, please contact the office immediatly for further recommendations for treatment.  ITCHING If you experience itching with your medications, try taking only a single pain pill, or even half a pain pill at a time.  You can also use Benadryl  over the counter for itching or also to help with sleep.   MEDICATIONS See your medication summary on the "After Visit Summary" that the nursing staff will review with you prior to discharge.  You may have some home medications which will be placed on hold until you complete the course of blood thinner medication.  It is important for you to complete the blood thinner medication as prescribed by your surgeon.  Continue your approved medications as instructed at time of discharge.  PRECAUTIONS If you experience chest pain or shortness of breath - call 911 immediately for transfer to the hospital emergency department.  If you develop a fever greater that 101 F, purulent drainage from wound, increased redness  or drainage from wound, foul odor from the wound/dressing, or calf pain - CONTACT YOUR SURGEON.                                                   FOLLOW-UP APPOINTMENTS Make sure you keep all of your appointments after your operation with your surgeon and caregivers. You should call the office at the above phone number and make an appointment for approximately two weeks after the date of your surgery or on the date instructed by your surgeon outlined in the After Visit Summary.  RANGE OF MOTION AND STRENGTHENING EXERCISES  Rehabilitation of the knee is important following a knee injury or an operation. After just a few days of immobilization, the muscles of the thigh which control the knee become weakened and shrink (atrophy). Knee exercises are designed to build up the tone and strength of the thigh muscles and to  improve knee motion. Often times heat used for twenty to thirty minutes before working out will loosen up your tissues and help with improving the range of motion but do not use heat for the first two weeks following surgery. These exercises can be done on a training (exercise) mat, on the floor, on a table or on a bed. Use what ever works the best and is most comfortable for you Knee exercises include:  Leg Lifts - While your knee is still immobilized in a splint or cast, you can do straight leg raises. Lift the leg to 60 degrees, hold for 3 sec, and slowly lower the leg. Repeat 10-20 times 2-3 times daily. Perform this exercise against resistance later as your knee gets better.  Quad and Hamstring Sets - Tighten up the muscle on the front of the thigh (Quad) and hold for 5-10 sec. Repeat this 10-20 times hourly. Hamstring sets are done by pushing the foot backward against an object and holding for 5-10 sec. Repeat as with quad sets.  Leg Slides: Lying on your back, slowly slide your foot toward your buttocks, bending your knee up off the floor (only go as far as is comfortable). Then slowly slide your foot back down until your leg is flat on the floor again. Angel Wings: Lying on your back spread your legs to the side as far apart as you can without causing discomfort.  A rehabilitation program following serious knee injuries can speed recovery and prevent re-injury in the future due to weakened muscles. Contact your doctor or a physical therapist for more information on knee rehabilitation.   POST-OPERATIVE OPIOID TAPER INSTRUCTIONS: It is important to wean off of your opioid medication as soon as possible. If you do not need pain medication after your surgery it is ok to stop day one. Opioids include: Codeine, Hydrocodone (Norco, Vicodin), Oxycodone (Percocet, oxycontin ) and hydromorphone  amongst others.  Long term and even short term use of opiods can cause: Increased pain  response Dependence Constipation Depression Respiratory depression And more.  Withdrawal symptoms can include Flu like symptoms Nausea, vomiting And more Techniques to manage these symptoms Hydrate well Eat regular healthy meals Stay active Use relaxation techniques(deep breathing, meditating, yoga) Do Not substitute Alcohol to help with tapering If you have been on opioids for less than two weeks and do not have pain than it is ok to stop all together.  Plan to wean off of opioids This  plan should start within one week post op of your joint replacement. Maintain the same interval or time between taking each dose and first decrease the dose.  Cut the total daily intake of opioids by one tablet each day Next start to increase the time between doses. The last dose that should be eliminated is the evening dose.   IF YOU ARE TRANSFERRED TO A SKILLED REHAB FACILITY If the patient is transferred to a skilled rehab facility following release from the hospital, a list of the current medications will be sent to the facility for the patient to continue.  When discharged from the skilled rehab facility, please have the facility set up the patient's Home Health Physical Therapy prior to being released. Also, the skilled facility will be responsible for providing the patient with their medications at time of release from the facility to include their pain medication, the muscle relaxants, and their blood thinner medication. If the patient is still at the rehab facility at time of the two week follow up appointment, the skilled rehab facility will also need to assist the patient in arranging follow up appointment in our office and any transportation needs.  MAKE SURE YOU:  Understand these instructions.  Get help right away if you are not doing well or get worse.   DENTAL ANTIBIOTICS:  In most cases prophylactic antibiotics for Dental procdeures after total joint surgery are not  necessary.  Exceptions are as follows:  1. History of prior total joint infection  2. Severely immunocompromised (Organ Transplant, cancer chemotherapy, Rheumatoid biologic meds such as Humera)  3. Poorly controlled diabetes (A1C &gt; 8.0, blood glucose over 200)  If you have one of these conditions, contact your surgeon for an antibiotic prescription, prior to your dental procedure.   Information on my medicine - XARELTO (Rivaroxaban)   Why was Xarelto prescribed for you? Xarelto was prescribed for you to reduce the risk of blood clots forming after orthopedic surgery. The medical term for these abnormal blood clots is venous thromboembolism (VTE).  What do you need to know about xarelto ? Take your Xarelto ONCE DAILY at the same time every day. You may take it either with or without food.  If you have difficulty swallowing the tablet whole, you may crush it and mix in applesauce just prior to taking your dose.  Take Xarelto exactly as prescribed by your doctor and DO NOT stop taking Xarelto without talking to the doctor who prescribed the medication.  Stopping without other VTE prevention medication to take the place of Xarelto may increase your risk of developing a clot.  After discharge, you should have regular check-up appointments with your healthcare provider that is prescribing your Xarelto.    What do you do if you miss a dose? If you miss a dose, take it as soon as you remember on the same day then continue your regularly scheduled once daily regimen the next day. Do not take two doses of Xarelto on the same day.   Important Safety Information A possible side effect of Xarelto is bleeding. You should call your healthcare provider right away if you experience any of the following: Bleeding from an injury or your nose that does not stop. Unusual colored urine (red or dark brown) or unusual colored stools (red or black). Unusual bruising for unknown reasons. A  serious fall or if you hit your head (even if there is no bleeding).  Some medicines may interact with Xarelto and might increase your risk  of bleeding while on Xarelto. To help avoid this, consult your healthcare provider or pharmacist prior to using any new prescription or non-prescription medications, including herbals, vitamins, non-steroidal anti-inflammatory drugs (NSAIDs) and supplements.  This website has more information on Xarelto: VisitDestination.com.br.    Pick up stool softner and laxative for home use following surgery while on pain medications. Do not submerge incision under water . Please use good hand washing techniques while changing dressing each day. May shower starting three days after surgery. Please use a clean towel to pat the incision dry following showers. Continue to use ice for pain and swelling after surgery. Do not use any lotions or creams on the incision until instructed by your surgeon.

## 2024-01-30 NOTE — Plan of Care (Signed)
  Problem: Education: Goal: Knowledge of General Education information will improve Description: Including pain rating scale, medication(s)/side effects and non-pharmacologic comfort measures Outcome: Progressing   Problem: Health Behavior/Discharge Planning: Goal: Ability to manage health-related needs will improve Outcome: Progressing   Problem: Clinical Measurements: Goal: Ability to maintain clinical measurements within normal limits will improve Outcome: Progressing Goal: Will remain free from infection Outcome: Progressing Goal: Diagnostic test results will improve Outcome: Progressing Goal: Respiratory complications will improve Outcome: Progressing Goal: Cardiovascular complication will be avoided Outcome: Progressing   Problem: Activity: Goal: Risk for activity intolerance will decrease Outcome: Progressing   Problem: Nutrition: Goal: Adequate nutrition will be maintained Outcome: Progressing   Problem: Pain Managment: Goal: General experience of comfort will improve and/or be controlled Outcome: Progressing   Problem: Education: Goal: Knowledge of the prescribed therapeutic regimen will improve Outcome: Progressing Goal: Individualized Educational Video(s) Outcome: Completed/Met

## 2024-01-30 NOTE — Anesthesia Postprocedure Evaluation (Signed)
 Anesthesia Post Note  Patient: Jeanne [name]  Procedure(s) Performed: ARTHROPLASTY, KNEE, TOTAL (Left: Knee)     Patient location during evaluation: PACU Anesthesia Type: MAC and Spinal Level of consciousness: awake Pain management: pain level controlled Vital Signs Assessment: post-procedure vital signs reviewed and stable Respiratory status: spontaneous breathing, respiratory function stable and nonlabored ventilation Cardiovascular status: blood pressure returned to baseline and stable Postop Assessment: no headache, no backache and no apparent nausea or vomiting Anesthetic complications: no   No notable events documented.  Last Vitals:  Vitals:   01/30/24 1215 01/30/24 1230  BP: 104/61 108/66  Pulse: (!) 50 60  Resp: 14 19  Temp:    SpO2: 99% 97%    Last Pain:  Vitals:   01/30/24 1230  TempSrc:   PainSc: 0-No pain                 Delon Aisha Arch

## 2024-01-30 NOTE — Progress Notes (Signed)
   01/30/24 2241  BiPAP/CPAP/SIPAP  BiPAP/CPAP/SIPAP Pt Type Adult  BiPAP/CPAP/SIPAP Resmed  Mask Type Prongs-short  Dentures removed? Not applicable  Mask Size Medium  IPAP 12 cmH20 (per pt)  EPAP 8 cmH2O (per pt)  Patient Home Machine No  Patient Home Mask Yes  Patient Home Tubing Yes  Auto Titrate No  Device Plugged into RED Power Outlet Yes

## 2024-01-30 NOTE — Anesthesia Procedure Notes (Addendum)
 Spinal  Patient location during procedure: OR Start time: 01/30/2024 10:10 AM End time: 01/30/2024 10:14 AM Reason for block: surgical anesthesia Staffing Performed: anesthesiologist  Anesthesiologist: Peggye Delon Brunswick, MD Performed by: Peggye Delon Brunswick, MD Authorized by: Peggye Delon Brunswick, MD   Preanesthetic Checklist Completed: patient identified, IV checked, site marked, risks and benefits discussed, surgical consent, monitors and equipment checked, pre-op evaluation and timeout performed Spinal Block Patient position: sitting Prep: DuraPrep Patient monitoring: blood pressure and continuous pulse ox Approach: midline Location: L3-4 Injection technique: single-shot Needle Needle type: Pencan  Needle gauge: 24 G Needle length: 9 cm Additional Notes Risks and benefits of neuraxial anesthesia including, but not limited to, infection, bleeding, local anesthetic toxicity, headache, hypotension, back pain, block failure, etc. were discussed with the patient. The patient expressed understanding and consented to the procedure. I confirmed that the patient has no bleeding disorders and is not taking blood thinners. I confirmed the patient's last platelet count with the nurse. Monitors were applied. A time-out was performed immediately prior to the procedure. Sterile technique was used throughout the whole procedure.   1 attempt(s)

## 2024-01-30 NOTE — Evaluation (Signed)
 Physical Therapy Evaluation Patient Details Name: Jeanne [name] MRN: [id number] DOB: 1960-11-11 Today's Date: 01/30/2024  History of Present Illness  Pt is 63 yo female s/p L TKA on 01/30/24.  Pt with hx including but not limited to OA, DVT, RA, DM2, GERD, HLD, HTN, OCD, schizoaffective disorder, sleep apnea, L THA 3/25  Clinical Impression  Pt is s/p TKA resulting in the deficits listed below (see PT Problem List). At baseline , pt is independent and lives alone.  Her sister will be staying with her one night and then in and out.  Pt has DME and 4 steps with bil rails to enter home.  Today, pt with good quad activation, ROM, and pain control. She ambulated 23' with RW and CGA.  Pt expected to progress well.  Pt will benefit from acute skilled PT to increase their independence and safety with mobility to allow discharge.          If plan is discharge home, recommend the following: A little help with walking and/or transfers;A little help with bathing/dressing/bathroom;Assistance with cooking/housework;Help with stairs or ramp for entrance   Can travel by private vehicle        Equipment Recommendations None recommended by PT  Recommendations for Other Services       Functional Status Assessment Patient has had a recent decline in their functional status and demonstrates the ability to make significant improvements in function in a reasonable and predictable amount of time.     Precautions / Restrictions Precautions Precautions: Fall;Knee Restrictions LLE Weight Bearing Per Provider Order: Weight bearing as tolerated      Mobility  Bed Mobility Overal bed mobility: Needs Assistance Bed Mobility: Supine to Sit     Supine to sit: Supervision, Used rails          Transfers Overall transfer level: Needs assistance Equipment used: Rolling walker (2 wheels) Transfers: Sit to/from Stand Sit to Stand: Contact guard assist                 Ambulation/Gait Ambulation/Gait assistance: Contact guard assist Gait Distance (Feet): 60 Feet Assistive device: Rolling walker (2 wheels) Gait Pattern/deviations: Step-to pattern, Decreased stride length, Decreased weight shift to left Gait velocity: decreased     General Gait Details: cues for sequencing and RW proximity; tolerated well  Stairs            Wheelchair Mobility     Tilt Bed    Modified Rankin (Stroke Patients Only)       Balance Overall balance assessment: Needs assistance Sitting-balance support: No upper extremity supported Sitting balance-Leahy Scale: Good     Standing balance support: Bilateral upper extremity supported, Reliant on assistive device for balance Standing balance-Leahy Scale: Poor                               Pertinent Vitals/Pain Pain Assessment Pain Assessment: No/denies pain    Home Living Family/patient expects to be discharged to:: Private residence Living Arrangements: Alone Available Help at Discharge: Family;Available PRN/intermittently (family staying one night and then in and out) Type of Home: House Home Access: Stairs to enter Entrance Stairs-Rails: Right;Left;Can reach both Entrance Stairs-Number of Steps: 4   Home Layout: One level Home Equipment: Agricultural consultant (2 wheels);BSC/3in1;Toilet riser      Prior Function Prior Level of Function : Independent/Modified Independent             Mobility Comments: Could ambulate in  community without AD ADLs Comments: independent     Extremity/Trunk Assessment   Upper Extremity Assessment Upper Extremity Assessment: Overall WFL for tasks assessed    Lower Extremity Assessment Lower Extremity Assessment: LLE deficits/detail;RLE deficits/detail RLE Deficits / Details: ROM WFL;  MMT 5/5 LLE Deficits / Details: Expected post op changes; ROM knee 5 to 90 degrees; MMT: ankle 5/5, knee and hip 3/5 not further tested    Cervical / Trunk  Assessment Cervical / Trunk Assessment: Normal  Communication        Cognition Arousal: Alert Behavior During Therapy: WFL for tasks assessed/performed   PT - Cognitive impairments: No apparent impairments                                 Cueing       General Comments General comments (skin integrity, edema, etc.): VSS on RA; educated to rest with leg straight, encouraged to do ankle pumps and quad sets tonight    Exercises     Assessment/Plan    PT Assessment Patient needs continued PT services  PT Problem List Decreased strength;Decreased mobility;Decreased range of motion;Decreased activity tolerance;Decreased balance;Decreased knowledge of use of DME;Pain       PT Treatment Interventions DME instruction;Therapeutic exercise;Gait training;Stair training;Functional mobility training;Therapeutic activities;Patient/family education;Modalities;Balance training    PT Goals (Current goals can be found in the Care Plan section)  Acute Rehab PT Goals Patient Stated Goal: return home PT Goal Formulation: With patient/family Time For Goal Achievement: 02/13/24 Potential to Achieve Goals: Good    Frequency 7X/week     Co-evaluation               AM-PAC PT 6 Clicks Mobility  Outcome Measure Help needed turning from your back to your side while in a flat bed without using bedrails?: A Little Help needed moving from lying on your back to sitting on the side of a flat bed without using bedrails?: A Little Help needed moving to and from a bed to a chair (including a wheelchair)?: A Little Help needed standing up from a chair using your arms (e.g., wheelchair or bedside chair)?: A Little Help needed to walk in hospital room?: A Little Help needed climbing 3-5 steps with a railing? : A Little 6 Click Score: 18    End of Session Equipment Utilized During Treatment: Gait belt Activity Tolerance: Patient tolerated treatment well Patient left: with chair alarm  set;in chair;with call bell/phone within reach;with SCD's reapplied Nurse Communication: Mobility status PT Visit Diagnosis: Other abnormalities of gait and mobility (R26.89);Muscle weakness (generalized) (M62.81)    Time: 8461-8397 PT Time Calculation (min) (ACUTE ONLY): 24 min   Charges:   PT Evaluation $PT Eval Low Complexity: 1 Low PT Treatments $Gait Training: 8-22 mins PT General Charges $$ ACUTE PT VISIT: 1 Visit         Jeanne, PT Acute Rehab [facility] [phone]   Jeanne [name] 01/30/2024, 4:55 PM

## 2024-01-30 NOTE — Progress Notes (Signed)
 Orthopedic Tech Progress Note Patient Details:  Minami Denise Angola 28-Dec-1960 [id number] Applied CPM per order. Will remove at 4:20pm. CPM Left Knee CPM Left Knee: On Left Knee Flexion (Degrees): 40 Left Knee Extension (Degrees): 10  Post Interventions Patient Tolerated: Well Instructions Provided: Care of device, Adjustment of device, Poper ambulation with device Ortho Devices Type of Ortho Device: CPM padding Ortho Device/Splint Location: LLE Ortho Device/Splint Interventions: Ordered, Application, Adjustment   Post Interventions Patient Tolerated: Well Instructions Provided: Care of device, Adjustment of device, Poper ambulation with device  Morna Pink 01/30/2024, 12:21 PM

## 2024-01-30 NOTE — Anesthesia Procedure Notes (Signed)
 Anesthesia Regional Block: Adductor canal block   Pre-Anesthetic Checklist: , timeout performed,  Correct Patient, Correct Site, Correct Laterality,  Correct Procedure, Correct Position, site marked,  Risks and benefits discussed,  Surgical consent,  Pre-op evaluation,  At surgeon's request and post-op pain management  Laterality: Left  Prep: chloraprep       Needles:  Injection technique: Single-shot  Needle Type: Echogenic Stimulator Needle     Needle Length: 9cm  Needle Gauge: 21     Additional Needles:   Procedures:,,,, ultrasound used (permanent image in chart),,    Narrative:  Start time: 01/30/2024 9:27 AM End time: 01/30/2024 9:31 AM Injection made incrementally with aspirations every 5 mL.  Performed by: Personally  Anesthesiologist: Peggye Delon Brunswick, MD  Additional Notes: Discussed risks and benefits of nerve block including, but not limited to, prolonged and/or permanent nerve injury involving sensory and/or motor function. Monitors were applied and a time-out was performed. The nerve and associated structures were visualized under ultrasound guidance. After negative aspiration, local anesthetic was slowly injected around the nerve. There was no evidence of high pressure during the procedure. There were no paresthesias. VSS remained stable and the patient tolerated the procedure well.

## 2024-01-30 NOTE — Op Note (Signed)
 OPERATIVE REPORT-TOTAL KNEE ARTHROPLASTY   Pre-operative diagnosis- Osteoarthritis  Left knee(s)  Post-operative diagnosis- Osteoarthritis Left knee(s)  Procedure-  Left  Total Knee Arthroplasty  Surgeon- Dempsey GAILS. Mariachristina Holle, MD  Assistant- Corean Sender, PA-C   Anesthesia-  Adductor canal block and spinal  EBL 25 ml   Drains None  Tourniquet time-  Total Tourniquet Time Documented: Thigh (Left) - 41 minutes Total: Thigh (Left) - 41 minutes     Complications- None  Condition-PACU - hemodynamically stable.   Brief Clinical Note  Jeanne [name] is a 63 y.o. year old female with end stage OA of her left knee with progressively worsening pain and dysfunction. She has constant pain, with activity and at rest and significant functional deficits with difficulties even with ADLs. She has had extensive non-op management including analgesics, injections of cortisone and viscosupplements, and home exercise program, but remains in significant pain with significant dysfunction. Radiographs show bone on bone arthritis medial and patellofemoral. She presents now for left Total Knee Arthroplasty.     Procedure in detail---   The patient is brought into the operating room and positioned supine on the operating table. After successful administration of  Adductor canal block and spinal,   a tourniquet is placed high on the  Left thigh(s) and the lower extremity is prepped and draped in the usual sterile fashion. Time out is performed by the operating team and then the  Left lower extremity is wrapped in Esmarch, knee flexed and the tourniquet inflated to 300 mmHg.       A midline incision is made with a ten blade through the subcutaneous tissue to the level of the extensor mechanism. A fresh blade is used to make a medial parapatellar arthrotomy. Soft tissue over the proximal medial tibia is subperiosteally elevated to the joint line with a knife and into the semimembranosus bursa with a  Cobb elevator. Soft tissue over the proximal lateral tibia is elevated with attention being paid to avoiding the patellar tendon on the tibial tubercle. The patella is everted, knee flexed 90 degrees and the ACL and PCL are removed. Findings are bone on bone medial and patellofemoral with large global osteophytes        The drill is used to create a starting hole in the distal femur and the canal is thoroughly irrigated with sterile saline to remove the fatty contents. The 5 degree Left  valgus alignment guide is placed into the femoral canal and the distal femoral cutting block is pinned to remove 10 mm off the distal femur. Resection is made with an oscillating saw.      The tibia is subluxed forward and the menisci are removed. The extramedullary alignment guide is placed referencing proximally at the medial aspect of the tibial tubercle and distally along the second metatarsal axis and tibial crest. The block is pinned to remove 2mm off the more deficient medial  side. Resection is made with an oscillating saw. Size 5is the most appropriate size for the tibia and the proximal tibia is prepared with the modular drill and keel punch for that size.      The femoral sizing guide is placed and size 6 is most appropriate. Rotation is marked off the epicondylar axis and confirmed by creating a rectangular flexion gap at 90 degrees. The size 6 cutting block is pinned in this rotation and the anterior, posterior and chamfer cuts are made with the oscillating saw. The intercondylar block is then placed and that cut is  made.      Trial size 5 tibial component, trial size 6 posterior stabilized femur and a 8  mm posterior stabilized rotating platform insert trial is placed. Full extension is achieved with excellent varus/valgus and anterior/posterior balance throughout full range of motion. The patella is everted and thickness measured to be 25  mm. Free hand resection is taken to 14 mm, a 41 template is placed, lug  holes are drilled, trial patella is placed, and it tracks normally. Osteophytes are removed off the posterior femur with the trial in place. All trials are removed and the cut bone surfaces prepared with pulsatile lavage. Cement is mixed and once ready for implantation, the size 5 tibial implant, size  6 posterior stabilized femoral component, and the size 41 patella are cemented in place and the patella is held with the clamp. The trial insert is placed and the knee held in full extension. The Exparel  (20 ml mixed with 60 ml saline) is injected into the extensor mechanism, posterior capsule, medial and lateral gutters and subcutaneous tissues.  All extruded cement is removed and once the cement is hard the permanent 8 mm posterior stabilized rotating platform insert is placed into the tibial tray.      The wound is copiously irrigated with saline solution and the extensor mechanism closed with # 0 Stratofix suture. The tourniquet is released for a total tourniquet time of 41  minutes. Flexion against gravity is 140 degrees and the patella tracks normally. Subcutaneous tissue is closed with 2.0 vicryl and subcuticular with running 4.0 Monocryl. The incision is cleaned and dried and steri-strips and a bulky sterile dressing are applied. The limb is placed into a knee immobilizer and the patient is awakened and transported to recovery in stable condition.      Please note that a surgical assistant was a medical necessity for this procedure in order to perform it in a safe and expeditious manner. Surgical assistant was necessary to retract the ligaments and vital neurovascular structures to prevent injury to them and also necessary for proper positioning of the limb to allow for anatomic placement of the prosthesis.   Dempsey ROCKFORD [name] Mowbray, MD    01/30/2024, 11:28 AM

## 2024-01-30 NOTE — Transfer of Care (Signed)
 Immediate Anesthesia Transfer of Care Note  Patient: Jeanne [name]  Procedure(s) Performed: ARTHROPLASTY, KNEE, TOTAL (Left: Knee)  Patient Location: PACU  Anesthesia Type:MAC and Spinal  Level of Consciousness: alert , oriented, and drowsy  Airway & Oxygen Therapy: Patient Spontanous Breathing  Post-op Assessment: Report given to RN and Post -op Vital signs reviewed and stable  Post vital signs: Reviewed and stable  Last Vitals:  Vitals Value Taken Time  BP 94/56 01/30/24 11:54  Temp    Pulse 46 01/30/24 11:59  Resp 19 01/30/24 11:59  SpO2 91 % 01/30/24 11:59  Vitals shown include unfiled device data.  Last Pain:  Vitals:   01/30/24 0810  TempSrc: Oral  PainSc: 0-No pain         Complications: No notable events documented.

## 2024-01-30 NOTE — Interval H&P Note (Signed)
 History and Physical Interval Note:  01/30/2024 8:10 AM  Jeanne [name]  has presented today for surgery, with the diagnosis of Left knee osteoarthritis.  The various methods of treatment have been discussed with the patient and family. After consideration of risks, benefits and other options for treatment, the patient has consented to  Procedure(s): ARTHROPLASTY, KNEE, TOTAL (Left) as a surgical intervention.  The patient's history has been reviewed, patient examined, no change in status, stable for surgery.  I have reviewed the patient's chart and labs.  Questions were answered to the patient's satisfaction.     Dempsey [name]

## 2024-01-31 ENCOUNTER — Encounter (HOSPITAL_COMMUNITY): Payer: Self-pay | Admitting: Orthopedic Surgery

## 2024-01-31 ENCOUNTER — Other Ambulatory Visit (HOSPITAL_COMMUNITY): Payer: Self-pay

## 2024-01-31 DIAGNOSIS — M1712 Unilateral primary osteoarthritis, left knee: Secondary | ICD-10-CM | POA: Diagnosis not present

## 2024-01-31 LAB — BASIC METABOLIC PANEL WITH GFR
Anion gap: 9 (ref 5–15)
BUN: 15 mg/dL (ref 8–23)
CO2: 22 mmol/L (ref 22–32)
Calcium: 8.5 mg/dL — ABNORMAL LOW (ref 8.9–10.3)
Chloride: 106 mmol/L (ref 98–111)
Creatinine, Ser: 0.96 mg/dL (ref 0.44–1.00)
GFR, Estimated: >60 mL/min (ref >60)
Glucose, Bld: 134 mg/dL — ABNORMAL HIGH (ref 70–99)
Potassium: 3.6 mmol/L (ref 3.5–5.1)
Sodium: 137 mmol/L (ref 135–145)

## 2024-01-31 LAB — CBC
HCT: 37.3 % (ref 36.0–46.0)
Hemoglobin: 12.3 g/dL (ref 12.0–15.0)
MCH: 32.2 pg (ref 26.0–34.0)
MCHC: 33 g/dL (ref 30.0–36.0)
MCV: 97.6 fL (ref 80.0–100.0)
Platelets: 170 K/uL (ref 150–400)
RBC: 3.82 MIL/uL — ABNORMAL LOW (ref 3.87–5.11)
RDW: 12.6 % (ref 11.5–15.5)
WBC: 7.2 K/uL (ref 4.0–10.5)
nRBC: 0 % (ref 0.0–0.2)

## 2024-01-31 MED ORDER — RIVAROXABAN 10 MG PO TABS
10.0000 mg | ORAL_TABLET | Freq: Every day | ORAL | 0 refills | Status: AC
  Filled 2024-01-31: qty 20, 20d supply, fill #0

## 2024-01-31 MED ORDER — ONDANSETRON HCL 4 MG PO TABS
4.0000 mg | ORAL_TABLET | Freq: Four times a day (QID) | ORAL | 0 refills | Status: AC | PRN
  Filled 2024-01-31: qty 20, 5d supply, fill #0

## 2024-01-31 MED ORDER — OXYCODONE HCL 5 MG PO TABS
5.0000 mg | ORAL_TABLET | Freq: Four times a day (QID) | ORAL | 0 refills | Status: AC | PRN
  Filled 2024-01-31: qty 42, 6d supply, fill #0

## 2024-01-31 MED ORDER — METHOCARBAMOL 500 MG PO TABS
500.0000 mg | ORAL_TABLET | Freq: Four times a day (QID) | ORAL | 0 refills | Status: AC | PRN
  Filled 2024-01-31: qty 40, 10d supply, fill #0

## 2024-01-31 NOTE — Progress Notes (Signed)
 Subjective: 1 Day Post-Op Procedure(s) (LRB): ARTHROPLASTY, KNEE, TOTAL (Left) Patient reports pain as mild.   Patient seen in rounds by Dr. [name]. Patient is doing fair other than pain this morning. Denies chest pain, SOB, or calf pain. Foley catheter removed this AM.  We will continue therapy today, ambulated 60' yesterday.   Objective: Vital signs in last 24 hours: Temp:  [97.4 F (36.3 C)-98.7 F (37.1 C)] 97.8 F (36.6 C) (10/14 0505) Pulse Rate:  [44-72] 64 (10/14 0505) Resp:  [10-25] 18 (10/14 0505) BP: (94-141)/(53-84) 119/84 (10/14 0505) SpO2:  [91 %-100 %] 100 % (10/14 0505)  Intake/Output from previous day:  Intake/Output Summary (Last 24 hours) at 01/31/2024 0900 Last data filed at 01/31/2024 0641 Gross per 24 hour  Intake 3113.78 ml  Output 3155 ml  Net -41.22 ml     Intake/Output this shift: No intake/output data recorded.  Labs: Recent Labs    01/31/24 0317  HGB 12.3   Recent Labs    01/31/24 0317  WBC 7.2  RBC 3.82*  HCT 37.3  PLT 170   Recent Labs    01/31/24 0317  NA 137  K 3.6  CL 106  CO2 22  BUN 15  CREATININE 0.96  GLUCOSE 134*  CALCIUM  8.5*   No results for input(s): LABPT, INR in the last 72 hours.  Exam: General - Patient is Alert and Oriented Extremity - Neurologically intact Neurovascular intact Sensation intact distally Dorsiflexion/Plantar flexion intact Dressing - dressing C/D/I Motor Function - intact, moving foot and toes well on exam.   Past Medical History:  Diagnosis Date   Antiphospholipid antibody syndrome    Anxiety    Arthritis    Bilateral swelling of feet    Carpal tunnel syndrome 01/02/2014   Bilateral   Clotting disorder    Collagen disease 04/21/2021   Complication of anesthesia    use glidescope   Constipation    Depression    Diabetes mellitus type 2, insulin  dependent (HCC) 02/26/2019   Difficult intubation 07/09/2013   Glidescope used, see Anesthesia note   DVT (deep venous  thrombosis) (HCC) 2014   LEFT LEG   Gastritis    GERD (gastroesophageal reflux disease)    Glaucoma    Heart murmur    History of blood clots    Hyperlipidemia    Hypertension    Hypokalemia 03/05/2013   Kidney disease    stage ll   Neuromuscular disorder (HCC)     NEUROPATHY IN  MY HANDS   Obsessive-compulsive disorder    Osteoarthritis    Rheumatoid arthritis (HCC) 2016   Schizo-affective psychosis (HCC)    Schizoaffective disorder (HCC)    Scleritis of both eyes    Sleep apnea    Bipap   Suppurative hidradenitis    Undifferentiated connective tissue disease     Assessment/Plan: 1 Day Post-Op Procedure(s) (LRB): ARTHROPLASTY, KNEE, TOTAL (Left) Principal Problem:   OA (osteoarthritis) of knee Active Problems:   Primary osteoarthritis of left knee  Estimated body mass index is 32.78 kg/m as calculated from the following:   Height as of this encounter: 5' 9 (1.753 m).   Weight as of this encounter: 100.7 kg. Advance diet Up with therapy D/C IV fluids   Patient's anticipated LOS is less than 2 midnights, meeting these requirements: - Younger than 50 - Lives within 1 hour of care - Has a competent adult at home to recover with post-op recover - NO history of  - Chronic pain  requiring opioids  - Coronary Artery Disease  - Heart failure  - Heart attack  - Stroke  - Cardiac arrhythmia  - Respiratory Failure/COPD  - Renal failure  - Anemia  - Advanced Liver disease   DVT Prophylaxis - Xarelto Weight bearing as tolerated. Continue therapy.  Plan is to go Home after hospital stay. Plan for discharge later today if progresses with therapy and meeting goals. Scheduled for OPPT at [facility]. Follow-up in the office in 2 weeks.  The PDMP database was reviewed today prior to any opioid medications being prescribed to this patient.  Roxie Mess, PA-C Orthopedic Surgery [phone] 01/31/2024, 9:00 AM

## 2024-01-31 NOTE — TOC Transition Note (Signed)
 Transition of Care [facility]) - Discharge Note   Patient Details  Name: Jeanne [name] MRN: [id number] Date of Birth: 1960-08-03  Transition of Care [facility]) CM/SW Contact:  NORMAN ASPEN, LCSW Phone Number: 01/31/2024, 9:58 AM   Clinical Narrative:     Met with pt who confirms she has needed DME in the home.  OPPT already arranged with [facility].  No further IP CM needs.   Final next level of care: OP Rehab Barriers to Discharge: No Barriers Identified   Patient Goals and CMS Choice Patient states their goals for this hospitalization and ongoing recovery are:: return home          Discharge Placement                       Discharge Plan and Services Additional resources added to the After Visit Summary for                  DME Arranged: N/A DME Agency: NA                  Social Drivers of Health (SDOH) Interventions SDOH Screenings   Food Insecurity: No Food Insecurity (01/30/2024)  Housing: Low Risk  (01/30/2024)  Transportation Needs: No Transportation Needs (01/30/2024)  Utilities: Not At Risk (01/30/2024)  Depression (PHQ2-9): Low Risk  (01/26/2024)  Tobacco Use: High Risk (01/30/2024)     Readmission Risk Interventions     No data to display

## 2024-01-31 NOTE — Plan of Care (Signed)
  Problem: Education: Goal: Knowledge of General Education information will improve Description: Including pain rating scale, medication(s)/side effects and non-pharmacologic comfort measures Outcome: Adequate for Discharge   Problem: Health Behavior/Discharge Planning: Goal: Ability to manage health-related needs will improve Outcome: Adequate for Discharge   Problem: Clinical Measurements: Goal: Ability to maintain clinical measurements within normal limits will improve Outcome: Progressing Goal: Will remain free from infection Outcome: Progressing Goal: Diagnostic test results will improve Outcome: Adequate for Discharge Goal: Respiratory complications will improve Outcome: Progressing Goal: Cardiovascular complication will be avoided Outcome: Progressing   Problem: Activity: Goal: Risk for activity intolerance will decrease Outcome: Adequate for Discharge   Problem: Clinical Measurements: Goal: Ability to maintain clinical measurements within normal limits will improve Outcome: Progressing Goal: Will remain free from infection Outcome: Progressing Goal: Diagnostic test results will improve Outcome: Adequate for Discharge Goal: Respiratory complications will improve Outcome: Progressing Goal: Cardiovascular complication will be avoided Outcome: Progressing   Problem: Activity: Goal: Risk for activity intolerance will decrease Outcome: Adequate for Discharge   Problem: Nutrition: Goal: Adequate nutrition will be maintained Outcome: Completed/Met   Problem: Coping: Goal: Level of anxiety will decrease Outcome: Progressing   Problem: Elimination: Goal: Will not experience complications related to bowel motility Outcome: Completed/Met Goal: Will not experience complications related to urinary retention Outcome: Completed/Met   Problem: Pain Managment: Goal: General experience of comfort will improve and/or be controlled Outcome: Progressing   Problem:  Safety: Goal: Ability to remain free from injury will improve Outcome: Progressing   Problem: Skin Integrity: Goal: Risk for impaired skin integrity will decrease Outcome: Progressing   Problem: Education: Goal: Knowledge of the prescribed therapeutic regimen will improve Outcome: Progressing   Problem: Activity: Goal: Ability to avoid complications of mobility impairment will improve Outcome: Adequate for Discharge Goal: Range of joint motion will improve Outcome: Adequate for Discharge   Problem: Clinical Measurements: Goal: Postoperative complications will be avoided or minimized Outcome: Progressing   Problem: Pain Management: Goal: Pain level will decrease with appropriate interventions Outcome: Adequate for Discharge   Problem: Skin Integrity: Goal: Will show signs of wound healing Outcome: Progressing

## 2024-01-31 NOTE — Progress Notes (Signed)
 Physical Therapy Treatment Patient Details Name: Jeanne [name] MRN: [id number] DOB: April 24, 1960 Today's Date: 01/31/2024   History of Present Illness Pt is 63 yo female s/p L TKA on 01/30/24.  Pt with hx including but not limited to OA, DVT, RA, DM2, GERD, HLD, HTN, OCD, schizoaffective disorder, sleep apnea, L THA 3/25    PT Comments  Pt with gradual progress but did have some limitations due to pain.  She ambulated 38' but at decreased speed and with rest breaks.  Pt has 4 steps to enter home -held stair practice this morning due to pain with plan to f/u in afternoon.     If plan is discharge home, recommend the following: A little help with walking and/or transfers;A little help with bathing/dressing/bathroom;Assistance with cooking/housework;Help with stairs or ramp for entrance   Can travel by private vehicle        Equipment Recommendations  None recommended by PT    Recommendations for Other Services       Precautions / Restrictions Precautions Precautions: Fall;Knee Restrictions LLE Weight Bearing Per Provider Order: Weight bearing as tolerated     Mobility  Bed Mobility Overal bed mobility: Needs Assistance Bed Mobility: Supine to Sit, Sit to Supine     Supine to sit: Contact guard, HOB elevated Sit to supine: Contact guard assist, HOB elevated   General bed mobility comments: educated on using belt to assist    Transfers Overall transfer level: Needs assistance Equipment used: Rolling walker (2 wheels) Transfers: Sit to/from Stand Sit to Stand: Contact guard assist, From elevated surface           General transfer comment: cues for hand placement and controlled descent    Ambulation/Gait Ambulation/Gait assistance: Contact guard assist Gait Distance (Feet): 60 Feet Assistive device: Rolling walker (2 wheels) Gait Pattern/deviations: Step-to pattern, Decreased stride length, Decreased weight shift to left, Antalgic Gait velocity:  decreased     General Gait Details: REquired 2-3 standing rest breaks for pain control and rest, decreased speed but steady balance, cues for RW proxiimty once   Stairs Stairs:  (held this morning)           Wheelchair Mobility     Tilt Bed    Modified Rankin (Stroke Patients Only)       Balance Overall balance assessment: Needs assistance Sitting-balance support: No upper extremity supported Sitting balance-Leahy Scale: Good     Standing balance support: Bilateral upper extremity supported, Reliant on assistive device for balance Standing balance-Leahy Scale: Poor                              Communication    Cognition Arousal: Alert Behavior During Therapy: WFL for tasks assessed/performed   PT - Cognitive impairments: No apparent impairments                                Cueing    Exercises Total Joint Exercises Ankle Circles/Pumps: AROM, Both, 10 reps, Supine Quad Sets: AROM, Both, 10 reps, Supine Heel Slides: AAROM, Left, 10 reps, Supine Hip ABduction/ADduction: AAROM, Left, 10 reps, Supine Long Arc Quad: Left, AAROM, 5 reps, Seated Knee Flexion: AAROM, Left, 10 reps, Seated Goniometric ROM: L knee ~5 to 70 degrees    General Comments        Pertinent Vitals/Pain Pain Assessment Pain Assessment: 0-10 Pain Score: 7  Pain Location: L Knee  Pain Descriptors / Indicators: Discomfort Pain Intervention(s): Limited activity within patient's tolerance, Monitored during session, Relaxation, Premedicated before session, Repositioned, Ice applied    Home Living                          Prior Function            PT Goals (current goals can now be found in the care plan section) Progress towards PT goals: Progressing toward goals    Frequency    7X/week      PT Plan      Co-evaluation              AM-PAC PT 6 Clicks Mobility   Outcome Measure  Help needed turning from your back to your side  while in a flat bed without using bedrails?: A Little Help needed moving from lying on your back to sitting on the side of a flat bed without using bedrails?: A Little Help needed moving to and from a bed to a chair (including a wheelchair)?: A Little Help needed standing up from a chair using your arms (e.g., wheelchair or bedside chair)?: A Little Help needed to walk in hospital room?: A Little Help needed climbing 3-5 steps with a railing? : A Lot 6 Click Score: 17    End of Session Equipment Utilized During Treatment: Gait belt Activity Tolerance: Patient limited by pain Patient left: with call bell/phone within reach;with SCD's reapplied;in bed;with bed alarm set Nurse Communication: Mobility status PT Visit Diagnosis: Other abnormalities of gait and mobility (R26.89);Muscle weakness (generalized) (M62.81)     Time: 8942-8870 PT Time Calculation (min) (ACUTE ONLY): 32 min  Charges:    $Gait Training: 8-22 mins $Therapeutic Exercise: 8-22 mins PT General Charges $$ ACUTE PT VISIT: 1 Visit                    Benjiman, PT Acute Rehab Services [facility] [phone]    Benjiman VEAR Mulberry 01/31/2024, 12:21 PM

## 2024-01-31 NOTE — Care Management Obs Status (Signed)
 MEDICARE OBSERVATION STATUS NOTIFICATION   Patient Details  Name: Iliany Denise Angola MRN: [id number] Date of Birth: 1961-02-11   Medicare Observation Status Notification Given:  Yes    HOYLEDORIAN, LCSW 01/31/2024, 11:16 AM

## 2024-01-31 NOTE — Progress Notes (Signed)
 01/31/2024 4:21 PM -----------------------------------------------------------[facility]--------------------------------------------------- D(Data) A(Action) R(response)     Data: Discharge Readiness Assessment EDD listed for tomorrow 02/01/2024    Action: Chart reviewed    Response: No immediate Barriers to discharge identified at this time.     Kanaya Gunnarson, RN [employer]

## 2024-01-31 NOTE — Progress Notes (Signed)
 Physical Therapy Treatment Patient Details Name: Jeanne [name] MRN: [id number] DOB: May 30, 1960 Today's Date: 01/31/2024   History of Present Illness Pt is 63 yo female s/p L TKA on 01/30/24.  Pt with hx including but not limited to OA, DVT, RA, DM2, GERD, HLD, HTN, OCD, schizoaffective disorder, sleep apnea, L THA 3/25    PT Comments  Pt had some dizziness during stair training.  Her VSS in sitting and stand, she did describe more as dizzy than lightheaded.  Pt was not able to progress to stairs similar to home and reports she is nervous about ability to return home today.  PT agrees.  Would benefit from further therapy prior to return home.     If plan is discharge home, recommend the following: A little help with walking and/or transfers;A little help with bathing/dressing/bathroom;Assistance with cooking/housework;Help with stairs or ramp for entrance   Can travel by private vehicle        Equipment Recommendations  None recommended by PT    Recommendations for Other Services       Precautions / Restrictions Precautions Precautions: Fall;Knee Restrictions LLE Weight Bearing Per Provider Order: Weight bearing as tolerated     Mobility  Bed Mobility Overal bed mobility: Needs Assistance Bed Mobility: Supine to Sit, Sit to Supine     Supine to sit: Contact guard, HOB elevated Sit to supine: Contact guard assist, HOB elevated   General bed mobility comments: educated on using belt to assist    Transfers Overall transfer level: Needs assistance Equipment used: Rolling walker (2 wheels) Transfers: Sit to/from Stand Sit to Stand: Contact guard assist, From elevated surface           General transfer comment: cues for hand placement and controlled descent    Ambulation/Gait Ambulation/Gait assistance: Contact guard assist Gait Distance (Feet): 30 Feet (30'x2) Assistive device: Rolling walker (2 wheels) Gait Pattern/deviations: Step-to pattern, Decreased  stride length, Decreased weight shift to left, Antalgic Gait velocity: decreased     General Gait Details: Required 2-3 standing rest breaks for pain control and rest, decreased speed but steady balance, cues for RW proximity; pt was dizzy after stairs and had to take a seated rest break.  BP was stable 130's/70's sitting and standing.   Stairs Stairs: Yes Stairs assistance: Min assist Stair Management: Two rails, Step to pattern, Forwards Number of Stairs: 3 General stair comments: Pt nervous about trying stairs.  She performed 3 of the 4 steps with bil rails and light min A.  Pt dizzy coming back down and had to sit.  VSS but pt not able to attempt steps again to progress.  She has bil rails but are too far apart to effectively use.   Wheelchair Mobility     Tilt Bed    Modified Rankin (Stroke Patients Only)       Balance Overall balance assessment: Needs assistance Sitting-balance support: No upper extremity supported Sitting balance-Leahy Scale: Good     Standing balance support: Bilateral upper extremity supported, Reliant on assistive device for balance Standing balance-Leahy Scale: Poor                              Communication    Cognition Arousal: Alert Behavior During Therapy: WFL for tasks assessed/performed   PT - Cognitive impairments: No apparent impairments  Cueing    Exercises Total Joint Exercises Ankle Circles/Pumps: AROM, Both, 10 reps, Supine Quad Sets: AROM, Both, 10 reps, Supine Heel Slides: AAROM, Left, 10 reps, Supine Hip ABduction/ADduction: AAROM, Left, 10 reps, Supine Long Arc Quad: Left, AAROM, 5 reps, Seated Knee Flexion: AAROM, Left, 10 reps, Seated Goniometric ROM: L knee ~5 to 70 degrees    General Comments        Pertinent Vitals/Pain Pain Assessment Pain Assessment: 0-10 Pain Score: 7  Pain Location: L Knee Pain Descriptors / Indicators: Discomfort Pain  Intervention(s): Limited activity within patient's tolerance, Monitored during session, Premedicated before session, Repositioned, Ice applied    Home Living                          Prior Function            PT Goals (current goals can now be found in the care plan section) Progress towards PT goals: Progressing toward goals    Frequency    7X/week      PT Plan      Co-evaluation              AM-PAC PT 6 Clicks Mobility   Outcome Measure  Help needed turning from your back to your side while in a flat bed without using bedrails?: A Little Help needed moving from lying on your back to sitting on the side of a flat bed without using bedrails?: A Little Help needed moving to and from a bed to a chair (including a wheelchair)?: A Little Help needed standing up from a chair using your arms (e.g., wheelchair or bedside chair)?: A Little Help needed to walk in hospital room?: A Little Help needed climbing 3-5 steps with a railing? : A Little 6 Click Score: 18    End of Session Equipment Utilized During Treatment: Gait belt Activity Tolerance: Other (comment) (limited by dizziness) Patient left: with call bell/phone within reach;in bed;with bed alarm set Nurse Communication: Mobility status PT Visit Diagnosis: Other abnormalities of gait and mobility (R26.89);Muscle weakness (generalized) (M62.81)     Time: 1419-1440 PT Time Calculation (min) (ACUTE ONLY): 21 min  Charges:    $Gait Training: 8-22 mins  PT General Charges $$ ACUTE PT VISIT: 1 Visit                     Benjiman, PT Acute Rehab Services [facility] [phone]    Benjiman VEAR Mulberry 01/31/2024, 3:18 PM

## 2024-01-31 NOTE — Progress Notes (Signed)
   01/31/24 2250  BiPAP/CPAP/SIPAP  BiPAP/CPAP/SIPAP Pt Type Adult  BiPAP/CPAP/SIPAP Resmed  Mask Type Prongs-short  Dentures removed? Not applicable  Mask Size Medium  IPAP 12 cmH20  FiO2 (%) 21 %  Patient Home Machine No  Patient Home Mask Yes  Patient Home Tubing Yes  Auto Titrate No  Device Plugged into RED Power Outlet Yes

## 2024-02-01 ENCOUNTER — Other Ambulatory Visit (HOSPITAL_COMMUNITY): Payer: Self-pay

## 2024-02-01 DIAGNOSIS — M1712 Unilateral primary osteoarthritis, left knee: Secondary | ICD-10-CM | POA: Diagnosis not present

## 2024-02-01 LAB — CBC
HCT: 35.7 % — ABNORMAL LOW (ref 36.0–46.0)
Hemoglobin: 11.7 g/dL — ABNORMAL LOW (ref 12.0–15.0)
MCH: 32.3 pg (ref 26.0–34.0)
MCHC: 32.8 g/dL (ref 30.0–36.0)
MCV: 98.6 fL (ref 80.0–100.0)
Platelets: 144 K/uL — ABNORMAL LOW (ref 150–400)
RBC: 3.62 MIL/uL — ABNORMAL LOW (ref 3.87–5.11)
RDW: 12.9 % (ref 11.5–15.5)
WBC: 7.9 K/uL (ref 4.0–10.5)
nRBC: 0 % (ref 0.0–0.2)

## 2024-02-01 NOTE — Progress Notes (Signed)
 Physical Therapy Treatment Patient Details Name: Jeanne [name] MRN: [id number] DOB: August 09, 1960 Today's Date: 02/01/2024   History of Present Illness Pt is 63 yo female s/p L TKA on 01/30/24.  Pt with hx including but not limited to OA, DVT, RA, DM2, GERD, HLD, HTN, OCD, schizoaffective disorder, sleep apnea, L THA 3/25    PT Comments  Pt is POD # 2 and is progressing well.  Pt with improved gait this afternoon.  She was able to ambulate 100' with RW and performed stairs similar to home set up.  Pt moves slowly but is cautious and steady.  She is comfortable with her pain control and ability to return home. Pt demonstrates safe gait & transfers in order to return home from PT perspective once discharged by MD.  While in hospital, will continue to benefit from PT for skilled therapy to advance mobility and exercises.       If plan is discharge home, recommend the following: A little help with walking and/or transfers;A little help with bathing/dressing/bathroom;Assistance with cooking/housework;Help with stairs or ramp for entrance   Can travel by private vehicle        Equipment Recommendations  None recommended by PT    Recommendations for Other Services       Precautions / Restrictions Precautions Precautions: Fall;Knee Restrictions LLE Weight Bearing Per Provider Order: Weight bearing as tolerated     Mobility  Bed Mobility               General bed mobility comments: in chair    Transfers Overall transfer level: Needs assistance Equipment used: Rolling walker (2 wheels) Transfers: Sit to/from Stand Sit to Stand: Supervision           General transfer comment: STS from recliner x 2 and BSC x 1; pt having to extend L LE forward for controlled sit    Ambulation/Gait Ambulation/Gait assistance: Supervision Gait Distance (Feet): 100 Feet (100' then 10'x2) Assistive device: Rolling walker (2 wheels) Gait Pattern/deviations: Step-to pattern, Decreased  stride length, Decreased weight shift to left, Antalgic Gait velocity: decreased     General Gait Details: Pt moves slowly and cautiously but is steady with RW.  She did not need as many standing rest breaks this afternoon (initially every 5-6 steps but then progressing to walking 30' at a time)   Stairs Stairs: Yes Stairs assistance: Contact guard assist Stair Management: Step to pattern, Sideways, One rail Left Number of Stairs: 3 General stair comments: Pt recalling correct rail to use and was reminded of sequencing.  Performed 6 steps.  Again moved slowly and cautiously but steady and no assist   Wheelchair Mobility     Tilt Bed    Modified Rankin (Stroke Patients Only)       Balance Overall balance assessment: Needs assistance Sitting-balance support: No upper extremity supported Sitting balance-Leahy Scale: Good     Standing balance support: Reliant on assistive device for balance, No upper extremity supported Standing balance-Leahy Scale: Fair Standing balance comment: RW to ambulate but could stand without support                            Communication    Cognition Arousal: Alert Behavior During Therapy: WFL for tasks assessed/performed   PT - Cognitive impairments: No apparent impairments                       PT - Cognition Comments:  motivated to return home        Cueing    Exercises Total Joint Exercises  Other Exercises: held exercises as pt d/cing and wanting to maintain pain control and energy    General Comments General comments (skin integrity, edema, etc.): Pt comfortable with her mobility and pain control to return home.  Educated on safe ice use, no pivots, car transfers, resting with leg straight, and TED hose during day. Also, encouraged walking every 1-2 hours during day. Educated on HEP with focus on mobility the first weeks. Discussed doing exercises within pain control and if pain increasing could decreased ROM,  reps, and stop exercises as needed. Encouraged to perform quad sets and ankle pumps frequently for blood flow and to promote full knee extension.      Pertinent Vitals/Pain Pain Assessment Pain Assessment: 0-10 Pain Score: 6  Pain Location: L Knee Pain Descriptors / Indicators: Discomfort Pain Intervention(s): Limited activity within patient's tolerance, Monitored during session, Premedicated before session, Repositioned    Home Living                          Prior Function            PT Goals (current goals can now be found in the care plan section) Progress towards PT goals: Progressing toward goals    Frequency    7X/week      PT Plan      Co-evaluation              AM-PAC PT 6 Clicks Mobility   Outcome Measure  Help needed turning from your back to your side while in a flat bed without using bedrails?: A Little Help needed moving from lying on your back to sitting on the side of a flat bed without using bedrails?: A Little Help needed moving to and from a bed to a chair (including a wheelchair)?: A Little Help needed standing up from a chair using your arms (e.g., wheelchair or bedside chair)?: A Little Help needed to walk in hospital room?: A Little Help needed climbing 3-5 steps with a railing? : A Little 6 Click Score: 18    End of Session Equipment Utilized During Treatment: Gait belt Activity Tolerance: Patient tolerated treatment well (limited by dizziness) Patient left: with call bell/phone within reach;with chair alarm set;in chair Nurse Communication: Mobility status PT Visit Diagnosis: Other abnormalities of gait and mobility (R26.89);Muscle weakness (generalized) (M62.81)     Time: 8559-8485 PT Time Calculation (min) (ACUTE ONLY): 34 min  Charges:    $Gait Training: 8-22 mins $Therapeutic Activity: 8-22 mins PT General Charges $$ ACUTE PT VISIT: 1 Visit                     Benjiman, PT Acute Rehab Services [facility] [phone]    Benjiman VEAR Mulberry 02/01/2024, 3:24 PM

## 2024-02-01 NOTE — Progress Notes (Signed)
 Subjective: 2 Days Post-Op Procedure(s) (LRB): ARTHROPLASTY, KNEE, TOTAL (Left) Patient reports pain as moderate.   Patient seen in rounds for Dr. [name]. Patient is doing fair this morning, had increased pain last night most likely from blocks wearing off. Had issues with dizziness yesterday preventing discharge, denies any problems with this at this time.  Plan is to go Home after hospital stay.  Objective: Vital signs in last 24 hours: Temp:  [97.7 F (36.5 C)-100.1 F (37.8 C)] 100.1 F (37.8 C) (10/15 0543) Pulse Rate:  [65-87] 87 (10/15 0543) Resp:  [16-18] 16 (10/15 0543) BP: (133-152)/(64-72) 152/72 (10/15 0543) SpO2:  [95 %-98 %] 95 % (10/15 0543) FiO2 (%):  [21 %] 21 % (10/14 2250)  Intake/Output from previous day:  Intake/Output Summary (Last 24 hours) at 02/01/2024 0820 Last data filed at 01/31/2024 1943 Gross per 24 hour  Intake 360 ml  Output --  Net 360 ml    Intake/Output this shift: No intake/output data recorded.  Labs: Recent Labs    01/31/24 0317 02/01/24 0317  HGB 12.3 11.7*   Recent Labs    01/31/24 0317 02/01/24 0317  WBC 7.2 7.9  RBC 3.82* 3.62*  HCT 37.3 35.7*  PLT 170 144*   Recent Labs    01/31/24 0317  NA 137  K 3.6  CL 106  CO2 22  BUN 15  CREATININE 0.96  GLUCOSE 134*  CALCIUM  8.5*   No results for input(s): LABPT, INR in the last 72 hours.  Exam: General - Patient is Alert and Oriented Extremity - Neurologically intact Neurovascular intact Sensation intact distally Dorsiflexion/Plantar flexion intact Dressing/Incision - clean, dry, no drainage Motor Function - intact, moving foot and toes well on exam.   Past Medical History:  Diagnosis Date   Antiphospholipid antibody syndrome    Anxiety    Arthritis    Bilateral swelling of feet    Carpal tunnel syndrome 01/02/2014   Bilateral   Clotting disorder    Collagen disease 04/21/2021   Complication of anesthesia    use glidescope   Constipation     Depression    Diabetes mellitus type 2, insulin  dependent (HCC) 02/26/2019   Difficult intubation 07/09/2013   Glidescope used, see Anesthesia note   DVT (deep venous thrombosis) (HCC) 2014   LEFT LEG   Gastritis    GERD (gastroesophageal reflux disease)    Glaucoma    Heart murmur    History of blood clots    Hyperlipidemia    Hypertension    Hypokalemia 03/05/2013   Kidney disease    stage ll   Neuromuscular disorder (HCC)     NEUROPATHY IN  MY HANDS   Obsessive-compulsive disorder    Osteoarthritis    Rheumatoid arthritis (HCC) 2016   Schizo-affective psychosis (HCC)    Schizoaffective disorder (HCC)    Scleritis of both eyes    Sleep apnea    Bipap   Suppurative hidradenitis    Undifferentiated connective tissue disease     Assessment/Plan: 2 Days Post-Op Procedure(s) (LRB): ARTHROPLASTY, KNEE, TOTAL (Left) Principal Problem:   OA (osteoarthritis) of knee Active Problems:   Primary osteoarthritis of left knee  Estimated body mass index is 32.78 kg/m as calculated from the following:   Height as of this encounter: 5' 9 (1.753 m).   Weight as of this encounter: 100.7 kg. Up with therapy  DVT Prophylaxis - Xarelto Weight-bearing as tolerated  Discharge to home today once cleared with PT  Roxie Mess, PA-C  Orthopedic Surgery [phone] 02/01/2024, 8:20 AM

## 2024-02-01 NOTE — Progress Notes (Addendum)
 Physical Therapy Treatment Patient Details Name: Jeanne [name] MRN: [id number] DOB: 30-Jun-1960 Today's Date: 02/01/2024   History of Present Illness Pt is 64 yo female s/p L TKA on 01/30/24.  Pt with hx including but not limited to OA, DVT, RA, DM2, GERD, HLD, HTN, OCD, schizoaffective disorder, sleep apnea, L THA 3/25    PT Comments  Pt did progress stairs today but still with slowed and limited gait distance.  Pt reports limited by pain.  Pt will have limited support at d/c - would benefit from another session of PT to progress.     If plan is discharge home, recommend the following: A little help with walking and/or transfers;A little help with bathing/dressing/bathroom;Assistance with cooking/housework;Help with stairs or ramp for entrance   Can travel by private vehicle        Equipment Recommendations  None recommended by PT    Recommendations for Other Services       Precautions / Restrictions Precautions Precautions: Fall;Knee Restrictions LLE Weight Bearing Per Provider Order: Weight bearing as tolerated     Mobility  Bed Mobility               General bed mobility comments: in chair    Transfers Overall transfer level: Needs assistance Equipment used: Rolling walker (2 wheels) Transfers: Sit to/from Stand Sit to Stand: Contact guard assist, From elevated surface           General transfer comment: cues for hand placement and controlled descent    Ambulation/Gait Ambulation/Gait assistance: Contact guard assist Gait Distance (Feet): 30 Feet Assistive device: Rolling walker (2 wheels) Gait Pattern/deviations: Step-to pattern, Decreased stride length, Decreased weight shift to left, Antalgic Gait velocity: decreased     General Gait Details: Pt initially with difficulty weight bearing and advancing L LE but did improve thorughout gait.  Still requiring rest breaks every 5-6 steps and slowed gait pattern.  Was steady with  RW   Stairs Stairs: Yes Stairs assistance: Contact guard assist Stair Management: Step to pattern Number of Stairs: 4 General stair comments: Reminded of sequencing.  Performed 2 of the 6 steps with bil rails then progressed to 2 of the 6 steps with L rail sideways.  Pt needing increased time and CGA but able to perform.   Wheelchair Mobility     Tilt Bed    Modified Rankin (Stroke Patients Only)       Balance Overall balance assessment: Needs assistance Sitting-balance support: No upper extremity supported Sitting balance-Leahy Scale: Good     Standing balance support: Bilateral upper extremity supported, Reliant on assistive device for balance Standing balance-Leahy Scale: Poor                              Communication    Cognition Arousal: Alert Behavior During Therapy: WFL for tasks assessed/performed   PT - Cognitive impairments: No apparent impairments                                Cueing    Exercises Total Joint Exercises Ankle Circles/Pumps: AROM, Both, 10 reps, Supine Quad Sets: AROM, Both, 10 reps, Supine Long Arc Quad: Left, AAROM, Seated, 10 reps Knee Flexion: AAROM, Left, 10 reps, Seated Goniometric ROM: L kntt ~5 to 60    General Comments        Pertinent Vitals/Pain Pain Assessment Pain Assessment: 0-10 Pain Score:  8  Pain Location: L Knee Pain Descriptors / Indicators: Discomfort Pain Intervention(s): Limited activity within patient's tolerance, Monitored during session, Premedicated before session, Repositioned, Ice applied    Home Living                          Prior Function            PT Goals (current goals can now be found in the care plan section) Progress towards PT goals: Progressing toward goals    Frequency    7X/week      PT Plan      Co-evaluation              AM-PAC PT 6 Clicks Mobility   Outcome Measure  Help needed turning from your back to your side  while in a flat bed without using bedrails?: A Little Help needed moving from lying on your back to sitting on the side of a flat bed without using bedrails?: A Little Help needed moving to and from a bed to a chair (including a wheelchair)?: A Little Help needed standing up from a chair using your arms (e.g., wheelchair or bedside chair)?: A Little Help needed to walk in hospital room?: A Little Help needed climbing 3-5 steps with a railing? : A Little 6 Click Score: 18    End of Session Equipment Utilized During Treatment: Gait belt Activity Tolerance: Patient tolerated treatment well  Patient left: with call bell/phone within reach;with chair alarm set;in chair Nurse Communication: Mobility status PT Visit Diagnosis: Other abnormalities of gait and mobility (R26.89);Muscle weakness (generalized) (M62.81)     Time: 8948-8884 PT Time Calculation (min) (ACUTE ONLY): 24 min  Charges:    $Gait Training: 8-22 mins $Therapeutic Exercise: 8-22 mins PT General Charges $$ ACUTE PT VISIT: 1 Visit                     Benjiman, PT Acute Rehab Services [facility] [phone]    Benjiman VEAR Mulberry 02/01/2024, 11:55 AM

## 2024-02-01 NOTE — Progress Notes (Signed)
 Pts PIV removed. AVS printed and provided to pt. Discharge medications and instructions reviewed with patient. Per pt her sister will drive her to [facility] to pick up her meds at discharge. Pt brought down to main entrance in wheelchair by Nurse tech.

## 2024-02-06 ENCOUNTER — Telehealth: Payer: Self-pay | Admitting: Psychiatry

## 2024-02-06 NOTE — Telephone Encounter (Signed)
 I have reviewed labs received from [facility] dated 01/31/2024 including CMP-within normal limits CBC with differential-WBC low at 4, RBC low at 4.1, MCH high at 32.5, lipid panel-HDL high at 101, LDL HDL ratio low at 0.7, TSH 8.64-tpuypw normal limits, vitamin D -within normal limits at 36.9 ,A1c of 5.4

## 2024-02-06 NOTE — Discharge Summary (Signed)
 Patient ID: Jeanne [name] MRN: [id number] DOB/AGE: 07-02-1960 63 y.o.  Admit date: 01/30/2024 Discharge date: 02/01/2024  Admission Diagnoses:  Principal Problem:   OA (osteoarthritis) of knee Active Problems:   Primary osteoarthritis of left knee   Discharge Diagnoses:  Same  Past Medical History:  Diagnosis Date   Antiphospholipid antibody syndrome    Anxiety    Arthritis    Bilateral swelling of feet    Carpal tunnel syndrome 01/02/2014   Bilateral   Clotting disorder    Collagen disease 04/21/2021   Complication of anesthesia    use glidescope   Constipation    Depression    Diabetes mellitus type 2, insulin  dependent (HCC) 02/26/2019   Difficult intubation 07/09/2013   Glidescope used, see Anesthesia note   DVT (deep venous thrombosis) (HCC) 2014   LEFT LEG   Gastritis    GERD (gastroesophageal reflux disease)    Glaucoma    Heart murmur    History of blood clots    Hyperlipidemia    Hypertension    Hypokalemia 03/05/2013   Kidney disease    stage ll   Neuromuscular disorder (HCC)     NEUROPATHY IN  MY HANDS   Obsessive-compulsive disorder    Osteoarthritis    Rheumatoid arthritis (HCC) 2016   Schizo-affective psychosis (HCC)    Schizoaffective disorder (HCC)    Scleritis of both eyes    Sleep apnea    Bipap   Suppurative hidradenitis    Undifferentiated connective tissue disease     Surgeries: Procedure(s): ARTHROPLASTY, KNEE, TOTAL on 01/30/2024   Consultants:   Discharged Condition: Improved  Hospital Course: Jeanne [name] is an 63 y.o. female who was admitted 01/30/2024 for operative treatment ofOA (osteoarthritis) of knee. Patient has severe unremitting pain that affects sleep, daily activities, and work/hobbies. After pre-op clearance the patient was taken to the operating room on 01/30/2024 and underwent  Procedure(s): ARTHROPLASTY, KNEE, TOTAL.    Patient was given perioperative antibiotics:  Anti-infectives  (From admission, onward)    Start     Dose/Rate Route Frequency Ordered Stop   01/30/24 1600  ceFAZolin  (ANCEF ) IVPB 2g/100 mL premix        2 g 200 mL/hr over 30 Minutes Intravenous Every 6 hours 01/30/24 1358 01/30/24 2200   01/30/24 0800  ceFAZolin  (ANCEF ) IVPB 2g/100 mL premix        2 g 200 mL/hr over 30 Minutes Intravenous On call to O.R. 01/30/24 0758 01/30/24 1023        Patient was given sequential compression devices, early ambulation, and chemoprophylaxis to prevent DVT.  Patient benefited maximally from hospital stay and there were no complications.    Recent vital signs: No data found.   Recent laboratory studies: No results for input(s): WBC, HGB, HCT, PLT, NA, K, CL, CO2, BUN, CREATININE, GLUCOSE, INR, CALCIUM  in the last 72 hours.  Invalid input(s): PT, 2   Discharge Medications:   Allergies as of 02/01/2024       Reactions   Ace Inhibitors Anaphylaxis   Arava [leflunomide] Other (See Comments)   Nephritis and kidney clearance issues   Travatan [travoprost] Other (See Comments)   Red eyes, irritation   Methotrexate  Other (See Comments)   Renal issues        Medication List     STOP taking these medications    aspirin  EC 81 MG tablet   CALCIUM  + D3 PO   COQ-10 PO   GLUCOSAMINE-CHONDROITIN PO   Krill Oil  300 MG Caps   multivitamin with minerals Tabs tablet   Turmeric Powd       TAKE these medications    amLODipine  5 MG tablet Commonly known as: NORVASC  Take 5 mg by mouth every morning.   ARIPiprazole  30 MG tablet Commonly known as: ABILIFY  Take 1 tablet (30 mg total) by mouth daily.   atorvastatin  40 MG tablet Commonly known as: LIPITOR Take 1 tablet (40 mg total) by mouth daily.   buPROPion  150 MG 24 hr tablet Commonly known as: WELLBUTRIN  XL Take 3 tablets (450 mg total) by mouth daily.   DULoxetine  30 MG capsule Commonly known as: CYMBALTA  Take 3 capsules (90 mg total) by mouth daily.    esomeprazole 40 MG capsule Commonly known as: NEXIUM Take 40 mg by mouth daily before breakfast.   furosemide 40 MG tablet Commonly known as: LASIX Take 40 mg by mouth in the morning.   inFLIXimab-dyyb 3 mg/kg in sodium chloride  0.9 % Inject 3 mg/kg into the vein every 8 (eight) weeks.   levobunolol  0.5 % ophthalmic solution Commonly known as: BETAGAN  Place 1 drop into both eyes every morning.   methocarbamol  500 MG tablet Commonly known as: ROBAXIN  Take 1 tablet (500 mg total) by mouth every 6 (six) hours as needed for muscle spasms.   Mounjaro  15 MG/0.5ML Pen Generic drug: tirzepatide  Inject 15 mg into the skin once a week.   Mounjaro  15 MG/0.5ML Pen Generic drug: tirzepatide  Inject 15 mg into the skin once a week.   NIFEdipine  60 MG 24 hr tablet Commonly known as: PROCARDIA  XL/NIFEDICAL XL Take 60 mg by mouth every morning.   ondansetron  4 MG tablet Commonly known as: ZOFRAN  Take 1 tablet (4 mg total) by mouth every 6 (six) hours as needed for nausea.   oxyCODONE  5 MG immediate release tablet Commonly known as: Oxy IR/ROXICODONE  Take 1-2 tablets (5-10 mg total) by mouth every 6 (six) hours as needed for breakthrough pain.   oxyCODONE -acetaminophen  5-325 MG tablet Commonly known as: PERCOCET/ROXICET Take 1 tablet by mouth 3 (three) times daily as needed for severe pain (pain score 7-10) (pain.).   PRESCRIPTION MEDICATION Inhale 1 application into the lungs at bedtime. BiPAP setting 8/12   QUEtiapine  300 MG tablet Commonly known as: SEROQUEL  Take 2 tablets (600 mg total) by mouth at bedtime.   verapamil  180 MG CR tablet Commonly known as: CALAN -SR Take 1 tablet (180 mg total) by mouth daily.   Xarelto 10 MG Tabs tablet Generic drug: rivaroxaban Take 1 tablet (10 mg total) by mouth daily with breakfast for 20 days. Then resume daily aspirin                Discharge Care Instructions  (From admission, onward)           Start     Ordered    01/31/24 0000  Weight bearing as tolerated        01/31/24 0903   01/31/24 0000  Change dressing       Comments: You may remove the bulky bandage (ACE wrap and gauze) two days after surgery. You will have an adhesive waterproof bandage underneath. Leave this in place until your first follow-up appointment.   01/31/24 0903            Diagnostic Studies: No results found.  Disposition: Discharge disposition: 01-Home or Self Care       Discharge Instructions     Call MD / Call 911   Complete by: As directed  If you experience chest pain or shortness of breath, CALL 911 and be transported to the hospital emergency room.  If you develope a fever above 101 F, pus (white drainage) or increased drainage or redness at the wound, or calf pain, call your surgeon's office.   Change dressing   Complete by: As directed    You may remove the bulky bandage (ACE wrap and gauze) two days after surgery. You will have an adhesive waterproof bandage underneath. Leave this in place until your first follow-up appointment.   Constipation Prevention   Complete by: As directed    Drink plenty of fluids.  Prune juice may be helpful.  You may use a stool softener, such as Colace (over the counter) 100 mg twice a day.  Use MiraLax  (over the counter) for constipation as needed.   Diet - low sodium heart healthy   Complete by: As directed    Do not put a pillow under the knee. Place it under the heel.   Complete by: As directed    Driving restrictions   Complete by: As directed    No driving for two weeks   Post-operative opioid taper instructions:   Complete by: As directed    POST-OPERATIVE OPIOID TAPER INSTRUCTIONS: It is important to wean off of your opioid medication as soon as possible. If you do not need pain medication after your surgery it is ok to stop day one. Opioids include: Codeine, Hydrocodone (Norco, Vicodin), Oxycodone (Percocet, oxycontin ) and hydromorphone  amongst others.  Long term  and even short term use of opiods can cause: Increased pain response Dependence Constipation Depression Respiratory depression And more.  Withdrawal symptoms can include Flu like symptoms Nausea, vomiting And more Techniques to manage these symptoms Hydrate well Eat regular healthy meals Stay active Use relaxation techniques(deep breathing, meditating, yoga) Do Not substitute Alcohol to help with tapering If you have been on opioids for less than two weeks and do not have pain than it is ok to stop all together.  Plan to wean off of opioids This plan should start within one week post op of your joint replacement. Maintain the same interval or time between taking each dose and first decrease the dose.  Cut the total daily intake of opioids by one tablet each day Next start to increase the time between doses. The last dose that should be eliminated is the evening dose.      TED hose   Complete by: As directed    Use stockings (TED hose) for three weeks on both leg(s).  You may remove them at night for sleeping.   Weight bearing as tolerated   Complete by: As directed         Follow-up Information     Aluisio, Dempsey, MD. Schedule an appointment as soon as possible for a visit in 2 week(s).   Specialty: Orthopedic Surgery Contact information: [address] [place] [address] [phone]                  Signed: Roxie Mess 02/06/2024, 7:18 AM

## 2024-02-08 ENCOUNTER — Ambulatory Visit (INDEPENDENT_AMBULATORY_CARE_PROVIDER_SITE_OTHER): Admitting: Psychology

## 2024-02-08 DIAGNOSIS — F331 Major depressive disorder, recurrent, moderate: Secondary | ICD-10-CM

## 2024-02-08 NOTE — Progress Notes (Signed)
 [facility] Counselor/Therapist Progress Note  Patient ID: Jeanne [name], MRN: [id number],    Date: 02/08/2024  Time Spent: 2:00pm-2:45pm   45 minutes   Treatment Type: Individual Therapy  Reported Symptoms: stress, disappointment  Mental Status Exam: Appearance:  Casual     Behavior: Appropriate  Motor: Normal  Speech/Language:  Normal Rate  Affect: Appropriate  Mood: normal  Thought process: normal  Thought content:   WNL  Sensory/Perceptual disturbances:   WNL  Orientation: oriented to person, place, time/date, and situation  Attention: Good  Concentration: Good  Memory: WNL  Fund of knowledge:  Good  Insight:   Good  Judgment:  Good  Impulse Control: Good   Risk Assessment: Danger to Self:  No Self-injurious Behavior: No Danger to Others: No Duty to Warn:no Physical Aggression / Violence:No  Access to Firearms a concern: No  Gang Involvement:No   Subjective:  Pt present for face-to-face individual therapy via video.  Pt consents to telehealth video session and is aware of limitations and benefits of virtual sessions. Location of pt: home Location of therapist: home office.  Pt talked about having a knee replacement last week.  She states it has been very painful.  Pt is trying not to take opiods for the pain.  She is in physical therapy and doing the home exercises she needs to do but it is difficult.   Pt's sister Antoine stayed with pt the first 2 nights after her surgery.  Pt's sister Heron was suppose to help after that but did not step up.  Pt is very disappointed in [place].  Barbara's son [name] lives with pt and he was not helpful either.   Helped pt process her feelings and disappointment. Pt states the house she lives in was left to her and her two sisters Heron and May.  Pt is worried about what will happen if her sisters pass before her bc they have left their portion of the house to their kids.   Addressed pt's concerns and how  she can communicate them with her sisters.   Provided supportive therapy.  Interventions: Cognitive Behavioral Therapy and Insight-Oriented  Diagnosis: F33.1  Plan of Care: Recommend ongoing therapy.   Pt participated in setting treatment goals.  Plan to continue to meet monthly.  Pt agrees with treatment plan.   Treatment Plan  (Treatment Plan Target Date:  10/18/2024 ) Client Abilities/Strengths  Pt is bright, engaging, and motivated for therapy.   Client Treatment Preferences  Individual therapy.  Client Statement of Needs  Improve coping skills.  Symptoms  Depressed or irritable mood.  Problems Addressed  Unipolar Depression Goals 1. Alleviate depressive symptoms and return to previous level of effective functioning. 2. Appropriately grieve the loss in order to normalize mood and to return to previously adaptive level of functioning. Objective Learn and implement behavioral strategies to overcome depression. Target Date: 2024-10-18 Frequency: Monthly  Progress: 60 Modality: individual  Related Interventions Engage the client in behavioral activation, increasing his/her activity level and contact with sources of reward, while identifying processes that inhibit activation.  Use behavioral techniques such as instruction, rehearsal, role-playing, role reversal, as needed, to facilitate activity in the client's daily life; reinforce success. Assist the client in developing skills that increase the likelihood of deriving pleasure from behavioral activation (e.g., assertiveness skills, developing an exercise plan, less internal/more external focus, increased social involvement); reinforce success. Objective Identify important people in life, past and present, and describe the quality, good and poor, of those  relationships. Target Date: 2024-10-18 Frequency: Monthly  Progress: 60 Modality: individual  Related Interventions Conduct Interpersonal Therapy beginning with the assessment  of the client's interpersonal inventory of important past and present relationships; develop a case formulation linking depression to grief, interpersonal role disputes, role transitions, and/or interpersonal deficits). Objective Learn and implement problem-solving and decision-making skills. Target Date: 2024-10-18 Frequency: Monthly  Progress: 60 Modality: individual  Related Interventions Conduct Problem-Solving Therapy using techniques such as psychoeducation, modeling, and role-playing to teach client problem-solving skills (i.e., defining a problem specifically, generating possible solutions, evaluating the pros and cons of each solution, selecting and implementing a plan of action, evaluating the efficacy of the plan, accepting or revising the plan); role-play application of the problem-solving skill to a real life issue. Encourage in the client the development of a positive problem orientation in which problems and solving them are viewed as a natural part of life and not something to be feared, despaired, or avoided. 3. Develop healthy interpersonal relationships that lead to the alleviation and help prevent the relapse of depression. 4. Develop healthy thinking patterns and beliefs about self, others, and the world that lead to the alleviation and help prevent the relapse of depression. 5. Recognize, accept, and cope with feelings of depression. Diagnosis F33.1  Conditions For Discharge Achievement of treatment goals and objectives   [name], LCSW

## 2024-03-06 ENCOUNTER — Other Ambulatory Visit: Payer: Self-pay | Admitting: Psychiatry

## 2024-03-06 DIAGNOSIS — F251 Schizoaffective disorder, depressive type: Secondary | ICD-10-CM

## 2024-03-06 DIAGNOSIS — F429 Obsessive-compulsive disorder, unspecified: Secondary | ICD-10-CM

## 2024-03-07 ENCOUNTER — Ambulatory Visit (INDEPENDENT_AMBULATORY_CARE_PROVIDER_SITE_OTHER): Admitting: Psychology

## 2024-03-07 DIAGNOSIS — F331 Major depressive disorder, recurrent, moderate: Secondary | ICD-10-CM

## 2024-03-07 NOTE — Progress Notes (Signed)
 [facility] Counselor/Therapist Progress Note  Patient ID: Jeanne [name], MRN: [id number],    Date: 03/07/2024  Time Spent: 2:00pm-2:45pm   45 minutes   Treatment Type: Individual Therapy  Reported Symptoms: stress, disappointment  Mental Status Exam: Appearance:  Casual     Behavior: Appropriate  Motor: Normal  Speech/Language:  Normal Rate  Affect: Appropriate  Mood: normal  Thought process: normal  Thought content:   WNL  Sensory/Perceptual disturbances:   WNL  Orientation: oriented to person, place, time/date, and situation  Attention: Good  Concentration: Good  Memory: WNL  Fund of knowledge:  Good  Insight:   Good  Judgment:  Good  Impulse Control: Good   Risk Assessment: Danger to Self:  No Self-injurious Behavior: No Danger to Others: No Duty to Warn:no Physical Aggression / Violence:No  Access to Firearms a concern: No  Gang Involvement:No   Subjective:  Pt present for face-to-face individual in person. 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  She has been in physical therapy twice a week and is progressing.   Pt talked about her sisters.  They did not step up with helping her with her post op care.  Pt is very disappointed.  Pt states her role in the family is the giver.  Pt knows she would support them a lot more if the tables were turned.  Helped pt process her feelings and family dynamics.  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  This year the church is working on trying to improve their finances.  They only have 94 members.  Pt will work on trying to shepard the congregation through conversations about money.  Pt feels like she wants to and would be good at leading this conversation. Pt is hosting Thanksgiving at her house and will do most of the cooking.  There will be about 20 family members attending.   Worked on self care strategies.   Provided supportive therapy.  Interventions: Cognitive  Behavioral Therapy and Insight-Oriented  Diagnosis: F33.1  Plan of Care: Recommend ongoing therapy.   Pt participated in setting treatment goals.  Plan to continue to meet monthly.  Pt agrees with treatment plan.   Treatment Plan  (Treatment Plan Target Date:  10/18/2024 ) Client Abilities/Strengths  Pt is bright, engaging, and motivated for therapy.   Client Treatment Preferences  Individual therapy.  Client Statement of Needs  Improve coping skills.  Symptoms  Depressed or irritable mood.  Problems Addressed  Unipolar Depression Goals 1. Alleviate depressive symptoms and return to previous level of effective functioning. 2. Appropriately grieve the loss in order to normalize mood and to return to previously adaptive level of functioning. Objective Learn and implement behavioral strategies to overcome depression. Target Date: 2024-10-18 Frequency: Monthly  Progress: 60 Modality: individual  Related Interventions Engage the client in behavioral activation, increasing his/her activity level and contact with sources of reward, while identifying processes that inhibit activation.  Use behavioral techniques such as instruction, rehearsal, role-playing, role reversal, as needed, to facilitate activity in the client's daily life; reinforce success. Assist the client in developing skills that increase the likelihood of deriving pleasure from behavioral activation (e.g., assertiveness skills, developing an exercise plan, less internal/more external focus, increased social involvement); reinforce success. Objective Identify important people in life, past and present, and describe the quality, good and poor, of those relationships. Target Date: 2024-10-18 Frequency: Monthly  Progress: 60 Modality: individual  Related Interventions Conduct Interpersonal Therapy beginning with  the assessment of the client's interpersonal inventory of important past and present relationships; develop a case  formulation linking depression to grief, interpersonal role disputes, role transitions, and/or interpersonal deficits). Objective Learn and implement problem-solving and decision-making skills. Target Date: 2024-10-18 Frequency: Monthly  Progress: 60 Modality: individual  Related Interventions Conduct Problem-Solving Therapy using techniques such as psychoeducation, modeling, and role-playing to teach client problem-solving skills (i.e., defining a problem specifically, generating possible solutions, evaluating the pros and cons of each solution, selecting and implementing a plan of action, evaluating the efficacy of the plan, accepting or revising the plan); role-play application of the problem-solving skill to a real life issue. Encourage in the client the development of a positive problem orientation in which problems and solving them are viewed as a natural part of life and not something to be feared, despaired, or avoided. 3. Develop healthy interpersonal relationships that lead to the alleviation and help prevent the relapse of depression. 4. Develop healthy thinking patterns and beliefs about self, others, and the world that lead to the alleviation and help prevent the relapse of depression. 5. Recognize, accept, and cope with feelings of depression. Diagnosis F33.1  Conditions For Discharge Achievement of treatment goals and objectives   [name], LCSW

## 2024-03-29 ENCOUNTER — Encounter (HOSPITAL_BASED_OUTPATIENT_CLINIC_OR_DEPARTMENT_OTHER): Payer: Self-pay | Admitting: Cardiovascular Disease

## 2024-03-31 ENCOUNTER — Other Ambulatory Visit: Payer: Self-pay | Admitting: Psychiatry

## 2024-03-31 DIAGNOSIS — F251 Schizoaffective disorder, depressive type: Secondary | ICD-10-CM

## 2024-04-04 ENCOUNTER — Ambulatory Visit: Admitting: Psychology

## 2024-04-04 DIAGNOSIS — F331 Major depressive disorder, recurrent, moderate: Secondary | ICD-10-CM | POA: Diagnosis not present

## 2024-04-04 NOTE — Progress Notes (Signed)
 [facility] Counselor/Therapist Progress Note  Patient ID: Jeanne [name], MRN: [id number],    Date: 04/04/2024  Time Spent: 2:00pm-2:45pm   45 minutes   Treatment Type: Individual Therapy  Reported Symptoms: stress, worrying  Mental Status Exam: Appearance:  Casual     Behavior: Appropriate  Motor: Normal  Speech/Language:  Normal Rate  Affect: Appropriate  Mood: normal  Thought process: normal  Thought content:   WNL  Sensory/Perceptual disturbances:   WNL  Orientation: oriented to person, place, time/date, and situation  Attention: Good  Concentration: Good  Memory: WNL  Fund of knowledge:  Good  Insight:   Good  Judgment:  Good  Impulse Control: Good   Risk Assessment: Danger to Self:  No Self-injurious Behavior: No Danger to Others: No Duty to Warn:no Physical Aggression / Violence:No  Access to Firearms a concern: No  Gang Involvement:No   Subjective:  Pt present for face-to-face individual in person. 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  This year the church  has to do an evaluation of their pastor Rea.  The evaluations were not good.  Pt has to be the bearer of bad news to [facility].  Pt is worried about how the conversation will go.   The congregation is not sure if they will keep Rea on as pastor.   Pt talked about her health.  She has lost 63 lbs and feels much better about her weight but wants to lose at least 15 more lbs.  This final weight loss is harder and pt feels frustrated. Pt talked about her nephew [name] who lives with her.   Pt's sister Heron gives pt $200 a month for [name] to live there.   Pt is giving [name] until March to move out.  But she is worried about losing the $200 a month.   Pt worries a lot about finances.   She needs dentures but can't afford them.  Pt is going to have a talk with her sisters about helping her financially but is worried about how the conversation  will go.  Helped pt process her feelings and family dynamics.  Worked on self care strategies.   Provided supportive therapy.  Interventions: Cognitive Behavioral Therapy and Insight-Oriented  Diagnosis: F33.1  Plan of Care: Recommend ongoing therapy.   Pt participated in setting treatment goals.  Plan to continue to meet monthly.  Pt agrees with treatment plan.   Treatment Plan  (Treatment Plan Target Date:  10/18/2024 ) Client Abilities/Strengths  Pt is bright, engaging, and motivated for therapy.   Client Treatment Preferences  Individual therapy.  Client Statement of Needs  Improve coping skills.  Symptoms  Depressed or irritable mood.  Problems Addressed  Unipolar Depression Goals 1. Alleviate depressive symptoms and return to previous level of effective functioning. 2. Appropriately grieve the loss in order to normalize mood and to return to previously adaptive level of functioning. Objective Learn and implement behavioral strategies to overcome depression. Target Date: 2024-10-18 Frequency: Monthly  Progress: 60 Modality: individual  Related Interventions Engage the client in behavioral activation, increasing his/her activity level and contact with sources of reward, while identifying processes that inhibit activation.  Use behavioral techniques such as instruction, rehearsal, role-playing, role reversal, as needed, to facilitate activity in the client's daily life; reinforce success. Assist the client in developing skills that increase the likelihood of deriving pleasure from behavioral activation (e.g., assertiveness skills, developing an exercise plan, less  internal/more external focus, increased social involvement); reinforce success. Objective Identify important people in life, past and present, and describe the quality, good and poor, of those relationships. Target Date: 2024-10-18 Frequency: Monthly  Progress: 60 Modality: individual  Related Interventions Conduct  Interpersonal Therapy beginning with the assessment of the client's interpersonal inventory of important past and present relationships; develop a case formulation linking depression to grief, interpersonal role disputes, role transitions, and/or interpersonal deficits). Objective Learn and implement problem-solving and decision-making skills. Target Date: 2024-10-18 Frequency: Monthly  Progress: 60 Modality: individual  Related Interventions Conduct Problem-Solving Therapy using techniques such as psychoeducation, modeling, and role-playing to teach client problem-solving skills (i.e., defining a problem specifically, generating possible solutions, evaluating the pros and cons of each solution, selecting and implementing a plan of action, evaluating the efficacy of the plan, accepting or revising the plan); role-play application of the problem-solving skill to a real life issue. Encourage in the client the development of a positive problem orientation in which problems and solving them are viewed as a natural part of life and not something to be feared, despaired, or avoided. 3. Develop healthy interpersonal relationships that lead to the alleviation and help prevent the relapse of depression. 4. Develop healthy thinking patterns and beliefs about self, others, and the world that lead to the alleviation and help prevent the relapse of depression. 5. Recognize, accept, and cope with feelings of depression. Diagnosis F33.1  Conditions For Discharge Achievement of treatment goals and objectives   [name], LCSW

## 2024-04-09 ENCOUNTER — Other Ambulatory Visit: Payer: Self-pay | Admitting: Psychiatry

## 2024-04-09 DIAGNOSIS — F251 Schizoaffective disorder, depressive type: Secondary | ICD-10-CM

## 2024-05-02 ENCOUNTER — Ambulatory Visit: Admitting: Psychology

## 2024-05-02 DIAGNOSIS — F331 Major depressive disorder, recurrent, moderate: Secondary | ICD-10-CM | POA: Diagnosis not present

## 2024-05-02 NOTE — Progress Notes (Signed)
 "   [facility] Counselor/Therapist Progress Note  Patient ID: Jeanne [name], MRN: [id number],    Date: 05/02/2024  Time Spent: 2:00pm-2:45pm   45 minutes   Treatment Type: Individual Therapy  Reported Symptoms: stress, worrying  Mental Status Exam: Appearance:  Casual     Behavior: Appropriate  Motor: Normal  Speech/Language:  Normal Rate  Affect: Appropriate  Mood: normal  Thought process: normal  Thought content:   WNL  Sensory/Perceptual disturbances:   WNL  Orientation: oriented to person, place, time/date, and situation  Attention: Good  Concentration: Good  Memory: WNL  Fund of knowledge:  Good  Insight:   Good  Judgment:  Good  Impulse Control: Good   Risk Assessment: Danger to Self:  No Self-injurious Behavior: No Danger to Others: No Duty to Warn:no Physical Aggression / Violence:No  Access to Firearms a concern: No  Gang Involvement:No   Subjective:  Pt present for face-to-face individual in person. Pt talked about giving her pastor the evaluations which were not positive.  Pt had to facilitate the feedback meeting.  Pt tried not to be hard lined but was honest with the feedback.  The issues were breaking confidentiality,  trash talking people, not signing her time sheets on time, and not providing pastoral care, and lying.  The pastor did not respond to the feedback and pt is concerned the other meeting members are not happy with pt.  Pt plans to talk to them.   The pastor has 3 months to make improvements or she could be fired.   Pt made an appointment at [facility] to look into getting dentures.   They will cost about $5,000.00.   Pt is worried about the money.   She plans to talk to her sisters to help her financially.  It is hard for pt to ask for help.  Pt states it is hard to be the poor one in the family.  Pt talked about having trouble staying connected with friends who are not local.   Pt states she does not have the band  width to follow through with the connections.   Addressed the issues and worked with pt on setting the intention to connect with the one friend who is the easiest to connect with. Pt gave her nephew until the end of March to move out.  He has not acknowledged pt which is frustrating.  Worked on self care strategies.   Provided supportive therapy.  Interventions: Cognitive Behavioral Therapy and Insight-Oriented  Diagnosis: F33.1  Plan of Care: Recommend ongoing therapy.   Pt participated in setting treatment goals.  Plan to continue to meet monthly.  Pt agrees with treatment plan.   Treatment Plan  (Treatment Plan Target Date:  10/18/2024 ) Client Abilities/Strengths  Pt is bright, engaging, and motivated for therapy.   Client Treatment Preferences  Individual therapy.  Client Statement of Needs  Improve coping skills.  Symptoms  Depressed or irritable mood.  Problems Addressed  Unipolar Depression Goals 1. Alleviate depressive symptoms and return to previous level of effective functioning. 2. Appropriately grieve the loss in order to normalize mood and to return to previously adaptive level of functioning. Objective Learn and implement behavioral strategies to overcome depression. Target Date: 2024-10-18 Frequency: Monthly  Progress: 60 Modality: individual  Related Interventions Engage the client in behavioral activation, increasing his/her activity level and contact with sources of reward, while identifying processes that inhibit activation.  Use behavioral techniques such as instruction, rehearsal, role-playing, role reversal,  as needed, to facilitate activity in the client's daily life; reinforce success. Assist the client in developing skills that increase the likelihood of deriving pleasure from behavioral activation (e.g., assertiveness skills, developing an exercise plan, less internal/more external focus, increased social involvement); reinforce  success. Objective Identify important people in life, past and present, and describe the quality, good and poor, of those relationships. Target Date: 2024-10-18 Frequency: Monthly  Progress: 60 Modality: individual  Related Interventions Conduct Interpersonal Therapy beginning with the assessment of the client's interpersonal inventory of important past and present relationships; develop a case formulation linking depression to grief, interpersonal role disputes, role transitions, and/or interpersonal deficits). Objective Learn and implement problem-solving and decision-making skills. Target Date: 2024-10-18 Frequency: Monthly  Progress: 60 Modality: individual  Related Interventions Conduct Problem-Solving Therapy using techniques such as psychoeducation, modeling, and role-playing to teach client problem-solving skills (i.e., defining a problem specifically, generating possible solutions, evaluating the pros and cons of each solution, selecting and implementing a plan of action, evaluating the efficacy of the plan, accepting or revising the plan); role-play application of the problem-solving skill to a real life issue. Encourage in the client the development of a positive problem orientation in which problems and solving them are viewed as a natural part of life and not something to be feared, despaired, or avoided. 3. Develop healthy interpersonal relationships that lead to the alleviation and help prevent the relapse of depression. 4. Develop healthy thinking patterns and beliefs about self, others, and the world that lead to the alleviation and help prevent the relapse of depression. 5. Recognize, accept, and cope with feelings of depression. Diagnosis F33.1  Conditions For Discharge Achievement of treatment goals and objectives   [name], LCSW   "

## 2024-05-30 ENCOUNTER — Ambulatory Visit: Admitting: Psychology

## 2024-05-31 ENCOUNTER — Ambulatory Visit: Admitting: Psychiatry

## 2024-06-27 ENCOUNTER — Ambulatory Visit: Admitting: Psychology
